# Patient Record
Sex: Female | Born: 1955 | Race: White | Hispanic: No | Marital: Married | Smoking: Former smoker
Health system: Southern US, Community
[De-identification: ages and names within clinical notes are randomized; demographics above are authoritative.]

## PROBLEM LIST (undated history)

## (undated) DIAGNOSIS — I10 Essential (primary) hypertension: Secondary | ICD-10-CM

## (undated) DIAGNOSIS — M1990 Unspecified osteoarthritis, unspecified site: Secondary | ICD-10-CM

## (undated) DIAGNOSIS — C801 Malignant (primary) neoplasm, unspecified: Secondary | ICD-10-CM

## (undated) HISTORY — DX: Essential (primary) hypertension: I10

## (undated) HISTORY — PX: APPENDECTOMY: SHX54

## (undated) HISTORY — DX: Unspecified osteoarthritis, unspecified site: M19.90

---

## 2003-11-27 ENCOUNTER — Ambulatory Visit: Payer: Self-pay | Admitting: Internal Medicine

## 2005-03-08 ENCOUNTER — Ambulatory Visit: Payer: Self-pay | Admitting: Internal Medicine

## 2005-05-04 ENCOUNTER — Ambulatory Visit: Payer: Self-pay | Admitting: Gastroenterology

## 2008-11-14 ENCOUNTER — Ambulatory Visit: Payer: Self-pay | Admitting: Internal Medicine

## 2012-09-25 ENCOUNTER — Emergency Department: Payer: Self-pay | Admitting: Emergency Medicine

## 2012-09-25 LAB — URINALYSIS, COMPLETE
Bacteria: NONE SEEN
Bilirubin,UR: NEGATIVE
Blood: NEGATIVE
Glucose,UR: NEGATIVE mg/dL (ref 0–75)
Ketone: NEGATIVE
Nitrite: NEGATIVE
Ph: 6 (ref 4.5–8.0)
Protein: NEGATIVE
Specific Gravity: 1.008 (ref 1.003–1.030)
WBC UR: NONE SEEN /HPF (ref 0–5)

## 2013-06-27 DIAGNOSIS — R110 Nausea: Secondary | ICD-10-CM | POA: Insufficient documentation

## 2013-06-27 DIAGNOSIS — R112 Nausea with vomiting, unspecified: Secondary | ICD-10-CM | POA: Insufficient documentation

## 2013-07-18 ENCOUNTER — Ambulatory Visit: Payer: Self-pay | Admitting: Nurse Practitioner

## 2013-07-27 ENCOUNTER — Ambulatory Visit: Payer: Self-pay | Admitting: Nurse Practitioner

## 2017-01-14 ENCOUNTER — Other Ambulatory Visit: Payer: Self-pay | Admitting: Nurse Practitioner

## 2017-01-14 DIAGNOSIS — Z1231 Encounter for screening mammogram for malignant neoplasm of breast: Secondary | ICD-10-CM

## 2017-02-18 DIAGNOSIS — R7402 Elevation of levels of lactic acid dehydrogenase (LDH): Secondary | ICD-10-CM | POA: Insufficient documentation

## 2018-08-02 DIAGNOSIS — M5136 Other intervertebral disc degeneration, lumbar region: Secondary | ICD-10-CM | POA: Insufficient documentation

## 2018-08-02 DIAGNOSIS — M51369 Other intervertebral disc degeneration, lumbar region without mention of lumbar back pain or lower extremity pain: Secondary | ICD-10-CM | POA: Insufficient documentation

## 2018-09-07 ENCOUNTER — Other Ambulatory Visit: Payer: Self-pay | Admitting: Nurse Practitioner

## 2018-09-07 DIAGNOSIS — M5010 Cervical disc disorder with radiculopathy, unspecified cervical region: Secondary | ICD-10-CM

## 2018-09-20 ENCOUNTER — Ambulatory Visit: Payer: Self-pay

## 2019-01-05 HISTORY — PX: COLOSTOMY: SHX63

## 2019-05-24 ENCOUNTER — Ambulatory Visit: Payer: Self-pay | Attending: Internal Medicine

## 2019-05-24 DIAGNOSIS — Z23 Encounter for immunization: Secondary | ICD-10-CM

## 2019-05-24 NOTE — Progress Notes (Signed)
   Covid-19 Vaccination Clinic  Name:  Jeanette Allen    MRN: [id number] DOB: 01-03-1956  05/24/2019  Jeanette Allen was observed post Covid-19 immunization for 15 minutes without incident. She was provided with Vaccine Information Sheet and instruction to access the V-Safe system.   Jeanette Allen was instructed to call 911 with any severe reactions post vaccine: Marland Kitchen Difficulty breathing  . Swelling of face and throat  . A fast heartbeat  . A bad rash all over body  . Dizziness and weakness   Immunizations Administered    Name Date Dose VIS Date Route   Moderna COVID-19 Vaccine 05/24/2019  8:03 AM 0.5 mL 12/2018 Intramuscular   Manufacturer: Moderna   Lot: [id number]   [place][id number]

## 2019-06-21 ENCOUNTER — Ambulatory Visit: Payer: Medicaid Other | Attending: Internal Medicine

## 2019-06-21 DIAGNOSIS — Z23 Encounter for immunization: Secondary | ICD-10-CM

## 2019-06-21 NOTE — Progress Notes (Signed)
   Covid-19 Vaccination Clinic  Name:  Jeanette Allen    MRN: [id number] DOB: 1955-08-12  06/21/2019  [name] was observed post Covid-19 immunization for 15 minutes without incident. She was provided with Vaccine Information Sheet and instruction to access the V-Safe system.   [name] was instructed to call 911 with any severe reactions post vaccine: Marland Kitchen Difficulty breathing  . Swelling of face and throat  . A fast heartbeat  . A bad rash all over body  . Dizziness and weakness   Immunizations Administered    Name Date Dose VIS Date Route   Moderna COVID-19 Vaccine 06/21/2019  7:58 AM 0.5 mL 12/2018 Intramuscular   Manufacturer: Moderna   Lot: [id number]   [place]: [id number]

## 2019-08-13 DIAGNOSIS — B1710 Acute hepatitis C without hepatic coma: Secondary | ICD-10-CM | POA: Insufficient documentation

## 2019-08-26 ENCOUNTER — Other Ambulatory Visit: Payer: Self-pay | Admitting: General Surgery

## 2019-08-26 DIAGNOSIS — C569 Malignant neoplasm of unspecified ovary: Secondary | ICD-10-CM

## 2019-08-27 ENCOUNTER — Telehealth: Payer: Self-pay

## 2019-08-27 ENCOUNTER — Ambulatory Visit
Admission: RE | Admit: 2019-08-27 | Discharge: 2019-08-27 | Disposition: A | Discharge status: Home / Self Care | Payer: 59 | Source: Ambulatory Visit | Attending: Nurse Practitioner | Admitting: Nurse Practitioner

## 2019-08-27 ENCOUNTER — Other Ambulatory Visit: Payer: Self-pay

## 2019-08-27 DIAGNOSIS — R971 Elevated cancer antigen 125 [CA 125]: Secondary | ICD-10-CM

## 2019-08-27 DIAGNOSIS — R188 Other ascites: Secondary | ICD-10-CM

## 2019-08-27 LAB — BODY FLUID CELL COUNT WITH DIFFERENTIAL
Eos, Fluid: 0 %
Lymphs, Fluid: 33 %
Monocyte-Macrophage-Serous Fluid: 25 %
Neutrophil Count, Fluid: 42 %
Total Nucleated Cell Count, Fluid: 3035 cu mm

## 2019-08-27 LAB — LACTATE DEHYDROGENASE, PLEURAL OR PERITONEAL FLUID: LD, Fluid: 334 U/L — ABNORMAL HIGH (ref 3–23)

## 2019-08-27 LAB — PROTEIN, PLEURAL OR PERITONEAL FLUID: Total protein, fluid: 5 g/dL

## 2019-08-27 LAB — ALBUMIN, PLEURAL OR PERITONEAL FLUID: Albumin, Fluid: 2.8 g/dL

## 2019-08-27 LAB — GLUCOSE, PLEURAL OR PERITONEAL FLUID: Glucose, Fluid: 98 mg/dL

## 2019-08-27 NOTE — Telephone Encounter (Addendum)
Call placed to Jeanette Allen post paracentesis. 1272ml removed and sent for cytology. She reports her breathing is much improved. Hepatitis C noted. We arranged gyn oncology appointment for 8-25 at 1000.DIrections to the cancer center given. Went over what to expect during the consult. Provided my contact information for future needs/questions.

## 2019-08-27 NOTE — Procedures (Signed)
PROCEDURE SUMMARY:  Successful US guided paracentesis from RLQ.  Yielded 1250 mL of clear amber fluid.  No immediate complications.  Pt tolerated well.   Specimen was sent for labs.  EBL < 8mL  Ascencion Dike PA-C 08/27/2019 3:22 PM

## 2019-08-27 NOTE — Telephone Encounter (Signed)
Received referral from Dr. Bary Castilla for elevated CA 125. CT scan and [place] has been performed. We will obtain this and get it uploaded to our system. Spoke with Dr. Fransisca Connors and Ms. Gettinger this am. She will have a paracentesis for ascites/cytology today. She reports shortness of breath. If paracentesis does not improve breathing we will obtain a chest CT. I will follow up with her later today following paracentesis. Plan for clinic visit 8/25.

## 2019-08-28 ENCOUNTER — Ambulatory Visit
Admission: RE | Admit: 2019-08-28 | Discharge: 2019-08-28 | Disposition: A | Discharge status: Home / Self Care | Payer: Self-pay | Source: Ambulatory Visit | Attending: Nurse Practitioner | Admitting: Nurse Practitioner

## 2019-08-28 ENCOUNTER — Other Ambulatory Visit: Payer: Self-pay

## 2019-08-28 DIAGNOSIS — R971 Elevated cancer antigen 125 [CA 125]: Secondary | ICD-10-CM

## 2019-08-28 DIAGNOSIS — R188 Other ascites: Secondary | ICD-10-CM

## 2019-08-29 ENCOUNTER — Other Ambulatory Visit: Payer: Self-pay

## 2019-08-29 ENCOUNTER — Inpatient Hospital Stay: Payer: 59 | Attending: Obstetrics and Gynecology | Admitting: Obstetrics and Gynecology

## 2019-08-29 ENCOUNTER — Inpatient Hospital Stay: Payer: 59

## 2019-08-29 VITALS — BP 141/89 | HR 104 | Temp 97.8°F | Resp 20 | Wt 160.4 lb

## 2019-08-29 DIAGNOSIS — R188 Other ascites: Secondary | ICD-10-CM | POA: Diagnosis not present

## 2019-08-29 DIAGNOSIS — R971 Elevated cancer antigen 125 [CA 125]: Secondary | ICD-10-CM

## 2019-08-29 DIAGNOSIS — I10 Essential (primary) hypertension: Secondary | ICD-10-CM | POA: Insufficient documentation

## 2019-08-29 DIAGNOSIS — R140 Abdominal distension (gaseous): Secondary | ICD-10-CM | POA: Diagnosis not present

## 2019-08-29 DIAGNOSIS — B1710 Acute hepatitis C without hepatic coma: Secondary | ICD-10-CM

## 2019-08-29 DIAGNOSIS — I2510 Atherosclerotic heart disease of native coronary artery without angina pectoris: Secondary | ICD-10-CM | POA: Insufficient documentation

## 2019-08-29 DIAGNOSIS — B1920 Unspecified viral hepatitis C without hepatic coma: Secondary | ICD-10-CM | POA: Diagnosis not present

## 2019-08-29 DIAGNOSIS — K219 Gastro-esophageal reflux disease without esophagitis: Secondary | ICD-10-CM | POA: Insufficient documentation

## 2019-08-29 DIAGNOSIS — R6881 Early satiety: Secondary | ICD-10-CM | POA: Diagnosis not present

## 2019-08-29 DIAGNOSIS — E785 Hyperlipidemia, unspecified: Secondary | ICD-10-CM | POA: Insufficient documentation

## 2019-08-29 DIAGNOSIS — E559 Vitamin D deficiency, unspecified: Secondary | ICD-10-CM | POA: Insufficient documentation

## 2019-08-29 DIAGNOSIS — F32A Depression, unspecified: Secondary | ICD-10-CM | POA: Insufficient documentation

## 2019-08-29 DIAGNOSIS — E538 Deficiency of other specified B group vitamins: Secondary | ICD-10-CM | POA: Insufficient documentation

## 2019-08-29 DIAGNOSIS — Z720 Tobacco use: Secondary | ICD-10-CM | POA: Insufficient documentation

## 2019-08-29 DIAGNOSIS — R599 Enlarged lymph nodes, unspecified: Secondary | ICD-10-CM

## 2019-08-29 DIAGNOSIS — I349 Nonrheumatic mitral valve disorder, unspecified: Secondary | ICD-10-CM | POA: Insufficient documentation

## 2019-08-29 LAB — PH, BODY FLUID: pH, Body Fluid: 7.4

## 2019-08-29 LAB — APTT: aPTT: 30 seconds (ref 24–36)

## 2019-08-29 LAB — PROTIME-INR
INR: 1 (ref 0.8–1.2)
Prothrombin Time: 12.9 seconds (ref 11.4–15.2)

## 2019-08-29 NOTE — Progress Notes (Signed)
Patient states that she is having really bad burping spells and also when she takes a deep breathe she is having a gurgling feeling on the left side of stomach. Patient also stated that she is getting full fast.

## 2019-08-29 NOTE — Progress Notes (Addendum)
Gynecologic Oncology Consult Visit   Referring Provider: Dr. Bary Castilla  Chief Concern: elevated CA125 and ascites  Subjective:  Jeanette Allen is a 64 y.o. female who is seen in consultation from Dr. Charlynn Grimes and Bary Castilla for elevated CA125 and ascites. She has a medical history significant for Hepatitis C (ectopic pregnancy in 1990 requiring transfusion).   She presented with abdominal pain for 2-3 weeks, daily nausea (episode of emesis of CT contrast). She also has early satiety and abdominal bloating.   .  08/20/2019 US Abdomen  Cirrhotic liver with small amount of perihepatic ascites.     Multiple peripancreatic and perihepatic hypoechoic nodules, the largest of which measures 2.0 cm at the inferior liver edge. Recommend CT for further evaluation.    Please see below for data measurements:    Liver: 15.0 cm     Common hepatic duct: 0.20 cm  Proximal common bile duct: 0.26 cm  Distal common bile duct: 0.42 cm    Gallbladder wall: 0.47 cm   Sonographic Murphy's Sign: negative  Pericholecystic fluid visualized: no    Right kidney: 10.1 cm   Left kidney: 10.9 cm     Aorta: not well visualized  Inferior vena cava: not well visualized    Spleen: 9.6 cm   [facility] RAD    US Abdomen Complete (08/20/2019 7:28 PM EDT)  Narrative Performed At  EXAM: US ABDOMEN COMPLETE  DATE: 08/20/2019 7:28 PM  ACCESSION: [id number]  DICTATED: 08/20/2019 7:30 PM  INTERPRETATION LOCATION: [facility]    CLINICAL INDICATION: 64 years old Female with reported h/o cirrhosis, abd distention     TECHNIQUE: Static and cine images of the complete abdomen were performed.    COMPARISON: None    FINDINGS:     LIVER: The liver is increased echogenicity with nodular contour. No focal hepatic lesions. No intrahepatic biliary ductal dilatation. The common bile duct is normal in caliber. Small amount of perihepatic ascites.    GALLBLADDER: The gallbladder is  partially contracted with thickening of the gallbladder wall. Sonographic Percell Miller sign is negative. No pericholecystic fluid.     PANCREAS: Visualized portion is unremarkable.    SPLEEN: Normal in size and echotexture.    KIDNEYS: Normal in size and echotexture. No solid masses or calculi. No hydronephrosis.    VESSELS: Not well visualized.     OTHER: Multiple peripancreatic and perihepatic hypoechoic nodules. The largest of which measures 2.0 x 1.3 x 1.4 cm at the inferior edge of the liver.        08/22/2019 The patient underwent a contrast-enhanced CT. This described possibility of gas around the gallbladder. Cholecystitis is a consideration. In light of the ultrasound 2 days earlier I think this is unlikely. Extensive abdominal and pelvic ascites. No retroperitoneal masses. No evidence of an acute inflammatory process. Suspected uterine fibroid. No free air. We do not have the radiology report.    08/23/2019 She saw Dr. Bary Castilla for evaluation given gallbladder findings.   Labs: elevated hepatitis C virus antibody. Liver function with elevated transaminases. Normal total bilirubin, albumin and electrolytes. Normal renal function. CBC showed a hemoglobin of 15.6 with an MCV of 95, white blood cell count of 6500 with normal differential. Hemoglobin A1c 5.5.  CA125= 1228; CEA= 2.1; alpha-fetoprotein = 2,2, and CA19-9 <2.    08/27/2019 Paracentesis total of approximately 1250 mL of clear, amber colored fluid was Removed. LDH 334; ANC42; total nucleated cell 3,035; No WBC; No organisms; No growth; Albumin 2.8; total protein 5.0; Glucose 98; pH  pending.  Cytology still in process.   She presents today for evaluation.   COVID vaccination 05/24/2019 and 06/21/2019  Problem List: Patient Active Problem List   Diagnosis Date Noted  . Hepatitis C infection 08/29/2019  . CAD (coronary artery disease) 08/29/2019  . Depression 08/29/2019  . Esophageal reflux 08/29/2019  .  Hyperlipidemia 08/29/2019  . Hypertension 08/29/2019  . Nonrheumatic mitral valve disorder 08/29/2019  . Vitamin B12 deficiency 08/29/2019  . Vitamin D deficiency 08/29/2019  . Tobacco abuse 08/29/2019    Past Medical History: Past Medical History:  Diagnosis Date  . Arthritis   . Hypertension     Past Surgical History: Past Surgical History:  Procedure Laterality Date  . APPENDECTOMY    colonoscopy 2011 Tubal pregnancy 1990 Tubal reversal  Past Gynecologic History:  Menarche: age 7 Last menstrual period: age 38   OB History:  OB History  Gravida Para Term Preterm AB Living  4 3     1     SAB TAB Ectopic Multiple Live Births      1        # Outcome Date GA Lbr Len/2nd Weight Sex Delivery Anes PTL Lv  4 Ectopic           3 Para           2 Para           1 Para             Family History: Family History  Problem Relation Age of Onset  . Diabetes Maternal Grandmother   . Hypertension Maternal Grandmother   . Stroke Maternal Grandmother     Social History: Social History   Socioeconomic History  . Marital status: Married    Spouse name: Not on file  . Number of children: Not on file  . Years of education: Not on file  . Highest education level: Not on file  Occupational History  . Not on file  Tobacco Use  . Smoking status: Current Every Day Smoker  . Smokeless tobacco: Never Used  Vaping Use  . Vaping Use: Never used  Substance and Sexual Activity  . Alcohol use: Not Currently  . Drug use: Not on file  . Sexual activity: Yes  Other Topics Concern  . Not on file  Social History Narrative  . Not on file   Social Determinants of Health   Financial Resource Strain:   . Difficulty of Paying Living Expenses: Not on file  Food Insecurity:   . Worried About Charity fundraiser in the Last Year: Not on file  . Ran Out of Food in the Last Year: Not on file  Transportation Needs:   . Lack of Transportation (Medical): Not on file  . Lack of  Transportation (Non-Medical): Not on file  Physical Activity:   . Days of Exercise per Week: Not on file  . Minutes of Exercise per Session: Not on file  Stress:   . Feeling of Stress : Not on file  Social Connections:   . Frequency of Communication with Friends and Family: Not on file  . Frequency of Social Gatherings with Friends and Family: Not on file  . Attends Religious Services: Not on file  . Active Member of Clubs or Organizations: Not on file  . Attends Archivist Meetings: Not on file  . Marital Status: Not on file  Intimate Partner Violence:   . Fear of Current or Ex-Partner: Not on file  . Emotionally  Abused: Not on file  . Physically Abused: Not on file  . Sexually Abused: Not on file    Allergies: No Known Allergies  Current Medications: Current Outpatient Medications  Medication Sig Dispense Refill  . amoxicillin (AMOXIL) 500 MG tablet Take 500 mg by mouth 2 (two) times daily.    Marland Kitchen dicyclomine (BENTYL) 10 MG/5ML solution Take by mouth 4 (four) times daily -  before meals and at bedtime.    . ergocalciferol (VITAMIN D2) 1.25 MG (50000 UT) capsule Take by mouth.    Marland Kitchen FLUoxetine (PROZAC) 20 MG capsule Take by mouth.    . losartan (COZAAR) 50 MG tablet Take by mouth.    . ondansetron (ZOFRAN-ODT) 4 MG disintegrating tablet Take 4 mg by mouth every 8 (eight) hours as needed for nausea or vomiting.    . Vitamin D, Ergocalciferol, (DRISDOL) 1.25 MG (50000 UNIT) CAPS capsule Take 50,000 Units by mouth once a week.     No current facility-administered medications for this visit.    General: negative for fevers, changes in weight or night sweats Skin: negative for changes in moles or sores or rash Eyes: negative for changes in vision HEENT: negative for change in hearing, tinnitus, voice changes Pulmonary: negative for dyspnea, orthopnea, productive cough, wheezing Cardiac: negative for palpitations, pain Gastrointestinal: negative for constipation,  diarrhea, hematemesis, hematochezia. Positive for nausea Genitourinary/Sexual: negative for dysuria, retention, hematuria, incontinence Ob/Gyn:  negative for abnormal bleeding, or pain Musculoskeletal: Positive for pain in arms and legs Hematology: negative for easy bruising, abnormal bleeding Neurologic/Psych: negative for headaches, seizures, paralysis, weakness, numbness   Objective:  Physical Examination:  BP (!) 141/89   Pulse (!) 104   Temp 97.8 F (36.6 C)   Resp 20   Wt 160 lb 6.4 oz (72.8 kg)   SpO2 100%    ECOG Performance Status: 1 - Symptomatic but completely ambulatory  GENERAL: Patient is a well appearing female in no acute distress HEENT:  PERRL, neck supple with midline trachea.  NODES:  No cervical, supraclavicular, axillary, or inguinal lymphadenopathy palpated.  LUNGS:  Clear to auscultation bilaterally.  No wheezes or rhonchi. HEART:  Regular rate and rhythm. No murmur appreciated. ABDOMEN: distended with obvious ascites; no obvious masses, unable to assess for hepatosplenomegaly. No obvious hernias.  MSK:  No focal spinal tenderness to palpation. Full range of motion bilaterally in the upper extremities. EXTREMITIES:  No peripheral edema.   SKIN:  Clear with no obvious rashes or skin changes.  NEURO:  Nonfocal. Well oriented.  Appropriate affect.  Pelvic: exam chaperoned by nurse;  Vulva: normal appearing vulva with no masses, tenderness or lesions; Vagina: normal vagina; Uterus: not grossly enlarged but limited exam due to ascites. BME: firm plaque like midline mass in the cul-de-sac measuring ~3 cm and firm, no adnexal mass appreciated; Cervix: no lesions, normal appearance; Rectal: not indicated and normal rectal, no masses  Lab Review Labs on site today: As per HPI                Radiologic Imaging: CT images reviewed with the patient. Requested report.     Assessment:  Jeanette Allen is a 64 y.o. female diagnosed with ascites, elevated  CA125, and cul-de-sac mass concerning for malignancy. However she also has a diagnosis of Hepatitis C and ascites may be due to liver disease.   Hepatitis C, elevated LFTs, normal bilirubin and albumin.   Medical co-morbidities complicating care: HTN and prior abdominal surgery and Hepatitis C.  Plan:  Problem List Items Addressed This Visit      Digestive   Hepatitis C infection   Relevant Orders   Ambulatory referral to Gastroenterology   Protime-INR    Other Visit Diagnoses    Elevated CA-125    -  Primary   Relevant Orders   Ambulatory referral to Gastroenterology   Protime-INR      We discussed the differential diagnosis. At this point more information is needed before a management recommendation can be given. We requested CT report. PT/PTT/INR ordered. Cytology pending, discussed with Pathology. Arrange for GI consultation for evaluation of Hepatitis C.   Suggested return to clinic in  1 week. Hopefully we will have more information and develop a management plan. .     All questions were answered to the patient's, her husband's and daughter's satisfaction.  A total of 80 minutes were spent with the patient/family today; >50% was spent in education, counseling and coordination of care for ascites, elevated CA125, and cul-de-sac mass.  Sanna Porcaro Gaetana Michaelis, MD    CC:  Danelle Berry, NP [address] [place],  [place] [address] [phone]  Dr. Job Founds

## 2019-08-30 ENCOUNTER — Telehealth (HOSPITAL_COMMUNITY): Payer: Self-pay | Admitting: Nurse Practitioner

## 2019-08-30 ENCOUNTER — Telehealth: Payer: Self-pay

## 2019-08-30 DIAGNOSIS — C763 Malignant neoplasm of pelvis: Secondary | ICD-10-CM

## 2019-08-30 LAB — BODY FLUID CULTURE
Culture: NO GROWTH
Gram Stain: NONE SEEN

## 2019-08-30 LAB — CYTOLOGY - NON PAP

## 2019-08-30 NOTE — Telephone Encounter (Signed)
Spoke to patient and her husband regarding pathology results which are consistent with high grade serous carcinoma. Will get Chest ct for staging. Not sufficient testing for ancillary testing. Jeanette Allen coordinating biopsy. Will send referral to medical oncology.

## 2019-08-30 NOTE — Telephone Encounter (Signed)
Probable areas of peritoneal and mesenteric adenopathy noted on CT read. Dr. Theora Gianotti would like a biopsy of this area. Order placed and invasive checklist faxed along with copy of CT report.

## 2019-08-31 ENCOUNTER — Telehealth: Payer: Self-pay

## 2019-08-31 ENCOUNTER — Other Ambulatory Visit: Payer: Self-pay | Admitting: Nurse Practitioner

## 2019-08-31 ENCOUNTER — Other Ambulatory Visit (HOSPITAL_COMMUNITY): Payer: Self-pay | Admitting: Nurse Practitioner

## 2019-08-31 DIAGNOSIS — C763 Malignant neoplasm of pelvis: Secondary | ICD-10-CM

## 2019-08-31 NOTE — Telephone Encounter (Signed)
Called and went over all upcoming appointments with Ms. Hulick. She has a CT guided biopsy scheduled, 09/07/19, at 1130 with arrival [facility] the [facility] mall at 1030. Reviewed again the process for biopsy and to bring a driver. Notified that an IR nurse will also call her a day or so before with any additional instructions. Medical Oncology appointment has been arranged with Dr. Rogue Bussing 8/31 at 1400. She is aware this is at the cancer center on the second floor. GI has been scheduled for 09/03/19 at 1345 with Dr. Marius Ditch for her hepatitis C. She continues with nausea without vomiting. She has contacted her PCP office and they gave her permission to increase the frequency of her Zofran. Her shortness of breath has not returned since paracentesis. She does report some shortness of breath during the nausea episodes but says that it subsides as the nausea does. Introduced our symptom management clinic to her. She can be arranged an in-person visit or a virtual visit as needed to assist with her symptoms. All questions answered. Encouraged to call with any further needs/questions.

## 2019-09-03 ENCOUNTER — Ambulatory Visit (INDEPENDENT_AMBULATORY_CARE_PROVIDER_SITE_OTHER): Payer: 59 | Admitting: Gastroenterology

## 2019-09-03 ENCOUNTER — Encounter: Payer: Self-pay | Admitting: Gastroenterology

## 2019-09-03 ENCOUNTER — Other Ambulatory Visit: Payer: Self-pay

## 2019-09-03 VITALS — BP 122/76 | HR 120 | Temp 98.0°F | Wt 163.0 lb

## 2019-09-03 DIAGNOSIS — C786 Secondary malignant neoplasm of retroperitoneum and peritoneum: Secondary | ICD-10-CM | POA: Diagnosis not present

## 2019-09-03 DIAGNOSIS — K7469 Other cirrhosis of liver: Secondary | ICD-10-CM | POA: Diagnosis not present

## 2019-09-03 DIAGNOSIS — C801 Malignant (primary) neoplasm, unspecified: Secondary | ICD-10-CM | POA: Diagnosis not present

## 2019-09-03 DIAGNOSIS — R768 Other specified abnormal immunological findings in serum: Secondary | ICD-10-CM

## 2019-09-03 NOTE — Progress Notes (Signed)
Cephas Darby, MD [address]  [place]  [place], [place] [address]  Main: [phone]  Fax: [phone]    Gastroenterology Consultation  Referring Provider:     Danelle Berry, NP Primary Care Physician:  Danelle Berry, NP Primary Gastroenterologist:  Dr. Cephas Darby Reason for Consultation:     Hepatitis C, cirrhosis        HPI:   Jeanette Allen is a 64 y.o. female referred by Dr. Danelle Berry, NP  for consultation & management of hep C, cirrhosis.  Patient is newly diagnosed with hepatitis C.  She was urgently seen by her PCP in April 2021 secondary to abdominal distention, fatigue.  She underwent ultrasound abdomen which revealed multiple peripancreatic and perihepatic hypoechoic nodules measuring up to 2 cm, ascites and cirrhosis.  Her HCV antibody was positive, HCV RNA quantitative was positive.  She did have mildly elevated AST and ALT.  Normal CBC, TSH, HbA1c. Subsequently, she underwent CT scan which revealed severe ascites and retroperitoneal masses as well as was gas around the gallbladder concerning for possible cholecystitis.  Patient was evaluated by Dr. Bary Castilla on 08/24/2019, checked for CEA, AFP, CA 19-9 which was normal, CA-125 was 1228. She underwent therapeutic paracentesis on 08/27/2019 with fluid analysis which confirmed high-grade serous carcinoma on cytology.  Total protein, albumin levels were normal.  There was no evidence of SBP.  Fluid LDH was elevated  Patient reports mild abdominal discomfort, in lower abdomen, diffuse abdominal discomfort, nausea She denies constipation, rectal bleeding, melena  Patient is referred to GI for management of hep C  She has an appointment with oncologist, Dr. Rogue Bussing tomorrow Patient does not smoke or drink alcohol She reports she received a blood transfusion when she had severe bleeding from ectopic pregnancy in 1990  NSAIDs: None  Antiplts/Anticoagulants/Anti thrombotics: None  GI Procedures:  Reports having had a colonoscopy around age 6, reportedly normal  Past Medical History:  Diagnosis Date  . Arthritis   . Hypertension     Past Surgical History:  Procedure Laterality Date  . APPENDECTOMY      Current Outpatient Medications:  .  dicyclomine (BENTYL) 10 MG/5ML solution, Take by mouth 4 (four) times daily -  before meals and at bedtime., Disp: , Rfl:  .  ergocalciferol (VITAMIN D2) 1.25 MG (50000 UT) capsule, Take by mouth., Disp: , Rfl:  .  FLUoxetine (PROZAC) 20 MG capsule, Take by mouth., Disp: , Rfl:  .  losartan (COZAAR) 50 MG tablet, Take by mouth., Disp: , Rfl:  .  ondansetron (ZOFRAN-ODT) 4 MG disintegrating tablet, Take 4 mg by mouth every 8 (eight) hours as needed for nausea or vomiting., Disp: , Rfl:    Family History  Problem Relation Age of Onset  . Diabetes Maternal Grandmother   . Hypertension Maternal Grandmother   . Stroke Maternal Grandmother      Social History   Tobacco Use  . Smoking status: Current Every Day Smoker  . Smokeless tobacco: Never Used  Vaping Use  . Vaping Use: Never used  Substance Use Topics  . Alcohol use: Not Currently  . Drug use: Not on file    Allergies as of 09/03/2019  . (No Known Allergies)    Review of Systems:    All systems reviewed and negative except where noted in HPI.   Physical Exam:  BP 122/76 (BP Location: Left Arm, Patient Position: Sitting)   Pulse (!) 120   Temp 98 F (36.7  C) (Oral)   Wt 163 lb (73.9 kg)  No LMP recorded.  General:   Alert,  Well-developed, well-nourished, pleasant and cooperative in NAD Head:  Normocephalic and atraumatic. Eyes:  Sclera clear, no icterus.   Conjunctiva pink. Ears:  Normal auditory acuity. Nose:  No deformity, discharge, or lesions. Mouth:  No deformity or lesions,oropharynx pink & moist. Neck:  Supple; no masses or thyromegaly. Lungs:  Respirations even and unlabored.  Clear throughout to auscultation.   No wheezes, crackles, or rhonchi. No acute  distress. Heart:  Regular rate and rhythm; no murmurs, clicks, rubs, or gallops. Abdomen:  Normal bowel sounds. Soft, mild diffuse lower abdominal tenderness, grossly distended, tympanic to percussion, without masses, hepatosplenomegaly or hernias noted.  No guarding or rebound tenderness.   Rectal: Not performed Msk:  Symmetrical without gross deformities. Good, equal movement & strength bilaterally. Pulses:  Normal pulses noted. Extremities:  No clubbing or edema.  No cyanosis. Neurologic:  Alert and oriented x3;  grossly normal neurologically. Skin:  Intact without significant lesions or rashes. No jaundice. Psych:  Alert and cooperative. Normal mood and affect.  Imaging Studies: Reviewed  Assessment and Plan:   Jeanette Allen is a 64 y.o. 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

## 2019-09-04 ENCOUNTER — Inpatient Hospital Stay: Payer: 59

## 2019-09-04 ENCOUNTER — Inpatient Hospital Stay (HOSPITAL_BASED_OUTPATIENT_CLINIC_OR_DEPARTMENT_OTHER): Payer: 59 | Admitting: Internal Medicine

## 2019-09-04 ENCOUNTER — Encounter: Payer: Self-pay | Admitting: Internal Medicine

## 2019-09-04 VITALS — BP 122/71 | HR 87 | Temp 96.9°F | Resp 16 | Ht 64.0 in | Wt 163.0 lb

## 2019-09-04 DIAGNOSIS — C55 Malignant neoplasm of uterus, part unspecified: Secondary | ICD-10-CM | POA: Insufficient documentation

## 2019-09-04 DIAGNOSIS — R188 Other ascites: Secondary | ICD-10-CM | POA: Diagnosis not present

## 2019-09-04 DIAGNOSIS — C579 Malignant neoplasm of female genital organ, unspecified: Secondary | ICD-10-CM | POA: Diagnosis not present

## 2019-09-04 DIAGNOSIS — C801 Malignant (primary) neoplasm, unspecified: Secondary | ICD-10-CM

## 2019-09-04 DIAGNOSIS — Z7189 Other specified counseling: Secondary | ICD-10-CM | POA: Diagnosis not present

## 2019-09-04 MED ORDER — PROCHLORPERAZINE MALEATE 10 MG PO TABS: 10.0000 mg | ORAL_TABLET | Freq: Four times a day (QID) | ORAL | 1 refills | Status: DC | PRN

## 2019-09-04 MED ORDER — ONDANSETRON HCL 8 MG PO TABS: ORAL_TABLET | ORAL | 1 refills | Status: DC

## 2019-09-04 NOTE — Assessment & Plan Note (Addendum)
#  High-grade serous carcinoma of gynecologic origin based on cytology [ovarian/fallopian tube or primary peritoneal; less likely endometrial].  However, no ovarian/endometrial masses noted.  Agree with biopsy of the left abdominal/mesenteric mass- [plan on September 3]-for more tissue/NGS.  Recommend a PET scan for further evaluation as primary is not clear at this time.  Ca1 2 05-1226  #Discussed with the patient and husband-that in general would recommend neoadjuvant chemotherapy x3 cycles-which is typically followed by TAH/BSO.  This is usually followed by 3 more cycles of chemotherapy.  Recommend carbotaxol every 3 weeks.  Will discuss regarding Avastin with gynecology oncology [facility] EGD].  # Discussed the potential side effects including but not limited to-increasing fatigue, nausea vomiting, diarrhea, hair loss, sores in the mouth, increase risk of infection and also neuropathy.   #Cirrhosis/hepatitis-awaiting EGD/endoscopy with Dr. Marius Ditch on 9/04.  Awaiting hepatitis C work-up/genotyping.  Clinically do not suspect decompensated liver disease causing ascites.  #Ascites likely malignant-based on cytology.  Would recommend repeat paracentesis/at the time of biopsy; discussed with Roderic Ovens.  #Genetic testing-we will make a referral to genetics given-high-grade serous of gynecologic origin.   # Thank you Dr.Secord for allowing me to participate in the care of your pleasant patient. Please do not hesitate to contact me with questions or concerns in the interim.  Discussed with Roderic Ovens; Beckey Rutter NP.  # DISPOSITION: # cancel CT chest # PET scan ASAP # genetic referral-  # chemo education- Carbo-taxol next week # Follow up later next week- MD; labs- CBC/CMP/ca-125- carbo-taxol; Dr.B

## 2019-09-04 NOTE — Progress Notes (Signed)
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 [phone] [Dr.Byrnett];   #Hepatitis C/ectopic pregnancy/PRBC transfusion 1995-untreated.[Dr.Vanga]  #    # NGS/MOLECULAR TESTS:    # PALLIATIVE CARE EVALUATION:  # PAIN MANAGEMENT:    DIAGNOSIS:   STAGE:         ;  GOALS:  CURRENT/MOST RECENT THERAPY :     Gynecologic cancer ([place])  09/04/2019 Initial Diagnosis   Gynecologic cancer ([place])   09/04/2019 -  Chemotherapy   The patient had dexamethasone (DECADRON) 4 MG tablet, 8 mg, Oral, Daily, 0 of 1 cycle, Start date: --, End date: -- PALONOSETRON HCL INJECTION 0.25 MG/5ML, 0.25 mg, Intravenous,  Once, 0 of 6 cycles CARBOplatin (PARAPLATIN) in sodium chloride 0.9 % 100 mL chemo infusion, , Intravenous,  Once, 0 of 6 cycles FOSAPREPITANT IV INFUSION 150 MG, 150 mg, Intravenous,  Once, 0 of 6 cycles PACLitaxel (TAXOL) 318 mg in sodium chloride 0.9 % 500 mL chemo infusion (> 80mg /m2), 175 mg/m2, Intravenous,  Once, 0 of 6 cycles  for chemotherapy treatment.       HISTORY OF PRESENTING ILLNESS:  Jeanette Allen 64 y.o.  female with history of hepatitis C [blood transfusion-untreated]-has been referred to [place] for further evaluation for high-grade serous carcinoma.  Patient noted to have loss of appetite abdominal distention; and also ongoing nausea.  She had a CT scan that showed mesenteric masses/lymph nodes-x2-4 to 6 cm in size.  She was referred to Dr. Bary Castilla for further evaluation.  She also had acetic fluid-which was drained; cytology positive for  malignancy.  Patient had a Ca1 2 5 that was elevated at 1228.   Patient complains of ongoing abdominal distention.  Also complains of nausea.  Poor appetite.  No vomiting.   Review of Systems  Constitutional: Positive for malaise/fatigue. Negative for chills, diaphoresis, fever and weight loss.  HENT: Negative for nosebleeds and sore throat.   Eyes: Negative for double vision.  Respiratory: Negative for cough, hemoptysis, sputum production, shortness of breath and wheezing.   Cardiovascular: Negative for chest pain, palpitations, orthopnea and leg swelling.  Gastrointestinal: Positive for abdominal pain and nausea. Negative for blood in stool, constipation, diarrhea, heartburn, melena and vomiting.  Genitourinary: Negative for dysuria, frequency and urgency.  Musculoskeletal: Negative for back pain and joint pain.  Skin: Negative.  Negative for itching and rash.  Neurological: Negative for dizziness, tingling, focal weakness, weakness and headaches.  Endo/Heme/Allergies: Does not bruise/bleed easily.  Psychiatric/Behavioral: Negative for depression. The patient is not nervous/anxious and does not have insomnia.      MEDICAL HISTORY:  Past Medical History:  Diagnosis Date  . Arthritis   . Hypertension     SURGICAL HISTORY: Past Surgical History:  Procedure Laterality Date  . APPENDECTOMY      SOCIAL HISTORY: Social History   Socioeconomic History  . Marital status: Married    Spouse name: Not on file  . Number of children: Not on file  . Years of education: Not on file  . Highest education level: Not on file  Occupational History  . Not on file  Tobacco Use  . Smoking status: Current Every Day Smoker  . Smokeless tobacco: Never Used  Vaping Use  . Vaping Use: Never used  Substance and Sexual Activity  . Alcohol use: Not Currently  . Drug use: Not on file  . Sexual activity: Yes  Other Topics Concern  . Not on file  Social History Narrative   Lives with husband;  near [place]. 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 Difficulty of Paying Living Expenses: Not on file  Food Insecurity:   . Worried About Charity fundraiser in the Last Year: Not on file  . Ran Out of Food in the Last Year: Not on file  Transportation Needs:   . Lack of Transportation (Medical): Not on file  . Lack of Transportation (Non-Medical): Not on file  Physical Activity:   . Days of Exercise per Week: Not on file  . Minutes of Exercise per Session: Not on file  Stress:   . Feeling of Stress : Not on file  Social Connections:   . Frequency of Communication with Friends and Family: Not on file  . Frequency of Social Gatherings with Friends and Family: Not on file  . Attends Religious Services: Not on file  . Active Member of Clubs or Organizations: Not on file  . Attends Archivist Meetings: Not on file  . Marital Status: Not on file  Intimate Partner Violence:   . Fear of Current or Ex-Partner: Not on file  . Emotionally Abused: Not on file  . Physically Abused: Not on file  . Sexually Abused: Not on file    FAMILY HISTORY: Family History  Problem Relation Age of Onset  . Diabetes Maternal Grandmother   . Hypertension Maternal Grandmother   . Stroke Maternal Grandmother     ALLERGIES:  has No Known Allergies.  MEDICATIONS:  Current Outpatient Medications  Medication Sig Dispense Refill  . dicyclomine (BENTYL) 10 MG/5ML solution Take by mouth 4 (four) times daily -  before meals and at bedtime.    . ergocalciferol (VITAMIN D2) 1.25 MG (50000 UT) capsule Take by mouth.    Marland Kitchen FLUoxetine (PROZAC) 20 MG capsule Take by mouth.    . losartan (COZAAR) 50 MG tablet Take by mouth.    . ondansetron (ZOFRAN-ODT) 4 MG disintegrating tablet Take 4 mg by mouth every 8 (eight) hours as needed for nausea or vomiting.    .  Pitavastatin Calcium (LIVALO) 4 MG TABS Take 4 mg by mouth daily.    . ondansetron (ZOFRAN) 8 MG tablet One pill every 8 hours as needed for nausea/vomitting. 60 tablet 1  . prochlorperazine (COMPAZINE) 10 MG tablet Take 1 tablet (10 mg total) by mouth every 6 (six) hours as needed for nausea or vomiting. 40 tablet 1   No current facility-administered medications for this visit.      Marland Kitchen  PHYSICAL EXAMINATION: ECOG PERFORMANCE STATUS: 1 - Symptomatic but completely ambulatory  Vitals:   09/04/19 1338  BP: 122/71  Pulse: 87  Resp: 16  Temp: (!) 96.9 F (36.1 C)  SpO2: 100%   Filed Weights   09/04/19 1338  Weight: 163 lb (73.9 kg)    Physical Exam HENT:     Head: Normocephalic and atraumatic.     Mouth/Throat:     Pharynx: No oropharyngeal exudate.  Eyes:  Pupils: Pupils are equal, round, and reactive to light.  Cardiovascular:     Rate and Rhythm: Normal rate and regular rhythm.  Pulmonary:     Effort: Pulmonary effort is normal. No respiratory distress.     Breath sounds: Normal breath sounds. No wheezing.  Abdominal:     General: Bowel sounds are normal. There is distension.     Palpations: Abdomen is soft. There is no mass.     Tenderness: There is no abdominal tenderness. There is no guarding or rebound.  Musculoskeletal:        General: No tenderness. Normal range of motion.     Cervical back: Normal range of motion and neck supple.  Skin:    General: Skin is warm.  Neurological:     Mental Status: She is alert and oriented to person, place, and time.  Psychiatric:        Mood and Affect: Affect normal.      LABORATORY DATA:  I have reviewed the data as listed No results found for: WBC, HGB, HCT, MCV, PLT No results for input(s): NA, K, CL, CO2, GLUCOSE, BUN, CREATININE, CALCIUM, GFRNONAA, GFRAA, PROT, ALBUMIN, AST, ALT, ALKPHOS, BILITOT, BILIDIR, IBILI in the last 8760 hours.  RADIOGRAPHIC STUDIES: I have personally reviewed the radiological images  as listed and agreed with the findings in the report. US Paracentesis  Result Date: 08/27/2019 INDICATION: Abdominal distention and discomfort. Shortness of breath. Ascites. Request for diagnostic and therapeutic paracentesis. EXAM: ULTRASOUND GUIDED RIGHT LOWER QUADRANT PARACENTESIS MEDICATIONS: 1% plain lidocaine, 5 mL COMPLICATIONS: None immediate. PROCEDURE: Informed written consent was obtained from the patient after a discussion of the risks, benefits and alternatives to treatment. A timeout was performed prior to the initiation of the procedure. Initial ultrasound scanning demonstrates a small to moderate amount of ascites within the right lower abdominal quadrant. The right lower abdomen was prepped and draped in the usual sterile fashion. 1% lidocaine was used for local anesthesia. Following this, a 6 Fr Safe-T-Centesis catheter was introduced. An ultrasound image was saved for documentation purposes. The paracentesis was performed. The catheter was removed and a dressing was applied. The patient tolerated the procedure well without immediate post procedural complication. FINDINGS: A total of approximately 1250 mL of clear, amber colored fluid was removed. Samples were sent to the laboratory as requested by the clinical team. IMPRESSION: Successful ultrasound-guided paracentesis yielding 1250 mL of peritoneal fluid. Read by: Ascencion Dike PA-C Electronically Signed   By: Jerilynn Mages.  Shick M.D.   On: 08/27/2019 15:23    ASSESSMENT & PLAN:   Gynecologic cancer [facility]) #High-grade serous carcinoma of gynecologic origin based on cytology [ovarian/fallopian tube or primary peritoneal; less likely endometrial].  However, no ovarian/endometrial masses noted.  Agree with biopsy of the left abdominal/mesenteric mass- [plan on September 3]-for more tissue/NGS.  Recommend a PET scan for further evaluation as primary is not clear at this time.  Ca1 2 05-1226  #Discussed with the patient and husband-that in general  would recommend neoadjuvant chemotherapy x3 cycles-which is typically followed by TAH/BSO.  This is usually followed by 3 more cycles of chemotherapy.  Recommend carbotaxol every 3 weeks.  Will discuss regarding Avastin with gynecology oncology [facility] EGD].  # Discussed the potential side effects including but not limited to-increasing fatigue, nausea vomiting, diarrhea, hair loss, sores in the mouth, increase risk of infection and also neuropathy.   #Cirrhosis/hepatitis-awaiting EGD/endoscopy with Dr. Marius Ditch on 9/04.  Awaiting hepatitis C work-up/genotyping.  Clinically do not suspect decompensated liver  disease causing ascites.  #Ascites likely malignant-based on cytology.  Would recommend repeat paracentesis/at the time of biopsy; discussed with Roderic Ovens.  #Genetic testing-we will make a referral to genetics given-high-grade serous of gynecologic origin.   # Thank you Dr.Secord for allowing me to participate in the care of your pleasant patient. Please do not hesitate to contact me with questions or concerns in the interim.  Discussed with Roderic Ovens; Beckey Rutter NP.  # DISPOSITION: # cancel CT chest # PET scan ASAP # genetic referral-  # chemo education- Carbo-taxol next week # Follow up later next week- MD; labs- CBC/CMP/ca-125- carbo-taxol; Dr.B  All questions were answered. The patient knows to call the clinic with any problems, questions or concerns.   Cammie Sickle, MD 09/04/2019 4:26 PM

## 2019-09-04 NOTE — Progress Notes (Signed)
START ON PATHWAY REGIMEN - Ovarian     A cycle is every 21 days:     Paclitaxel      Carboplatin   **Always confirm dose/schedule in your pharmacy ordering system**  Patient Characteristics: Preoperative or Nonsurgical Candidate (Clinical Staging), Newly Diagnosed, Neoadjuvant Therapy followed by Surgery BRCA Mutation Status: Awaiting Test Results Therapeutic Status: Preoperative or Nonsurgical Candidate (Clinical Staging) AJCC T Category: cT3c AJCC 8 Stage Grouping: IIIC AJCC N Category: cN1b AJCC M Category: cM0 Therapy Plan: Neoadjuvant Therapy followed by Surgery Intent of Therapy: Curative Intent, Discussed with Patient

## 2019-09-05 ENCOUNTER — Telehealth: Payer: Self-pay | Admitting: Internal Medicine

## 2019-09-05 ENCOUNTER — Telehealth: Payer: Self-pay

## 2019-09-05 ENCOUNTER — Telehealth: Payer: Self-pay | Admitting: *Deleted

## 2019-09-05 DIAGNOSIS — C763 Malignant neoplasm of pelvis: Secondary | ICD-10-CM

## 2019-09-05 DIAGNOSIS — R188 Other ascites: Secondary | ICD-10-CM

## 2019-09-05 NOTE — Telephone Encounter (Signed)
For recurring ascites, attempting to obtain repeat paracentesis on 9/3 whil she is already here for CT guided biopsy. Orders placed and checklist faxed.

## 2019-09-05 NOTE — Telephone Encounter (Signed)
Spoke with daughter, Alleen Borne regarding patient's diagnosis/further diagnostics/treatment plan at length.  Discussed the tentative plan to start carbotaxol chemotherapy.  Will await further work-up, however.   Also discussed with Dr. Tollie Pizza.  We will hold off the port at this time.

## 2019-09-06 ENCOUNTER — Other Ambulatory Visit: Payer: Self-pay | Admitting: Radiology

## 2019-09-06 ENCOUNTER — Encounter: Payer: Self-pay | Admitting: Licensed Clinical Social Worker

## 2019-09-06 ENCOUNTER — Other Ambulatory Visit: Payer: Self-pay

## 2019-09-06 ENCOUNTER — Inpatient Hospital Stay: Payer: Medicare Other | Attending: Internal Medicine | Admitting: Licensed Clinical Social Worker

## 2019-09-06 ENCOUNTER — Inpatient Hospital Stay: Payer: Medicare Other

## 2019-09-06 DIAGNOSIS — B1920 Unspecified viral hepatitis C without hepatic coma: Secondary | ICD-10-CM | POA: Insufficient documentation

## 2019-09-06 DIAGNOSIS — Z79899 Other long term (current) drug therapy: Secondary | ICD-10-CM | POA: Insufficient documentation

## 2019-09-06 DIAGNOSIS — C786 Secondary malignant neoplasm of retroperitoneum and peritoneum: Secondary | ICD-10-CM | POA: Insufficient documentation

## 2019-09-06 DIAGNOSIS — E785 Hyperlipidemia, unspecified: Secondary | ICD-10-CM | POA: Insufficient documentation

## 2019-09-06 DIAGNOSIS — R188 Other ascites: Secondary | ICD-10-CM | POA: Insufficient documentation

## 2019-09-06 DIAGNOSIS — C801 Malignant (primary) neoplasm, unspecified: Secondary | ICD-10-CM | POA: Insufficient documentation

## 2019-09-06 DIAGNOSIS — K219 Gastro-esophageal reflux disease without esophagitis: Secondary | ICD-10-CM | POA: Insufficient documentation

## 2019-09-06 DIAGNOSIS — I10 Essential (primary) hypertension: Secondary | ICD-10-CM | POA: Insufficient documentation

## 2019-09-06 DIAGNOSIS — R971 Elevated cancer antigen 125 [CA 125]: Secondary | ICD-10-CM | POA: Insufficient documentation

## 2019-09-06 DIAGNOSIS — F17210 Nicotine dependence, cigarettes, uncomplicated: Secondary | ICD-10-CM | POA: Insufficient documentation

## 2019-09-06 DIAGNOSIS — Z5111 Encounter for antineoplastic chemotherapy: Secondary | ICD-10-CM | POA: Insufficient documentation

## 2019-09-06 DIAGNOSIS — M1990 Unspecified osteoarthritis, unspecified site: Secondary | ICD-10-CM | POA: Insufficient documentation

## 2019-09-06 DIAGNOSIS — Z1379 Encounter for other screening for genetic and chromosomal anomalies: Secondary | ICD-10-CM

## 2019-09-06 DIAGNOSIS — C579 Malignant neoplasm of female genital organ, unspecified: Secondary | ICD-10-CM

## 2019-09-06 DIAGNOSIS — F329 Major depressive disorder, single episode, unspecified: Secondary | ICD-10-CM | POA: Insufficient documentation

## 2019-09-06 LAB — HCV RNA QUANT RFLX ULTRA OR GENOTYP
HCV Quant Baseline: 496000 IU/mL
HCV log10: 5.695 log10 IU/mL

## 2019-09-06 LAB — HEPATITIS C GENOTYPE

## 2019-09-06 NOTE — Progress Notes (Signed)
REFERRING PROVIDER: Cammie Sickle, MD [place],  [place] [id number]  PRIMARY PROVIDER:  Danelle Berry, NP  PRIMARY REASON FOR VISIT:  1. Gynecologic cancer [facility])      HISTORY OF PRESENT ILLNESS:   Jeanette Allen, a 64 y.o. female, was seen for a [place] cancer genetics consultation at the request of Dr. Rogue Bussing due to a personal history of gynecological cancer.  Jeanette Allen presents to clinic today to discuss the possibility of a hereditary predisposition to cancer, genetic testing, and to further clarify her future cancer risks, as well as potential cancer risks for family members.   In 2021, at the age of 65, Jeanette Allen was diagnosed with high grade serous carcinoma of gynecologic origin. 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 [phone] [Dr.Byrnett];   #Hepatitis C/ectopic pregnancy/PRBC transfusion 1995-untreated.[Dr.Vanga]  #    # NGS/MOLECULAR TESTS:    # PALLIATIVE CARE EVALUATION:  # PAIN MANAGEMENT:    DIAGNOSIS:   STAGE:         ;  GOALS:  CURRENT/MOST RECENT THERAPY :     Gynecologic cancer ([place])  09/04/2019 Initial Diagnosis   Gynecologic cancer ([place])   09/04/2019 -  Chemotherapy   The patient had dexamethasone (DECADRON) 4 MG tablet, 8 mg, Oral, Daily, 0 of 1 cycle, Start date: --, End date: -- PALONOSETRON HCL INJECTION 0.25 MG/5ML, 0.25 mg, Intravenous,  Once, 0 of 6 cycles CARBOplatin (PARAPLATIN) in sodium chloride 0.9 % 100 mL chemo infusion, , Intravenous,  Once, 0 of 6 cycles FOSAPREPITANT IV INFUSION 150 MG, 150 mg, Intravenous,  Once, 0 of 6 cycles PACLitaxel (TAXOL) 318 mg in sodium  chloride 0.9 % 500 mL chemo infusion (> 47m/m2), 175 mg/m2, Intravenous,  Once, 0 of 6 cycles  for chemotherapy treatment.       RISK FACTORS:  Menarche was at age 64  First live birth at age 64  OCP use for approximately 10 years.  Ovaries intact: yes.  Hysterectomy: no.  Menopausal status: postmenopausal.  HRT use: 0 years. Colonoscopy: yes; 10 years ago, 3 or 4 polyps. Mammogram within the last year: no. Number of breast biopsies: 0. Up to date with pelvic exams: yes. Any excessive radiation exposure in the past: no  Past Medical History:  Diagnosis Date  . Arthritis   . Hypertension     Past Surgical History:  Procedure Laterality Date  . APPENDECTOMY      Social History   Socioeconomic History  . Marital status: Married    Spouse name: Not on file  . Number of children: Not on file  . Years of education: Not on file  . Highest education level: Not on file  Occupational History  . Not on file  Tobacco Use  . Smoking status: Current Every Day Smoker  . Smokeless tobacco: Never Used  Vaping Use  . Vaping Use: Never used  Substance and Sexual Activity  . Alcohol use: Not Currently  . Drug use: Not on file  . Sexual activity: Yes  Other Topics Concern  . 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 Difficulty of Paying Living Expenses: Not on file  Food Insecurity:   . Worried About Charity fundraiser in the Last Year: Not on file  . Ran Out of Food in the Last Year: Not on file  Transportation Needs:   . Lack of Transportation (Medical): Not on file  . Lack of Transportation (Non-Medical): Not on file  Physical Activity:   . Days of Exercise per Week: Not on file  . Minutes of Exercise per Session: Not on file  Stress:   . Feeling of  Stress : Not on file  Social Connections:   . Frequency of Communication with Friends and Family: Not on file  . Frequency of Social Gatherings with Friends and Family: Not on file  . Attends Religious Services: Not on file  . Active Member of Clubs or Organizations: Not on file  . Attends Archivist Meetings: Not on file  . Marital Status: Not on file     FAMILY HISTORY:  We obtained a detailed, 4-generation family history.  Significant diagnoses are listed below: Family History  Problem Relation Age of Onset  . Diabetes Maternal Grandmother   . Hypertension Maternal Grandmother   . Stroke Maternal Grandmother    Jeanette Allen has 2 sons, 62 and 5, and a daughter, 52. She has 4 maternal half brothers but is not close with them.  Jeanette Allen mother passed away in her 56s, no cancer history. Patient had 1 maternal aunt and 2 maternal uncles, no cancers. No known cancers in cousins. Maternal grandmother passed at 57, she does not have information about maternal grandfather.   Jeanette Allen does not have information about paternal side of the family.   Jeanette Allen is unaware of previous family history of genetic testing for hereditary cancer risks. Patient's maternal ancestors are of [place] and [place] descent, and paternal ancestors are of unknown descent. There is no reported Ashkenazi Jewish ancestry. There is no known consanguinity.  GENETIC COUNSELING ASSESSMENT: Jeanette Allen is a 64 y.o. female with a personal history of cancer which is somewhat suggestive of a hereditary cancer syndrome and predisposition to cancer. We, therefore, discussed and recommended the following at today's visit.   DISCUSSION: We discussed that about 15-25% of ovarian/fallopian tube cancer is hereditary meaning that it is due to a mutation in a single gene that is passed down from generation to generation in a family. Most cases of hereditary ovarian cancer are associated with BRCA1/BRCA2 genes,  although there are other genes associated with hereditary ovarian cancer as well. We discussed that testing is beneficial for several reasons including knowing if someone is a candidate for certain targeted therapies,  knowing about other cancer risks, identifying potential screening and risk-reduction options that may be appropriate, and to understand if other family members could be at risk for cancer and allow them to undergo genetic testing.   We reviewed the characteristics, features and inheritance patterns of hereditary cancer syndromes. We also discussed genetic testing, including the appropriate family members to test, the process of testing, insurance coverage and turn-around-time for results. We discussed the implications of a negative, positive and/or variant of uncertain significant result. We recommended Ms. Bundren pursue genetic testing for the [facility] MyRisk+MyChoice HRD gene panel.   The [facility] gene panel offered by [employer] includes sequencing and  deletion/duplication testing of the following 35 genes: APC, ATM, AXIN2, BARD1, BMPR1A, BRCA1, BRCA2, BRIP1, CHD1, CDK4, CDKN2A, CHEK2, EPCAM (large rearrangement only), HOXB13, GALNT12, MLH1, MSH2, MSH3, MSH6, MUTYH, NBN, NTHL1, PALB2, PMS2, PTEN, RAD51C, RAD51D, RNF43, RPS20, SMAD4, STK11, and TP53. Sequencing was performed for select regions of POLE and POLD1, and large rearrangement analysis was performed for select regions of GREM1.   Genetic testing through [facility] will test for hereditary mutations that could explain her diagnosis of cancer.  However, homologous recombination testing (HRD) is genetic testing performed on the tumor that can determine genetic changes that could influence her management such as eligibility for targeted therapies.  HRD testing is performed in tandem with genetic testing, and typically at no additional cost.    Based on Ms. Langseth's personal history of cancer, she meets medical criteria for  genetic testing. Despite that she meets criteria, she may still have an out of pocket cost.   PLAN: After considering the risks, benefits, and limitations, Ms. Pickerel provided informed consent to pursue genetic testing. She will have her blood drawn on 9/7 and the blood sample will be sent to [facility] for analysis of the MyRisk+MyChoice panel. Results should be available within approximately 4 weeks' time, at which point they will be disclosed by telephone to Ms. Polzin, as will any additional recommendations warranted by these results. Ms. Gilkerson will receive a summary of her genetic counseling visit and a copy of her results once available. This information will also be available in Epic.   Ms. Curbow questions were answered to her satisfaction today. Our contact information was provided should additional questions or concerns arise. Thank you for the referral and allowing [place] to share in the care of your patient.   Faith Rogue, MS, [facility] Genetic Counselor [email or web address] Phone: [phone]  The patient was seen for a total of 30 minutes in face-to-face genetic counseling.  Her husband Richardson Landry was also present. Drs. Magrinat, Lindi Adie and/or Burr Medico were available for discussion regarding this case.   _______________________________________________________________________ For Office Staff:  Number of people involved in session: 2 Was an Intern/ student involved with case: no

## 2019-09-06 NOTE — Progress Notes (Signed)
Pharmacist Chemotherapy Monitoring - Initial Assessment    Anticipated start date: 09/14/19  Regimen:  . Are orders appropriate based on the patient's diagnosis, regimen, and cycle? Yes . Does the plan date match the patient's scheduled date? Yes . Is the sequencing of drugs appropriate? Yes . Are the premedications appropriate for the patient's regimen? Yes . Prior Authorization for treatment is: Pending o If applicable, is the correct biosimilar selected based on the patient's insurance? not applicable  Organ Function and Labs: Marland Kitchen Are dose adjustments needed based on the patient's renal function, hepatic function, or hematologic function? No . Are appropriate labs ordered prior to the start of patient's treatment? Yes . Other organ system assessment, if indicated: N/A . The following baseline labs, if indicated, have been ordered: N/A  Dose Assessment: . Are the drug doses appropriate? Yes . Are the following correct: o Drug concentrations Yes o IV fluid compatible with drug Yes o Administration routes Yes o Timing of therapy No . If applicable, does the patient have documented access for treatment and/or plans for port-a-cath placement? no . If applicable, have lifetime cumulative doses been properly documented and assessed? yes Lifetime Dose Tracking  No doses have been documented on this patient for the following tracked chemicals: Doxorubicin, Epirubicin, Idarubicin, Daunorubicin, Mitoxantrone, Bleomycin, Oxaliplatin, Carboplatin, Liposomal Doxorubicin  o   Toxicity Monitoring/Prevention: . The patient has the following take home antiemetics prescribed: Ondansetron, Prochlorperazine, Dexamethasone and Lorazepam . The patient has the following take home medications prescribed: N/A . Medication allergies and previous infusion related reactions, if applicable, have been reviewed and addressed. Yes . The patient's current medication list has been assessed for drug-drug interactions  with their chemotherapy regimen. no significant drug-drug interactions were identified on review.  Order Review: . Are the treatment plan orders signed? No . Is the patient scheduled to see a provider prior to their treatment? Yes  I verify that I have reviewed each item in the above checklist and answered each question accordingly.  Jeanette Allen 09/06/2019 11:17 AM

## 2019-09-07 ENCOUNTER — Ambulatory Visit
Admission: RE | Admit: 2019-09-07 | Discharge: 2019-09-07 | Disposition: A | Discharge status: Home / Self Care | Payer: Medicare Other | Source: Ambulatory Visit | Attending: Obstetrics and Gynecology | Admitting: Obstetrics and Gynecology

## 2019-09-07 ENCOUNTER — Ambulatory Visit
Admission: RE | Admit: 2019-09-07 | Discharge: 2019-09-07 | Disposition: A | Discharge status: Home / Self Care | Payer: Medicare Other | Source: Ambulatory Visit | Attending: Internal Medicine | Admitting: Internal Medicine

## 2019-09-07 ENCOUNTER — Other Ambulatory Visit: Payer: Self-pay

## 2019-09-07 ENCOUNTER — Other Ambulatory Visit: Payer: Self-pay | Admitting: Internal Medicine

## 2019-09-07 ENCOUNTER — Telehealth: Payer: Self-pay

## 2019-09-07 ENCOUNTER — Other Ambulatory Visit
Admission: RE | Admit: 2019-09-07 | Discharge: 2019-09-07 | Disposition: A | Discharge status: Home / Self Care | Payer: Medicare Other | Source: Ambulatory Visit | Attending: Gastroenterology | Admitting: Gastroenterology

## 2019-09-07 DIAGNOSIS — Z20822 Contact with and (suspected) exposure to covid-19: Secondary | ICD-10-CM | POA: Diagnosis not present

## 2019-09-07 DIAGNOSIS — Z6827 Body mass index (BMI) 27.0-27.9, adult: Secondary | ICD-10-CM | POA: Diagnosis not present

## 2019-09-07 DIAGNOSIS — I10 Essential (primary) hypertension: Secondary | ICD-10-CM | POA: Diagnosis not present

## 2019-09-07 DIAGNOSIS — C763 Malignant neoplasm of pelvis: Secondary | ICD-10-CM

## 2019-09-07 DIAGNOSIS — R188 Other ascites: Secondary | ICD-10-CM

## 2019-09-07 DIAGNOSIS — R591 Generalized enlarged lymph nodes: Secondary | ICD-10-CM | POA: Diagnosis not present

## 2019-09-07 DIAGNOSIS — R630 Anorexia: Secondary | ICD-10-CM | POA: Diagnosis not present

## 2019-09-07 DIAGNOSIS — B1710 Acute hepatitis C without hepatic coma: Secondary | ICD-10-CM | POA: Insufficient documentation

## 2019-09-07 DIAGNOSIS — R1902 Left upper quadrant abdominal swelling, mass and lump: Secondary | ICD-10-CM | POA: Insufficient documentation

## 2019-09-07 DIAGNOSIS — C786 Secondary malignant neoplasm of retroperitoneum and peritoneum: Secondary | ICD-10-CM | POA: Diagnosis present

## 2019-09-07 DIAGNOSIS — Z79899 Other long term (current) drug therapy: Secondary | ICD-10-CM | POA: Diagnosis not present

## 2019-09-07 DIAGNOSIS — M1990 Unspecified osteoarthritis, unspecified site: Secondary | ICD-10-CM | POA: Diagnosis not present

## 2019-09-07 DIAGNOSIS — F17210 Nicotine dependence, cigarettes, uncomplicated: Secondary | ICD-10-CM | POA: Insufficient documentation

## 2019-09-07 DIAGNOSIS — R971 Elevated cancer antigen 125 [CA 125]: Secondary | ICD-10-CM | POA: Insufficient documentation

## 2019-09-07 DIAGNOSIS — R110 Nausea: Secondary | ICD-10-CM | POA: Insufficient documentation

## 2019-09-07 DIAGNOSIS — Z7901 Long term (current) use of anticoagulants: Secondary | ICD-10-CM | POA: Insufficient documentation

## 2019-09-07 DIAGNOSIS — R599 Enlarged lymph nodes, unspecified: Secondary | ICD-10-CM

## 2019-09-07 LAB — CBC WITH DIFFERENTIAL/PLATELET
Abs Immature Granulocytes: 0.02 10*3/uL (ref 0.00–0.07)
Basophils Absolute: 0 10*3/uL (ref 0.0–0.1)
Basophils Relative: 1 %
Eosinophils Absolute: 0.1 10*3/uL (ref 0.0–0.5)
Eosinophils Relative: 2 %
HCT: 40 % (ref 36.0–46.0)
Hemoglobin: 13.5 g/dL (ref 12.0–15.0)
Immature Granulocytes: 0 %
Lymphocytes Relative: 15 %
Lymphs Abs: 1.3 10*3/uL (ref 0.7–4.0)
MCH: 31.5 pg (ref 26.0–34.0)
MCHC: 33.8 g/dL (ref 30.0–36.0)
MCV: 93.5 fL (ref 80.0–100.0)
Monocytes Absolute: 0.7 10*3/uL (ref 0.1–1.0)
Monocytes Relative: 8 %
Neutro Abs: 6.3 10*3/uL (ref 1.7–7.7)
Neutrophils Relative %: 74 %
Platelets: 346 10*3/uL (ref 150–400)
RBC: 4.28 MIL/uL (ref 3.87–5.11)
RDW: 12.1 % (ref 11.5–15.5)
WBC: 8.4 10*3/uL (ref 4.0–10.5)
nRBC: 0 % (ref 0.0–0.2)

## 2019-09-07 LAB — BASIC METABOLIC PANEL
Anion gap: 14 (ref 5–15)
BUN: 14 mg/dL (ref 8–23)
CO2: 20 mmol/L — ABNORMAL LOW (ref 22–32)
Calcium: 9.2 mg/dL (ref 8.9–10.3)
Chloride: 104 mmol/L (ref 98–111)
Creatinine, Ser: 0.72 mg/dL (ref 0.44–1.00)
GFR calc Af Amer: >60 mL/min (ref >60)
GFR calc non Af Amer: >60 mL/min (ref >60)
Glucose, Bld: 91 mg/dL (ref 70–99)
Potassium: 4 mmol/L (ref 3.5–5.1)
Sodium: 138 mmol/L (ref 135–145)

## 2019-09-07 LAB — SARS CORONAVIRUS 2 (TAT 6-24 HRS): SARS Coronavirus 2: NEGATIVE

## 2019-09-07 MED ORDER — FENTANYL CITRATE (PF) 100 MCG/2ML IJ SOLN
INTRAMUSCULAR | Status: AC
  Filled 2019-09-07: qty 2

## 2019-09-07 MED ORDER — FENTANYL CITRATE (PF) 100 MCG/2ML IJ SOLN
INTRAMUSCULAR | Status: AC | PRN
  Administered 2019-09-07: 50 ug via INTRAVENOUS
  Administered 2019-09-07 (×2): 25 ug via INTRAVENOUS

## 2019-09-07 MED ORDER — SODIUM CHLORIDE 0.9 % IV SOLN
INTRAVENOUS | Status: DC
  Administered 2019-09-07: 1000 mL via INTRAVENOUS

## 2019-09-07 MED ORDER — MIDAZOLAM HCL 2 MG/2ML IJ SOLN
INTRAMUSCULAR | Status: AC
  Filled 2019-09-07: qty 2

## 2019-09-07 MED ORDER — MIDAZOLAM HCL 2 MG/2ML IJ SOLN
INTRAMUSCULAR | Status: AC | PRN
  Administered 2019-09-07 (×2): 1 mg via INTRAVENOUS

## 2019-09-07 NOTE — Telephone Encounter (Signed)
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

## 2019-09-07 NOTE — Procedures (Signed)
Interventional Radiology Procedure Note  Procedure: CT Guided Biopsy of left peritoneal mass  Complications: None  Estimated Blood Loss: < 10 mL  Findings: 18 G core biopsy of left peritoneal mass performed under CT guidance.  Four core samples obtained and sent to Pathology.  Venetia Night. Kathlene Cote, M.D Pager:  [phone]

## 2019-09-07 NOTE — H&P (Signed)
Chief Complaint: Patient was seen in consultation today for abdominal mass  Referring Physician(s): Gillis Ends  Supervising Physician: Aletta Edouard  Patient Status: [facility] - Out-pt  History of Present Illness: Jeanette Allen is a 64 y.o. female with history of arthritis, HTN, hepatitis C from prior blood transfusion- untreated who presented to her PCP with abdominal distention, anorexia, and nausea.  Lab work revealed elevated CA-125.  Patient was then referred for CT imaging which showed ascites with adenopathy adjacent to the upper portion of the stomach beneath the anterior abdominal wall as well as mid left mesenteric adenopathy.  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 IR now consulted for biopsy of abdominal mass.   Patient presents today for paracentesis and abdominal mass biopsy. Limited US Abdomen shows only a small pocket of perihepatic fluid.  Jeanette Allen is otherwise in her usual state of health. She continues to have symptoms of distention and intermittent nausea.  She is agreeable to biopsy today.  She has been NPO.  She does not take blood thinners.   Past Medical History:  Diagnosis Date  . Arthritis   . Hypertension     Past Surgical History:  Procedure Laterality Date  . APPENDECTOMY      Allergies: Patient has no known allergies.  Medications: Prior to Admission medications   Medication Sig Start Date End Date Taking? Authorizing Provider  dicyclomine (BENTYL) 10 MG/5ML solution Take by mouth 4 (four) times daily -  before meals and at bedtime.   Yes [provider]  FLUoxetine (PROZAC) 20 MG capsule Take by mouth. 08/14/19  Yes [provider]  prochlorperazine (COMPAZINE) 10 MG tablet Take 1 tablet (10 mg total) by mouth every 6 (six) hours as needed for nausea or vomiting. 09/04/19  Yes Cammie Sickle, MD  ergocalciferol (VITAMIN D2) 1.25 MG  (50000 UT) capsule Take by mouth. 07/09/19   [provider]  losartan (COZAAR) 50 MG tablet Take by mouth. 05/18/19   [provider]  ondansetron (ZOFRAN) 8 MG tablet One pill every 8 hours as needed for nausea/vomitting. 09/04/19   Cammie Sickle, MD  ondansetron (ZOFRAN-ODT) 4 MG disintegrating tablet Take 4 mg by mouth every 8 (eight) hours as needed for nausea or vomiting.    [provider]  Pitavastatin Calcium (LIVALO) 4 MG TABS Take 4 mg by mouth daily.    [provider]     Family History  Problem Relation Age of Onset  . Diabetes Maternal Grandmother   . Hypertension Maternal Grandmother   . Stroke Maternal Grandmother     Social History   Socioeconomic History  . Marital status: Married    Spouse name: Not on file  . Number of children: Not on file  . Years of education: Not on file  . Highest education level: Not on file  Occupational History  . Not on file  Tobacco Use  . Smoking status: Current Every Day Smoker  . Smokeless tobacco: Never Used  Vaping Use  . Vaping Use: Never used  Substance and Sexual Activity  . Alcohol use: Not Currently  . Drug use: Not on file  . Sexual activity: Yes  Other Topics Concern  . Not on file  Social History Narrative   Lives with husband; near [place]. Worked Network engineer at [employer]; smoke 1/2 ppd/slowed; no alcohol; 3 children [2 boys; one girl- [employer]]   Social Determinants of Health   Financial  Resource Strain:   . Difficulty of Paying Living Expenses: Not on file  Food Insecurity:   . Worried About Charity fundraiser in the Last Year: Not on file  . Ran Out of Food in the Last Year: Not on file  Transportation Needs:   . Lack of Transportation (Medical): Not on file  . Lack of Transportation (Non-Medical): Not on file  Physical Activity:   . Days of Exercise per Week: Not on file  . Minutes of Exercise per Session: Not on file  Stress:   . Feeling of Stress :  Not on file  Social Connections:   . Frequency of Communication with Friends and Family: Not on file  . Frequency of Social Gatherings with Friends and Family: Not on file  . Attends Religious Services: Not on file  . Active Member of Clubs or Organizations: Not on file  . Attends Archivist Meetings: Not on file  . Marital Status: Not on file     Review of Systems: A 12 point ROS discussed and pertinent positives are indicated in the HPI above.  All other systems are negative.  Review of Systems  Constitutional: Negative for fatigue and fever.  Respiratory: Negative for cough and shortness of breath.   Cardiovascular: Negative for chest pain.  Gastrointestinal: Positive for abdominal distention, abdominal pain and nausea.  Genitourinary: Negative for dysuria.  Musculoskeletal: Negative for back pain.  Psychiatric/Behavioral: Negative for behavioral problems and confusion.    Vital Signs: BP (!) 147/74   Pulse 65   Temp 98.5 F (36.9 C) (Oral)   Resp (!) 22   Ht 5\' 4"  (1.626 m)   Wt 163 lb (73.9 kg)   SpO2 97%   BMI 27.98 kg/m   Physical Exam Vitals and nursing note reviewed.  Constitutional:      General: She is not in acute distress.    Appearance: Normal appearance. She is not ill-appearing.  HENT:     Mouth/Throat:     Mouth: Mucous membranes are moist.     Pharynx: Oropharynx is clear.  Cardiovascular:     Rate and Rhythm: Normal rate and regular rhythm.  Pulmonary:     Effort: Pulmonary effort is normal. No respiratory distress.     Breath sounds: Normal breath sounds.  Abdominal:     General: There is distension.     Tenderness: There is no abdominal tenderness. There is no guarding.  Skin:    General: Skin is warm and dry.  Neurological:     General: No focal deficit present.     Mental Status: She is alert and oriented to person, place, and time. Mental status is at baseline.  Psychiatric:        Mood and Affect: Mood normal.         Behavior: Behavior normal.        Thought Content: Thought content normal.        Judgment: Judgment normal.      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 Status post paracentesis on 08/27/2019 with removal 1.3 L of fluid at that time. EXAM: LIMITED ABDOMEN ULTRASOUND FOR ASCITES TECHNIQUE: Limited ultrasound survey for ascites was performed in all four abdominal quadrants. COMPARISON:  08/27/2019 FINDINGS: A large volume of ascites was not present by ultrasound with most  of the fluid adjacent to the liver. Therapeutic paracentesis was deferred today. The patient is scheduled for peritoneal mass biopsy later today. IMPRESSION: Relatively small overall volume of ascites by ultrasound. Therapeutic paracentesis was deferred today. Electronically Signed   By: Aletta Edouard M.D.   On: 09/07/2019 11:12   US Paracentesis  Result Date: 08/27/2019 INDICATION: Abdominal distention and discomfort. Shortness of breath. Ascites. Request for diagnostic and therapeutic paracentesis. EXAM: ULTRASOUND GUIDED RIGHT LOWER QUADRANT PARACENTESIS MEDICATIONS: 1% plain lidocaine, 5 mL COMPLICATIONS: None immediate. PROCEDURE: Informed written consent was obtained from the patient after a discussion of the risks, benefits and alternatives to treatment. A timeout was performed prior to the initiation of the procedure. Initial ultrasound scanning demonstrates a small to moderate amount of ascites within the right lower abdominal quadrant. The right lower abdomen was prepped and draped in the usual sterile fashion. 1% lidocaine was used for local anesthesia. Following this, a 6 Fr Safe-T-Centesis catheter was introduced. An ultrasound image was saved for documentation purposes. The paracentesis was performed. The  catheter was removed and a dressing was applied. The patient tolerated the procedure well without immediate post procedural complication. FINDINGS: A total of approximately 1250 mL of clear, amber colored fluid was removed. Samples were sent to the laboratory as requested by the clinical team. IMPRESSION: Successful ultrasound-guided paracentesis yielding 1250 mL of peritoneal fluid. Read by: Ascencion Dike PA-C Electronically Signed   By: Jerilynn Mages.  Shick M.D.   On: 08/27/2019 15:23    Labs:  CBC: Recent Labs    09/07/19 1044  WBC 8.4  HGB 13.5  HCT 40.0  PLT 346    COAGS: Recent Labs    08/29/19 1140  INR 1.0  APTT 30    BMP: Recent Labs    09/07/19 1044  NA 138  K 4.0  CL 104  CO2 20*  GLUCOSE 91  BUN 14  CALCIUM 9.2  CREATININE 0.72  GFRNONAA >60  GFRAA >60    LIVER FUNCTION TESTS: No results for input(s): BILITOT, AST, ALT, ALKPHOS, PROT, ALBUMIN in the last 8760 hours.  TUMOR MARKERS: No results for input(s): AFPTM, CEA, CA199, CHROMGRNA in the last 8760 hours.  Assessment and Plan: Patient with past medical history of hepatitis C presents with complaint of abdominal distention, concern for gynecological primary cancer with ascitic fluid recently positive for high-grade serous carcinoma.  IR consulted for abdominal mass biopsy at the request of Dr. Rogue Bussing. Case reviewed by Dr. Kathlene Cote who approves patient for procedure.  Patient presents today in their usual state of health.  She has been NPO and is not currently on blood thinners.    Risks and benefits was discussed with the patient and/or patient's family including, but not limited to bleeding, infection, damage to adjacent structures or low yield requiring additional tests.  All of the questions were answered and there is agreement to proceed.  Consent signed and in chart.  Thank you for this interesting consult.  I greatly enjoyed meeting Ival B Pegg and look forward to participating in their  care.  A copy of this report was sent to the requesting provider on this date.  Electronically Signed: Docia Barrier, PA 09/07/2019, 11:46 AM   I spent a total of  40 Minutes   in face to face in clinical consultation, greater than 50% of which was counseling/coordinating care for abdominal mass, high-grade serous carcinoma.

## 2019-09-07 NOTE — Patient Instructions (Signed)
Paclitaxel injection What is this medicine? PACLITAXEL (PAK li TAX el) is a chemotherapy drug. It targets fast dividing cells, like cancer cells, and causes these cells to die. This medicine is used to treat ovarian cancer, breast cancer, lung cancer, Kaposi's sarcoma, and other cancers. This medicine may be used for other purposes; ask your health care provider or pharmacist if you have questions. COMMON BRAND NAME(S): Onxol, Taxol What should I tell my health care provider before I take this medicine? They need to know if you have any of these conditions:  history of irregular heartbeat  liver disease  low blood counts, like low white cell, platelet, or red cell counts  lung or breathing disease, like asthma  tingling of the fingers or toes, or other nerve disorder  an unusual or allergic reaction to paclitaxel, alcohol, polyoxyethylated castor oil, other chemotherapy, other medicines, foods, dyes, or preservatives  pregnant or trying to get pregnant  breast-feeding How should I use this medicine? This drug is given as an infusion into a vein. It is administered in a hospital or clinic by a specially trained health care professional. Talk to your pediatrician regarding the use of this medicine in children. Special care may be needed. Overdosage: If you think you have taken too much of this medicine contact a poison control center or emergency room at once. NOTE: This medicine is only for you. Do not share this medicine with others. What if I miss a dose? It is important not to miss your dose. Call your doctor or health care professional if you are unable to keep an appointment. What may interact with this medicine? Do not take this medicine with any of the following medications:  disulfiram  metronidazole This medicine may also interact with the following medications:  antiviral medicines for hepatitis, HIV or AIDS  certain antibiotics like erythromycin and  clarithromycin  certain medicines for fungal infections like ketoconazole and itraconazole  certain medicines for seizures like carbamazepine, phenobarbital, phenytoin  gemfibrozil  nefazodone  rifampin  St. John's wort This list may not describe all possible interactions. Give your health care provider a list of all the medicines, herbs, non-prescription drugs, or dietary supplements you use. Also tell them if you smoke, drink alcohol, or use illegal drugs. Some items may interact with your medicine. What should I watch for while using this medicine? Your condition will be monitored carefully while you are receiving this medicine. You will need important blood work done while you are taking this medicine. This medicine can cause serious allergic reactions. To reduce your risk you will need to take other medicine(s) before treatment with this medicine. If you experience allergic reactions like skin rash, itching or hives, swelling of the face, lips, or tongue, tell your doctor or health care professional right away. In some cases, you may be given additional medicines to help with side effects. Follow all directions for their use. This drug may make you feel generally unwell. This is not uncommon, as chemotherapy can affect healthy cells as well as cancer cells. Report any side effects. Continue your course of treatment even though you feel ill unless your doctor tells you to stop. Call your doctor or health care professional for advice if you get a fever, chills or sore throat, or other symptoms of a cold or flu. Do not treat yourself. This drug decreases your body's ability to fight infections. Try to avoid being around people who are sick. This medicine may increase your risk to bruise   or bleed. Call your doctor or health care professional if you notice any unusual bleeding. Be careful brushing and flossing your teeth or using a toothpick because you may get an infection or bleed more easily.  If you have any dental work done, tell your dentist you are receiving this medicine. Avoid taking products that contain aspirin, acetaminophen, ibuprofen, naproxen, or ketoprofen unless instructed by your doctor. These medicines may hide a fever. Do not become pregnant while taking this medicine. Women should inform their doctor if they wish to become pregnant or think they might be pregnant. There is a potential for serious side effects to an unborn child. Talk to your health care professional or pharmacist for more information. Do not breast-feed an infant while taking this medicine. Men are advised not to father a child while receiving this medicine. This product may contain alcohol. Ask your pharmacist or healthcare provider if this medicine contains alcohol. Be sure to tell all healthcare providers you are taking this medicine. Certain medicines, like metronidazole and disulfiram, can cause an unpleasant reaction when taken with alcohol. The reaction includes flushing, headache, nausea, vomiting, sweating, and increased thirst. The reaction can last from 30 minutes to several hours. What side effects may I notice from receiving this medicine? Side effects that you should report to your doctor or health care professional as soon as possible:  allergic reactions like skin rash, itching or hives, swelling of the face, lips, or tongue  breathing problems  changes in vision  fast, irregular heartbeat  high or low blood pressure  mouth sores  pain, tingling, numbness in the hands or feet  signs of decreased platelets or bleeding - bruising, pinpoint red spots on the skin, black, tarry stools, blood in the urine  signs of decreased red blood cells - unusually weak or tired, feeling faint or lightheaded, falls  signs of infection - fever or chills, cough, sore throat, pain or difficulty passing urine  signs and symptoms of liver injury like dark yellow or brown urine; general ill feeling or  flu-like symptoms; light-colored stools; loss of appetite; nausea; right upper belly pain; unusually weak or tired; yellowing of the eyes or skin  swelling of the ankles, feet, hands  unusually slow heartbeat Side effects that usually do not require medical attention (report to your doctor or health care professional if they continue or are bothersome):  diarrhea  hair loss  loss of appetite  muscle or joint pain  nausea, vomiting  pain, redness, or irritation at site where injected  tiredness This list may not describe all possible side effects. Call your doctor for medical advice about side effects. You may report side effects to FDA at 1-800-FDA-1088. Where should I keep my medicine? This drug is given in a hospital or clinic and will not be stored at home. NOTE: This sheet is a summary. It may not cover all possible information. If you have questions about this medicine, talk to your doctor, pharmacist, or health care provider.  2020 Elsevier/Gold Standard (2016-08-24 13:14:55) Carboplatin injection What is this medicine? CARBOPLATIN (KAR boe pla tin) is a chemotherapy drug. It targets fast dividing cells, like cancer cells, and causes these cells to die. This medicine is used to treat ovarian cancer and many other cancers. This medicine may be used for other purposes; ask your health care provider or pharmacist if you have questions. COMMON BRAND NAME(S): Paraplatin What should I tell my health care provider before I take this medicine? They need to   know if you have any of these conditions:  blood disorders  hearing problems  kidney disease  recent or ongoing radiation therapy  an unusual or allergic reaction to carboplatin, cisplatin, other chemotherapy, other medicines, foods, dyes, or preservatives  pregnant or trying to get pregnant  breast-feeding How should I use this medicine? This drug is usually given as an infusion into a vein. It is administered in a  hospital or clinic by a specially trained health care professional. Talk to your pediatrician regarding the use of this medicine in children. Special care may be needed. Overdosage: If you think you have taken too much of this medicine contact a poison control center or emergency room at once. NOTE: This medicine is only for you. Do not share this medicine with others. What if I miss a dose? It is important not to miss a dose. Call your doctor or health care professional if you are unable to keep an appointment. What may interact with this medicine?  medicines for seizures  medicines to increase blood counts like filgrastim, pegfilgrastim, sargramostim  some antibiotics like amikacin, gentamicin, neomycin, streptomycin, tobramycin  vaccines Talk to your doctor or health care professional before taking any of these medicines:  acetaminophen  aspirin  ibuprofen  ketoprofen  naproxen This list may not describe all possible interactions. Give your health care provider a list of all the medicines, herbs, non-prescription drugs, or dietary supplements you use. Also tell them if you smoke, drink alcohol, or use illegal drugs. Some items may interact with your medicine. What should I watch for while using this medicine? Your condition will be monitored carefully while you are receiving this medicine. You will need important blood work done while you are taking this medicine. This drug may make you feel generally unwell. This is not uncommon, as chemotherapy can affect healthy cells as well as cancer cells. Report any side effects. Continue your course of treatment even though you feel ill unless your doctor tells you to stop. In some cases, you may be given additional medicines to help with side effects. Follow all directions for their use. Call your doctor or health care professional for advice if you get a fever, chills or sore throat, or other symptoms of a cold or flu. Do not treat  yourself. This drug decreases your body's ability to fight infections. Try to avoid being around people who are sick. This medicine may increase your risk to bruise or bleed. Call your doctor or health care professional if you notice any unusual bleeding. Be careful brushing and flossing your teeth or using a toothpick because you may get an infection or bleed more easily. If you have any dental work done, tell your dentist you are receiving this medicine. Avoid taking products that contain aspirin, acetaminophen, ibuprofen, naproxen, or ketoprofen unless instructed by your doctor. These medicines may hide a fever. Do not become pregnant while taking this medicine. Women should inform their doctor if they wish to become pregnant or think they might be pregnant. There is a potential for serious side effects to an unborn child. Talk to your health care professional or pharmacist for more information. Do not breast-feed an infant while taking this medicine. What side effects may I notice from receiving this medicine? Side effects that you should report to your doctor or health care professional as soon as possible:  allergic reactions like skin rash, itching or hives, swelling of the face, lips, or tongue  signs of infection - fever or   chills, cough, sore throat, pain or difficulty passing urine  signs of decreased platelets or bleeding - bruising, pinpoint red spots on the skin, black, tarry stools, nosebleeds  signs of decreased red blood cells - unusually weak or tired, fainting spells, lightheadedness  breathing problems  changes in hearing  changes in vision  chest pain  high blood pressure  low blood counts - This drug may decrease the number of white blood cells, red blood cells and platelets. You may be at increased risk for infections and bleeding.  nausea and vomiting  pain, swelling, redness or irritation at the injection site  pain, tingling, numbness in the hands or  feet  problems with balance, talking, walking  trouble passing urine or change in the amount of urine Side effects that usually do not require medical attention (report to your doctor or health care professional if they continue or are bothersome):  hair loss  loss of appetite  metallic taste in the mouth or changes in taste This list may not describe all possible side effects. Call your doctor for medical advice about side effects. You may report side effects to FDA at 1-800-FDA-1088. Where should I keep my medicine? This drug is given in a hospital or clinic and will not be stored at home. NOTE: This sheet is a summary. It may not cover all possible information. If you have questions about this medicine, talk to your doctor, pharmacist, or health care provider.  2020 Elsevier/Gold Standard (2007-03-28 14:38:05)  

## 2019-09-07 NOTE — Progress Notes (Signed)
Patient assessed in [place] for paracentesis prior to CT biopsy today.  Limited US Abdomen shows only small amount of reaccumulated ascites around the liver, not amenable to drainage today.   Brynda Greathouse, MS RD PA-C 9:45 AM

## 2019-09-07 NOTE — Progress Notes (Signed)
Patient clinically stable post Peritoneal biopsy per Dr Kathlene Cote, tolerated well. Awake/alert and oriented post procedure. Denies complaints at this time.received Versed 2mg  along with Fentanyl 100 mcg IV for procedure. Report given to Genelle Bal RN post procedure for recovery.

## 2019-09-11 ENCOUNTER — Other Ambulatory Visit: Payer: Self-pay

## 2019-09-11 ENCOUNTER — Inpatient Hospital Stay (HOSPITAL_BASED_OUTPATIENT_CLINIC_OR_DEPARTMENT_OTHER): Payer: Medicare Other | Admitting: Oncology

## 2019-09-11 ENCOUNTER — Inpatient Hospital Stay: Payer: Medicare Other

## 2019-09-11 ENCOUNTER — Telehealth: Payer: Self-pay

## 2019-09-11 ENCOUNTER — Other Ambulatory Visit: Payer: Self-pay | Admitting: Internal Medicine

## 2019-09-11 DIAGNOSIS — C763 Malignant neoplasm of pelvis: Secondary | ICD-10-CM | POA: Diagnosis not present

## 2019-09-11 DIAGNOSIS — C579 Malignant neoplasm of female genital organ, unspecified: Secondary | ICD-10-CM

## 2019-09-11 DIAGNOSIS — R971 Elevated cancer antigen 125 [CA 125]: Secondary | ICD-10-CM | POA: Diagnosis not present

## 2019-09-11 DIAGNOSIS — F329 Major depressive disorder, single episode, unspecified: Secondary | ICD-10-CM | POA: Diagnosis not present

## 2019-09-11 DIAGNOSIS — C801 Malignant (primary) neoplasm, unspecified: Secondary | ICD-10-CM | POA: Diagnosis present

## 2019-09-11 DIAGNOSIS — M1990 Unspecified osteoarthritis, unspecified site: Secondary | ICD-10-CM | POA: Diagnosis not present

## 2019-09-11 DIAGNOSIS — Z5111 Encounter for antineoplastic chemotherapy: Secondary | ICD-10-CM | POA: Diagnosis present

## 2019-09-11 DIAGNOSIS — I10 Essential (primary) hypertension: Secondary | ICD-10-CM | POA: Diagnosis not present

## 2019-09-11 DIAGNOSIS — B1920 Unspecified viral hepatitis C without hepatic coma: Secondary | ICD-10-CM | POA: Diagnosis not present

## 2019-09-11 DIAGNOSIS — E785 Hyperlipidemia, unspecified: Secondary | ICD-10-CM | POA: Diagnosis not present

## 2019-09-11 DIAGNOSIS — C786 Secondary malignant neoplasm of retroperitoneum and peritoneum: Secondary | ICD-10-CM | POA: Diagnosis present

## 2019-09-11 DIAGNOSIS — R188 Other ascites: Secondary | ICD-10-CM | POA: Diagnosis not present

## 2019-09-11 DIAGNOSIS — Z79899 Other long term (current) drug therapy: Secondary | ICD-10-CM | POA: Diagnosis not present

## 2019-09-11 DIAGNOSIS — F17210 Nicotine dependence, cigarettes, uncomplicated: Secondary | ICD-10-CM | POA: Diagnosis not present

## 2019-09-11 DIAGNOSIS — K219 Gastro-esophageal reflux disease without esophagitis: Secondary | ICD-10-CM | POA: Diagnosis not present

## 2019-09-11 LAB — COMPREHENSIVE METABOLIC PANEL
ALT: 21 U/L (ref 0–44)
AST: 27 U/L (ref 15–41)
Albumin: 3.4 g/dL — ABNORMAL LOW (ref 3.5–5.0)
Alkaline Phosphatase: 74 U/L (ref 38–126)
Anion gap: 8 (ref 5–15)
BUN: 18 mg/dL (ref 8–23)
CO2: 23 mmol/L (ref 22–32)
Calcium: 8.9 mg/dL (ref 8.9–10.3)
Chloride: 105 mmol/L (ref 98–111)
Creatinine, Ser: 0.85 mg/dL (ref 0.44–1.00)
GFR calc Af Amer: >60 mL/min (ref >60)
GFR calc non Af Amer: >60 mL/min (ref >60)
Glucose, Bld: 117 mg/dL — ABNORMAL HIGH (ref 70–99)
Potassium: 3.9 mmol/L (ref 3.5–5.1)
Sodium: 136 mmol/L (ref 135–145)
Total Bilirubin: 0.6 mg/dL (ref 0.3–1.2)
Total Protein: 7.1 g/dL (ref 6.5–8.1)

## 2019-09-11 LAB — CBC WITH DIFFERENTIAL/PLATELET
Abs Immature Granulocytes: 0.03 10*3/uL (ref 0.00–0.07)
Basophils Absolute: 0 10*3/uL (ref 0.0–0.1)
Basophils Relative: 1 %
Eosinophils Absolute: 0.1 10*3/uL (ref 0.0–0.5)
Eosinophils Relative: 1 %
HCT: 40.6 % (ref 36.0–46.0)
Hemoglobin: 14.1 g/dL (ref 12.0–15.0)
Immature Granulocytes: 0 %
Lymphocytes Relative: 14 %
Lymphs Abs: 1.1 10*3/uL (ref 0.7–4.0)
MCH: 32.1 pg (ref 26.0–34.0)
MCHC: 34.7 g/dL (ref 30.0–36.0)
MCV: 92.5 fL (ref 80.0–100.0)
Monocytes Absolute: 0.7 10*3/uL (ref 0.1–1.0)
Monocytes Relative: 8 %
Neutro Abs: 6.3 10*3/uL (ref 1.7–7.7)
Neutrophils Relative %: 76 %
Platelets: 333 10*3/uL (ref 150–400)
RBC: 4.39 MIL/uL (ref 3.87–5.11)
RDW: 11.9 % (ref 11.5–15.5)
WBC: 8.2 10*3/uL (ref 4.0–10.5)
nRBC: 0 % (ref 0.0–0.2)

## 2019-09-11 NOTE — Progress Notes (Signed)
[place]  [phone] [phone]  Patient Care Team: Danelle Berry, NP as PCP - General (Nurse Practitioner) Clent Jacks, RN as Oncology Nurse Navigator   Name of the patient: Jeanette Allen  [id number]  [birth year]   Date of visit: 09/11/19  Diagnosis-ovarian cancer  Chief complaint/Reason for visit- Initial Meeting for [facility], preparing for starting chemotherapy  Heme/Onc history:  Oncology History Overview Note  #August 2021-ascitic fluid cytology-high-grade serous carcinoma of gynecologic origin; CT scan-behind the abdominal wall 4.6 x 2.1 mass ; within the mesentery 6.7 x 4.5 cm left of mid abdomen . [phone] [Dr.Byrnett];   #Hepatitis C/ectopic pregnancy/PRBC transfusion 1995-untreated.[Dr.Vanga]  #    # NGS/MOLECULAR TESTS:    # PALLIATIVE CARE EVALUATION:  # PAIN MANAGEMENT:    DIAGNOSIS:   STAGE:         ;  GOALS:  CURRENT/MOST RECENT THERAPY :     Gynecologic cancer ([place])  09/04/2019 Initial Diagnosis   Gynecologic cancer ([place])   09/14/2019 -  Chemotherapy   The patient had dexamethasone (DECADRON) 4 MG tablet, 8 mg, Oral, Daily, 0 of 1 cycle, Start date: --, End date: -- palonosetron (ALOXI) injection 0.25 mg, 0.25 mg, Intravenous,  Once, 0 of 6 cycles CARBOplatin (PARAPLATIN) 620 mg in sodium chloride 0.9 % 250 mL chemo infusion, 620 mg (original dose ), Intravenous,  Once, 0 of 6 cycles Dose modification:   (Cycle 1) fosaprepitant (EMEND) 150 mg in sodium chloride 0.9 % 145 mL IVPB, 150 mg, Intravenous,  Once, 0 of 6 cycles PACLitaxel (TAXOL) 318 mg in sodium chloride 0.9 % 500 mL chemo infusion (> 80mg /m2), 175 mg/m2, Intravenous,  Once, 0 of 6 cycles bevacizumab-awwb (MVASI) 1,100 mg in sodium chloride 0.9 % 100 mL chemo infusion, 15 mg/kg = 1,100 mg (original dose ), Intravenous, Every 21 days, 0 of 6 cycles Dose modification: 15 mg/kg (Cycle 1)  for  chemotherapy treatment.      Interval history-  Jeanette Allen is a 64 yo female who presents to chemo care clinic today for initial meeting in preparation for starting chemotherapy. I introduced the chemo care clinic and we discussed that the role of the clinic is to assist those who are at an increased risk of emergency room visits and/or complications during the course of chemotherapy treatment. We discussed that the increased risk takes into account factors such as age, performance status, and co-morbidities. We also discussed that for some, this might include barriers to care such as not having a primary care provider, lack of insurance/transportation, or not being able to afford medications. We discussed that the goal of the program is to help prevent unplanned ER visits and help reduce complications during chemotherapy. We do this by discussing specific risk factors to each individual and identifying ways that we can help improve these risk factors and reduce barriers to care.   ECOG FS:1 - Symptomatic but completely ambulatory  Review of systems- Review of Systems  Constitutional: Negative.  Negative for chills, fever, malaise/fatigue and weight loss.  HENT: Negative for congestion, ear pain and tinnitus.   Eyes: Negative.  Negative for blurred vision and double vision.  Respiratory: Negative.  Negative for cough, sputum production and shortness of breath.   Cardiovascular: Negative.  Negative for chest pain, palpitations and leg swelling.  Gastrointestinal: Positive for abdominal pain. Negative for constipation, diarrhea, nausea and vomiting.  Genitourinary: Negative for dysuria, frequency and urgency.  Musculoskeletal:  Negative for back pain and falls.  Skin: Negative.  Negative for rash.  Neurological: Negative.  Negative for weakness and headaches.  Endo/Heme/Allergies: Negative.  Does not bruise/bleed easily.  Psychiatric/Behavioral: Negative.  Negative for depression. The patient is  not nervous/anxious and does not have insomnia.      Current treatment-carbotaxol every 3 weeks  No Known Allergies  Past Medical History:  Diagnosis Date  . Arthritis   . Hypertension     Past Surgical History:  Procedure Laterality Date  . APPENDECTOMY      Social History   Socioeconomic History  . Marital status: Married    Spouse name: Not on file  . Number of children: Not on file  . Years of education: Not on file  . Highest education level: Not on file  Occupational History  . Not on file  Tobacco Use  . Smoking status: Current Every Day Smoker  . Smokeless tobacco: Never Used  Vaping Use  . Vaping Use: Never used  Substance and Sexual Activity  . Alcohol use: Not Currently  . Drug use: Not on file  . Sexual activity: Yes  Other Topics Concern  . Not on file  Social History Narrative   Lives with husband; near [place]. 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 Difficulty of Paying Living Expenses: Not on file  Food Insecurity:   . Worried About Charity fundraiser in the Last Year: Not on file  . Ran Out of Food in the Last Year: Not on file  Transportation Needs:   . Lack of Transportation (Medical): Not on file  . Lack of Transportation (Non-Medical): Not on file  Physical Activity:   . Days of Exercise per Week: Not on file  . Minutes of Exercise per Session: Not on file  Stress:   . Feeling of Stress : Not on file  Social Connections:   . Frequency of Communication with Friends and Family: Not on file  . Frequency of Social Gatherings with Friends and Family: Not on file  . Attends Religious Services: Not on file  . Active Member of Clubs or Organizations: Not on file  . Attends Archivist Meetings: Not on file  . Marital Status: Not on file  Intimate Partner Violence:   . Fear of Current or  Ex-Partner: Not on file  . Emotionally Abused: Not on file  . Physically Abused: Not on file  . Sexually Abused: Not on file    Family History  Problem Relation Age of Onset  . Diabetes Maternal Grandmother   . Hypertension Maternal Grandmother   . Stroke Maternal Grandmother      Current Outpatient Medications:  .  dicyclomine (BENTYL) 10 MG/5ML solution, Take by mouth 4 (four) times daily -  before meals and at bedtime., Disp: , Rfl:  .  ergocalciferol (VITAMIN D2) 1.25 MG (50000 UT) capsule, Take by mouth., Disp: , Rfl:  .  FLUoxetine (PROZAC) 20 MG capsule, Take by mouth., Disp: , Rfl:  .  losartan (COZAAR) 50 MG tablet, Take by mouth., Disp: , Rfl:  .  ondansetron (ZOFRAN) 8 MG tablet, One pill every 8 hours as needed for nausea/vomitting., Disp: 60 tablet, Rfl: 1 .  ondansetron (ZOFRAN-ODT) 4 MG disintegrating tablet, Take 4 mg by mouth every 8 (eight) hours as needed for nausea or vomiting., Disp: ,  Rfl:  .  Pitavastatin Calcium (LIVALO) 4 MG TABS, Take 4 mg by mouth daily., Disp: , Rfl:  .  prochlorperazine (COMPAZINE) 10 MG tablet, Take 1 tablet (10 mg total) by mouth every 6 (six) hours as needed for nausea or vomiting., Disp: 40 tablet, Rfl: 1  Physical exam: There were no vitals filed for this visit. Physical Exam Constitutional:      Appearance: Normal appearance.  HENT:     Head: Normocephalic and atraumatic.  Eyes:     Pupils: Pupils are equal, round, and reactive to light.  Cardiovascular:     Rate and Rhythm: Normal rate and regular rhythm.     Heart sounds: Normal heart sounds. No murmur heard.   Pulmonary:     Effort: Pulmonary effort is normal.     Breath sounds: Normal breath sounds. No wheezing.  Abdominal:     General: Bowel sounds are normal. There is no distension.     Palpations: Abdomen is soft.     Tenderness: There is no abdominal tenderness.  Musculoskeletal:        General: Normal range of motion.     Cervical back: Normal range of motion.   Skin:    General: Skin is warm and dry.     Findings: No rash.  Neurological:     Mental Status: She is alert and oriented to person, place, and time.  Psychiatric:        Judgment: Judgment normal.      CMP Latest Ref Rng & Units 09/11/2019  Glucose 70 - 99 mg/dL 117(H)  BUN 8 - 23 mg/dL 18  Creatinine 0.44 - 1.00 mg/dL 0.85  Sodium 135 - 145 mmol/L 136  Potassium 3.5 - 5.1 mmol/L 3.9  Chloride 98 - 111 mmol/L 105  CO2 22 - 32 mmol/L 23  Calcium 8.9 - 10.3 mg/dL 8.9  Total Protein 6.5 - 8.1 g/dL 7.1  Total Bilirubin 0.3 - 1.2 mg/dL 0.6  Alkaline Phos 38 - 126 U/L 74  AST 15 - 41 U/L 27  ALT 0 - 44 U/L 21   CBC Latest Ref Rng & Units 09/11/2019  WBC 4.0 - 10.5 K/uL 8.2  Hemoglobin 12.0 - 15.0 g/dL 14.1  Hematocrit 36 - 46 % 40.6  Platelets 150 - 400 K/uL 333    No images are attached to the encounter.  US Abdomen Limited  Result Date: 09/07/2019 CLINICAL DATA:  Series carcinoma of gynecologic origin with ascites. Status post paracentesis on 08/27/2019 with removal 1.3 L of fluid at that time. EXAM: LIMITED ABDOMEN ULTRASOUND FOR ASCITES TECHNIQUE: Limited ultrasound survey for ascites was performed in all four abdominal quadrants. COMPARISON:  08/27/2019 FINDINGS: A large volume of ascites was not present by ultrasound with most of the fluid adjacent to the liver. Therapeutic paracentesis was deferred today. The patient is scheduled for peritoneal mass biopsy later today. IMPRESSION: Relatively small overall volume of ascites by ultrasound. Therapeutic paracentesis was deferred today. Electronically Signed   By: Aletta Edouard M.D.   On: 09/07/2019 11:12   US Paracentesis  Result Date: 08/27/2019 INDICATION: Abdominal distention and discomfort. Shortness of breath. Ascites. Request for diagnostic and therapeutic paracentesis. EXAM: ULTRASOUND GUIDED RIGHT LOWER QUADRANT PARACENTESIS MEDICATIONS: 1% plain lidocaine, 5 mL COMPLICATIONS: None immediate. PROCEDURE: Informed written  consent was obtained from the patient after a discussion of the risks, benefits and alternatives to treatment. A timeout was performed prior to the initiation of the procedure. Initial ultrasound scanning demonstrates a small to  moderate amount of ascites within the right lower abdominal quadrant. The right lower abdomen was prepped and draped in the usual sterile fashion. 1% lidocaine was used for local anesthesia. Following this, a 6 Fr Safe-T-Centesis catheter was introduced. An ultrasound image was saved for documentation purposes. The paracentesis was performed. The catheter was removed and a dressing was applied. The patient tolerated the procedure well without immediate post procedural complication. FINDINGS: A total of approximately 1250 mL of clear, amber colored fluid was removed. Samples were sent to the laboratory as requested by the clinical team. IMPRESSION: Successful ultrasound-guided paracentesis yielding 1250 mL of peritoneal fluid. Read by: Ascencion Dike PA-C Electronically Signed   By: Jerilynn Mages.  Shick M.D.   On: 08/27/2019 15:23   CT Biopsy  Result Date: 09/07/2019 CLINICAL DATA:  Pelvic mass, ascites and peritoneal masses. Cytologic analysis of ascites previously demonstrated malignant cells consistent with serous carcinoma. Additional soft tissue biopsy of a peritoneal mass has been requested for further molecular studies to characterize the carcinoma. EXAM: CT GUIDED CORE BIOPSY OF PERITONEAL MASS ANESTHESIA/SEDATION: 2.0 mg IV Versed; 100 mcg IV Fentanyl Total Moderate Sedation Time:  15 minutes. The patient's level of consciousness and physiologic status were continuously monitored during the procedure by Radiology nursing. PROCEDURE: The procedure risks, benefits, and alternatives were explained to the patient. Questions regarding the procedure were encouraged and answered. The patient understands and consents to the procedure. A time-out was performed prior to initiating the procedure. CT was  performed through the abdomen. The left upper lateral abdominal wall was prepped with chlorhexidine in a sterile fashion, and a sterile drape was applied covering the operative field. A sterile gown and sterile gloves were used for the procedure. Local anesthesia was provided with 1% Lidocaine. Under CT guidance, a 17 gauge trocar needle was advanced to the level of a left upper abdominal peritoneal mass. After confirming needle tip position, 4 separate coaxial 18 gauge core biopsy samples were obtained. Core biopsy samples were submitted in formalin. Gel-Foam pledgets were advanced through the trocar needle before removal. Additional CT was performed. COMPLICATIONS: None FINDINGS: Peritoneal mass located in the lateral aspect of the left upper quadrant lateral to the splenic flexure was localized. This mass measures roughly 2.9 x 5.5 cm in transverse dimensions. Biopsy yielded solid tissue. IMPRESSION: CT-guided core biopsy performed of peritoneal mass in the left upper quadrant. Electronically Signed   By: Aletta Edouard M.D.   On: 09/07/2019 14:43     Assessment and plan- Patient is a 64 y.o. female who presents to [facility] for initial meeting in preparation for starting chemotherapy for the treatment of ovarian cancer.   1. HPI: Mrs. Neale Burly is a 64 year old female with past medical history significant for CAD, hypertension, hepatitis C secondary to blood transfusion in the 90s, GERD, hyperlipidemia, depression who was recently diagnosed with ovarian cancer.  She initially presented with loss of appetite and abdominal distention along with nausea.  Had imaging that showed a mesenteric mass with lymph node involvement.  She is referred to Dr. Tollie Pizza for further evaluation.  Found to have ascites which tested positive for malignancy.  Her CA-125 was elevated.  She has been evaluated by gynecology oncology who agrees with neoadjuvant chemotherapy with carbo Taxol x3 cycles followed by surgery.   Patient has follow-up with Dr. Marius Ditch and GI for history of cirrhosis/hepatitis.  Unlikely ascites secondary to cirrhosis.  Final work-up is a PET scan and a genetic referral.  PET scan will take place  this week.  2. Chemo Care Clinic/High Risk for ER/Hospitalization during chemotherapy- We discussed the role of the chemo care clinic and identified patient specific risk factors. I discussed that patient was identified as high risk primarily based on: Stage of disease  Patient has past medical history positive for: Past Medical History:  Diagnosis Date  . Arthritis   . Hypertension     Patient has past surgical history positive for: Past Surgical History:  Procedure Laterality Date  . APPENDECTOMY      Based on our high risk symptom management report; this patient has a high risk of ED utilization.  The percentage below indicates how "at risk "  this patient based on the factors in this table within one year.   General Risk Score: 2  Values used to calculate this score:   Points  Metrics      0        Age: 65      0        Hospital Admissions: 0      0        ED Visits: 0      0        Has Chronic Obstructive Pulmonary Disease: No      0        Has Diabetes: No      0        Has Congestive Heart Failure: No      1        Has liver disease: Yes      1        Has Depression: Yes      0        Current PCP: Danelle Berry, NP      0        Has Medicaid: No  3. We discussed that social determinants of health may have significant impacts on health and outcomes for cancer patients.  Today we discussed specific social determinants of performance status, alcohol use, depression, financial needs, food insecurity, housing, interpersonal violence, social connections, stress, tobacco use, and transportation.    After lengthy discussion the following were identified as areas of need: None at this time  Outpatient services: We discussed options including home based and outpatient services, DME  and care program. We discusssed that patients who participate in regular physical activity report fewer negative impacts of cancer and treatments and report less fatigue.   Financial Concerns: We discussed that living with cancer can create tremendous financial burden.  We discussed options for assistance. I asked that if assistance is needed in affording medications or paying bills to please let us know so that we can provide assistance. We discussed options for food including social services, [employer] ($50 every 2 weeks) and onsite food pantry.  We will also notify [facility] to see if cancer center can provide additional support.  Referral to Social work: Introduced Education officer, museum Elease Etienne and the services he can provide such as support with [employer], cell phone and gas vouchers.   Support groups: We discussed options for support groups at the cancer center. If interested, please notify nurse navigator to enroll. We discussed options for managing stress including healthy eating, exercise as well as participating in no charge counseling services at the cancer center and support groups.  If these are of interest, patient can notify either myself or primary nursing team.We discussed options for management including medications and referral to quit Smart program  Transportation: We discussed options for transportation including acta, paratransit, bus routes, link transit, taxi/uber/lyft, and cancer center [place].  I have notified primary oncology team who will help assist with arranging Lucianne Lei transportation for appointments when/if needed. We also discussed options for transportation on short notice/acute visits.  Palliative care services: We have palliative care services available in the cancer center to discuss goals of care and advanced care planning.  Please let us know if you have any questions or would like to speak to our palliative nurse practitioner.  Symptom Management Clinic: We  discussed our symptom management clinic which is available for acute concerns while receiving treatment such as nausea, vomiting or diarrhea.  We can be reached via telephone at [phone] or through my chart.  We are available for virtual or in person visits on the same day from 830 to 4 PM Monday through Friday. She denies needing specific assistance at this time and She will be followed by Dr. Rogue Bussing clinical team.  Plan: Discussed symptom management clinic. Discussed palliative care services. Discussed resources that are available here at the cancer center. Discussed medications and new prescriptions to begin treatment such as anti-nausea or steroids.   Disposition: RTC on 09/12/2019 for EGD with Dr. Marius Ditch. RTC on 09/13/2019 for PET scan. RTC on 09/14/2019 for labs, MD assessment and cycle 1 of carbotaxol.  Visit Diagnosis 1. 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 Gynecologic cancer [facility])     Patient expressed understanding and was in agreement with this plan. She also understands that She can call clinic at any time with any questions, concerns, or complaints.   Greater than 50% was spent in counseling and coordination of care with this patient including but not limited to discussion of the relevant topics above (See A&P) including, but not limited to diagnosis and management of acute and chronic medical conditions.   [facility] at [place]  CC: Dr. Rogue Bussing

## 2019-09-11 NOTE — Telephone Encounter (Signed)
Patient called back around 1:15PM and left vm. Pt was called back and informed that our office was working on getting medication for Hep C and would call her back when further information had developed. FYI

## 2019-09-11 NOTE — Telephone Encounter (Signed)
Called and left a message for call back to inform patient that we would be working on getting her Hepatitis C medication for chronic Hepatitis C

## 2019-09-11 NOTE — Telephone Encounter (Signed)
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

## 2019-09-12 ENCOUNTER — Other Ambulatory Visit: Payer: Self-pay

## 2019-09-12 ENCOUNTER — Other Ambulatory Visit: Payer: Self-pay | Admitting: Internal Medicine

## 2019-09-12 ENCOUNTER — Ambulatory Visit: Payer: Medicare Other | Admitting: Certified Registered Nurse Anesthetist

## 2019-09-12 ENCOUNTER — Other Ambulatory Visit: Payer: Self-pay | Admitting: *Deleted

## 2019-09-12 ENCOUNTER — Encounter
Admission: RE | Disposition: A | Discharge status: Home / Self Care | Payer: Self-pay | Source: Home / Self Care | Attending: Gastroenterology

## 2019-09-12 ENCOUNTER — Ambulatory Visit
Admission: RE | Admit: 2019-09-12 | Discharge: 2019-09-12 | Disposition: A | Discharge status: Home / Self Care | Payer: Medicare Other | Attending: Gastroenterology | Admitting: Gastroenterology

## 2019-09-12 DIAGNOSIS — Z13810 Encounter for screening for upper gastrointestinal disorder: Secondary | ICD-10-CM | POA: Diagnosis not present

## 2019-09-12 DIAGNOSIS — K7460 Unspecified cirrhosis of liver: Secondary | ICD-10-CM | POA: Diagnosis not present

## 2019-09-12 DIAGNOSIS — K319 Disease of stomach and duodenum, unspecified: Secondary | ICD-10-CM | POA: Diagnosis not present

## 2019-09-12 DIAGNOSIS — K7469 Other cirrhosis of liver: Secondary | ICD-10-CM | POA: Diagnosis not present

## 2019-09-12 DIAGNOSIS — Z79899 Other long term (current) drug therapy: Secondary | ICD-10-CM | POA: Diagnosis not present

## 2019-09-12 DIAGNOSIS — C763 Malignant neoplasm of pelvis: Secondary | ICD-10-CM

## 2019-09-12 DIAGNOSIS — B182 Chronic viral hepatitis C: Secondary | ICD-10-CM | POA: Insufficient documentation

## 2019-09-12 DIAGNOSIS — F17210 Nicotine dependence, cigarettes, uncomplicated: Secondary | ICD-10-CM | POA: Insufficient documentation

## 2019-09-12 DIAGNOSIS — K3189 Other diseases of stomach and duodenum: Secondary | ICD-10-CM | POA: Diagnosis not present

## 2019-09-12 DIAGNOSIS — K759 Inflammatory liver disease, unspecified: Secondary | ICD-10-CM

## 2019-09-12 DIAGNOSIS — I10 Essential (primary) hypertension: Secondary | ICD-10-CM | POA: Insufficient documentation

## 2019-09-12 DIAGNOSIS — K2100 Gastro-esophageal reflux disease with esophagitis, without bleeding: Secondary | ICD-10-CM | POA: Insufficient documentation

## 2019-09-12 HISTORY — PX: ESOPHAGOGASTRODUODENOSCOPY (EGD) WITH PROPOFOL: SHX5813

## 2019-09-12 LAB — CA 125: Cancer Antigen (CA) 125: 1350 U/mL — ABNORMAL HIGH (ref 0.0–38.1)

## 2019-09-12 SURGERY — ESOPHAGOGASTRODUODENOSCOPY (EGD) WITH PROPOFOL
Anesthesia: General

## 2019-09-12 MED ORDER — LIDOCAINE HCL (CARDIAC) PF 100 MG/5ML IV SOSY
PREFILLED_SYRINGE | INTRAVENOUS | Status: DC | PRN
  Administered 2019-09-12: 50 mg via INTRAVENOUS

## 2019-09-12 MED ORDER — PROPOFOL 10 MG/ML IV BOLUS
INTRAVENOUS | Status: DC | PRN
  Administered 2019-09-12: 70 mg via INTRAVENOUS

## 2019-09-12 MED ORDER — PROPOFOL 500 MG/50ML IV EMUL
INTRAVENOUS | Status: DC | PRN
  Administered 2019-09-12: 150 ug/kg/min via INTRAVENOUS

## 2019-09-12 MED ORDER — LIDOCAINE HCL (PF) 2 % IJ SOLN
INTRAMUSCULAR | Status: AC
  Filled 2019-09-12: qty 5

## 2019-09-12 MED ORDER — PROPOFOL 500 MG/50ML IV EMUL
INTRAVENOUS | Status: AC
  Filled 2019-09-12: qty 50

## 2019-09-12 MED ORDER — OMEPRAZOLE 40 MG PO CPDR
40.0000 mg | DELAYED_RELEASE_CAPSULE | Freq: Two times a day (BID) | ORAL | 3 refills | Status: DC
Start: 2019-09-12 — End: 2020-04-16

## 2019-09-12 MED ORDER — SODIUM CHLORIDE 0.9 % IV SOLN
INTRAVENOUS | Status: DC
  Administered 2019-09-12: 20 mL/h via INTRAVENOUS

## 2019-09-12 NOTE — Op Note (Signed)
[facility] Gastroenterology Patient Name: Jeanette Allen Procedure Date: 09/12/2019 9:54 AM MRN: [id number] Account #: [id number] Date of Birth: 08-07-1955 Admit Type: Outpatient Age: 63 Room: [facility] ENDO ROOM 4 Gender: Female Note Status: Finalized Procedure:             Upper GI endoscopy Indications:           Cirrhosis rule out esophageal varices, Hepatitis rule                         out esophageal varices Providers:             Lin Landsman MD, MD Medicines:             Monitored Anesthesia Care Complications:         No immediate complications. Estimated blood loss: None. Procedure:             Pre-Anesthesia Assessment:                        - Prior to the procedure, a History and Physical was                         performed, and patient medications and allergies were                         reviewed. The patient is competent. The risks and                         benefits of the procedure and the sedation options and                         risks were discussed with the patient. All questions                         were answered and informed consent was obtained.                         Patient identification and proposed procedure were                         verified by the physician, the nurse, the                         anesthesiologist, the anesthetist and the technician                         in the pre-procedure area in the procedure room in the                         endoscopy suite. Mental Status Examination: alert and                         oriented. Airway Examination: normal oropharyngeal                         airway and neck mobility. Respiratory Examination:                         clear to auscultation. CV Examination:  normal.                         Prophylactic Antibiotics: The patient does not require                         prophylactic antibiotics. Prior Anticoagulants: The                         patient has taken no  previous anticoagulant or                         antiplatelet agents. ASA Grade Assessment: III - A                         patient with severe systemic disease. After reviewing                         the risks and benefits, the patient was deemed in                         satisfactory condition to undergo the procedure. The                         anesthesia plan was to use monitored anesthesia care                         (MAC). Immediately prior to administration of                         medications, the patient was re-assessed for adequacy                         to receive sedatives. The heart rate, respiratory                         rate, oxygen saturations, blood pressure, adequacy of                         pulmonary ventilation, and response to care were                         monitored throughout the procedure. The physical                         status of the patient was re-assessed after the                         procedure.                        After obtaining informed consent, the endoscope was                         passed under direct vision. Throughout the procedure,                         the patient's blood pressure, pulse, and oxygen  saturations were monitored continuously. The Endoscope                         was introduced through the mouth, and advanced to the                         second part of duodenum. The upper GI endoscopy was                         accomplished without difficulty. The patient tolerated                         the procedure well. Findings:      A medium-sized submucosal mass with no bleeding was found in the       duodenal bulb. Biopsies were taken with a cold forceps for histology.      The second portion of the duodenum was normal.      Multiple dispersed small erosions with no bleeding and no stigmata of       recent bleeding were found in the gastric antrum.      The cardia and gastric fundus were  normal on retroflexion.      LA Grade C (one or more mucosal breaks continuous between tops of 2 or       more mucosal folds, less than 75% circumference) esophagitis with no       bleeding was found at the gastroesophageal junction.      Esophagogastric landmarks were identified: the gastroesophageal junction       was found at 35 cm from the incisors.      The examined esophagus was normal. Impression:            - Rule out malignancy, duodenal mass. Biopsied.                        - Normal second portion of the duodenum.                        - Erosive gastropathy with no bleeding and no stigmata                         of recent bleeding.                        - LA Grade C reflux esophagitis with no bleeding.                        - Esophagogastric landmarks identified.                        - Normal esophagus. Recommendation:        - Await pathology results.                        - Discharge patient to home (with escort).                        - Resume previous diet today.                        - Continue present medications.                        -  Follow an antireflux regimen.                        - Use a proton pump inhibitor PO BID for 3 months. Procedure Code(s):     --- Professional ---                        [phone], Esophagogastroduodenoscopy, flexible,                         transoral; with biopsy, single or multiple Diagnosis Code(s):     --- Professional ---                        K31.89, Other diseases of stomach and duodenum                        K21.00, Gastro-esophageal reflux disease with                         esophagitis, without bleeding                        K74.60, Unspecified cirrhosis of liver                        K75.9, Inflammatory liver disease, unspecified CPT copyright 2019 American Medical Association. All rights reserved. The codes documented in this report are preliminary and upon coder review may  be revised to meet current  compliance requirements. Dr. Ulyess Mort Lin Landsman MD, MD 09/12/2019 10:10:28 AM This report has been signed electronically. Number of Addenda: 0 Note Initiated On: 09/12/2019 9:54 AM Estimated Blood Loss:  Estimated blood loss: none.      [facility]

## 2019-09-12 NOTE — Anesthesia Preprocedure Evaluation (Addendum)
Anesthesia Evaluation  Patient identified by MRN, date of birth, ID band Patient awake    Reviewed: Allergy & Precautions, NPO status , Patient's Chart, lab work & pertinent test results  History of Anesthesia Complications Negative for: history of anesthetic complications  Airway Mallampati: II       Dental   Pulmonary neg COPD, Current Smoker and Patient abstained from smoking.,           Cardiovascular hypertension, Pt. on medications (-) Past MI and (-) CHF (-) dysrhythmias (-) Valvular Problems/Murmurs     Neuro/Psych neg Seizures Depression    GI/Hepatic GERD  Medicated and Controlled,(+) Hepatitis -, C  Endo/Other  neg diabetes  Renal/GU negative Renal ROS     Musculoskeletal   Abdominal   Peds  Hematology   Anesthesia Other Findings   Reproductive/Obstetrics                            Anesthesia Physical Anesthesia Plan  ASA: III  Anesthesia Plan: General   Post-op Pain Management:    Induction: Intravenous  PONV Risk Score and Plan: 2 and Propofol infusion and TIVA  Airway Management Planned: Nasal Cannula  Additional Equipment:   Intra-op Plan:   Post-operative Plan:   Informed Consent: I have reviewed the patients History and Physical, chart, labs and discussed the procedure including the risks, benefits and alternatives for the proposed anesthesia with the patient or authorized representative who has indicated his/her understanding and acceptance.       Plan Discussed with:   Anesthesia Plan Comments:         Anesthesia Quick Evaluation

## 2019-09-12 NOTE — Anesthesia Procedure Notes (Signed)
Date/Time: 09/12/2019 9:57 AM Performed by: Johnna Acosta, CRNA Pre-anesthesia Checklist: Patient identified, Emergency Drugs available, Suction available, Patient being monitored and Timeout performed Patient Re-evaluated:Patient Re-evaluated prior to induction Oxygen Delivery Method: Nasal cannula Preoxygenation: Pre-oxygenation with 100% oxygen Induction Type: IV induction

## 2019-09-12 NOTE — Telephone Encounter (Signed)
Let us hold off on hep C treatment for now until okayed by her oncologist. She is starting her chemo on Friday this week   Thanks  RV

## 2019-09-12 NOTE — Anesthesia Postprocedure Evaluation (Signed)
Anesthesia Post Note  Patient: [name]  Procedure(s) Performed: ESOPHAGOGASTRODUODENOSCOPY (EGD) WITH PROPOFOL (N/A )  Patient location during evaluation: Endoscopy Anesthesia Type: General Level of consciousness: awake and alert Pain management: pain level controlled Vital Signs Assessment: post-procedure vital signs reviewed and stable Respiratory status: spontaneous breathing and respiratory function stable Cardiovascular status: stable Anesthetic complications: no   No complications documented.   Last Vitals:  Vitals:   09/12/19 1032 09/12/19 1042  BP: 138/79 (!) 168/61  Pulse: 78 73  Resp: 17 18  Temp:    SpO2: 98% 100%    Last Pain:  Vitals:   09/12/19 1042  TempSrc:   PainSc: 0-No pain                 Elijio Staples K

## 2019-09-12 NOTE — Transfer of Care (Signed)
Immediate Anesthesia Transfer of Care Note  Patient: Jeanette Allen  Procedure(s) Performed: ESOPHAGOGASTRODUODENOSCOPY (EGD) WITH PROPOFOL (N/A )  Patient Location: PACU  Anesthesia Type:General  Level of Consciousness: sedated  Airway & Oxygen Therapy: Patient Spontanous Breathing and Patient connected to nasal cannula oxygen  Post-op Assessment: Report given to RN and Post -op Vital signs reviewed and stable  Post vital signs: Reviewed and stable  Last Vitals:  Vitals Value Taken Time  BP 113/65 09/12/19 1012  Temp 35.9 C 09/12/19 1012  Pulse 79 09/12/19 1014  Resp 29 09/12/19 1014  SpO2 99 % 09/12/19 1014    Last Pain:  Vitals:   09/12/19 1012  TempSrc: Tympanic  PainSc: Asleep         Complications: No complications documented.

## 2019-09-12 NOTE — H&P (Signed)
Cephas Darby, MD [address]  [place]  [place], [place] [address]  Main: [phone]  Fax: [phone] Pager: [phone]  Primary Care Physician:  Danelle Berry, NP Primary Gastroenterologist:  Dr. Cephas Darby  Pre-Procedure History & Physical: HPI:  Jeanette Allen is a 64 y.o. female is here for an endoscopy.   Past Medical History:  Diagnosis Date   Arthritis    Hypertension     Past Surgical History:  Procedure Laterality Date   APPENDECTOMY      Prior to Admission medications   Medication Sig Start Date End Date Taking? Authorizing Provider  dicyclomine (BENTYL) 10 MG/5ML solution Take by mouth 4 (four) times daily -  before meals and at bedtime.   Yes [provider]  ergocalciferol (VITAMIN D2) 1.25 MG (50000 UT) capsule Take by mouth. 07/09/19  Yes [provider]  FLUoxetine (PROZAC) 20 MG capsule Take by mouth. 08/14/19  Yes [provider]  losartan (COZAAR) 50 MG tablet Take by mouth. 05/18/19  Yes [provider]  ondansetron (ZOFRAN) 8 MG tablet One pill every 8 hours as needed for nausea/vomitting. 09/04/19  Yes Cammie Sickle, MD  ondansetron (ZOFRAN-ODT) 4 MG disintegrating tablet Take 4 mg by mouth every 8 (eight) hours as needed for nausea or vomiting.   Yes [provider]  Pitavastatin Calcium (LIVALO) 4 MG TABS Take 4 mg by mouth daily.   Yes [provider]  prochlorperazine (COMPAZINE) 10 MG tablet Take 1 tablet (10 mg total) by mouth every 6 (six) hours as needed for nausea or vomiting. 09/04/19  Yes Cammie Sickle, MD    Allergies as of 09/03/2019   (No Known Allergies)    Family History  Problem Relation Age of Onset   Diabetes Maternal Grandmother    Hypertension Maternal Grandmother    Stroke Maternal Grandmother     Social History   Socioeconomic History   Marital status: Married    Spouse name: Not on file   Number of children: Not on file    Years of education: Not on file   Highest education level: Not on file  Occupational History   Not on file  Tobacco Use   Smoking status: Current Every Day Smoker   Smokeless tobacco: Never Used  Vaping Use   Vaping Use: Never used  Substance and Sexual Activity   Alcohol use: Not Currently   Drug use: Not on file   Sexual activity: Yes  Other Topics Concern   Not on file  Social History Narrative   Lives with husband; near [place]. Worked Network engineer at [employer]; smoke 1/2 ppd/slowed; no alcohol; 3 children [2 boys; one girl- [employer]]   Social Determinants of Health   Financial Resource Strain:    Difficulty of Paying Living Expenses: Not on file  Food Insecurity:    Worried About Charity fundraiser in the Last Year: Not on file   [employer] of Food in the Last Year: Not on file  Transportation Needs:    Lack of Transportation (Medical): Not on file   Lack of Transportation (Non-Medical): Not on file  Physical Activity:    Days of Exercise per Week: Not on file   Minutes of Exercise per Session: Not on file  Stress:    Feeling of Stress : Not on file  Social Connections:    Frequency of Communication with Friends and Family: Not on file   Frequency of Social Gatherings with  Friends and Family: Not on file   Attends Religious Services: Not on file   Active Member of Clubs or Organizations: Not on file   Attends Archivist Meetings: Not on file   Marital Status: Not on file  Intimate Partner Violence:    Fear of Current or Ex-Partner: Not on file   Emotionally Abused: Not on file   Physically Abused: Not on file   Sexually Abused: Not on file    Review of Systems: See HPI, otherwise negative ROS  Physical Exam: BP (!) 143/81    Pulse 83    Temp (!) 96.8 F (36 C) (Temporal)    Resp 20    Ht 5\' 4"  (1.626 m)    Wt 73.9 kg    SpO2 99%    BMI 27.98 kg/m  General:   Alert,  pleasant and cooperative in NAD Head:   Normocephalic and atraumatic. Neck:  Supple; no masses or thyromegaly. Lungs:  Clear throughout to auscultation.    Heart:  Regular rate and rhythm. Abdomen:  Soft, nontender and nondistended. Normal bowel sounds, without guarding, and without rebound.   Neurologic:  Alert and  oriented x4;  grossly normal neurologically.  Impression/Plan: [name] is here for an endoscopy to be performed for variceal screening, chronic hep C cirrhosis  Risks, benefits, limitations, and alternatives regarding  endoscopy have been reviewed with the patient.  Questions have been answered.  All parties agreeable.   Sherri Sear, MD  09/12/2019, 9:46 AM

## 2019-09-12 NOTE — Progress Notes (Signed)
M-vasi approved by insurance. Discussed with Dr.Secord. reviewed the EGD- no varices.   C- Also add MVASI to carbo-taxol on 9/10.    GB

## 2019-09-13 ENCOUNTER — Encounter: Payer: Self-pay | Admitting: Gastroenterology

## 2019-09-13 ENCOUNTER — Other Ambulatory Visit: Payer: Self-pay

## 2019-09-13 ENCOUNTER — Ambulatory Visit
Admission: RE | Admit: 2019-09-13 | Discharge: 2019-09-13 | Disposition: A | Discharge status: Home / Self Care | Payer: Medicare Other | Source: Ambulatory Visit | Attending: Internal Medicine | Admitting: Internal Medicine

## 2019-09-13 ENCOUNTER — Telehealth: Payer: Self-pay | Admitting: *Deleted

## 2019-09-13 DIAGNOSIS — C786 Secondary malignant neoplasm of retroperitoneum and peritoneum: Secondary | ICD-10-CM | POA: Insufficient documentation

## 2019-09-13 DIAGNOSIS — C801 Malignant (primary) neoplasm, unspecified: Secondary | ICD-10-CM | POA: Diagnosis not present

## 2019-09-13 DIAGNOSIS — R911 Solitary pulmonary nodule: Secondary | ICD-10-CM | POA: Diagnosis not present

## 2019-09-13 LAB — SURGICAL PATHOLOGY

## 2019-09-13 LAB — GLUCOSE, CAPILLARY: Glucose-Capillary: 102 mg/dL — ABNORMAL HIGH (ref 70–99)

## 2019-09-13 MED ORDER — FLUDEOXYGLUCOSE F - 18 (FDG) INJECTION
8.4000 | Freq: Once | INTRAVENOUS | Status: AC | PRN
  Administered 2019-09-13: 9.06 via INTRAVENOUS

## 2019-09-13 NOTE — Telephone Encounter (Signed)
Patient was prescribed a proton inhibitor by Dr. Marius Ditch. She wants to know if medication is approved by Dr. Roylene Reason.

## 2019-09-13 NOTE — Telephone Encounter (Signed)
Dr. B - please advise. 

## 2019-09-14 ENCOUNTER — Inpatient Hospital Stay: Payer: Medicare Other

## 2019-09-14 ENCOUNTER — Inpatient Hospital Stay (HOSPITAL_BASED_OUTPATIENT_CLINIC_OR_DEPARTMENT_OTHER): Payer: Medicare Other | Admitting: Internal Medicine

## 2019-09-14 ENCOUNTER — Encounter: Payer: Self-pay | Admitting: Internal Medicine

## 2019-09-14 ENCOUNTER — Telehealth: Payer: Self-pay

## 2019-09-14 VITALS — BP 165/89 | HR 89 | Resp 20

## 2019-09-14 DIAGNOSIS — C579 Malignant neoplasm of female genital organ, unspecified: Secondary | ICD-10-CM

## 2019-09-14 DIAGNOSIS — C763 Malignant neoplasm of pelvis: Secondary | ICD-10-CM

## 2019-09-14 DIAGNOSIS — Z5111 Encounter for antineoplastic chemotherapy: Secondary | ICD-10-CM | POA: Diagnosis not present

## 2019-09-14 DIAGNOSIS — Z7189 Other specified counseling: Secondary | ICD-10-CM

## 2019-09-14 LAB — COMPREHENSIVE METABOLIC PANEL
ALT: 21 U/L (ref 0–44)
AST: 30 U/L (ref 15–41)
Albumin: 3.5 g/dL (ref 3.5–5.0)
Alkaline Phosphatase: 71 U/L (ref 38–126)
Anion gap: 11 (ref 5–15)
BUN: 12 mg/dL (ref 8–23)
CO2: 24 mmol/L (ref 22–32)
Calcium: 9.2 mg/dL (ref 8.9–10.3)
Chloride: 104 mmol/L (ref 98–111)
Creatinine, Ser: 0.93 mg/dL (ref 0.44–1.00)
GFR calc Af Amer: >60 mL/min (ref >60)
GFR calc non Af Amer: >60 mL/min (ref >60)
Glucose, Bld: 104 mg/dL — ABNORMAL HIGH (ref 70–99)
Potassium: 4 mmol/L (ref 3.5–5.1)
Sodium: 139 mmol/L (ref 135–145)
Total Bilirubin: 0.5 mg/dL (ref 0.3–1.2)
Total Protein: 7.5 g/dL (ref 6.5–8.1)

## 2019-09-14 LAB — CBC WITH DIFFERENTIAL/PLATELET
Abs Immature Granulocytes: 0.01 10*3/uL (ref 0.00–0.07)
Basophils Absolute: 0 10*3/uL (ref 0.0–0.1)
Basophils Relative: 1 %
Eosinophils Absolute: 0.1 10*3/uL (ref 0.0–0.5)
Eosinophils Relative: 1 %
HCT: 41.3 % (ref 36.0–46.0)
Hemoglobin: 13.8 g/dL (ref 12.0–15.0)
Immature Granulocytes: 0 %
Lymphocytes Relative: 15 %
Lymphs Abs: 1 10*3/uL (ref 0.7–4.0)
MCH: 31.4 pg (ref 26.0–34.0)
MCHC: 33.4 g/dL (ref 30.0–36.0)
MCV: 94.1 fL (ref 80.0–100.0)
Monocytes Absolute: 0.6 10*3/uL (ref 0.1–1.0)
Monocytes Relative: 9 %
Neutro Abs: 5.3 10*3/uL (ref 1.7–7.7)
Neutrophils Relative %: 74 %
Platelets: 340 10*3/uL (ref 150–400)
RBC: 4.39 MIL/uL (ref 3.87–5.11)
RDW: 12.1 % (ref 11.5–15.5)
WBC: 7 10*3/uL (ref 4.0–10.5)
nRBC: 0 % (ref 0.0–0.2)

## 2019-09-14 MED ORDER — SODIUM CHLORIDE 0.9 % IV SOLN
10.0000 mg | Freq: Once | INTRAVENOUS | Status: AC
  Administered 2019-09-14: 10 mg via INTRAVENOUS
  Filled 2019-09-14: qty 10

## 2019-09-14 MED ORDER — SODIUM CHLORIDE 0.9 % IV SOLN
577.8000 mg | Freq: Once | INTRAVENOUS | Status: AC
  Administered 2019-09-14: 580 mg via INTRAVENOUS
  Filled 2019-09-14: qty 58

## 2019-09-14 MED ORDER — DIPHENHYDRAMINE HCL 50 MG/ML IJ SOLN
50.0000 mg | Freq: Once | INTRAMUSCULAR | Status: AC
  Administered 2019-09-14: 50 mg via INTRAVENOUS
  Filled 2019-09-14: qty 1

## 2019-09-14 MED ORDER — FAMOTIDINE IN NACL 20-0.9 MG/50ML-% IV SOLN
20.0000 mg | Freq: Once | INTRAVENOUS | Status: AC
  Administered 2019-09-14: 20 mg via INTRAVENOUS
  Filled 2019-09-14: qty 50

## 2019-09-14 MED ORDER — SODIUM CHLORIDE 0.9 % IV SOLN
Freq: Once | INTRAVENOUS | Status: AC
  Filled 2019-09-14: qty 250

## 2019-09-14 MED ORDER — PALONOSETRON HCL INJECTION 0.25 MG/5ML
0.2500 mg | Freq: Once | INTRAVENOUS | Status: AC
  Administered 2019-09-14: 0.25 mg via INTRAVENOUS
  Filled 2019-09-14: qty 5

## 2019-09-14 MED ORDER — SODIUM CHLORIDE 0.9 % IV SOLN
150.0000 mg | Freq: Once | INTRAVENOUS | Status: AC
  Administered 2019-09-14: 150 mg via INTRAVENOUS
  Filled 2019-09-14: qty 150

## 2019-09-14 MED ORDER — SODIUM CHLORIDE 0.9 % IV SOLN
175.0000 mg/m2 | Freq: Once | INTRAVENOUS | Status: AC
  Administered 2019-09-14: 318 mg via INTRAVENOUS
  Filled 2019-09-14: qty 53

## 2019-09-14 NOTE — Progress Notes (Signed)
[facility] CONSULT NOTE  Patient Care Team: Danelle Berry, NP as PCP - General (Nurse Practitioner) Clent Jacks, RN as Oncology Nurse Navigator  CHIEF COMPLAINTS/PURPOSE OF CONSULTATION: high grade serous cancer   #  Oncology History Overview Note  #August 2021-ascitic fluid cytology-high-grade serous carcinoma of gynecologic origin; CT scan-behind the abdominal wall 4.6 x 2.1 mass ; within the mesentery 6.7 x 4.5 cm left of mid abdomen . BIOPSY of the peritoneal mass; high grade carcinoma [phone] [Dr.Byrnett]; SEP 9th 2021- PET scan-shows peritoneal carcinomatosis; uterine uptake; sigmoid colonic uptake concerning for malignancy.  No ovarian uptake.  CEA 125 +1300; CEA-2.    #Hepatitis C/ectopic pregnancy/PRBC transfusion 1995-untreated.[Dr.Vanga]  #    # NGS/MOLECULAR TESTS: myRisk HRD; foundation one testing.    # PALLIATIVE CARE EVALUATION:  # PAIN MANAGEMENT:    DIAGNOSIS: Uterine cancer  STAGE:   IV      ;  GOALS: control  CURRENT/MOST RECENT THERAPY : Carbotaxol    Gynecologic cancer ([place])  09/04/2019 Initial Diagnosis   Gynecologic cancer ([place])   09/14/2019 -  Chemotherapy   The patient had dexamethasone (DECADRON) 4 MG tablet, 8 mg, Oral, Daily, 1 of 1 cycle, Start date: --, End date: -- palonosetron (ALOXI) injection 0.25 mg, 0.25 mg, Intravenous,  Once, 1 of 6 cycles Administration: 0.25 mg (09/14/2019) CARBOplatin (PARAPLATIN) 580 mg in sodium chloride 0.9 % 250 mL chemo infusion, 580 mg (100 % of original dose 577.8 mg), Intravenous,  Once, 1 of 6 cycles Dose modification:   (original dose 577.8 mg, Cycle 1) Administration: 580 mg (09/14/2019) fosaprepitant (EMEND) 150 mg in sodium chloride 0.9 % 145 mL IVPB, 150 mg, Intravenous,  Once, 1 of 6 cycles Administration: 150 mg (09/14/2019) PACLitaxel (TAXOL) 318 mg in sodium chloride 0.9 % 500 mL chemo infusion (> 72m/m2), 175 mg/m2 = 318 mg, Intravenous,  Once, 1 of 6  cycles Administration: 318 mg (09/14/2019)  for chemotherapy treatment.       HISTORY OF PRESENTING ILLNESS:  Jeanette [id number].  female with recently diagnosed high-grade serous carcinoma-gynecologic origin is here to review the results of the PET scan/proceed with chemotherapy.  Patient need to monitor underwent EGD given history of hep C.  Unremarkable.  Continues to complain of abdominal discomfort or distention.  Complains of abdominal bloating complains of reflux-like symptoms.  Patient recommended PPI by GI not started yet.   Review of Systems  Constitutional: Positive for malaise/fatigue. Negative for chills, diaphoresis, fever and weight loss.  HENT: Negative for nosebleeds and sore throat.   Eyes: Negative for double vision.  Respiratory: Negative for cough, hemoptysis, sputum production, shortness of breath and wheezing.   Cardiovascular: Negative for chest pain, palpitations, orthopnea and leg swelling.  Gastrointestinal: Positive for abdominal pain and nausea. Negative for blood in stool, constipation, diarrhea, heartburn, melena and vomiting.  Genitourinary: Negative for dysuria, frequency and urgency.  Musculoskeletal: Negative for back pain and joint pain.  Skin: Negative.  Negative for itching and rash.  Neurological: Negative for dizziness, tingling, focal weakness, weakness and headaches.  Endo/Heme/Allergies: Does not bruise/bleed easily.  Psychiatric/Behavioral: Negative for depression. The patient is not nervous/anxious and does not have insomnia.      MEDICAL HISTORY:  Past Medical History:  Diagnosis Date  . Arthritis   . Hypertension     SURGICAL HISTORY: Past Surgical History:  Procedure Laterality Date  . APPENDECTOMY    . ESOPHAGOGASTRODUODENOSCOPY (EGD) WITH PROPOFOL N/A 09/12/2019   Procedure: ESOPHAGOGASTRODUODENOSCOPY (  EGD) WITH PROPOFOL;  Surgeon: Lin Landsman, MD;  Location: [facility] ENDOSCOPY;  Service: Gastroenterology;  Laterality:  N/A;    SOCIAL HISTORY: Social History   Socioeconomic History  . Marital status: Married    Spouse name: Not on file  . Number of children: Not on file  . Years of education: Not on file  . Highest education level: Not on file  Occupational History  . Not on file  Tobacco Use  . Smoking status: Current Every Day Smoker  . Smokeless tobacco: Never Used  Vaping Use  . Vaping Use: Never used  Substance and Sexual Activity  . Alcohol use: Not Currently  . Drug use: Not on file  . Sexual activity: Yes  Other Topics Concern  . Not on file  Social History Narrative   Lives with husband; near [place]. 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 Difficulty of Paying Living Expenses: Not on file  Food Insecurity:   . Worried About Charity fundraiser in the Last Year: Not on file  . Ran Out of Food in the Last Year: Not on file  Transportation Needs:   . Lack of Transportation (Medical): Not on file  . Lack of Transportation (Non-Medical): Not on file  Physical Activity:   . Days of Exercise per Week: Not on file  . Minutes of Exercise per Session: Not on file  Stress:   . Feeling of Stress : Not on file  Social Connections:   . Frequency of Communication with Friends and Family: Not on file  . Frequency of Social Gatherings with Friends and Family: Not on file  . Attends Religious Services: Not on file  . Active Member of Clubs or Organizations: Not on file  . Attends Archivist Meetings: Not on file  . Marital Status: Not on file  Intimate Partner Violence:   . Fear of Current or Ex-Partner: Not on file  . Emotionally Abused: Not on file  . Physically Abused: Not on file  . Sexually Abused: Not on file    FAMILY HISTORY: Family History  Problem Relation Age of Onset  . Diabetes Maternal Grandmother   . Hypertension  Maternal Grandmother   . Stroke Maternal Grandmother     ALLERGIES:  has No Known Allergies.  MEDICATIONS:  Current Outpatient Medications  Medication Sig Dispense Refill  . dicyclomine (BENTYL) 10 MG/5ML solution Take by mouth 4 (four) times daily -  before meals and at bedtime.    . ergocalciferol (VITAMIN D2) 1.25 MG (50000 UT) capsule Take by mouth.    Marland Kitchen FLUoxetine (PROZAC) 20 MG capsule Take by mouth.    . losartan (COZAAR) 50 MG tablet Take by mouth.    . ondansetron (ZOFRAN) 8 MG tablet One pill every 8 hours as needed for nausea/vomitting. 60 tablet 1  . ondansetron (ZOFRAN-ODT) 4 MG disintegrating tablet Take 4 mg by mouth every 8 (eight) hours as needed for nausea or vomiting.    . Pitavastatin Calcium (LIVALO) 4 MG TABS Take 4 mg by mouth daily.    . prochlorperazine (COMPAZINE) 10 MG tablet Take 1 tablet (10 mg total) by mouth every 6 (six) hours as needed for nausea or vomiting. 40 tablet 1  . omeprazole (PRILOSEC) 40 MG capsule Take 1 capsule (40 mg total) by mouth 2 (two) times daily before a meal. (  Patient not taking: Reported on 09/14/2019) 30 capsule 3   No current facility-administered medications for this visit.      Marland Kitchen  PHYSICAL EXAMINATION: ECOG PERFORMANCE STATUS: 1 - Symptomatic but completely ambulatory  Vitals:   09/14/19 0824  BP: (!) 161/91  Pulse: 89  Resp: 16  Temp: 97.6 F (36.4 C)  SpO2: 98%   Filed Weights   09/14/19 0824  Weight: 164 lb (74.4 kg)    Physical Exam Constitutional:      Comments: Ambulating independently.  Accompanied by husband.  HENT:     Head: Normocephalic and atraumatic.     Mouth/Throat:     Pharynx: No oropharyngeal exudate.  Eyes:     Pupils: Pupils are equal, round, and reactive to light.  Cardiovascular:     Rate and Rhythm: Normal rate and regular rhythm.  Pulmonary:     Effort: Pulmonary effort is normal. No respiratory distress.     Breath sounds: Normal breath sounds. No wheezing.  Abdominal:      General: Bowel sounds are normal. There is distension.     Palpations: Abdomen is soft. There is no mass.     Tenderness: There is no abdominal tenderness. There is no guarding or rebound.  Musculoskeletal:        General: No tenderness. Normal range of motion.     Cervical back: Normal range of motion and neck supple.  Skin:    General: Skin is warm.  Neurological:     Mental Status: She is alert and oriented to person, place, and time.  Psychiatric:        Mood and Affect: Affect normal.      LABORATORY DATA:  I have reviewed the data as listed Lab Results  Component Value Date   WBC 7.0 09/14/2019   HGB 13.8 09/14/2019   HCT 41.3 09/14/2019   MCV 94.1 09/14/2019   PLT 340 09/14/2019   Recent Labs    09/07/19 1044 09/11/19 0841 09/14/19 0813  NA 138 136 139  K 4.0 3.9 4.0  CL 104 105 104  CO2 20* 23 24  GLUCOSE 91 117* 104*  BUN '14 18 12  ' CREATININE 0.72 0.85 0.93  CALCIUM 9.2 8.9 9.2  GFRNONAA >60 >60 >60  GFRAA >60 >60 >60  PROT  --  7.1 7.5  ALBUMIN  --  3.4* 3.5  AST  --  27 30  ALT  --  21 21  ALKPHOS  --  74 71  BILITOT  --  0.6 0.5    RADIOGRAPHIC STUDIES: I have personally reviewed the radiological images as listed and agreed with the findings in the report. NM PET Image Initial (PI) Skull Base To Thigh  Result Date: 09/13/2019 CLINICAL DATA:  Initial treatment strategy for peritoneal carcinomatosis of unknown primary. EXAM: NUCLEAR MEDICINE PET SKULL BASE TO THIGH TECHNIQUE: 9.06 mCi F-18 FDG was injected intravenously. Full-ring PET imaging was performed from the skull base to thigh after the radiotracer. CT data was obtained and used for attenuation correction and anatomic localization. Fasting blood glucose: 102 mg/dl COMPARISON:  Outside CT scan from [facility]. This is dated 08/22/2019 FINDINGS: Mediastinal blood pool activity: SUV max 1.52 Liver activity: SUV max NA NECK: No hypermetabolic lymph nodes in the neck. Incidental CT findings: none  CHEST: No breast masses, supraclavicular or axillary adenopathy. The thyroid gland is unremarkable. No enlarged or hypermetabolic mediastinal or hilar lymph nodes. Small bilateral pleural effusions are noted, left greater than right. There is  a 5 mm pulmonary nodule in the left upper lobe on image 78/3 which shows mild hypermetabolism with SUV max of 2.53. This could be a metastatic focus. No other definite pulmonary lesions. Incidental CT findings: Aortic and coronary artery calcifications are noted ABDOMEN/PELVIS: As demonstrated on the outside CT scan there is diffuse peritoneal carcinomatosis with extensive peritoneal implants, omental caking and peritoneal surface disease. Large area of omental caking in the left upper quadrant adjacent to the transverse colon has an SUV max of 9.6. 2.1 cm peritoneal implant just inferior to the antral region of the stomach has an SUV max of 12.57. Periportal adenopathy has an SUV max of 9.32. I do not see any intrahepatic metastatic disease. Peritoneal surface disease is noted. No pancreatic mass is identified. The uterus is markedly abnormal with findings suspicious for endometrial cancer. Diffuse irregular hypermetabolism noted with SUV max of 10.64. No ovarian lesions are identified. Area of moderate wall thickening involving the sigmoid colon demonstrates marked hypermetabolism with SUV max of 13.58. This could reflect the patient's primary tumor. Incidental CT findings: Moderate volume abdominal/pelvic ascites. SKELETON: No bone lesions are identified. Incidental CT findings: none IMPRESSION: 1. Diffuse and extensive peritoneal carcinomatosis as detailed above. The uterus and the sigmoid colon are abnormal and the primary neoplasm could be the sigmoid colon or possibly endometrial carcinoma. Given their close proximity it is also possible that this is sigmoid colon cancer invading the uterus or less likely vice versa. Recommend omental biopsy or sigmoidoscopy. 2. Single  left upper lobe pulmonary nodule is mildly hypermetabolic and could reflect metastatic disease. Electronically Signed   By: Marijo Sanes M.D.   On: 09/13/2019 12:16   US Abdomen Limited  Result Date: 09/07/2019 CLINICAL DATA:  Series carcinoma of gynecologic origin with ascites. Status post paracentesis on 08/27/2019 with removal 1.3 L of fluid at that time. EXAM: LIMITED ABDOMEN ULTRASOUND FOR ASCITES TECHNIQUE: Limited ultrasound survey for ascites was performed in all four abdominal quadrants. COMPARISON:  08/27/2019 FINDINGS: A large volume of ascites was not present by ultrasound with most of the fluid adjacent to the liver. Therapeutic paracentesis was deferred today. The patient is scheduled for peritoneal mass biopsy later today. IMPRESSION: Relatively small overall volume of ascites by ultrasound. Therapeutic paracentesis was deferred today. Electronically Signed   By: Aletta Edouard M.D.   On: 09/07/2019 11:12   US Paracentesis  Result Date: 08/27/2019 INDICATION: Abdominal distention and discomfort. Shortness of breath. Ascites. Request for diagnostic and therapeutic paracentesis. EXAM: ULTRASOUND GUIDED RIGHT LOWER QUADRANT PARACENTESIS MEDICATIONS: 1% plain lidocaine, 5 mL COMPLICATIONS: None immediate. PROCEDURE: Informed written consent was obtained from the patient after a discussion of the risks, benefits and alternatives to treatment. A timeout was performed prior to the initiation of the procedure. Initial ultrasound scanning demonstrates a small to moderate amount of ascites within the right lower abdominal quadrant. The right lower abdomen was prepped and draped in the usual sterile fashion. 1% lidocaine was used for local anesthesia. Following this, a 6 Fr Safe-T-Centesis catheter was introduced. An ultrasound image was saved for documentation purposes. The paracentesis was performed. The catheter was removed and a dressing was applied. The patient tolerated the procedure well without  immediate post procedural complication. FINDINGS: A total of approximately 1250 mL of clear, amber colored fluid was removed. Samples were sent to the laboratory as requested by the clinical team. IMPRESSION: Successful ultrasound-guided paracentesis yielding 1250 mL of peritoneal fluid. Read by: Ascencion Dike PA-C Electronically Signed   By: Jerilynn Mages.  Shick M.D.   On: 08/27/2019 15:23   CT Biopsy  Result Date: 09/07/2019 CLINICAL DATA:  Pelvic mass, ascites and peritoneal masses. Cytologic analysis of ascites previously demonstrated malignant cells consistent with serous carcinoma. Additional soft tissue biopsy of a peritoneal mass has been requested for further molecular studies to characterize the carcinoma. EXAM: CT GUIDED CORE BIOPSY OF PERITONEAL MASS ANESTHESIA/SEDATION: 2.0 mg IV Versed; 100 mcg IV Fentanyl Total Moderate Sedation Time:  15 minutes. The patient's level of consciousness and physiologic status were continuously monitored during the procedure by Radiology nursing. PROCEDURE: The procedure risks, benefits, and alternatives were explained to the patient. Questions regarding the procedure were encouraged and answered. The patient understands and consents to the procedure. A time-out was performed prior to initiating the procedure. CT was performed through the abdomen. The left upper lateral abdominal wall was prepped with chlorhexidine in a sterile fashion, and a sterile drape was applied covering the operative field. A sterile gown and sterile gloves were used for the procedure. Local anesthesia was provided with 1% Lidocaine. Under CT guidance, a 17 gauge trocar needle was advanced to the level of a left upper abdominal peritoneal mass. After confirming needle tip position, 4 separate coaxial 18 gauge core biopsy samples were obtained. Core biopsy samples were submitted in formalin. Gel-Foam pledgets were advanced through the trocar needle before removal. Additional CT was performed. COMPLICATIONS:  None FINDINGS: Peritoneal mass located in the lateral aspect of the left upper quadrant lateral to the splenic flexure was localized. This mass measures roughly 2.9 x 5.5 cm in transverse dimensions. Biopsy yielded solid tissue. IMPRESSION: CT-guided core biopsy performed of peritoneal mass in the left upper quadrant. Electronically Signed   By: Aletta Edouard M.D.   On: 09/07/2019 14:43    ASSESSMENT & PLAN:   Gynecologic cancer ([place]) #STAGE IV-High-grade serous carcinoma of gynecologic origin based on cytology/biopsy of the peritoneal mass. SEP 9th 2021- PET scan-shows peritoneal carcinomatosis; uterine uptake; sigmoid colonic uptake concerning for malignancy.  No ovarian uptake.  CEA 125 +1300; CEA-2.  Awaiting myRisk HRD; also would recommend adding foundation one testing.  #Recommend proceeding with carbotaxol chemotherapy-given the likely hood of endometrial primary based upon imaging.  Discussed that would recommend approximately 3 cycles of chemo; assess response before offering surgical options.  We will again discuss with GYN oncology.  #Sigmoid mass/uptake noted on PET scan-discussed with Dr.Vanga-plan for colonoscopy in the week of 24th [expect to have count recovery.].  Also discussed with Dr. Dicie Beam regarding the above pathology-again suspicious of GYN origin; however will add IHC stains to rule out lower GI primary.   #Again discussed the potential side effects including but not limited to-increasing fatigue, nausea vomiting, diarrhea, hair loss, sores in the mouth, increase risk of infection and also neuropathy.   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  EGD reviewed.  #Ascites-malignant/peritoneal carcinomatosis-discussed that would take about 2 or 3 cycles to improve symptomatically.  For now monitor closely.  #Abdominal discomfort/reflux-agree with PPI.  # DISPOSITION: # carbo-taxol today; NO MVASI # 10 days- labs-cbc/bmp # Follow up in 3 week- MD; labs-  CBC/CMP/ca-125- carbo-taxol; Dr.B  I had a long discussion with the patient's daughter Sonia-regarding the patient's diagnosis/treatment plan at length.  In agreement with the plan.  All questions were answered. The patient knows to call the clinic with any problems, questions or concerns.   Cammie Sickle, MD 09/17/2019 8:12 AM

## 2019-09-14 NOTE — Telephone Encounter (Signed)
-----   Message from Lin Landsman, MD sent at 09/13/2019  6:14 PM EDT ----- Regarding: Re: colonoscopy AshleyShe has a sigmoid mass on the PET scan.  She will be undergoing chemo from tomorrow.  Please schedule colonoscopy for the week of September 27 per Dr. Rogue Bussing  Thank youRohini

## 2019-09-14 NOTE — Assessment & Plan Note (Addendum)
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 SEP 9th 2021- PET scan-shows peritoneal carcinomatosis; uterine uptake; sigmoid colonic uptake concerning for malignancy.  No ovarian uptake.  CEA 125 +1300; CEA-2.  Awaiting myRisk HRD; also would recommend adding foundation one testing.  #Recommend proceeding with carbotaxol chemotherapy-given the likely hood of endometrial primary based upon imaging.  Discussed that would recommend approximately 3 cycles of chemo; assess response before offering surgical options.  We will again discuss with GYN oncology.  #Sigmoid mass/uptake noted on PET scan-discussed with Dr.Vanga-plan for colonoscopy in the week of 24th [expect to have count recovery.].  Also discussed with Dr. Dicie Beam regarding the above pathology-again suspicious of GYN origin; however will add IHC stains to rule out lower GI primary.   #Again discussed the potential side effects including but not limited to-increasing fatigue, nausea vomiting, diarrhea, hair loss, sores in the mouth, increase risk of infection and also neuropathy.   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  EGD reviewed.  #Ascites-malignant/peritoneal carcinomatosis-discussed that would take about 2 or 3 cycles to improve symptomatically.  For now monitor closely.  #Abdominal discomfort/reflux-agree with PPI.  # DISPOSITION: # carbo-taxol today; NO MVASI # 10 days- labs-cbc/bmp # Follow up in 3 week- MD; labs- CBC/CMP/ca-125- carbo-taxol; Dr.B  I had a long discussion with the patient's daughter Sonia-regarding the patient's diagnosis/treatment plan at length.  In agreement with the plan.

## 2019-09-14 NOTE — Telephone Encounter (Signed)
Patient verbalized understanding and states she is at the cancer center right now getting chemo. She states to call her back on Monday to scheduled procedure.

## 2019-09-15 LAB — CA 125: Cancer Antigen (CA) 125: 1501 U/mL — ABNORMAL HIGH (ref 0.0–38.1)

## 2019-09-17 ENCOUNTER — Telehealth: Payer: Self-pay

## 2019-09-17 LAB — SURGICAL PATHOLOGY

## 2019-09-17 NOTE — Telephone Encounter (Signed)
Telephone call to patient for follow up after receiving first infusion.   Patient states infusion went great.  States eating good and drinking plenty of fluids.   Denies any nausea or vomiting.  Encouraged patient to call for any concerns or questions. 

## 2019-09-17 NOTE — Telephone Encounter (Signed)
Received call from [name]. She feels like she is not sleeping very well and is asking if she can take advil pm. Referred to Beckey Rutter NP and she said that would be fine. I will also reach out to [facility] to get colonoscopy scheduled for the week on 9/27. When they called she was in infusion getting chemo.

## 2019-09-18 ENCOUNTER — Other Ambulatory Visit: Payer: Self-pay

## 2019-09-18 DIAGNOSIS — R948 Abnormal results of function studies of other organs and systems: Secondary | ICD-10-CM

## 2019-09-18 MED ORDER — NA SULFATE-K SULFATE-MG SULF 17.5-3.13-1.6 GM/177ML PO SOLN: 354.0000 mL | Freq: Once | ORAL | 0 refills | Status: AC

## 2019-09-18 NOTE — Telephone Encounter (Signed)
Called patient back and scheduled for 10/01/2019 went over instructions with patient and mailed them and sent to [facility]

## 2019-09-20 ENCOUNTER — Telehealth: Payer: Self-pay

## 2019-09-20 NOTE — Telephone Encounter (Signed)
Called and left voicemail with [name] to have [name] call me regarding plan of care. She is currently resting.

## 2019-09-20 NOTE — Telephone Encounter (Signed)
Ms. Eroh returned call. Updated on plan of care as discussed by Dr.'s Brahmanday/Berchuck. In regards to the PET activity within the uterus they would like to obtain an endometrial biopsy. This can be obtained in gyn oncology clinic. Educated further on endometrial biopsy. Scheduled for 9/22 at 1530.

## 2019-09-24 ENCOUNTER — Telehealth: Payer: Self-pay | Admitting: Internal Medicine

## 2019-09-24 NOTE — Telephone Encounter (Signed)
On 9/16-spoke to patient regarding my discussion with Dr. Brien Few on imaging most suggestive of endometrial primary.  Need for endometrial biopsy.  Recommend GYN oncology repeat evaluation.  For now proceed with carboplatin-Taxol every 3 weeks x 6.   Drue Dun- please order FOUNDATION ONE on the peritoneal Nodule Biopsy.   Thanks, GB

## 2019-09-25 NOTE — Telephone Encounter (Signed)
Per secure chat with pathology and Dr. Rogue Bussing, awaiting endometrial biopsy for potentially more tissue and to determine primary site (uterine vs. ovarian). Peritoneal biopsy is insufficient for [facility] and likely Foundation One.

## 2019-09-26 ENCOUNTER — Encounter: Payer: Self-pay | Admitting: Obstetrics and Gynecology

## 2019-09-26 ENCOUNTER — Inpatient Hospital Stay (HOSPITAL_BASED_OUTPATIENT_CLINIC_OR_DEPARTMENT_OTHER): Payer: Medicare Other | Admitting: Obstetrics and Gynecology

## 2019-09-26 ENCOUNTER — Other Ambulatory Visit: Payer: Self-pay

## 2019-09-26 ENCOUNTER — Telehealth: Payer: Self-pay | Admitting: *Deleted

## 2019-09-26 VITALS — BP 160/83 | HR 94 | Temp 98.0°F | Resp 20 | Wt 158.0 lb

## 2019-09-26 DIAGNOSIS — C763 Malignant neoplasm of pelvis: Secondary | ICD-10-CM

## 2019-09-26 DIAGNOSIS — C786 Secondary malignant neoplasm of retroperitoneum and peritoneum: Secondary | ICD-10-CM | POA: Diagnosis not present

## 2019-09-26 DIAGNOSIS — Z5111 Encounter for antineoplastic chemotherapy: Secondary | ICD-10-CM | POA: Diagnosis not present

## 2019-09-26 DIAGNOSIS — C801 Malignant (primary) neoplasm, unspecified: Secondary | ICD-10-CM | POA: Diagnosis not present

## 2019-09-26 LAB — CBC WITH DIFFERENTIAL/PLATELET
Abs Immature Granulocytes: 0.01 10*3/uL (ref 0.00–0.07)
Basophils Absolute: 0 10*3/uL (ref 0.0–0.1)
Basophils Relative: 1 %
Eosinophils Absolute: 0.1 10*3/uL (ref 0.0–0.5)
Eosinophils Relative: 2 %
HCT: 36.1 % (ref 36.0–46.0)
Hemoglobin: 12.2 g/dL (ref 12.0–15.0)
Immature Granulocytes: 0 %
Lymphocytes Relative: 31 %
Lymphs Abs: 1.1 10*3/uL (ref 0.7–4.0)
MCH: 31.1 pg (ref 26.0–34.0)
MCHC: 33.8 g/dL (ref 30.0–36.0)
MCV: 92.1 fL (ref 80.0–100.0)
Monocytes Absolute: 0.5 10*3/uL (ref 0.1–1.0)
Monocytes Relative: 14 %
Neutro Abs: 1.8 10*3/uL (ref 1.7–7.7)
Neutrophils Relative %: 52 %
Platelets: 202 10*3/uL (ref 150–400)
RBC: 3.92 MIL/uL (ref 3.87–5.11)
RDW: 13.2 % (ref 11.5–15.5)
WBC: 3.5 10*3/uL — ABNORMAL LOW (ref 4.0–10.5)
nRBC: 0 % (ref 0.0–0.2)

## 2019-09-26 NOTE — Telephone Encounter (Signed)
Faxed orders for omniseq to [facility] and also copy of orders to [facility]. 1636 on 09/26/2019. Fax conf. Received.

## 2019-09-26 NOTE — Progress Notes (Signed)
Gynecologic Oncology Interval Visit   Referring Provider: Dr. 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 female who is seen in consultation from Dr. Charlynn Grimes and Bary Castilla for elevated CA125 and ascites. She has a medical history significant for Hepatitis C (ectopic pregnancy in 1990 requiring transfusion).   She presents today for EMBx given abnormal PET findings suspicious for uterine primary.   Since her last visit she has had several procedures and tests.   09/07/2019 biopsy of peritoneal mass DIAGNOSIS:  A. PERITONEAL MASS, LEFT LATERAL; CT-GUIDED CORE BIOPSY:  - METASTATIC HIGH-GRADE SEROUS CARCINOMA.   09/12/2019 DUODENAL BULB; COLD BIOPSY DIAGNOSIS:  A. DUODENAL BULB; COLD BIOPSY:  - DUODENAL MUCOSA DISPLAYING NORMAL VILLOUS ARCHITECTURE AND MILD  BRUNNER GLAND HYPERPLASIA.  - NEGATIVE FOR FEATURES OF CELIAC, DYSPLASIA, AND MALIGNANCY.   PET 09/13/2019 IMPRESSION: 1. Diffuse and extensive peritoneal carcinomatosis as detailed above. The uterus and the sigmoid colon are abnormal and the primary neoplasm could be the sigmoid colon or possibly endometrial carcinoma. Given their close proximity it is also possible that this is sigmoid colon cancer invading the uterus or less likely vice versa. Recommend omental biopsy or sigmoidoscopy. 2. Single left upper lobe pulmonary nodule is mildly hypermetabolic and could reflect metastatic disease.  She started chemotherapy with cycle #1 9/10/201 paclitaxel/carboplatin  Gynecologic Oncology History Jeanette Allen is a pleasant female who is seen in consultation from Dr. Charlynn Grimes and Bary Castilla for elevated CA125 and ascites. She has a medical history significant for Hepatitis C (ectopic pregnancy in 1990 requiring transfusion).  Her history is as follows:  She presented with abdominal pain for 2-3 weeks, daily nausea (episode of emesis of CT  contrast), early satiety and abdominal bloating.   .  08/20/2019 US Abdomen  Cirrhotic liver with small amount of perihepatic ascites.   Multiple peripancreatic and perihepatic hypoechoic nodules, the largest of which measures 2.0 cm at the inferior liver edge. Recommend CT for further evaluation.  08/22/2019 The patient underwent a contrast-enhanced CT. This described possibility of gas around the gallbladder. Cholecystitis is a consideration.  Extensive abdominal and pelvic ascites. No retroperitoneal masses. No evidence of an acute inflammatory process. Suspected uterine fibroid. No free air.   08/23/2019 She saw Dr. Bary Castilla for evaluation given gallbladder findings.   Labs: elevated hepatitis C virus antibody. Liver function with elevated transaminases. Normal total bilirubin, albumin and electrolytes. Normal renal function. CBC showed a hemoglobin of 15.6 with an MCV of 95, white blood cell count of 6500 with normal differential. Hemoglobin A1c 5.5.  CA125= 1228; CEA= 2.1; alpha-fetoprotein = 2,2, and CA19-9 <2.    08/27/2019 Paracentesis total of approximately 1250 mL of clear, amber colored fluid was Removed. LDH 334; ANC42; total nucleated cell 3,035; No WBC; No organisms; No growth; Albumin 2.8; total protein 5.0; Glucose 98. Cytology- POSITIVE FOR MALIGNANCY. - COMPATIBLE WITH HIGH-GRADE SEROUS CARCINOMA.   Genetic testing has already been ordered by Dr. Rogue Bussing. She saw genetic counselor on 09/06/2019.  Tumor testing: plan to order OmniSeq per Dr. Rogue Bussing.    COVID vaccination 05/24/2019 and 06/21/2019  Problem List: Patient Active Problem List   Diagnosis Date Noted  . 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 Gynecologic cancer ([place]) 09/04/2019  . Goals of care, counseling/discussion 09/04/2019  . Hepatitis C infection 08/29/2019  . CAD (coronary artery disease) 08/29/2019  . Depression 08/29/2019  . Esophageal reflux 08/29/2019  . Hyperlipidemia  08/29/2019  . Hypertension 08/29/2019  . Nonrheumatic mitral valve disorder 08/29/2019  . Vitamin B12 deficiency 08/29/2019  . Vitamin D deficiency 08/29/2019  . Tobacco abuse 08/29/2019    Past Medical History: Past Medical History:  Diagnosis Date  . Arthritis   . Hypertension     Past Surgical History: Past Surgical History:  Procedure Laterality Date  . APPENDECTOMY    . ESOPHAGOGASTRODUODENOSCOPY (EGD) WITH PROPOFOL N/A 09/12/2019   Procedure: ESOPHAGOGASTRODUODENOSCOPY (EGD) WITH PROPOFOL;  Surgeon: Lin Landsman, MD;  Location: [facility];  Service: Gastroenterology;  Laterality: N/A;  colonoscopy 2011 Tubal pregnancy 1990 Tubal reversal  Past Gynecologic History:  Menarche: age 30 Last menstrual period: age 83   OB History:  OB History  Gravida Para Term Preterm AB Living  _0 SAB TAB Ectopic Multiple Live Births      1        # Outcome Date GA Lbr Len/2nd Weight Sex Delivery Anes PTL Lv  4 Ectopic           3 Para           2 Para           1 Para             Family History: Family History  Problem Relation Age of Onset  . Diabetes Maternal Grandmother   . Hypertension Maternal Grandmother   . Stroke Maternal Grandmother     Social History: Social History   Socioeconomic History  . Marital status: Married    Spouse name: Not on file  . Number of children: Not on file  . Years of education: Not on file  . Highest education level: Not on file  Occupational History  . Not on file  Tobacco Use  . Smoking status: Current Every Day Smoker  . Smokeless tobacco: Never Used  Vaping Use  . Vaping Use: Never used  Substance and Sexual Activity  . Alcohol use: Not Currently  . Drug use: Not on file  . Sexual activity: Yes  Other Topics Concern  . Not on file  Social History Narrative   Lives with husband; near [place]. Worked Network engineer at [employer]; smoke 1/2 ppd/slowed; no alcohol; 3 children [2 boys; one girl- [employer]]   Social Determinants of Health   Financial Resource Strain:   . Difficulty of Paying Living Expenses: Not on file  Food Insecurity:   . Worried About Charity fundraiser in the Last Year: Not on file  . Ran Out of Food in the Last Year: Not on file  Transportation Needs:   . Lack of Transportation (Medical): Not on file  . Lack of Transportation (Non-Medical): Not on file  Physical Activity:   . Days of Exercise per Week: Not on file  . Minutes of Exercise per Session: Not on file  Stress:   . Feeling of Stress : Not on file  Social Connections:   . Frequency of Communication with Friends and Family: Not on file  . Frequency of Social Gatherings with Friends and Family: Not on file  . Attends Religious Services: Not on file  . Active Member of Clubs or Organizations: Not on file  . Attends Archivist Meetings: Not on file  . Marital Status: Not on file  Intimate Partner Violence:   . Fear of Current or Ex-Partner: Not on file  . Emotionally Abused: Not on file  . Physically  Abused: Not on file  . Sexually Abused: Not on file    Allergies: No Known Allergies  Current Medications: Current Outpatient Medications  Medication Sig Dispense Refill  . ergocalciferol (VITAMIN D2) 1.25 MG (50000 UT) capsule Take by mouth.    Marland Kitchen FLUoxetine (PROZAC) 20 MG capsule Take by mouth.    . losartan (COZAAR) 50 MG tablet Take by mouth.    Marland Kitchen omeprazole (PRILOSEC) 40 MG capsule Take 1 capsule (40 mg total) by mouth 2 (two) times daily before a meal. 30 capsule 3  . ondansetron (ZOFRAN) 8 MG tablet One pill every 8 hours as needed for nausea/vomitting. 60 tablet 1  . ondansetron (ZOFRAN-ODT) 4 MG disintegrating tablet Take 4 mg by mouth every 8 (eight) hours as needed for nausea or vomiting.    . Pitavastatin Calcium (LIVALO) 4 MG TABS Take 4 mg by mouth daily.    . prochlorperazine (COMPAZINE) 10 MG tablet Take 1 tablet (10 mg total) by mouth every 6 (six) hours as needed for  nausea or vomiting. 40 tablet 1  . dicyclomine (BENTYL) 10 MG/5ML solution Take by mouth 4 (four) times daily -  before meals and at bedtime. (Patient not taking: Reported on 09/25/2019)     No current facility-administered medications for this visit.      Objective:  Physical Examination:  BP (!) 160/83   Pulse 94   Temp 98 F (36.7 C)   Resp 20   Wt 158 lb (71.7 kg)   SpO2 100%   BMI 27.12 kg/m    ECOG Performance Status: 1 - Symptomatic but completely ambulatory  GENERAL: Patient is a well appearing female in no acute distress ABDOMEN: distended with obvious ascites; no obvious masses, unable to assess for hepatosplenomegaly. No obvious hernias.  NEURO:  Nonfocal. Well oriented.  Appropriate affect.  Pelvic: exam chaperoned by RN and CMA.   Consented for [facility]  EMBx performed.   The risks and benefits of the procedure were reviewed and informed consent obtained. Time out was performed. The patient received pre-procedure teaching and expressed understanding. The post-procedure instructions were reviewed with the patient and she expressed understanding. The patient does not have any barriers to learning.  BME: Uterus: not grossly enlarged but limited exam due to ascites. BME: firm plaque like midline mass in the cul-de-sac measuring ~3 cm and firm. The cervix is displaced anteriorly.   Speculum placed in the vaginal vault and cervix identified. Cervix cleansed with Betadine The internal os was stenotic and would not allow the pipelle to advance. The os could not be dilated with the os finder. The procedure was aborted.  Post-procedure evaluation the patient was stable without complaints.   Lab Review  Lab Results  Component Value Date   WBC 3.5 (L) 09/26/2019   HGB 12.2 09/26/2019   HCT 36.1 09/26/2019   MCV 92.1 09/26/2019   PLT 202 09/26/2019     Radiologic Imaging: PET images reviewed with Dr. Rogue Bussing    Assessment:  Jeanette Allen is a 64 y.o. 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 PET imaging concerning for endometrial primary. EMBx could not be performed due to cervical stenosis. S/p one cycle paclitaxel/carboplatin.   Hepatitis C, elevated LFTs, normal bilirubin and albumin.   Medical co-morbidities complicating care: HTN and prior abdominal surgery and Hepatitis C.  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 Recommend continued chemotherapy with Dr. Rogue Bussing. Follow up tumor and genetic testing. If tumor testing demonstrates ERBB2/ERBB3 amplification could consider adding trastuzumab.   Reassess after cycle 3 with imaging and exam. Even if she has primary endometrial cancer if she is having an excellent response to chemotherapy we can still consider interval debulking surgery.   Hepatitis C. If surgery is an option we will coordinate care with GI.   Suggested return to clinic in ~  8-9 weeks.   A total of 20 minutes were spent with the patient/family today; >50% was spent in education, counseling and coordination of care for cancer.   Doyl Bitting Gaetana Michaelis, MD

## 2019-09-27 ENCOUNTER — Other Ambulatory Visit
Admission: RE | Admit: 2019-09-27 | Discharge: 2019-09-27 | Disposition: A | Discharge status: Home / Self Care | Payer: Medicare Other | Source: Ambulatory Visit | Attending: Vascular Surgery | Admitting: Vascular Surgery

## 2019-09-27 ENCOUNTER — Telehealth: Payer: Self-pay | Admitting: *Deleted

## 2019-09-27 DIAGNOSIS — Z01812 Encounter for preprocedural laboratory examination: Secondary | ICD-10-CM | POA: Diagnosis present

## 2019-09-27 DIAGNOSIS — Z20822 Contact with and (suspected) exposure to covid-19: Secondary | ICD-10-CM | POA: Diagnosis not present

## 2019-09-27 LAB — SARS CORONAVIRUS 2 (TAT 6-24 HRS): SARS Coronavirus 2: NEGATIVE

## 2019-09-27 NOTE — Telephone Encounter (Signed)
Spoke with Dr. Rogue Bussing- v/o to order omniseq insight. I spoke with the integrated oncology. Orders will process.

## 2019-09-27 NOTE — Telephone Encounter (Signed)
rcvd incoming call from [employer]. they no longer offer the omniseq adv. you need to order the omniseq insight. is this ok? i can call them back. Dr. Jacinto Reap please advise

## 2019-09-28 ENCOUNTER — Other Ambulatory Visit: Payer: Self-pay

## 2019-09-28 ENCOUNTER — Inpatient Hospital Stay: Payer: Medicare Other

## 2019-09-28 DIAGNOSIS — Z5111 Encounter for antineoplastic chemotherapy: Secondary | ICD-10-CM | POA: Diagnosis not present

## 2019-09-28 DIAGNOSIS — C579 Malignant neoplasm of female genital organ, unspecified: Secondary | ICD-10-CM

## 2019-09-28 LAB — BASIC METABOLIC PANEL
Anion gap: 8 (ref 5–15)
BUN: 14 mg/dL (ref 8–23)
CO2: 26 mmol/L (ref 22–32)
Calcium: 9 mg/dL (ref 8.9–10.3)
Chloride: 104 mmol/L (ref 98–111)
Creatinine, Ser: 0.73 mg/dL (ref 0.44–1.00)
GFR calc Af Amer: >60 mL/min (ref >60)
GFR calc non Af Amer: >60 mL/min (ref >60)
Glucose, Bld: 106 mg/dL — ABNORMAL HIGH (ref 70–99)
Potassium: 4 mmol/L (ref 3.5–5.1)
Sodium: 138 mmol/L (ref 135–145)

## 2019-09-28 LAB — CBC WITH DIFFERENTIAL/PLATELET
Abs Immature Granulocytes: 0.01 10*3/uL (ref 0.00–0.07)
Basophils Absolute: 0 10*3/uL (ref 0.0–0.1)
Basophils Relative: 1 %
Eosinophils Absolute: 0.1 10*3/uL (ref 0.0–0.5)
Eosinophils Relative: 2 %
HCT: 37.1 % (ref 36.0–46.0)
Hemoglobin: 12.7 g/dL (ref 12.0–15.0)
Immature Granulocytes: 0 %
Lymphocytes Relative: 36 %
Lymphs Abs: 1.1 10*3/uL (ref 0.7–4.0)
MCH: 31.7 pg (ref 26.0–34.0)
MCHC: 34.2 g/dL (ref 30.0–36.0)
MCV: 92.5 fL (ref 80.0–100.0)
Monocytes Absolute: 0.4 10*3/uL (ref 0.1–1.0)
Monocytes Relative: 13 %
Neutro Abs: 1.5 10*3/uL — ABNORMAL LOW (ref 1.7–7.7)
Neutrophils Relative %: 48 %
Platelets: 189 10*3/uL (ref 150–400)
RBC: 4.01 MIL/uL (ref 3.87–5.11)
RDW: 13.2 % (ref 11.5–15.5)
WBC: 3.1 10*3/uL — ABNORMAL LOW (ref 4.0–10.5)
nRBC: 0 % (ref 0.0–0.2)

## 2019-10-01 ENCOUNTER — Encounter
Admission: RE | Disposition: A | Discharge status: Home / Self Care | Payer: Self-pay | Source: Home / Self Care | Attending: Gastroenterology

## 2019-10-01 ENCOUNTER — Ambulatory Visit
Admission: RE | Admit: 2019-10-01 | Discharge: 2019-10-01 | Disposition: A | Discharge status: Home / Self Care | Payer: Medicare Other | Attending: Gastroenterology | Admitting: Gastroenterology

## 2019-10-01 ENCOUNTER — Ambulatory Visit: Payer: Medicare Other | Admitting: Certified Registered"

## 2019-10-01 ENCOUNTER — Other Ambulatory Visit: Payer: Self-pay

## 2019-10-01 ENCOUNTER — Encounter: Payer: Self-pay | Admitting: Gastroenterology

## 2019-10-01 DIAGNOSIS — I10 Essential (primary) hypertension: Secondary | ICD-10-CM | POA: Insufficient documentation

## 2019-10-01 DIAGNOSIS — K644 Residual hemorrhoidal skin tags: Secondary | ICD-10-CM | POA: Insufficient documentation

## 2019-10-01 DIAGNOSIS — K5730 Diverticulosis of large intestine without perforation or abscess without bleeding: Secondary | ICD-10-CM | POA: Diagnosis not present

## 2019-10-01 DIAGNOSIS — K635 Polyp of colon: Secondary | ICD-10-CM

## 2019-10-01 DIAGNOSIS — D123 Benign neoplasm of transverse colon: Secondary | ICD-10-CM | POA: Insufficient documentation

## 2019-10-01 DIAGNOSIS — F17200 Nicotine dependence, unspecified, uncomplicated: Secondary | ICD-10-CM | POA: Insufficient documentation

## 2019-10-01 DIAGNOSIS — R933 Abnormal findings on diagnostic imaging of other parts of digestive tract: Secondary | ICD-10-CM | POA: Diagnosis present

## 2019-10-01 DIAGNOSIS — Z79899 Other long term (current) drug therapy: Secondary | ICD-10-CM | POA: Insufficient documentation

## 2019-10-01 DIAGNOSIS — R948 Abnormal results of function studies of other organs and systems: Secondary | ICD-10-CM

## 2019-10-01 HISTORY — PX: COLONOSCOPY WITH PROPOFOL: SHX5780

## 2019-10-01 SURGERY — COLONOSCOPY WITH PROPOFOL
Anesthesia: General

## 2019-10-01 MED ORDER — GLYCOPYRROLATE 0.2 MG/ML IJ SOLN
INTRAMUSCULAR | Status: DC | PRN
  Administered 2019-10-01 (×2): .1 mg via INTRAVENOUS

## 2019-10-01 MED ORDER — PROPOFOL 10 MG/ML IV BOLUS
INTRAVENOUS | Status: DC | PRN
  Administered 2019-10-01: 50 mg via INTRAVENOUS

## 2019-10-01 MED ORDER — SODIUM CHLORIDE 0.9 % IV SOLN: INTRAVENOUS | Status: DC

## 2019-10-01 MED ORDER — PROPOFOL 10 MG/ML IV BOLUS
INTRAVENOUS | Status: AC
  Filled 2019-10-01: qty 20

## 2019-10-01 MED ORDER — ONDANSETRON HCL 4 MG/2ML IJ SOLN
INTRAMUSCULAR | Status: DC | PRN
  Administered 2019-10-01: 4 mg via INTRAVENOUS

## 2019-10-01 MED ORDER — ONDANSETRON HCL 4 MG/2ML IJ SOLN
INTRAMUSCULAR | Status: AC
  Filled 2019-10-01: qty 2

## 2019-10-01 MED ORDER — PROPOFOL 500 MG/50ML IV EMUL
INTRAVENOUS | Status: DC | PRN
  Administered 2019-10-01: 120 ug/kg/min via INTRAVENOUS

## 2019-10-01 NOTE — Anesthesia Preprocedure Evaluation (Signed)
Anesthesia Evaluation  Patient identified by MRN, date of birth, ID band Patient awake    Reviewed: Allergy & Precautions, NPO status , Patient's Chart, lab work & pertinent test results  History of Anesthesia Complications Negative for: history of anesthetic complications  Airway Mallampati: III       Dental  (+) Partial Upper   Pulmonary neg sleep apnea, neg COPD, Current Smoker and Patient abstained from smoking.,           Cardiovascular hypertension, Pt. on medications (-) Past MI and (-) CHF (-) dysrhythmias      Neuro/Psych neg Seizures Depression    GI/Hepatic GERD  Medicated and Controlled,(+) Hepatitis -, C  Endo/Other  neg diabetes  Renal/GU negative Renal ROS     Musculoskeletal   Abdominal   Peds  Hematology   Anesthesia Other Findings   Reproductive/Obstetrics                             Anesthesia Physical Anesthesia Plan  ASA: III  Anesthesia Plan:    Post-op Pain Management:    Induction: Intravenous  PONV Risk Score and Plan: 1 and Ondansetron  Airway Management Planned: Nasal Cannula  Additional Equipment:   Intra-op Plan:   Post-operative Plan:   Informed Consent: I have reviewed the patients History and Physical, chart, labs and discussed the procedure including the risks, benefits and alternatives for the proposed anesthesia with the patient or authorized representative who has indicated his/her understanding and acceptance.       Plan Discussed with:   Anesthesia Plan Comments:         Anesthesia Quick Evaluation

## 2019-10-01 NOTE — Op Note (Addendum)
[facility] Gastroenterology Patient Name: Jeanette Allen Procedure Date: 10/01/2019 9:35 AM MRN: [id number] Account #: [id number] Date of Birth: 1955-10-15 Admit Type: Outpatient Age: 64 Room: [facility] ENDO ROOM 1 Gender: Female Note Status: Finalized Procedure:             Colonoscopy Indications:           Abnormal CT of the GI tract Providers:             Lin Landsman MD, MD Medicines:             Monitored Anesthesia Care Complications:         No immediate complications. Estimated blood loss: None. Procedure:             Pre-Anesthesia Assessment:                        - Prior to the procedure, a History and Physical was                         performed, and patient medications and allergies were                         reviewed. The patient is competent. The risks and                         benefits of the procedure and the sedation options and                         risks were discussed with the patient. All questions                         were answered and informed consent was obtained.                         Patient identification and proposed procedure were                         verified by the physician, the nurse, the                         anesthesiologist, the anesthetist and the technician                         in the pre-procedure area in the procedure room in the                         endoscopy suite. Mental Status Examination: alert and                         oriented. Airway Examination: normal oropharyngeal                         airway and neck mobility. Respiratory Examination:                         clear to auscultation. CV Examination: normal.                         Prophylactic Antibiotics: The patient does  not require                         prophylactic antibiotics. Prior Anticoagulants: The                         patient has taken no previous anticoagulant or                         antiplatelet agents. ASA Grade  Assessment: III - A                         patient with severe systemic disease. After reviewing                         the risks and benefits, the patient was deemed in                         satisfactory condition to undergo the procedure. The                         anesthesia plan was to use monitored anesthesia care                         (MAC). Immediately prior to administration of                         medications, the patient was re-assessed for adequacy                         to receive sedatives. The heart rate, respiratory                         rate, oxygen saturations, blood pressure, adequacy of                         pulmonary ventilation, and response to care were                         monitored throughout the procedure. The physical                         status of the patient was re-assessed after the                         procedure.                        After obtaining informed consent, the colonoscope was                         passed under direct vision. Throughout the procedure,                         the patient's blood pressure, pulse, and oxygen                         saturations were monitored continuously. The  Colonoscope was introduced through the anus and                         advanced to the the cecum, identified by appendiceal                         orifice and ileocecal valve. The colonoscopy was                         extremely difficult due to multiple diverticula in the                         colon, inadequate bowel prep and restricted mobility                         of the colon. Successful completion of the procedure                         was aided by using manual pressure, withdrawing the                         scope and replacing with the adult endoscope and                         withdrawing the scope and replacing with the                         enteroscope. The patient tolerated the procedure  well.                         The quality of the bowel preparation was poor. Findings:      Enteroscope was used      The perianal and digital rectal examinations were normal. Pertinent       negatives include normal sphincter tone and no palpable rectal lesions.      Multiple diverticula were found in the entire colon. There was narrowing       of the sigmoid colon in association with the diverticular opening.       Peri-diverticular erythema was seen. No evidence of sigmoid mass or       tumor in rest of the colon      Two sessile polyps were found in the transverse colon. The polyps were 6       to 10 mm in size. These polyps were removed with a cold snare. Resection       and retrieval were complete. To prevent bleeding after the polypectomy,       two hemostatic clips were successfully placed (MR conditional). There       was no bleeding during, or at the end, of the procedure.      Non-bleeding external hemorrhoids were found during retroflexion. The       hemorrhoids were large. Impression:            - Preparation of the colon was poor.                        - Severe diverticulosis in the entire examined colon.  There was narrowing of the colon in association with                         the diverticular opening. Peri-diverticular erythema                         was seen.                        - Two 6 to 10 mm polyps in the transverse colon,                         removed with a cold snare. Resected and retrieved.                         Clips (MR conditional) were placed.                        - Non-bleeding external hemorrhoids. Recommendation:        - Discharge patient to home (with escort).                        - Resume previous diet today.                        - Continue present medications.                        - Await pathology results. Procedure Code(s):     --- Professional ---                        [phone], Colonoscopy, flexible; with  removal of                         tumor(s), polyp(s), or other lesion(s) by snare                         technique Diagnosis Code(s):     --- Professional ---                        K64.4, Residual hemorrhoidal skin tags                        K63.5, Polyp of colon                        K57.30, Diverticulosis of large intestine without                         perforation or abscess without bleeding                        R93.3, Abnormal findings on diagnostic imaging of                         other parts of digestive tract CPT copyright 2019 American [employer] Association. All rights reserved. The codes documented in this report are preliminary and upon coder review may  be revised to meet current compliance requirements. Dr. Ulyess Mort Lin Landsman MD, MD 10/01/2019 10:58:56 AM  This report has been signed electronically. Number of Addenda: 0 Note Initiated On: 10/01/2019 9:35 AM Scope Withdrawal Time: 0 hours 30 minutes 53 seconds  Total Procedure Duration: 1 hour 12 minutes 43 seconds  Estimated Blood Loss:  Estimated blood loss: none.      [facility]

## 2019-10-01 NOTE — H&P (Signed)
Cephas Darby, MD [address]  [place], [place] [address]  Main: [phone]  Fax: [phone] Pager: [phone]  Primary Care Physician:  Danelle Berry, NP Primary Gastroenterologist:  Dr. Cephas Darby  Pre-Procedure History & Physical: HPI:  Jeanette Allen is a 64 y.o. female is here for an colonoscopy.   Past Medical History:  Diagnosis Date  . Arthritis   . Hypertension     Past Surgical History:  Procedure Laterality Date  . APPENDECTOMY    . ESOPHAGOGASTRODUODENOSCOPY (EGD) WITH PROPOFOL N/A 09/12/2019   Procedure: ESOPHAGOGASTRODUODENOSCOPY (EGD) WITH PROPOFOL;  Surgeon: Lin Landsman, MD;  Location: [facility];  Service: Gastroenterology;  Laterality: N/A;    Prior to Admission medications   Medication Sig Start Date End Date Taking? Authorizing Provider  ergocalciferol (VITAMIN D2) 1.25 MG (50000 UT) capsule Take by mouth. 07/09/19  Yes [provider]  FLUoxetine (PROZAC) 20 MG capsule Take by mouth. 08/14/19  Yes [provider]  losartan (COZAAR) 50 MG tablet Take by mouth. 05/18/19  Yes [provider]  omeprazole (PRILOSEC) 40 MG capsule Take 1 capsule (40 mg total) by mouth 2 (two) times daily before a meal. 09/12/19  Yes Vora Clover, Tally Due, MD  ondansetron (ZOFRAN) 8 MG tablet One pill every 8 hours as needed for nausea/vomitting. 09/04/19  Yes Cammie Sickle, MD  ondansetron (ZOFRAN-ODT) 4 MG disintegrating tablet Take 4 mg by mouth every 8 (eight) hours as needed for nausea or vomiting.   Yes [provider]  Pitavastatin Calcium (LIVALO) 4 MG TABS Take 4 mg by mouth daily.   Yes [provider]  prochlorperazine (COMPAZINE) 10 MG tablet Take 1 tablet (10 mg total) by mouth every 6 (six) hours as needed for nausea or vomiting. 09/04/19  Yes Cammie Sickle, MD  dicyclomine (BENTYL) 10 MG/5ML solution Take by mouth 4 (four) times daily -  before meals and at bedtime. Patient  not taking: Reported on 09/25/2019    [provider]    Allergies as of 09/18/2019  . (No Known Allergies)    Family History  Problem Relation Age of Onset  . Diabetes Maternal Grandmother   . Hypertension Maternal Grandmother   . Stroke Maternal Grandmother     Social History   Socioeconomic History  . Marital status: Married    Spouse name: Not on file  . Number of children: Not on file  . Years of education: Not on file  . Highest education level: Not on file  Occupational History  . Not on file  Tobacco Use  . Smoking status: Current Every Day Smoker  . Smokeless tobacco: Never Used  Vaping Use  . Vaping Use: Never used  Substance and Sexual Activity  . Alcohol use: Not Currently  . Drug use: Never  . Sexual activity: Yes  Other Topics Concern  . Not on file  Social History Narrative   Lives with husband; near [place]. 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 Difficulty of Paying Living Expenses: Not on file  Food Insecurity:   . Worried About Charity fundraiser in the Last Year: Not on file  . Ran Out of Food in the Last Year: Not on file  Transportation Needs:   . Lack of Transportation (Medical): Not on file  . Lack of Transportation (  Non-Medical): Not on file  Physical Activity:   . Days of Exercise per Week: Not on file  . Minutes of Exercise per Session: Not on file  Stress:   . Feeling of Stress : Not on file  Social Connections:   . Frequency of Communication with Friends and Family: Not on file  . Frequency of Social Gatherings with Friends and Family: Not on file  . Attends Religious Services: Not on file  . Active Member of Clubs or Organizations: Not on file  . Attends Archivist Meetings: Not on file  . Marital Status: Not on file  Intimate Partner Violence:   . Fear of Current or  Ex-Partner: Not on file  . Emotionally Abused: Not on file  . Physically Abused: Not on file  . Sexually Abused: Not on file    Review of Systems: See HPI, otherwise negative ROS  Physical Exam: BP (!) 161/78   Pulse 81   Temp (!) 97.1 F (36.2 C) (Temporal)   Resp 17   Ht 5\' 4"  (1.626 m)   Wt 69.9 kg   SpO2 99%   BMI 26.43 kg/m  General:   Alert,  pleasant and cooperative in NAD Head:  Normocephalic and atraumatic. Neck:  Supple; no masses or thyromegaly. Lungs:  Clear throughout to auscultation.    Heart:  Regular rate and rhythm. Abdomen:  Soft, nontender and nondistended. Normal bowel sounds, without guarding, and without rebound.   Neurologic:  Alert and  oriented x4;  grossly normal neurologically.  Impression/Plan: Jeanette Allen is here for an colonoscopy to be performed for sigmoid mass on PET CT  Risks, benefits, limitations, and alternatives regarding  colonoscopy have been reviewed with the patient.  Questions have been answered.  All parties agreeable.   Sherri Sear, MD  10/01/2019, 9:26 AM

## 2019-10-01 NOTE — Transfer of Care (Signed)
Immediate Anesthesia Transfer of Care Note  Patient: Jeanette Allen  Procedure(s) Performed: COLONOSCOPY WITH PROPOFOL (N/A )  Patient Location: PACU and Endoscopy Unit  Anesthesia Type:General  Level of Consciousness: drowsy  Airway & Oxygen Therapy: Patient Spontanous Breathing  Post-op Assessment: Report given to RN  Post vital signs: stable  Last Vitals:  Vitals Value Taken Time  BP    Temp    Pulse    Resp    SpO2      Last Pain:  Vitals:   10/01/19 0912  TempSrc: Temporal  PainSc: 0-No pain         Complications: No complications documented.

## 2019-10-01 NOTE — Anesthesia Postprocedure Evaluation (Signed)
Anesthesia Post Note  Patient: Jeanette Allen  Procedure(s) Performed: COLONOSCOPY WITH PROPOFOL (N/A )  Patient location during evaluation: Endoscopy Anesthesia Type: General Level of consciousness: awake and alert Pain management: pain level controlled Vital Signs Assessment: post-procedure vital signs reviewed and stable Respiratory status: spontaneous breathing and respiratory function stable Cardiovascular status: stable Anesthetic complications: no   No complications documented.   Last Vitals:  Vitals:   10/01/19 0912 10/01/19 1059  BP: (!) 161/78 136/82  Pulse: 81   Resp: 17   Temp: (!) 36.2 C (!) 36.4 C  SpO2: 99%     Last Pain:  Vitals:   10/01/19 1059  TempSrc: Temporal  PainSc:                  [name] K

## 2019-10-02 ENCOUNTER — Encounter: Payer: Self-pay | Admitting: Gastroenterology

## 2019-10-02 LAB — SURGICAL PATHOLOGY

## 2019-10-03 ENCOUNTER — Encounter: Payer: Self-pay | Admitting: Gastroenterology

## 2019-10-05 ENCOUNTER — Other Ambulatory Visit: Payer: Self-pay

## 2019-10-05 ENCOUNTER — Inpatient Hospital Stay (HOSPITAL_BASED_OUTPATIENT_CLINIC_OR_DEPARTMENT_OTHER): Payer: Medicare Other | Admitting: Internal Medicine

## 2019-10-05 ENCOUNTER — Inpatient Hospital Stay: Payer: Medicare Other | Attending: Internal Medicine

## 2019-10-05 ENCOUNTER — Inpatient Hospital Stay: Payer: Medicare Other

## 2019-10-05 VITALS — BP 174/94 | HR 97 | Resp 16

## 2019-10-05 DIAGNOSIS — Z23 Encounter for immunization: Secondary | ICD-10-CM | POA: Insufficient documentation

## 2019-10-05 DIAGNOSIS — C541 Malignant neoplasm of endometrium: Secondary | ICD-10-CM

## 2019-10-05 DIAGNOSIS — R180 Malignant ascites: Secondary | ICD-10-CM | POA: Diagnosis not present

## 2019-10-05 DIAGNOSIS — Z7189 Other specified counseling: Secondary | ICD-10-CM

## 2019-10-05 DIAGNOSIS — M25561 Pain in right knee: Secondary | ICD-10-CM | POA: Insufficient documentation

## 2019-10-05 DIAGNOSIS — C579 Malignant neoplasm of female genital organ, unspecified: Secondary | ICD-10-CM

## 2019-10-05 DIAGNOSIS — Z5111 Encounter for antineoplastic chemotherapy: Secondary | ICD-10-CM | POA: Insufficient documentation

## 2019-10-05 DIAGNOSIS — R5381 Other malaise: Secondary | ICD-10-CM | POA: Diagnosis not present

## 2019-10-05 DIAGNOSIS — C786 Secondary malignant neoplasm of retroperitoneum and peritoneum: Secondary | ICD-10-CM | POA: Diagnosis not present

## 2019-10-05 DIAGNOSIS — R5383 Other fatigue: Secondary | ICD-10-CM | POA: Insufficient documentation

## 2019-10-05 DIAGNOSIS — M1990 Unspecified osteoarthritis, unspecified site: Secondary | ICD-10-CM | POA: Insufficient documentation

## 2019-10-05 DIAGNOSIS — R112 Nausea with vomiting, unspecified: Secondary | ICD-10-CM | POA: Insufficient documentation

## 2019-10-05 DIAGNOSIS — Z79899 Other long term (current) drug therapy: Secondary | ICD-10-CM | POA: Diagnosis not present

## 2019-10-05 DIAGNOSIS — R110 Nausea: Secondary | ICD-10-CM | POA: Insufficient documentation

## 2019-10-05 DIAGNOSIS — F17210 Nicotine dependence, cigarettes, uncomplicated: Secondary | ICD-10-CM | POA: Insufficient documentation

## 2019-10-05 DIAGNOSIS — I10 Essential (primary) hypertension: Secondary | ICD-10-CM | POA: Insufficient documentation

## 2019-10-05 DIAGNOSIS — F419 Anxiety disorder, unspecified: Secondary | ICD-10-CM | POA: Diagnosis not present

## 2019-10-05 LAB — CBC WITH DIFFERENTIAL/PLATELET
Abs Immature Granulocytes: 0.02 10*3/uL (ref 0.00–0.07)
Basophils Absolute: 0 10*3/uL (ref 0.0–0.1)
Basophils Relative: 1 %
Eosinophils Absolute: 0.1 10*3/uL (ref 0.0–0.5)
Eosinophils Relative: 2 %
HCT: 39 % (ref 36.0–46.0)
Hemoglobin: 13.2 g/dL (ref 12.0–15.0)
Immature Granulocytes: 0 %
Lymphocytes Relative: 22 %
Lymphs Abs: 1.1 10*3/uL (ref 0.7–4.0)
MCH: 31.4 pg (ref 26.0–34.0)
MCHC: 33.8 g/dL (ref 30.0–36.0)
MCV: 92.9 fL (ref 80.0–100.0)
Monocytes Absolute: 0.5 10*3/uL (ref 0.1–1.0)
Monocytes Relative: 10 %
Neutro Abs: 3.1 10*3/uL (ref 1.7–7.7)
Neutrophils Relative %: 65 %
Platelets: 190 10*3/uL (ref 150–400)
RBC: 4.2 MIL/uL (ref 3.87–5.11)
RDW: 13.6 % (ref 11.5–15.5)
WBC: 4.8 10*3/uL (ref 4.0–10.5)
nRBC: 0 % (ref 0.0–0.2)

## 2019-10-05 LAB — COMPREHENSIVE METABOLIC PANEL
ALT: 30 U/L (ref 0–44)
AST: 37 U/L (ref 15–41)
Albumin: 4 g/dL (ref 3.5–5.0)
Alkaline Phosphatase: 72 U/L (ref 38–126)
Anion gap: 9 (ref 5–15)
BUN: 13 mg/dL (ref 8–23)
CO2: 27 mmol/L (ref 22–32)
Calcium: 9.9 mg/dL (ref 8.9–10.3)
Chloride: 104 mmol/L (ref 98–111)
Creatinine, Ser: 0.86 mg/dL (ref 0.44–1.00)
GFR calc Af Amer: >60 mL/min (ref >60)
GFR calc non Af Amer: >60 mL/min (ref >60)
Glucose, Bld: 108 mg/dL — ABNORMAL HIGH (ref 70–99)
Potassium: 4.3 mmol/L (ref 3.5–5.1)
Sodium: 140 mmol/L (ref 135–145)
Total Bilirubin: 0.5 mg/dL (ref 0.3–1.2)
Total Protein: 7.6 g/dL (ref 6.5–8.1)

## 2019-10-05 MED ORDER — SODIUM CHLORIDE 0.9 % IV SOLN
175.0000 mg/m2 | Freq: Once | INTRAVENOUS | Status: AC
  Administered 2019-10-05: 318 mg via INTRAVENOUS
  Filled 2019-10-05: qty 53

## 2019-10-05 MED ORDER — LORAZEPAM 0.5 MG PO TABS: ORAL_TABLET | ORAL | 0 refills | Status: DC

## 2019-10-05 MED ORDER — SODIUM CHLORIDE 0.9 % IV SOLN
540.0000 mg | Freq: Once | INTRAVENOUS | Status: AC
  Administered 2019-10-05: 540 mg via INTRAVENOUS
  Filled 2019-10-05: qty 54

## 2019-10-05 MED ORDER — SODIUM CHLORIDE 0.9 % IV SOLN
Freq: Once | INTRAVENOUS | Status: AC
  Filled 2019-10-05: qty 250

## 2019-10-05 MED ORDER — FAMOTIDINE IN NACL 20-0.9 MG/50ML-% IV SOLN
20.0000 mg | Freq: Once | INTRAVENOUS | Status: AC
  Administered 2019-10-05: 20 mg via INTRAVENOUS
  Filled 2019-10-05: qty 50

## 2019-10-05 MED ORDER — SODIUM CHLORIDE 0.9 % IV SOLN
10.0000 mg | Freq: Once | INTRAVENOUS | Status: AC
  Administered 2019-10-05: 10 mg via INTRAVENOUS
  Filled 2019-10-05: qty 10

## 2019-10-05 MED ORDER — ONDANSETRON HCL 8 MG PO TABS: ORAL_TABLET | ORAL | 1 refills | Status: DC

## 2019-10-05 MED ORDER — PALONOSETRON HCL INJECTION 0.25 MG/5ML
0.2500 mg | Freq: Once | INTRAVENOUS | Status: AC
  Administered 2019-10-05: 0.25 mg via INTRAVENOUS
  Filled 2019-10-05: qty 5

## 2019-10-05 MED ORDER — SODIUM CHLORIDE 0.9 % IV SOLN
150.0000 mg | Freq: Once | INTRAVENOUS | Status: AC
  Administered 2019-10-05: 150 mg via INTRAVENOUS
  Filled 2019-10-05: qty 150

## 2019-10-05 MED ORDER — LORAZEPAM 0.5 MG PO TABS
0.5000 mg | ORAL_TABLET | Freq: Once | ORAL | Status: AC
  Administered 2019-10-05: 0.5 mg via ORAL
  Filled 2019-10-05: qty 1

## 2019-10-05 MED ORDER — DIPHENHYDRAMINE HCL 50 MG/ML IJ SOLN
50.0000 mg | Freq: Once | INTRAMUSCULAR | Status: AC
  Administered 2019-10-05: 50 mg via INTRAVENOUS
  Filled 2019-10-05: qty 1

## 2019-10-05 MED ORDER — PROCHLORPERAZINE MALEATE 10 MG PO TABS: 10.0000 mg | ORAL_TABLET | Freq: Four times a day (QID) | ORAL | 1 refills | Status: DC | PRN

## 2019-10-05 NOTE — Assessment & Plan Note (Deleted)
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 SEP 9th 2021- PET scan-shows peritoneal carcinomatosis; uterine uptake; sigmoid colonic uptake concerning for malignancy.  No ovarian uptake.  CEA 125 +1300; CEA-2.  Awaiting myRisk HRD; also would recommend adding foundation one testing.  #Recommend proceeding with carbotaxol chemotherapy-given the likely hood of endometrial primary based upon imaging.  Discussed that would recommend approximately 3 cycles of chemo; assess response before offering surgical options.  We will again discuss with GYN oncology.  #Sigmoid mass/uptake noted on PET scan-discussed with Dr.Vanga-plan for colonoscopy in the week of 24th [expect to have count recovery.].  Also discussed with Dr. Olney regarding the above pathology-again suspicious of GYN origin; however will add IHC stains to rule out lower GI primary.   #Again discussed the potential side effects including but not limited to-increasing fatigue, nausea vomiting, diarrhea, hair loss, sores in the mouth, increase risk of infection and also neuropathy.   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  EGD reviewed.  #Ascites-malignant/peritoneal carcinomatosis-discussed that would take about 2 or 3 cycles to improve symptomatically.  For now monitor closely.  #Abdominal discomfort/reflux-agree with PPI.  # DISPOSITION: # carbo-taxol today; NO MVASI # 10 days- labs-cbc/bmp # Follow up in 3 week- MD; labs- CBC/CMP/ca-125- carbo-taxol; Dr.B  I had a long discussion with the patient's daughter Sonia-regarding the patient's diagnosis/treatment plan at length.  In agreement with the plan. 

## 2019-10-05 NOTE — Progress Notes (Signed)
[place] CONSULT NOTE  Patient Care Team: Danelle Berry, NP as PCP - General (Nurse Practitioner) Clent Jacks, RN as Oncology Nurse Navigator  CHIEF COMPLAINTS/PURPOSE OF CONSULTATION: high grade serous cancer   #  Oncology History Overview Note  #August 2021-ascitic fluid cytology-high-grade serous carcinoma of gynecologic origin; CT scan-behind the abdominal wall 4.6 x 2.1 mass ; within the mesentery 6.7 x 4.5 cm left of mid abdomen . BIOPSY of the peritoneal mass; high grade carcinoma [phone] [Dr.Byrnett]; SEP 9th 2021- PET scan-shows peritoneal carcinomatosis; uterine uptake [Dr.Secord; unable to biopsy cervical stenosis] sigmoid colonic uptake concerning for malignancy.  No ovarian uptake.  CEA 125 +1300; CEA-2.    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     # PALLIATIVE CARE EVALUATION:  # PAIN MANAGEMENT:    DIAGNOSIS: Uterine cancer  STAGE:   IV      ;  GOALS: control  CURRENT/MOST RECENT THERAPY : Carbotaxol    Uterine cancer ([place]) (Resolved)  09/04/2019 Initial Diagnosis   Gynecologic cancer [facility])   Endometrial cancer ([place])  09/14/2019 -  Chemotherapy   The patient had dexamethasone (DECADRON) 4 MG tablet, 8 mg, Oral, Daily, 1 of 1 cycle, Start date: --, End date: -- palonosetron (ALOXI) injection 0.25 mg, 0.25 mg, Intravenous,  Once, 2 of 6 cycles Administration: 0.25 mg (09/14/2019) CARBOplatin (PARAPLATIN) 580 mg in sodium chloride 0.9 % 250 mL chemo infusion, 580 mg (100 % of original dose 577.8 mg), Intravenous,  Once, 2 of 6 cycles Dose modification:   (original dose 577.8 mg, Cycle 1) Administration: 580 mg (09/14/2019) fosaprepitant (EMEND) 150 mg in sodium chloride 0.9 % 145 mL IVPB, 150 mg, Intravenous,  Once, 2 of 6  cycles Administration: 150 mg (09/14/2019) PACLitaxel (TAXOL) 318 mg in sodium chloride 0.9 % 500 mL chemo infusion (> 80mg /m2), 175 mg/m2 = 318 mg, Intravenous,  Once, 2 of 6 cycles Administration: 318 mg (09/14/2019)  for chemotherapy treatment.    10/05/2019 Initial Diagnosis   Endometrial cancer ([facility])      HISTORY OF PRESENTING ILLNESS:  Jeanette Allen 64 y.o.  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  In interim patient underwent colonoscopy given sigmoid uptake on PET scan.  No malignancy noted.  On colonoscopy  Patient was also evaluated by gynecology oncology-unable to biopsy the endometrium lesion noted on PET scan-given cervical stenosis.  Patient status post cycle #1 of chemotherapy-feels improved in terms of abdominal distention and abdominal pain.  However continues to have intermittent pain.   Patient noted to have nausea intermittent vomiting.  Especially morning.  She feels nauseous this morning.  Denies any tingling or numbness.   Review of Systems  Constitutional: Positive for malaise/fatigue. Negative for chills, diaphoresis, fever and weight loss.  HENT: Negative for nosebleeds and sore throat.   Eyes: Negative for double vision.  Respiratory: Negative for cough, hemoptysis, sputum production, shortness of breath and wheezing.   Cardiovascular: Negative for chest pain, palpitations, orthopnea and leg swelling.  Gastrointestinal: Positive for abdominal pain and nausea. Negative for blood in stool, constipation, diarrhea, heartburn, melena and vomiting.  Genitourinary: Negative for dysuria, frequency and urgency.  Musculoskeletal: Negative for back pain and joint pain.  Skin: Negative.  Negative for itching and rash.  Neurological: Negative for dizziness, tingling, focal weakness, weakness and headaches.  Endo/Heme/Allergies: Does not bruise/bleed easily.   Psychiatric/Behavioral: Negative for depression. The patient is not nervous/anxious and does not have insomnia.      MEDICAL HISTORY:  Past Medical History:  Diagnosis Date  . Arthritis   . Hypertension     SURGICAL HISTORY: Past Surgical History:  Procedure Laterality Date  . APPENDECTOMY    . COLONOSCOPY WITH PROPOFOL N/A 10/01/2019   Procedure: COLONOSCOPY WITH PROPOFOL;  Surgeon: Lin Landsman, MD;  Location: [facility] ENDOSCOPY;  Service: Gastroenterology;  Laterality: N/A;  . ESOPHAGOGASTRODUODENOSCOPY (EGD) WITH PROPOFOL N/A 09/12/2019   Procedure: ESOPHAGOGASTRODUODENOSCOPY (EGD) WITH PROPOFOL;  Surgeon: Lin Landsman, MD;  Location: [facility] ENDOSCOPY;  Service: Gastroenterology;  Laterality: N/A;    SOCIAL HISTORY: Social History   Socioeconomic History  . Marital status: Married    Spouse name: Not on file  . Number of children: Not on file  . Years of education: Not on file  . Highest education level: Not on file  Occupational History  . Not on file  Tobacco Use  . Smoking status: Current Every Day Smoker  . Smokeless tobacco: Never Used  Vaping Use  . Vaping Use: Never used  Substance and Sexual Activity  . Alcohol use: Not Currently  . Drug use: Never  . Sexual activity: Yes  Other Topics Concern  . Not on file  Social History Narrative   Lives with husband; near [place]. 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 Difficulty of Paying Living Expenses: Not on file  Food Insecurity:   . Worried About Charity fundraiser in the Last Year: Not on file  . Ran Out of Food in the Last Year: Not on file  Transportation Needs:   . Lack of Transportation (Medical): Not on file  . Lack of Transportation (Non-Medical): Not on file  Physical Activity:   . Days of Exercise per Week: Not on file  . Minutes of Exercise per  Session: Not on file  Stress:   . Feeling of Stress : Not on file  Social Connections:   . Frequency of Communication with Friends and Family: Not on file  . Frequency of Social Gatherings with Friends and Family: Not on file  . Attends Religious Services: Not on file  . Active Member of Clubs or Organizations: Not on file  . Attends Archivist Meetings: Not on file  . Marital Status: Not on file  Intimate Partner Violence:   . Fear of Current or Ex-Partner: Not on file  . Emotionally Abused: Not on file  . Physically Abused: Not on file  . Sexually Abused: Not on file    FAMILY HISTORY: Family History  Problem Relation Age of Onset  . Diabetes Maternal Grandmother   . Hypertension Maternal Grandmother   . Stroke Maternal Grandmother     ALLERGIES:  has No Known Allergies.  MEDICATIONS:  Current Outpatient Medications  Medication Sig Dispense Refill  . ergocalciferol (VITAMIN D2) 1.25 MG (50000 UT) capsule Take by mouth.    Marland Kitchen FLUoxetine (PROZAC) 20 MG capsule Take by mouth.    . losartan (COZAAR) 50 MG tablet Take by mouth.    Marland Kitchen omeprazole (PRILOSEC) 40 MG capsule Take 1 capsule (40 mg total) by mouth 2 (two) times daily before a meal. 30 capsule 3  . ondansetron (ZOFRAN) 8 MG tablet One pill every  8 hours as needed for nausea/vomitting. 60 tablet 1  . ondansetron (ZOFRAN-ODT) 4 MG disintegrating tablet Take 4 mg by mouth every 8 (eight) hours as needed for nausea or vomiting.    . Pitavastatin Calcium (LIVALO) 4 MG TABS Take 4 mg by mouth daily.    . prochlorperazine (COMPAZINE) 10 MG tablet Take 1 tablet (10 mg total) by mouth every 6 (six) hours as needed for nausea or vomiting. 40 tablet 1  . LORazepam (ATIVAN) 0.5 MG tablet Take 1-2 pill at night for anxiety/nausea. 60 tablet 0   No current facility-administered medications for this visit.   Facility-Administered Medications Ordered in Other Visits  Medication Dose Route Frequency Provider Last Rate Last  Admin  . 0.9 %  sodium chloride infusion   Intravenous Once Charlaine Dalton R, MD      . CARBOplatin (PARAPLATIN) 540 mg in sodium chloride 0.9 % 250 mL chemo infusion  540 mg Intravenous Once Charlaine Dalton R, MD      . dexamethasone (DECADRON) 10 mg in sodium chloride 0.9 % 50 mL IVPB  10 mg Intravenous Once Charlaine Dalton R, MD      . diphenhydrAMINE (BENADRYL) injection 50 mg  50 mg Intravenous Once Charlaine Dalton R, MD      . famotidine (PEPCID) IVPB 20 mg premix  20 mg Intravenous Once Charlaine Dalton R, MD      . fosaprepitant (EMEND) 150 mg in sodium chloride 0.9 % 145 mL IVPB  150 mg Intravenous Once Charlaine Dalton R, MD      . LORazepam (ATIVAN) tablet 0.5 mg  0.5 mg Oral Once Charlaine Dalton R, MD      . PACLitaxel (TAXOL) 318 mg in sodium chloride 0.9 % 500 mL chemo infusion (> 80mg /m2)  175 mg/m2 (Treatment Plan Recorded) Intravenous Once Charlaine Dalton R, MD      . palonosetron (ALOXI) injection 0.25 mg  0.25 mg Intravenous Once Cammie Sickle, MD          .  PHYSICAL EXAMINATION: ECOG PERFORMANCE STATUS: 1 - Symptomatic but completely ambulatory  Vitals:   10/05/19 0833  BP: (!) 166/86  Pulse: 82  Resp: 16  Temp: (!) 96.4 F (35.8 C)  SpO2: 100%   Filed Weights   10/05/19 0833  Weight: 157 lb (71.2 kg)    Physical Exam Constitutional:      Comments: Ambulating independently.  Accompanied by husband.  HENT:     Head: Normocephalic and atraumatic.     Mouth/Throat:     Pharynx: No oropharyngeal exudate.  Eyes:     Pupils: Pupils are equal, round, and reactive to light.  Cardiovascular:     Rate and Rhythm: Normal rate and regular rhythm.  Pulmonary:     Effort: Pulmonary effort is normal. No respiratory distress.     Breath sounds: Normal breath sounds. No wheezing.  Abdominal:     General: Bowel sounds are normal. There is distension.     Palpations: Abdomen is soft. There is no mass.     Tenderness: There is  no abdominal tenderness. There is no guarding or rebound.  Musculoskeletal:        General: No tenderness. Normal range of motion.     Cervical back: Normal range of motion and neck supple.  Skin:    General: Skin is warm.  Neurological:     Mental Status: She is alert and oriented to person, place, and time.  Psychiatric:  Mood and Affect: Affect normal.      LABORATORY DATA:  I have reviewed the data as listed Lab Results  Component Value Date   WBC 4.8 10/05/2019   HGB 13.2 10/05/2019   HCT 39.0 10/05/2019   MCV 92.9 10/05/2019   PLT 190 10/05/2019   Recent Labs    09/11/19 0841 09/11/19 0841 09/14/19 0813 09/28/19 1006 10/05/19 0807  NA 136   < > 139 138 140  K 3.9   < > 4.0 4.0 4.3  CL 105   < > 104 104 104  CO2 23   < > 24 26 27   GLUCOSE 117*   < > 104* 106* 108*  BUN 18   < > 12 14 13   CREATININE 0.85   < > 0.93 0.73 0.86  CALCIUM 8.9   < > 9.2 9.0 9.9  GFRNONAA >60   < > >60 >60 >60  GFRAA >60   < > >60 >60 >60  PROT 7.1  --  7.5  --  7.6  ALBUMIN 3.4*  --  3.5  --  4.0  AST 27  --  30  --  37  ALT 21  --  21  --  30  ALKPHOS 74  --  71  --  72  BILITOT 0.6  --  0.5  --  0.5   < > = values in this interval not displayed.    RADIOGRAPHIC STUDIES: I have personally reviewed the radiological images as listed and agreed with the findings in the report. NM PET Image Initial (PI) Skull Base To Thigh  Result Date: 09/13/2019 CLINICAL DATA:  Initial treatment strategy for peritoneal carcinomatosis of unknown primary. EXAM: NUCLEAR MEDICINE PET SKULL BASE TO THIGH TECHNIQUE: 9.06 mCi F-18 FDG was injected intravenously. Full-ring PET imaging was performed from the skull base to thigh after the radiotracer. CT data was obtained and used for attenuation correction and anatomic localization. Fasting blood glucose: 102 mg/dl COMPARISON:  Outside [facility]. This is dated 08/22/2019 FINDINGS: Mediastinal blood pool activity: SUV max 1.52 Liver  activity: SUV max NA NECK: No hypermetabolic lymph nodes in the neck. Incidental CT findings: none CHEST: No breast masses, supraclavicular or axillary adenopathy. The thyroid gland is unremarkable. No enlarged or hypermetabolic mediastinal or hilar lymph nodes. Small bilateral pleural effusions are noted, left greater than right. There is a 5 mm pulmonary nodule in the left upper lobe on image 78/3 which shows mild hypermetabolism with SUV max of 2.53. This could be a metastatic focus. No other definite pulmonary lesions. Incidental CT findings: Aortic and coronary artery calcifications are noted ABDOMEN/PELVIS: As demonstrated on the outside CT scan there is diffuse peritoneal carcinomatosis with extensive peritoneal implants, omental caking and peritoneal surface disease. Large area of omental caking in the left upper quadrant adjacent to the transverse colon has an SUV max of 9.6. 2.1 cm peritoneal implant just inferior to the antral region of the stomach has an SUV max of 12.57. Periportal adenopathy has an SUV max of 9.32. I do not see any intrahepatic metastatic disease. Peritoneal surface disease is noted. No pancreatic mass is identified. The uterus is markedly abnormal with findings suspicious for endometrial cancer. Diffuse irregular hypermetabolism noted with SUV max of 10.64. No ovarian lesions are identified. Area of moderate wall thickening involving the sigmoid colon demonstrates marked hypermetabolism with SUV max of 13.58. This could reflect the patient's primary tumor. Incidental CT findings: Moderate volume abdominal/pelvic ascites. SKELETON:  No bone lesions are identified. Incidental CT findings: none IMPRESSION: 1. Diffuse and extensive peritoneal carcinomatosis as detailed above. The uterus and the sigmoid colon are abnormal and the primary neoplasm could be the sigmoid colon or possibly endometrial carcinoma. Given their close proximity it is also possible that this is sigmoid colon cancer  invading the uterus or less likely vice versa. Recommend omental biopsy or sigmoidoscopy. 2. Single left upper lobe pulmonary nodule is mildly hypermetabolic and could reflect metastatic disease. Electronically Signed   By: Marijo Sanes M.D.   On: 09/13/2019 12:16   US Abdomen Limited  Result Date: 09/07/2019 CLINICAL DATA:  Series carcinoma of gynecologic origin with ascites. Status post paracentesis on 08/27/2019 with removal 1.3 L of fluid at that time. EXAM: LIMITED ABDOMEN ULTRASOUND FOR ASCITES TECHNIQUE: Limited ultrasound survey for ascites was performed in all four abdominal quadrants. COMPARISON:  08/27/2019 FINDINGS: A large volume of ascites was not present by ultrasound with most of the fluid adjacent to the liver. Therapeutic paracentesis was deferred today. The patient is scheduled for peritoneal mass biopsy later today. IMPRESSION: Relatively small overall volume of ascites by ultrasound. Therapeutic paracentesis was deferred today. Electronically Signed   By: Aletta Edouard M.D.   On: 09/07/2019 11:12   CT Biopsy  Result Date: 09/07/2019 CLINICAL DATA:  Pelvic mass, ascites and peritoneal masses. Cytologic analysis of ascites previously demonstrated malignant cells consistent with serous carcinoma. Additional soft tissue biopsy of a peritoneal mass has been requested for further molecular studies to characterize the carcinoma. EXAM: CT GUIDED CORE BIOPSY OF PERITONEAL MASS ANESTHESIA/SEDATION: 2.0 mg IV Versed; 100 mcg IV Fentanyl Total Moderate Sedation Time:  15 minutes. The patient's level of consciousness and physiologic status were continuously monitored during the procedure by Radiology nursing. PROCEDURE: The procedure risks, benefits, and alternatives were explained to the patient. Questions regarding the procedure were encouraged and answered. The patient understands and consents to the procedure. A time-out was performed prior to initiating the procedure. CT was performed through  the abdomen. The left upper lateral abdominal wall was prepped with chlorhexidine in a sterile fashion, and a sterile drape was applied covering the operative field. A sterile gown and sterile gloves were used for the procedure. Local anesthesia was provided with 1% Lidocaine. Under CT guidance, a 17 gauge trocar needle was advanced to the level of a left upper abdominal peritoneal mass. After confirming needle tip position, 4 separate coaxial 18 gauge core biopsy samples were obtained. Core biopsy samples were submitted in formalin. Gel-Foam pledgets were advanced through the trocar needle before removal. Additional CT was performed. COMPLICATIONS: None FINDINGS: Peritoneal mass located in the lateral aspect of the left upper quadrant lateral to the splenic flexure was localized. This mass measures roughly 2.9 x 5.5 cm in transverse dimensions. Biopsy yielded solid tissue. IMPRESSION: CT-guided core biopsy performed of peritoneal mass in the left upper quadrant. Electronically Signed   By: Aletta Edouard M.D.   On: 09/07/2019 14:43    ASSESSMENT & PLAN:   Endometrial cancer ([place]) # STAGE IV-High-grade serous carcinoma of gynecologic origin based on cytology/biopsy of the peritoneal mass; GI immunohistochemistry negative. SEP 9th 2021- PET scan-shows peritoneal carcinomatosis; uterine uptake [unable to biopsy because of cervical stenosis]; sigmoid colonic [see below] uptake concerning for malignancy. CEA 125 +1300; CEA-2.  Currently on carbotaxol chemotherapy.  NGS pending-tolerating chemotherapy fairly well except for mild to moderate difficulties-nausea vomiting [see below]  #Proceed with cycle #2 of carbotaxol. Labs today reviewed;  acceptable for  treatment today.  We will plan CT scan after 3 cycles [Nov 2nd] reevaluate with gynecology oncology for potential resection.  #Sigmoid mass/uptake noted on PET scan-9/27-reviewed colonoscopy negative for any obvious malignancy in the sigmoid colon.    #Ascites-malignant/peritoneal carcinomatosis-clinically symptoms improved however exam no significant change noted.  Overall stable we will continue monitor closely.  # Nausea/Vomitting-? Anxiety related- recommend ativan qhs; new script.  Added Ativan premedication.  # DISPOSITION: # carbo-taxol today # 10 days- labs-cbc/bmp # Follow up in 3 week- MD; labs- CBC/CMP/ca-125- carbo-taxol; Dr.B    All questions were answered. The patient knows to call the clinic with any problems, questions or concerns.   Cammie Sickle, MD 10/05/2019 9:17 AM

## 2019-10-05 NOTE — Progress Notes (Signed)
Final BP 174/94. Patient denies any s/s at this time. Dr. Rogue Bussing and team made aware. Educated patient on s/s as to when to seek emergency care and to monitor BP at home. Patient verbalizes understanding and denies any further questions or concerns.

## 2019-10-05 NOTE — Assessment & Plan Note (Addendum)
#   STAGE IV-High-grade serous carcinoma of gynecologic origin based on cytology/biopsy of the peritoneal mass; GI immunohistochemistry negative. SEP 9th 2021- PET scan-shows peritoneal carcinomatosis; uterine uptake [unable to biopsy because of cervical stenosis]; sigmoid colonic [see below] uptake concerning for malignancy. CEA 125 +1300; CEA-2.  Currently on carbotaxol chemotherapy.  NGS pending-tolerating chemotherapy fairly well except for mild to moderate difficulties-nausea vomiting [see below]  #Proceed with cycle #2 of carbotaxol. Labs today reviewed;  acceptable for treatment today.  We will plan CT scan after 3 cycles [Nov 2nd] reevaluate with gynecology oncology for potential resection.  #Sigmoid mass/uptake noted on PET scan-9/27-reviewed colonoscopy negative for any obvious malignancy in the sigmoid colon.   #Ascites-malignant/peritoneal carcinomatosis-clinically symptoms improved however exam no significant change noted.  Overall stable we will continue monitor closely.  # Nausea/Vomitting-? Anxiety related- recommend ativan qhs; new script.  Added Ativan premedication.  # DISPOSITION: # carbo-taxol today # 10 days- labs-cbc/bmp # Follow up in 3 week- MD; labs- CBC/CMP/ca-125- carbo-taxol; Dr.B

## 2019-10-05 NOTE — Progress Notes (Signed)
Pt states head is sensitive when rubbing it a certain way. Wants to know if that is normal. Having nausea every morning.

## 2019-10-05 NOTE — Addendum Note (Signed)
Addended by: Delice Bison E on: 10/05/2019 01:50 PM   Modules accepted: Orders

## 2019-10-06 LAB — CA 125: Cancer Antigen (CA) 125: 1196 U/mL — ABNORMAL HIGH (ref 0.0–38.1)

## 2019-10-08 ENCOUNTER — Telehealth: Payer: Self-pay | Admitting: Internal Medicine

## 2019-10-08 NOTE — Telephone Encounter (Signed)
On 10/04-spoke with patient regarding the results of the CA- 125; improving.  Plan is to repeat a CT scan after 3 cycles/prior to visit with GYN onc. GB.

## 2019-10-09 ENCOUNTER — Encounter: Payer: Self-pay | Admitting: Internal Medicine

## 2019-10-10 ENCOUNTER — Telehealth: Payer: Self-pay | Admitting: *Deleted

## 2019-10-10 ENCOUNTER — Ambulatory Visit: Payer: Medicare Other

## 2019-10-10 DIAGNOSIS — M79605 Pain in left leg: Secondary | ICD-10-CM

## 2019-10-10 NOTE — Telephone Encounter (Signed)
Apts made. - Jeanette Allen, patient will be coming at 2:30 pm tom for doppler results.

## 2019-10-10 NOTE — Telephone Encounter (Signed)
Patient reports bilateral leg/knee pain. Patient describes as a 'cramping, dull ache and worse at bedtime." she denies any redness or swelling to the right or left leg. Patient gets better with Tylenol. No history of arthritis in the leg/knee.

## 2019-10-10 NOTE — Telephone Encounter (Signed)
Dr. Rogue Bussing also spoke with patient. MD would like to have a stat bil. Lower extremity doppler. Patient needs to be evaluated in [facility] tomorrow for results

## 2019-10-11 ENCOUNTER — Other Ambulatory Visit: Payer: Self-pay

## 2019-10-11 ENCOUNTER — Ambulatory Visit
Admission: RE | Admit: 2019-10-11 | Discharge: 2019-10-11 | Disposition: A | Discharge status: Home / Self Care | Payer: Medicare Other | Source: Ambulatory Visit | Attending: Internal Medicine | Admitting: Internal Medicine

## 2019-10-11 ENCOUNTER — Telehealth: Payer: Self-pay | Admitting: Licensed Clinical Social Worker

## 2019-10-11 ENCOUNTER — Encounter: Payer: Self-pay | Admitting: Licensed Clinical Social Worker

## 2019-10-11 ENCOUNTER — Inpatient Hospital Stay (HOSPITAL_BASED_OUTPATIENT_CLINIC_OR_DEPARTMENT_OTHER): Payer: Medicare Other | Admitting: Nurse Practitioner

## 2019-10-11 ENCOUNTER — Ambulatory Visit: Payer: Self-pay | Admitting: Licensed Clinical Social Worker

## 2019-10-11 VITALS — BP 141/76 | HR 87 | Temp 97.0°F | Resp 18 | Wt 153.0 lb

## 2019-10-11 DIAGNOSIS — Z1379 Encounter for other screening for genetic and chromosomal anomalies: Secondary | ICD-10-CM

## 2019-10-11 DIAGNOSIS — M79604 Pain in right leg: Secondary | ICD-10-CM | POA: Diagnosis not present

## 2019-10-11 DIAGNOSIS — M79605 Pain in left leg: Secondary | ICD-10-CM | POA: Diagnosis present

## 2019-10-11 DIAGNOSIS — Z5111 Encounter for antineoplastic chemotherapy: Secondary | ICD-10-CM | POA: Diagnosis not present

## 2019-10-11 DIAGNOSIS — C541 Malignant neoplasm of endometrium: Secondary | ICD-10-CM

## 2019-10-11 DIAGNOSIS — M25561 Pain in right knee: Secondary | ICD-10-CM

## 2019-10-11 MED ORDER — TRAMADOL HCL 50 MG PO TABS: 50.0000 mg | ORAL_TABLET | Freq: Four times a day (QID) | ORAL | 0 refills | Status: DC | PRN

## 2019-10-11 MED ORDER — PREDNISONE 5 MG PO TABS: 5.0000 mg | ORAL_TABLET | Freq: Every day | ORAL | 0 refills | Status: AC

## 2019-10-11 MED ORDER — TRAMADOL HCL 50 MG PO TABS
50.0000 mg | ORAL_TABLET | Freq: Four times a day (QID) | ORAL | 0 refills | Status: DC | PRN
Start: 2019-10-11 — End: 2020-01-29

## 2019-10-11 NOTE — Telephone Encounter (Signed)
Revealed negative germline genetic testing. HRD testing still pending. This normal result is reassuring and indicates that it is unlikely Jeanette Allen's cancer is due to a hereditary cause.  It is unlikely that there is an increased risk of another cancer due to a mutation in one of these genes.  However, genetic testing is not perfect, and cannot definitively rule out a hereditary cause.  It will be important for her to keep in contact with genetics to learn if any additional testing may be needed in the future.

## 2019-10-11 NOTE — Progress Notes (Addendum)
HPI:  Ms. Lookabaugh was previously seen in the [place] clinic due to a personal history of cancer and concerns regarding a hereditary predisposition to cancer. Please refer to our prior cancer genetics clinic note for more information regarding our discussion, assessment and recommendations, at the time. Ms. Battaglia recent genetic test results were disclosed to her, as were recommendations warranted by these results. These results and recommendations are discussed in more detail below.  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 BIOPSY of the peritoneal mass; high grade carcinoma [phone] [Dr.Byrnett]; SEP 9th 2021- PET scan-shows peritoneal carcinomatosis; uterine uptake [Dr.Secord; unable to biopsy cervical stenosis] sigmoid colonic uptake concerning for malignancy.  No ovarian uptake.  CEA 125 +1300; CEA-2.    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     # PALLIATIVE CARE EVALUATION:  # PAIN MANAGEMENT:    DIAGNOSIS: Uterine cancer  STAGE:   IV      ;  GOALS: control  CURRENT/MOST RECENT THERAPY : Carbotaxol    Uterine cancer ([place]) (Resolved)  09/04/2019 Initial Diagnosis   Gynecologic cancer [facility])   Endometrial cancer ([place])  09/14/2019 -  Chemotherapy   The patient had dexamethasone (DECADRON) 4 MG tablet, 8 mg, Oral, Daily, 1 of 1 cycle, Start date: --, End date: -- palonosetron (ALOXI) injection 0.25 mg, 0.25 mg, Intravenous,  Once, 2 of 6 cycles Administration: 0.25 mg (09/14/2019), 0.25 mg (10/05/2019) CARBOplatin (PARAPLATIN) 580 mg in sodium chloride 0.9 % 250 mL chemo  infusion, 580 mg (100 % of original dose 577.8 mg), Intravenous,  Once, 2 of 6 cycles Dose modification:   (original dose 577.8 mg, Cycle 1) Administration: 580 mg (09/14/2019), 540 mg (10/05/2019) fosaprepitant (EMEND) 150 mg in sodium chloride 0.9 % 145 mL IVPB, 150 mg, Intravenous,  Once, 2 of 6 cycles Administration: 150 mg (09/14/2019), 150 mg (10/05/2019) PACLitaxel (TAXOL) 318 mg in sodium chloride 0.9 % 500 mL chemo infusion (> 15m/m2), 175 mg/m2 = 318 mg, Intravenous,  Once, 2 of 6 cycles Administration: 318 mg (09/14/2019), 318 mg (10/05/2019)  for chemotherapy treatment.    10/05/2019 Initial Diagnosis   Endometrial cancer (HCC)    Genetic Testing   Negative germline genetic testing. No pathogenic variants identified on the [facility]. The report date is 10/01/2019. HRD testing ([employer]) still pending.   The [facility] panel offered by [employer] sequencing and deletion/duplication testing of the following 35 genes: APC, ATM, AXIN2, BARD1, BMPR1A, BRCA1, BRCA2, BRIP1, CHD1, CDK4, CDKN2A, CHEK2, EPCAM (large rearrangement only), HOXB13, GALNT12, MLH1, MSH2, MSH3, MSH6, MUTYH, NBN, NTHL1, PALB2, PMS2, PTEN, RAD51C, RAD51D, RNF43, RPS20, SMAD4, STK11, and TP53. Sequencing was performed for select regions of POLE and POLD1, and large rearrangement analysis was performed for select regions of GREM1.      FAMILY HISTORY:  We obtained a detailed, 4-generation family history.  Significant diagnoses are listed below: Family History  Problem Relation Age of Onset  . Diabetes Maternal Grandmother   . Hypertension Maternal Grandmother   . Stroke Maternal Grandmother     Ms. Jobe has 2 sons, 474and 214 and a daughter, 44 She has 4 maternal half brothers but is not close with them.  Ms. [place] mother passed away in her 77s, no cancer history. Patient had 1 maternal aunt and 2 maternal uncles, no cancers. No known cancers in cousins. Maternal grandmother  passed at 23, she does not have information about maternal grandfather.   Ms. Lansdale does not have information about paternal side of the family.   Ms. Nawabi is unaware of previous family history of genetic testing for hereditary cancer risks. Patient's maternal ancestors are of [place] and [place] descent, and paternal ancestors are of unknown descent. There is no reported Ashkenazi Jewish ancestry. There is no known consanguinity.  GENETIC TEST RESULTS: Genetic testing reported out on 10/01/2019 through the [facility] cancer panel found no pathogenic mutations.   The [facility] gene panel offered by [employer] includes sequencing and deletion/duplication testing of the following 35 genes: APC, ATM, AXIN2, BARD1, BMPR1A, BRCA1, BRCA2, BRIP1, CHD1, CDK4, CDKN2A, CHEK2, EPCAM (large rearrangement only), HOXB13, GALNT12, MLH1, MSH2, MSH3, MSH6, MUTYH, NBN, NTHL1, PALB2, PMS2, PTEN, RAD51C, RAD51D, RNF43, RPS20, SMAD4, STK11, and TP53. Sequencing was performed for select regions of POLE and POLD1, and large rearrangement analysis was performed for select regions of GREM1.   HRD testing ([facility]) was ordered but cancelled due to not enough sample.  The test report has been scanned into EPIC and is located under the Molecular Pathology section of the Results Review tab.  A portion of the result report is included below for reference.       We discussed with Ms. Gettinger that because current genetic testing is not perfect, it is possible there may be a gene mutation in one of these genes that current testing cannot detect, but that chance is small.  We also discussed, that there could be another gene that has not yet been discovered, or that we have not yet tested, that is responsible for the cancer diagnoses in the family. It is also possible there is a hereditary cause for the cancer in the family that Ms. Randleman did not inherit and therefore was not identified in her testing.   Therefore, it is important to remain in touch with cancer genetics in the future so that we can continue to offer Ms. Czajkowski the most up to date genetic testing.    ADDITIONAL GENETIC TESTING: We discussed with Ms. Konen that her genetic testing was fairly extensive.  If there are genes identified to increase cancer risk that can be analyzed in the future, we would be happy to discuss and coordinate this testing at that time.    CANCER SCREENING RECOMMENDATIONS: Ms. Nouri test result is considered negative (normal).  This means that we have not identified a hereditary cause for her  personal history of cancer at this time. Most cancers happen by chance and this negative test suggests that her cancer may fall into this category.    While reassuring, this does not definitively rule out a hereditary predisposition to cancer. It is still possible that there could be genetic mutations that are undetectable by current technology. There could be genetic mutations in genes that have not been tested or identified to increase cancer risk.  Therefore, it is recommended she continue to follow the cancer management and screening guidelines provided by her oncology and primary healthcare provider.   An individual's cancer risk and medical management are not determined by genetic test results alone. Overall cancer risk assessment incorporates additional factors, including personal medical history, family history, and any available genetic information that may result in a personalized plan  for cancer prevention and surveillance.  RECOMMENDATIONS FOR FAMILY MEMBERS:  Relatives in this family might be at some increased risk of developing cancer, over the general population risk, simply due to the family history of cancer.  We recommended female relatives in this family have a yearly mammogram beginning at age 63, or 7 years younger than the earliest onset of cancer, an annual clinical breast exam, and perform monthly  breast self-exams. Female relatives in this family should also have a gynecological exam as recommended by their primary provider.  All family members should be referred for colonoscopy starting at age 30.   FOLLOW-UP: Lastly, we discussed with Ms. Malmberg that cancer genetics is a rapidly advancing field and it is possible that new genetic tests will be appropriate for her and/or her family members in the future. We encouraged her to remain in contact with cancer genetics on an annual basis so we can update her personal and family histories and let her know of advances in cancer genetics that may benefit this family.   Our contact number was provided. Ms. Vieyra questions were answered to her satisfaction, and she knows she is welcome to call us at anytime with additional questions or concerns.   Faith Rogue, MS, [facility] Genetic Counselor [email or web address] Phone: [phone]

## 2019-10-11 NOTE — Progress Notes (Signed)
Pt in for test results.

## 2019-10-11 NOTE — Progress Notes (Signed)
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 BIOPSY of the peritoneal mass; high grade carcinoma [phone] [Dr.Byrnett]; SEP 9th 2021- PET scan-shows peritoneal carcinomatosis; uterine uptake [Dr.Secord; unable to biopsy cervical stenosis] sigmoid colonic uptake concerning for malignancy.  No ovarian uptake.  CEA 125 +1300; CEA-2.    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     # PALLIATIVE CARE EVALUATION:  # PAIN MANAGEMENT:    DIAGNOSIS: Uterine cancer  STAGE:   IV      ;  GOALS: control  CURRENT/MOST RECENT THERAPY : Carbotaxol    Uterine cancer ([place]) (Resolved)  09/04/2019 Initial Diagnosis   Gynecologic cancer [facility])   Endometrial cancer ([place])  09/14/2019 -  Chemotherapy   The patient had dexamethasone (DECADRON) 4 MG tablet, 8 mg, Oral, Daily, 1 of 1 cycle, Start date: --, End date: -- palonosetron (ALOXI) injection 0.25 mg, 0.25 mg, Intravenous,  Once, 2 of 6 cycles Administration: 0.25 mg (09/14/2019), 0.25 mg (10/05/2019) CARBOplatin (PARAPLATIN) 580 mg in sodium chloride 0.9 % 250 mL chemo infusion, 580 mg (100 % of original dose 577.8 mg), Intravenous,  Once, 2 of 6  cycles Dose modification:   (original dose 577.8 mg, Cycle 1) Administration: 580 mg (09/14/2019), 540 mg (10/05/2019) fosaprepitant (EMEND) 150 mg in sodium chloride 0.9 % 145 mL IVPB, 150 mg, Intravenous,  Once, 2 of 6 cycles Administration: 150 mg (09/14/2019), 150 mg (10/05/2019) PACLitaxel (TAXOL) 318 mg in sodium chloride 0.9 % 500 mL chemo infusion (> 36m/m2), 175 mg/m2 = 318 mg, Intravenous,  Once, 2 of 6 cycles Administration: 318 mg (09/14/2019), 318 mg (10/05/2019)  for chemotherapy treatment.    10/05/2019 Initial Diagnosis   Endometrial cancer (HCC)    Genetic Testing   Negative germline genetic testing. [facility] pathogenic [facility] The report date is 10/01/2019. HRD testing ([facility]) still pending.   The [facility] panel offered by [employer] sequencing and deletion/duplication testing of the following 35 genes: APC, ATM, AXIN2, BARD1, BMPR1A, BRCA1, BRCA2, BRIP1, CHD1, CDK4, CDKN2A, CHEK2, EPCAM (large rearrangement only), HOXB13, GALNT12, MLH1, MSH2, MSH3, MSH6, MUTYH, NBN, NTHL1, PALB2, PMS2, PTEN, RAD51C, RAD51D, RNF43, RPS20, SMAD4, STK11, and TP53. Sequencing was performed for select regions of POLE and POLD1, and large rearrangement analysis was performed for select regions of GREM1.      Interval history-patient presents to symptom management clinic for complaints of bilateral knee pain which started a couple of days ago.  Right started before left, right is worse than left.  Says pain radiates from right knee up and down leg, rates his 65 of 10.  Interferes with sleep last night.  Described as cramping, dull ache.  Denies injury redness swelling.  Has been taking Tylenol with some improvement.  She had an ultrasound today and presents for results.  ECOG FS:1 - Symptomatic but completely ambulatory  Review of systems- Review of Systems  Constitutional: Negative for chills, fever, malaise/fatigue and weight loss.   HENT: Negative for hearing loss, nosebleeds, sore throat and tinnitus.   Eyes: Negative for blurred vision and double vision.  Respiratory: Negative for cough, hemoptysis, shortness of breath and wheezing.   Cardiovascular: Negative for chest pain, palpitations and leg swelling.  Gastrointestinal: Negative for abdominal pain, blood in stool, constipation, diarrhea, melena, nausea and vomiting.  Genitourinary: Negative for dysuria and urgency.  Musculoskeletal: Positive for joint pain. Negative for back pain, falls and myalgias.  Skin: Positive for itching (Head). Negative for rash.  Neurological: Negative for dizziness, tingling, sensory change, loss of consciousness, weakness and headaches.  Endo/Heme/Allergies: Negative for environmental allergies. Does not bruise/bleed easily.  Psychiatric/Behavioral: Negative for depression (Some depressed mood after last treatment). The patient is not nervous/anxious and does not have insomnia.      Current treatment-carbo-taxol chemotherapy; cycle 2 on 10/05/2019  No Known Allergies  Past Medical History:  Diagnosis Date  . Arthritis   . Hypertension     Past Surgical History:  Procedure Laterality Date  . APPENDECTOMY    . COLONOSCOPY WITH PROPOFOL N/A 10/01/2019   Procedure: COLONOSCOPY WITH PROPOFOL;  Surgeon: Lin Landsman, MD;  Location[facility] ENDOSCOPY;  Service: Gastroenterology;  Laterality: N/A;  . ESOPHAGOGASTRODUODENOSCOPY (EGD) WITH PROPOFOL N/A 09/12/2019   Procedure: ESOPHAGOGASTRODUODENOSCOPY (EGD) WITH PROPOFOL;  Surgeon: Lin Landsman, MD;  Location: [facility] ENDOSCOPY;  Service: Gastroenterology;  Laterality: N/A;    Social History   Socioeconomic History  . Marital status: Married    Spouse name: Not on file  . Number of children: Not on file  . Years of education: Not on file  . Highest education level: Not on file  Occupational History  . Not on file  Tobacco Use  . Smoking status: Current Every Day Smoker   . Smokeless tobacco: Never Used  Vaping Use  . Vaping Use: Never used  Substance and Sexual Activity  . Alcohol use: Not Currently  . Drug use: Never  . Sexual activity: Yes  Other Topics Concern  . Not on file  Social History Narrative   Lives with husband; near [place]. 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 Difficulty of Paying Living Expenses: Not on file  Food Insecurity:   . Worried About Charity fundraiser in the Last Year: Not on file  . Ran Out of Food in the Last Year: Not on file  Transportation Needs:   . Lack of Transportation (Medical): Not on file  . Lack of Transportation (Non-Medical): Not on file  Physical Activity:   . Days of Exercise per Week: Not on file  . Minutes of Exercise per Session: Not on file  Stress:   . Feeling of Stress : Not on file  Social Connections:   . Frequency of Communication with Friends and Family: Not on file  . Frequency of Social Gatherings with Friends and Family: Not on file  . Attends Religious Services: Not on file  . Active Member of Clubs or Organizations: Not on file  . Attends Archivist Meetings: Not on file  . Marital Status: Not on file  Intimate Partner Violence:   . Fear of Current or Ex-Partner: Not on file  . Emotionally Abused: Not  on file  . Physically Abused: Not on file  . Sexually Abused: Not on file    Family History  Problem Relation Age of Onset  . Diabetes Maternal Grandmother   . Hypertension Maternal Grandmother   . Stroke Maternal Grandmother      Current Outpatient Medications:  .  ergocalciferol (VITAMIN D2) 1.25 MG (50000 UT) capsule, Take by mouth., Disp: , Rfl:  .  FLUoxetine (PROZAC) 20 MG capsule, Take by mouth., Disp: , Rfl:  .  LORazepam (ATIVAN) 0.5 MG tablet, Take 1-2 pill at night for anxiety/nausea., Disp: 60 tablet, Rfl: 0 .   losartan (COZAAR) 50 MG tablet, Take by mouth., Disp: , Rfl:  .  omeprazole (PRILOSEC) 40 MG capsule, Take 1 capsule (40 mg total) by mouth 2 (two) times daily before a meal., Disp: 30 capsule, Rfl: 3 .  ondansetron (ZOFRAN) 8 MG tablet, One pill every 8 hours as needed for nausea/vomitting., Disp: 60 tablet, Rfl: 1 .  prochlorperazine (COMPAZINE) 10 MG tablet, Take 1 tablet (10 mg total) by mouth every 6 (six) hours as needed for nausea or vomiting., Disp: 40 tablet, Rfl: 1 .  ondansetron (ZOFRAN-ODT) 4 MG disintegrating tablet, Take 4 mg by mouth every 8 (eight) hours as needed for nausea or vomiting. (Patient not taking: Reported on 10/11/2019), Disp: , Rfl:  .  Pitavastatin Calcium (LIVALO) 4 MG TABS, Take 4 mg by mouth daily. (Patient not taking: Reported on 10/11/2019), Disp: , Rfl:  .  predniSONE (DELTASONE) 5 MG tablet, Take 1 tablet (5 mg total) by mouth daily with breakfast for 5 days., Disp: 5 tablet, Rfl: 0 .  traMADol (ULTRAM) 50 MG tablet, Take 1 tablet (50 mg total) by mouth every 6 (six) hours as needed for severe pain., Disp: 15 tablet, Rfl: 0  Physical exam:  Vitals:   10/11/19 1436  BP: (!) 141/76  Pulse: 87  Resp: 18  Temp: (!) 97 F (36.1 C)  TempSrc: Tympanic  Weight: 153 lb (69.4 kg)   Physical Exam Constitutional:      General: She is not in acute distress. Musculoskeletal:        General: No swelling, tenderness, deformity or signs of injury.     Right knee: Normal. No swelling, deformity, effusion, erythema, bony tenderness or crepitus. No tenderness.     Instability Tests: Anterior drawer test negative. Posterior drawer test negative.     Left knee: Normal. No swelling, deformity, effusion, erythema, bony tenderness or crepitus. No tenderness.     Right lower leg: Normal. No tenderness. No edema.     Left lower leg: Normal. No tenderness. No edema.     Right ankle: Normal.     Left ankle: Normal.     Right foot: Normal capillary refill. Normal pulse.  Skin:     General: Skin is warm and dry.     Findings: No rash.  Neurological:     Mental Status: She is alert and oriented to person, place, and time.  Psychiatric:        Mood and Affect: Mood normal.        Behavior: Behavior normal.      CMP Latest Ref Rng & Units 10/05/2019  Glucose 70 - 99 mg/dL 108(H)  BUN 8 - 23 mg/dL 13  Creatinine 0.44 - 1.00 mg/dL 0.86  Sodium 135 - 145 mmol/L 140  Potassium 3.5 - 5.1 mmol/L 4.3  Chloride 98 - 111 mmol/L 104  CO2 22 - 32 mmol/L 27  Calcium  8.9 - 10.3 mg/dL 9.9  Total Protein 6.5 - 8.1 g/dL 7.6  Total Bilirubin 0.3 - 1.2 mg/dL 0.5  Alkaline Phos 38 - 126 U/L 72  AST 15 - 41 U/L 37  ALT 0 - 44 U/L 30   CBC Latest Ref Rng & Units 10/05/2019  WBC 4.0 - 10.5 K/uL 4.8  Hemoglobin 12.0 - 15.0 g/dL 13.2  Hematocrit 36 - 46 % 39.0  Platelets 150 - 400 K/uL 190    No images are attached to the encounter.  NM PET Image Initial (PI) Skull Base To Thigh  Result Date: 09/13/2019 CLINICAL DATA:  Initial treatment strategy for peritoneal carcinomatosis of unknown primary. EXAM: NUCLEAR MEDICINE PET SKULL BASE TO THIGH TECHNIQUE: 9.06 mCi F-18 FDG was injected intravenously. Full-ring PET imaging was performed from the skull base to thigh after the radiotracer. CT data was obtained and used for attenuation correction and anatomic localization. Fasting blood glucose: 102 mg/dl COMPARISON:  Outside CT scan from [facility]. This is dated 08/22/2019 FINDINGS: Mediastinal blood pool activity: SUV max 1.52 Liver activity: SUV max NA NECK: No hypermetabolic lymph nodes in the neck. Incidental CT findings: none CHEST: No breast masses, supraclavicular or axillary adenopathy. The thyroid gland is unremarkable. No enlarged or hypermetabolic mediastinal or hilar lymph nodes. Small bilateral pleural effusions are noted, left greater than right. There is a 5 mm pulmonary nodule in the left upper lobe on image 78/3 which shows mild hypermetabolism with SUV max of 2.53. This  could be a metastatic focus. No other definite pulmonary lesions. Incidental CT findings: Aortic and coronary artery calcifications are noted ABDOMEN/PELVIS: As demonstrated on the outside CT scan there is diffuse peritoneal carcinomatosis with extensive peritoneal implants, omental caking and peritoneal surface disease. Large area of omental caking in the left upper quadrant adjacent to the transverse colon has an SUV max of 9.6. 2.1 cm peritoneal implant just inferior to the antral region of the stomach has an SUV max of 12.57. Periportal adenopathy has an SUV max of 9.32. I do not see any intrahepatic metastatic disease. Peritoneal surface disease is noted. No pancreatic mass is identified. The uterus is markedly abnormal with findings suspicious for endometrial cancer. Diffuse irregular hypermetabolism noted with SUV max of 10.64. No ovarian lesions are identified. Area of moderate wall thickening involving the sigmoid colon demonstrates marked hypermetabolism with SUV max of 13.58. This could reflect the patient's primary tumor. Incidental CT findings: Moderate volume abdominal/pelvic ascites. SKELETON: No bone lesions are identified. Incidental CT findings: none IMPRESSION: 1. Diffuse and extensive peritoneal carcinomatosis as detailed above. The uterus and the sigmoid colon are abnormal and the primary neoplasm could be the sigmoid colon or possibly endometrial carcinoma. Given their close proximity it is also possible that this is sigmoid colon cancer invading the uterus or less likely vice versa. Recommend omental biopsy or sigmoidoscopy. 2. Single left upper lobe pulmonary nodule is mildly hypermetabolic and could reflect metastatic disease. Electronically Signed   By: Marijo Sanes M.D.   On: 09/13/2019 12:16   US Venous Img Lower Bilateral  Result Date: 10/11/2019 CLINICAL DATA:  Bilateral leg pain for 4 days. EXAM: BILATERAL LOWER EXTREMITY VENOUS DOPPLER ULTRASOUND TECHNIQUE: Gray-scale sonography  with compression, as well as color and duplex ultrasound, were performed to evaluate the deep venous system(s) from the level of the common femoral vein through the popliteal and proximal calf veins. COMPARISON:  None. FINDINGS: BILATERAL VENOUS Normal compressibility of the common femoral, superficial femoral, and popliteal veins,  as well as the visualized calf veins. Visualized portions of profunda femoral vein and great saphenous vein unremarkable. No filling defects to suggest DVT on grayscale or color Doppler imaging. Doppler waveforms show normal direction of venous flow, normal respiratory plasticity and response to augmentation. OTHER None. Limitations: none IMPRESSION: Negative for DVT in the bilateral lower extremities. Electronically Signed   By: Audie Pinto M.D.   On: 10/11/2019 13:54    Assessment and plan- Patient is a 64 y.o. female diagnosed with high-grade serous carcinoma currently receiving carbotaxol chemotherapy who presents to symptom management clinic for complaints of knee pain r > l. Etiology unclear. Ultrasound negative for dvt. Exam reassuring today. Suspect msk etiology vs arthralgias secondary to chemotherapy. No gcsf use. Continue tylenol for moderate pain limiting to 3000 mg in 24 hour period. Start tramadol 50 mg q6h prn for pain unrelieved by tylenol. Will do short course of low dose prednisone 27m daily x 5 days. Conservative measures reviewed.   Follow up with medical oncology as planned. Patient advised to notify the clinic if there is no improvement in symptoms or if symptoms worsen in next 3-4 days.     Visit Diagnosis 1. Acute pain of right knee     Patient expressed understanding and was in agreement with this plan. She also understands that She can call clinic at any time with any questions, concerns, or complaints.   Thank you for allowing me to participate in the care of this very pleasant patient.   LBeckey Rutter DNP, AGNP-[facility] CC: Dr. BRogue Bussing

## 2019-10-15 ENCOUNTER — Inpatient Hospital Stay: Payer: Medicare Other

## 2019-10-15 ENCOUNTER — Other Ambulatory Visit: Payer: Self-pay

## 2019-10-15 DIAGNOSIS — C541 Malignant neoplasm of endometrium: Secondary | ICD-10-CM

## 2019-10-15 DIAGNOSIS — Z5111 Encounter for antineoplastic chemotherapy: Secondary | ICD-10-CM | POA: Diagnosis not present

## 2019-10-15 LAB — BASIC METABOLIC PANEL
Anion gap: 8 (ref 5–15)
BUN: 14 mg/dL (ref 8–23)
CO2: 25 mmol/L (ref 22–32)
Calcium: 9.5 mg/dL (ref 8.9–10.3)
Chloride: 104 mmol/L (ref 98–111)
Creatinine, Ser: 0.82 mg/dL (ref 0.44–1.00)
GFR, Estimated: >60 mL/min (ref >60)
Glucose, Bld: 106 mg/dL — ABNORMAL HIGH (ref 70–99)
Potassium: 4.4 mmol/L (ref 3.5–5.1)
Sodium: 137 mmol/L (ref 135–145)

## 2019-10-15 LAB — CBC WITH DIFFERENTIAL/PLATELET
Abs Immature Granulocytes: 0.01 10*3/uL (ref 0.00–0.07)
Basophils Absolute: 0 10*3/uL (ref 0.0–0.1)
Basophils Relative: 0 %
Eosinophils Absolute: 0 10*3/uL (ref 0.0–0.5)
Eosinophils Relative: 1 %
HCT: 36.1 % (ref 36.0–46.0)
Hemoglobin: 12.5 g/dL (ref 12.0–15.0)
Immature Granulocytes: 0 %
Lymphocytes Relative: 32 %
Lymphs Abs: 0.7 10*3/uL (ref 0.7–4.0)
MCH: 31.8 pg (ref 26.0–34.0)
MCHC: 34.6 g/dL (ref 30.0–36.0)
MCV: 91.9 fL (ref 80.0–100.0)
Monocytes Absolute: 0.2 10*3/uL (ref 0.1–1.0)
Monocytes Relative: 10 %
Neutro Abs: 1.3 10*3/uL — ABNORMAL LOW (ref 1.7–7.7)
Neutrophils Relative %: 57 %
Platelets: 190 10*3/uL (ref 150–400)
RBC: 3.93 MIL/uL (ref 3.87–5.11)
RDW: 13.6 % (ref 11.5–15.5)
WBC: 2.3 10*3/uL — ABNORMAL LOW (ref 4.0–10.5)
nRBC: 0 % (ref 0.0–0.2)

## 2019-10-17 ENCOUNTER — Other Ambulatory Visit: Payer: Medicare Other

## 2019-10-17 NOTE — Progress Notes (Signed)
Tumor Board Documentation  Jeanette Allen was presented by Mariea Clonts, RN at our Tumor Board on 10/17/2019, which included representatives from medical oncology, radiation oncology, surgical oncology, pathology, genetics.  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 Genetic testing negative.).  Additionally, we reviewed previous medical and familial history, history of present illness, and recent lab results along with all available histopathologic and imaging studies. The tumor board considered available treatment options and made the following recommendations: Surgery surgery vs additional chemotherapy; send [facility] after surgery for Jeanette Allen  The following procedures/referrals were also placed: No orders of the defined types were placed in this encounter.   Clinical Trial Status:     Staging used:    National site-specific guidelines   were discussed with respect to the case.  Tumor board is a meeting of clinicians from various specialty areas who evaluate and discuss patients for whom a multidisciplinary approach is being considered. Final determinations in the plan of care are those of the provider(s). The responsibility for follow up of recommendations given during tumor board is that of the provider.   Today's extended care, comprehensive team conference, Jeanette Allen was not present for the discussion and was not examined.

## 2019-10-26 ENCOUNTER — Inpatient Hospital Stay: Payer: Medicare Other

## 2019-10-26 ENCOUNTER — Other Ambulatory Visit: Payer: Self-pay

## 2019-10-26 ENCOUNTER — Encounter: Payer: Self-pay | Admitting: Internal Medicine

## 2019-10-26 ENCOUNTER — Inpatient Hospital Stay (HOSPITAL_BASED_OUTPATIENT_CLINIC_OR_DEPARTMENT_OTHER): Payer: Medicare Other | Admitting: Internal Medicine

## 2019-10-26 DIAGNOSIS — C541 Malignant neoplasm of endometrium: Secondary | ICD-10-CM

## 2019-10-26 DIAGNOSIS — Z5111 Encounter for antineoplastic chemotherapy: Secondary | ICD-10-CM | POA: Diagnosis not present

## 2019-10-26 DIAGNOSIS — Z23 Encounter for immunization: Secondary | ICD-10-CM

## 2019-10-26 DIAGNOSIS — Z7189 Other specified counseling: Secondary | ICD-10-CM

## 2019-10-26 LAB — CBC WITH DIFFERENTIAL/PLATELET
Abs Immature Granulocytes: 0.01 10*3/uL (ref 0.00–0.07)
Basophils Absolute: 0 10*3/uL (ref 0.0–0.1)
Basophils Relative: 1 %
Eosinophils Absolute: 0.1 10*3/uL (ref 0.0–0.5)
Eosinophils Relative: 1 %
HCT: 40.4 % (ref 36.0–46.0)
Hemoglobin: 13.5 g/dL (ref 12.0–15.0)
Immature Granulocytes: 0 %
Lymphocytes Relative: 30 %
Lymphs Abs: 1.1 10*3/uL (ref 0.7–4.0)
MCH: 31.6 pg (ref 26.0–34.0)
MCHC: 33.4 g/dL (ref 30.0–36.0)
MCV: 94.6 fL (ref 80.0–100.0)
Monocytes Absolute: 0.4 10*3/uL (ref 0.1–1.0)
Monocytes Relative: 11 %
Neutro Abs: 2.2 10*3/uL (ref 1.7–7.7)
Neutrophils Relative %: 57 %
Platelets: 159 10*3/uL (ref 150–400)
RBC: 4.27 MIL/uL (ref 3.87–5.11)
RDW: 15.2 % (ref 11.5–15.5)
WBC: 3.8 10*3/uL — ABNORMAL LOW (ref 4.0–10.5)
nRBC: 0 % (ref 0.0–0.2)

## 2019-10-26 LAB — COMPREHENSIVE METABOLIC PANEL
ALT: 42 U/L (ref 0–44)
AST: 44 U/L — ABNORMAL HIGH (ref 15–41)
Albumin: 4.2 g/dL (ref 3.5–5.0)
Alkaline Phosphatase: 88 U/L (ref 38–126)
Anion gap: 7 (ref 5–15)
BUN: 15 mg/dL (ref 8–23)
CO2: 26 mmol/L (ref 22–32)
Calcium: 9.6 mg/dL (ref 8.9–10.3)
Chloride: 105 mmol/L (ref 98–111)
Creatinine, Ser: 0.82 mg/dL (ref 0.44–1.00)
GFR, Estimated: >60 mL/min (ref >60)
Glucose, Bld: 125 mg/dL — ABNORMAL HIGH (ref 70–99)
Potassium: 4.2 mmol/L (ref 3.5–5.1)
Sodium: 138 mmol/L (ref 135–145)
Total Bilirubin: 0.6 mg/dL (ref 0.3–1.2)
Total Protein: 8 g/dL (ref 6.5–8.1)

## 2019-10-26 MED ORDER — SODIUM CHLORIDE 0.9 % IV SOLN
542.4000 mg | Freq: Once | INTRAVENOUS | Status: AC
  Administered 2019-10-26: 540 mg via INTRAVENOUS
  Filled 2019-10-26: qty 54

## 2019-10-26 MED ORDER — SODIUM CHLORIDE 0.9 % IV SOLN
175.0000 mg/m2 | Freq: Once | INTRAVENOUS | Status: AC
  Administered 2019-10-26: 318 mg via INTRAVENOUS
  Filled 2019-10-26: qty 53

## 2019-10-26 MED ORDER — PALONOSETRON HCL INJECTION 0.25 MG/5ML
0.2500 mg | Freq: Once | INTRAVENOUS | Status: AC
  Administered 2019-10-26: 0.25 mg via INTRAVENOUS
  Filled 2019-10-26: qty 5

## 2019-10-26 MED ORDER — INFLUENZA VAC A&B SA ADJ QUAD 0.5 ML IM PRSY
0.5000 mL | PREFILLED_SYRINGE | Freq: Once | INTRAMUSCULAR | Status: AC
  Administered 2019-10-26: 0.5 mL via INTRAMUSCULAR
  Filled 2019-10-26: qty 0.5

## 2019-10-26 MED ORDER — SODIUM CHLORIDE 0.9 % IV SOLN
Freq: Once | INTRAVENOUS | Status: AC
  Filled 2019-10-26: qty 250

## 2019-10-26 MED ORDER — DIPHENHYDRAMINE HCL 50 MG/ML IJ SOLN
50.0000 mg | Freq: Once | INTRAMUSCULAR | Status: AC
  Administered 2019-10-26: 50 mg via INTRAVENOUS
  Filled 2019-10-26: qty 1

## 2019-10-26 MED ORDER — LORAZEPAM 0.5 MG PO TABS
0.5000 mg | ORAL_TABLET | Freq: Once | ORAL | Status: AC
  Administered 2019-10-26: 0.5 mg via ORAL
  Filled 2019-10-26: qty 1

## 2019-10-26 MED ORDER — FAMOTIDINE IN NACL 20-0.9 MG/50ML-% IV SOLN
20.0000 mg | Freq: Once | INTRAVENOUS | Status: AC
  Administered 2019-10-26: 20 mg via INTRAVENOUS
  Filled 2019-10-26: qty 50

## 2019-10-26 MED ORDER — SODIUM CHLORIDE 0.9 % IV SOLN
150.0000 mg | Freq: Once | INTRAVENOUS | Status: AC
  Administered 2019-10-26: 150 mg via INTRAVENOUS
  Filled 2019-10-26: qty 5

## 2019-10-26 MED ORDER — SODIUM CHLORIDE 0.9 % IV SOLN
10.0000 mg | Freq: Once | INTRAVENOUS | Status: AC
  Administered 2019-10-26: 10 mg via INTRAVENOUS
  Filled 2019-10-26: qty 10

## 2019-10-26 MED ORDER — HEPARIN SOD (PORK) LOCK FLUSH 100 UNIT/ML IV SOLN
500.0000 [IU] | Freq: Once | INTRAVENOUS | Status: DC | PRN
  Filled 2019-10-26: qty 5

## 2019-10-26 NOTE — Progress Notes (Signed)
[facility] CONSULT NOTE  Patient Care Team: Danelle Berry, NP as PCP - General (Nurse Practitioner) Clent Jacks, RN as Oncology Nurse Navigator  CHIEF COMPLAINTS/PURPOSE OF CONSULTATION: high grade serous cancer   #  Oncology History Overview Note  #August 2021-ascitic fluid cytology-high-grade serous carcinoma of gynecologic origin; CT scan-behind the abdominal wall 4.6 x 2.1 mass ; within the mesentery 6.7 x 4.5 cm left of mid abdomen . BIOPSY of the peritoneal mass; high grade carcinoma ca-[phone] [Dr.Byrnett]; SEP 9th 2021- PET scan-shows peritoneal carcinomatosis; uterine uptake [Dr.Secord; unable to biopsy cervical stenosis] sigmoid colonic uptake concerning for malignancy.  No ovarian uptake.  CEA 125 +1300; CEA-2.    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     # PALLIATIVE CARE EVALUATION:  # PAIN MANAGEMENT:    DIAGNOSIS: Uterine cancer  STAGE:   IV      ;  GOALS: control  CURRENT/MOST RECENT THERAPY : Carbotaxol    Uterine cancer ([place]) (Resolved)  09/04/2019 Initial Diagnosis   Gynecologic cancer [facility])   Endometrial cancer ([place])  09/14/2019 -  Chemotherapy   The patient had dexamethasone (DECADRON) 4 MG tablet, 8 mg, Oral, Daily, 1 of 1 cycle, Start date: --, End date: -- palonosetron (ALOXI) injection 0.25 mg, 0.25 mg, Intravenous,  Once, 3 of 6 cycles Administration: 0.25 mg (09/14/2019), 0.25 mg (10/05/2019) CARBOplatin (PARAPLATIN) 580 mg in sodium chloride 0.9 % 250 mL chemo infusion, 580 mg (100 % of original dose 577.8 mg), Intravenous,  Once, 3 of 6 cycles Dose modification:   (original dose 577.8 mg, Cycle 1) Administration: 580 mg (09/14/2019), 540 mg (10/05/2019) fosaprepitant (EMEND) 150 mg in sodium chloride 0.9 % 145 mL IVPB, 150 mg,  Intravenous,  Once, 3 of 6 cycles Administration: 150 mg (09/14/2019), 150 mg (10/05/2019) PACLitaxel (TAXOL) 318 mg in sodium chloride 0.9 % 500 mL chemo infusion (> 98m/m2), 175 mg/m2 = 318 mg, Intravenous,  Once, 3 of 6 cycles Administration: 318 mg (09/14/2019), 318 mg (10/05/2019)  for chemotherapy treatment.    10/05/2019 Initial Diagnosis   Endometrial cancer (H[facility])    Genetic Testing   Negative germline genetic testing. No pathogenic variants identified on the [facility]. The report date is 10/01/2019. HRD testing ([employer]) still pending.   The [facility] panel offered by [employer] sequencing and deletion/duplication testing of the following 35 genes: APC, ATM, AXIN2, BARD1, BMPR1A, BRCA1, BRCA2, BRIP1, CHD1, CDK4, CDKN2A, CHEK2, EPCAM (large rearrangement only), HOXB13, GALNT12, MLH1, MSH2, MSH3, MSH6, MUTYH, NBN, NTHL1, PALB2, PMS2, PTEN, RAD51C, RAD51D, RNF43, RPS20, SMAD4, STK11, and TP53. Sequencing was performed for select regions of POLE and POLD1, and large rearrangement analysis was performed for select regions of GREM1.       HISTORY OF PRESENTING ILLNESS:  Jeanette Allen 64y.o.  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  Patient status post cycle #2 of chemotherapy. She  feels improved in terms of abdominal distention and abdominal pain.  No diarrhea no constipation.  No fevers or chills.  Patient noted to have nausea intermittent..  Especially morning.  No vomiting.  Denies any tingling or numbness.  Review of Systems  Constitutional: Positive for malaise/fatigue. Negative for chills, diaphoresis, fever and weight loss.  HENT: Negative for nosebleeds and sore throat.   Eyes: Negative for double  vision.  Respiratory: Negative for cough, hemoptysis, sputum production, shortness of breath and wheezing.   Cardiovascular: Negative for chest pain,  palpitations, orthopnea and leg swelling.  Gastrointestinal: Positive for abdominal pain and nausea. Negative for blood in stool, constipation, diarrhea, heartburn, melena and vomiting.  Genitourinary: Negative for dysuria, frequency and urgency.  Musculoskeletal: Negative for back pain and joint pain.  Skin: Negative.  Negative for itching and rash.  Neurological: Negative for dizziness, tingling, focal weakness, weakness and headaches.  Endo/Heme/Allergies: Does not bruise/bleed easily.  Psychiatric/Behavioral: Negative for depression. The patient is not nervous/anxious and does not have insomnia.      MEDICAL HISTORY:  Past Medical History:  Diagnosis Date  . Arthritis   . Hypertension     SURGICAL HISTORY: Past Surgical History:  Procedure Laterality Date  . APPENDECTOMY    . COLONOSCOPY WITH PROPOFOL N/A 10/01/2019   Procedure: COLONOSCOPY WITH PROPOFOL;  Surgeon: Lin Landsman, MD;  Location[facility] ENDOSCOPY;  Service: Gastroenterology;  Laterality: N/A;  . ESOPHAGOGASTRODUODENOSCOPY (EGD) WITH PROPOFOL N/A 09/12/2019   Procedure: ESOPHAGOGASTRODUODENOSCOPY (EGD) WITH PROPOFOL;  Surgeon: Lin Landsman, MD;  Location: [facility] ENDOSCOPY;  Service: Gastroenterology;  Laterality: N/A;    SOCIAL HISTORY: Social History   Socioeconomic History  . Marital status: Married    Spouse name: Not on file  . Number of children: Not on file  . Years of education: Not on file  . Highest education level: Not on file  Occupational History  . Not on file  Tobacco Use  . Smoking status: Current Every Day Smoker  . Smokeless tobacco: Never Used  Vaping Use  . Vaping Use: Never used  Substance and Sexual Activity  . Alcohol use: Not Currently  . Drug use: Never  . Sexual activity: Yes  Other Topics Concern  . Not on file  Social History Narrative   Lives with husband; near [place]. Worked Network engineer at [employer]; smoke 1/2 ppd/slowed; no alcohol; 3 children [2 boys; one girl-  [employer]]   Social Determinants of Health   Financial Resource Strain:   . Difficulty of Paying Living Expenses: Not on file  Food Insecurity:   . Worried About Charity fundraiser in the Last Year: Not on file  . Ran Out of Food in the Last Year: Not on file  Transportation Needs:   . Lack of Transportation (Medical): Not on file  . Lack of Transportation (Non-Medical): Not on file  Physical Activity:   . Days of Exercise per Week: Not on file  . Minutes of Exercise per Session: Not on file  Stress:   . Feeling of Stress : Not on file  Social Connections:   . Frequency of Communication with Friends and Family: Not on file  . Frequency of Social Gatherings with Friends and Family: Not on file  . Attends Religious Services: Not on file  . Active Member of Clubs or Organizations: Not on file  . Attends Archivist Meetings: Not on file  . Marital Status: Not on file  Intimate Partner Violence:   . Fear of Current or Ex-Partner: Not on file  . Emotionally Abused: Not on file  . Physically Abused: Not on file  . Sexually Abused: Not on file    FAMILY HISTORY: Family History  Problem Relation Age of Onset  . Diabetes Maternal Grandmother   . Hypertension Maternal Grandmother   . Stroke Maternal Grandmother     ALLERGIES:  has No Known Allergies.  MEDICATIONS:  Current Outpatient Medications  Medication Sig Dispense Refill  . ergocalciferol (VITAMIN D2) 1.25 MG (50000 UT) capsule Take by mouth.    Marland Kitchen FLUoxetine (PROZAC) 20 MG capsule Take by mouth.    Marland Kitchen LORazepam (ATIVAN) 0.5 MG tablet Take 1-2 pill at night for anxiety/nausea. 60 tablet 0  . losartan (COZAAR) 50 MG tablet Take by mouth.    Marland Kitchen omeprazole (PRILOSEC) 40 MG capsule Take 1 capsule (40 mg total) by mouth 2 (two) times daily before a meal. 30 capsule 3  . ondansetron (ZOFRAN) 8 MG tablet One pill every 8 hours as needed for nausea/vomitting. 60 tablet 1  . ondansetron (ZOFRAN-ODT) 4 MG  disintegrating tablet Take 4 mg by mouth every 8 (eight) hours as needed for nausea or vomiting.     . prochlorperazine (COMPAZINE) 10 MG tablet Take 1 tablet (10 mg total) by mouth every 6 (six) hours as needed for nausea or vomiting. 40 tablet 1  . traMADol (ULTRAM) 50 MG tablet Take 1 tablet (50 mg total) by mouth every 6 (six) hours as needed for severe pain. 15 tablet 0  . Pitavastatin Calcium (LIVALO) 4 MG TABS Take 4 mg by mouth daily. (Patient not taking: Reported on 10/11/2019)     No current facility-administered medications for this visit.   Facility-Administered Medications Ordered in Other Visits  Medication Dose Route Frequency Provider Last Rate Last Admin  . CARBOplatin (PARAPLATIN) 540 mg in sodium chloride 0.9 % 250 mL chemo infusion  540 mg Intravenous Once Charlaine Dalton R, MD      . heparin lock flush 100 unit/mL  500 Units Intracatheter Once PRN Cammie Sickle, MD      . PACLitaxel (TAXOL) 318 mg in sodium chloride 0.9 % 500 mL chemo infusion (> 86m/m2)  175 mg/m2 (Treatment Plan Recorded) Intravenous Once BCammie Sickle MD 184 mL/hr at 10/26/19 1103 318 mg at 10/26/19 1103      .  PHYSICAL EXAMINATION: ECOG PERFORMANCE STATUS: 1 - Symptomatic but completely ambulatory  Vitals:   10/26/19 0834  BP: (!) 153/89  Pulse: 96  Resp: 16  Temp: (!) 97.2 F (36.2 C)  SpO2: 100%   Filed Weights   10/26/19 0834  Weight: 153 lb (69.4 kg)    Physical Exam Constitutional:      Comments: Ambulating independently.  Accompanied by husband.  HENT:     Head: Normocephalic and atraumatic.     Mouth/Throat:     Pharynx: No oropharyngeal exudate.  Eyes:     Pupils: Pupils are equal, round, and reactive to light.  Cardiovascular:     Rate and Rhythm: Normal rate and regular rhythm.  Pulmonary:     Effort: Pulmonary effort is normal. No respiratory distress.     Breath sounds: Normal breath sounds. No wheezing.  Abdominal:     General: Bowel sounds  are normal. There is distension.     Palpations: Abdomen is soft. There is no mass.     Tenderness: There is no abdominal tenderness. There is no guarding or rebound.  Musculoskeletal:        General: No tenderness. Normal range of motion.     Cervical back: Normal range of motion and neck supple.  Skin:    General: Skin is warm.  Neurological:     Mental Status: She is alert and oriented to person, place, and time.  Psychiatric:        Mood and Affect: Affect normal.      LABORATORY DATA:  I have reviewed the data as listed Lab Results  Component Value Date   WBC 3.8 (L) 10/26/2019   HGB 13.5 10/26/2019   HCT 40.4 10/26/2019   MCV 94.6 10/26/2019   PLT 159 10/26/2019   Recent Labs    09/14/19 0813 09/14/19 0813 09/28/19 1006 09/28/19 1006 10/05/19 0807 10/15/19 1107 10/26/19 0814  NA 139   < > 138   < > 140 137 138  K 4.0   < > 4.0   < > 4.3 4.4 4.2  CL 104   < > 104   < > 104 104 105  CO2 24   < > 26   < > '27 25 26  ' GLUCOSE 104*   < > 106*   < > 108* 106* 125*  BUN 12   < > 14   < > '13 14 15  ' CREATININE 0.93   < > 0.73   < > 0.86 0.82 0.82  CALCIUM 9.2   < > 9.0   < > 9.9 9.5 9.6  GFRNONAA >60   < > >60   < > >60 >60 >60  GFRAA >60  --  >60  --  >60  --   --   PROT 7.5  --   --   --  7.6  --  8.0  ALBUMIN 3.5  --   --   --  4.0  --  4.2  AST 30  --   --   --  37  --  44*  ALT 21  --   --   --  30  --  42  ALKPHOS 71  --   --   --  72  --  88  BILITOT 0.5  --   --   --  0.5  --  0.6   < > = values in this interval not displayed.    RADIOGRAPHIC STUDIES: I have personally reviewed the radiological images as listed and agreed with the findings in the report. US Venous Img Lower Bilateral  Result Date: 10/11/2019 CLINICAL DATA:  Bilateral leg pain for 4 days. EXAM: BILATERAL LOWER EXTREMITY VENOUS DOPPLER ULTRASOUND TECHNIQUE: Gray-scale sonography with compression, as well as color and duplex ultrasound, were performed to evaluate the deep venous system(s)  from the level of the common femoral vein through the popliteal and proximal calf veins. COMPARISON:  None. FINDINGS: BILATERAL VENOUS Normal compressibility of the common femoral, superficial femoral, and popliteal veins, as well as the visualized calf veins. Visualized portions of profunda femoral vein and great saphenous vein unremarkable. No filling defects to suggest DVT on grayscale or color Doppler imaging. Doppler waveforms show normal direction of venous flow, normal respiratory plasticity and response to augmentation. OTHER None. Limitations: none IMPRESSION: Negative for DVT in the bilateral lower extremities. Electronically Signed   By: Audie Pinto M.D.   On: 10/11/2019 13:54    ASSESSMENT & PLAN:   Endometrial cancer ([place]) # STAGE IV-High-grade serous carcinoma of gynecologic origin based on cytology/biopsy of the peritoneal mass; GI immunohistochemistry negative. SEP 9th 2021- PET scan-shows peritoneal carcinomatosis; uterine uptake [unable to biopsy because of cervical stenosis]; sigmoid colonic [see below] uptake concerning for malignancy. CEA 125 +1500; CEA- improving.  Currently on carbotaxol chemotherapy.   #Proceed with cycle #3 of carbotaxol. Labs today reviewed;  acceptable for treatment today. Will order CT C/A/P prior to gyn-onc visit. Will make tentative appt in 3 weeks; for cycle #4. However will decide on the #4  chemotherapy based on gyn-onc evaluation.   #Ascites-malignant/peritoneal carcinomatosis-clinically symptoms improved however exam no significant change noted.  Overall stable we will continue monitor closely.  # Nausea- Anxiety related/slightly improved not resolved; continue ativan prn.  # DISPOSITION: # Flu shot today # carbo-taxol today # CT C/A/P on Nov 1st or 2nd #  labs-cbc/bmp [with gyn appt on NOv 3rd] # Follow up in 3 week- MD; labs- CBC/CMP/ca-125- carbo-taxol; Dr.B    All questions were answered. The patient knows to call the clinic with any  problems, questions or concerns.   Cammie Sickle, MD 10/26/2019 1:12 PM

## 2019-10-26 NOTE — Assessment & Plan Note (Addendum)
#   STAGE IV-High-grade serous carcinoma of gynecologic origin based on cytology/biopsy of the peritoneal mass; GI immunohistochemistry negative. SEP 9th 2021- PET scan-shows peritoneal carcinomatosis; uterine uptake [unable to biopsy because of cervical stenosis]; sigmoid colonic [see below] uptake concerning for malignancy. CEA 125 +1500; CEA- improving.  Currently on carbotaxol chemotherapy.   #Proceed with cycle #3 of carbotaxol. Labs today reviewed;  acceptable for treatment today. Will order CT C/A/P prior to gyn-onc visit. Will make tentative appt in 3 weeks; for cycle #4. However will decide on the #4 chemotherapy based on gyn-onc evaluation.   #Ascites-malignant/peritoneal carcinomatosis-clinically symptoms improved however exam no significant change noted.  Overall stable we will continue monitor closely.  # Nausea- Anxiety related/slightly improved not resolved; continue ativan prn.  # DISPOSITION: # Flu shot today # carbo-taxol today # CT C/A/P on Nov 1st or 2nd #  labs-cbc/bmp [with gyn appt on NOv 3rd] # Follow up in 3 week- MD; labs- CBC/CMP/ca-125- carbo-taxol; Dr.B

## 2019-10-26 NOTE — Addendum Note (Signed)
Addended by: Delice Bison E on: 10/26/2019 04:05 PM   Modules accepted: Orders

## 2019-10-27 LAB — CA 125: Cancer Antigen (CA) 125: 614 U/mL — ABNORMAL HIGH (ref 0.0–38.1)

## 2019-10-30 ENCOUNTER — Telehealth: Payer: Self-pay | Admitting: Internal Medicine

## 2019-10-30 NOTE — Telephone Encounter (Signed)
On 10/25-spoke to patient regarding results of her CA-125; improving.  Await CT scan as planned follow-up with GYN ONC.   FYI

## 2019-11-06 ENCOUNTER — Ambulatory Visit
Admission: RE | Admit: 2019-11-06 | Discharge: 2019-11-06 | Disposition: A | Discharge status: Home / Self Care | Payer: Medicare Other | Source: Ambulatory Visit | Attending: Internal Medicine | Admitting: Internal Medicine

## 2019-11-06 ENCOUNTER — Other Ambulatory Visit: Payer: Self-pay

## 2019-11-06 DIAGNOSIS — C541 Malignant neoplasm of endometrium: Secondary | ICD-10-CM | POA: Insufficient documentation

## 2019-11-06 HISTORY — DX: Malignant (primary) neoplasm, unspecified: C80.1

## 2019-11-06 MED ORDER — IOHEXOL 300 MG/ML  SOLN
100.0000 mL | Freq: Once | INTRAMUSCULAR | Status: AC | PRN
  Administered 2019-11-06: 100 mL via INTRAVENOUS

## 2019-11-07 ENCOUNTER — Inpatient Hospital Stay: Payer: Medicare Other

## 2019-11-07 ENCOUNTER — Telehealth: Payer: Self-pay | Admitting: Internal Medicine

## 2019-11-07 ENCOUNTER — Telehealth: Payer: Self-pay | Admitting: *Deleted

## 2019-11-07 ENCOUNTER — Inpatient Hospital Stay: Payer: Medicare Other | Attending: Obstetrics and Gynecology | Admitting: Obstetrics and Gynecology

## 2019-11-07 VITALS — BP 133/83 | HR 92 | Temp 98.0°F | Resp 20 | Wt 154.6 lb

## 2019-11-07 DIAGNOSIS — I10 Essential (primary) hypertension: Secondary | ICD-10-CM | POA: Insufficient documentation

## 2019-11-07 DIAGNOSIS — B1920 Unspecified viral hepatitis C without hepatic coma: Secondary | ICD-10-CM | POA: Diagnosis not present

## 2019-11-07 DIAGNOSIS — C801 Malignant (primary) neoplasm, unspecified: Secondary | ICD-10-CM | POA: Diagnosis not present

## 2019-11-07 DIAGNOSIS — R911 Solitary pulmonary nodule: Secondary | ICD-10-CM | POA: Diagnosis not present

## 2019-11-07 DIAGNOSIS — C763 Malignant neoplasm of pelvis: Secondary | ICD-10-CM

## 2019-11-07 DIAGNOSIS — R971 Elevated cancer antigen 125 [CA 125]: Secondary | ICD-10-CM | POA: Insufficient documentation

## 2019-11-07 DIAGNOSIS — R180 Malignant ascites: Secondary | ICD-10-CM | POA: Insufficient documentation

## 2019-11-07 DIAGNOSIS — Z9221 Personal history of antineoplastic chemotherapy: Secondary | ICD-10-CM | POA: Insufficient documentation

## 2019-11-07 DIAGNOSIS — C541 Malignant neoplasm of endometrium: Secondary | ICD-10-CM

## 2019-11-07 DIAGNOSIS — C786 Secondary malignant neoplasm of retroperitoneum and peritoneum: Secondary | ICD-10-CM | POA: Diagnosis not present

## 2019-11-07 LAB — CBC WITH DIFFERENTIAL/PLATELET
Abs Immature Granulocytes: 0.01 10*3/uL (ref 0.00–0.07)
Basophils Absolute: 0 10*3/uL (ref 0.0–0.1)
Basophils Relative: 1 %
Eosinophils Absolute: 0.1 10*3/uL (ref 0.0–0.5)
Eosinophils Relative: 3 %
HCT: 35.6 % — ABNORMAL LOW (ref 36.0–46.0)
Hemoglobin: 12.1 g/dL (ref 12.0–15.0)
Immature Granulocytes: 1 %
Lymphocytes Relative: 40 %
Lymphs Abs: 0.8 10*3/uL (ref 0.7–4.0)
MCH: 32.2 pg (ref 26.0–34.0)
MCHC: 34 g/dL (ref 30.0–36.0)
MCV: 94.7 fL (ref 80.0–100.0)
Monocytes Absolute: 0.3 10*3/uL (ref 0.1–1.0)
Monocytes Relative: 16 %
Neutro Abs: 0.8 10*3/uL — ABNORMAL LOW (ref 1.7–7.7)
Neutrophils Relative %: 39 %
Platelets: 153 10*3/uL (ref 150–400)
RBC: 3.76 MIL/uL — ABNORMAL LOW (ref 3.87–5.11)
RDW: 15.9 % — ABNORMAL HIGH (ref 11.5–15.5)
WBC: 1.9 10*3/uL — ABNORMAL LOW (ref 4.0–10.5)
nRBC: 0 % (ref 0.0–0.2)

## 2019-11-07 LAB — BASIC METABOLIC PANEL
Anion gap: 6 (ref 5–15)
BUN: 14 mg/dL (ref 8–23)
CO2: 26 mmol/L (ref 22–32)
Calcium: 9.5 mg/dL (ref 8.9–10.3)
Chloride: 108 mmol/L (ref 98–111)
Creatinine, Ser: 0.97 mg/dL (ref 0.44–1.00)
GFR, Estimated: >60 mL/min (ref >60)
Glucose, Bld: 84 mg/dL (ref 70–99)
Potassium: 4.3 mmol/L (ref 3.5–5.1)
Sodium: 140 mmol/L (ref 135–145)

## 2019-11-07 NOTE — Telephone Encounter (Signed)
On 11/02- LVM for the patient informing of improved disease on the CT scan post chemotherapy.  Await evaluation with gynecology oncology regarding plan for surgery.

## 2019-11-07 NOTE — Patient Instructions (Signed)
You will see Dr Theora Gianotti at [facility] on 11/27/19 at 1:00pm. They will email you the address and directions. If you have any questions you can call [phone], option 1. Surgery is being planned 12/13/19

## 2019-11-07 NOTE — Telephone Encounter (Signed)
See report

## 2019-11-07 NOTE — Progress Notes (Signed)
Gynecologic Oncology Interval Visit   Referring Provider: Dr. 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 G4P3 female (h/o ruptured ectopic s/p XL) who is seen in consultation from Dr. Rogue Bussing and Bary Castilla for elevated high grade serous cancer s/p 3 cycles of carboplatin-Taxol chemotherapy who returns to clinic for re-evaluation. She has a medical history significant for Hepatitis C (ectopic pregnancy in 1990 requiring transfusion).   Treatment Summary:  09/14/19- Cycle 1  CA125 1,501 10/05/19- Cycle 2  CA125 1,196 10/26/19- Cycle 3 CA125 614  11/06/2019- CT for restaging- Decreased ascites and decreased size of bulky omental disease and areas of peritoneal disease. Omental disease remains bulky as described. Mild to moderate improvement in upper abdominal adenopathy. Particularly with respect to gastrohepatic implants or lymph nodes that were seen previously. Resolution of LEFT- sided pleural fluid. Subjective decrease in mesenteric thickening. New signs of disease though smaller areas would be difficult to detect given the extensive nature of preexistent disease. No evidence of metastatic disease to the chest. Aortic atherosclerosis. Mild distension of distal small bowel in the setting of peritoneal disease with mild angulation of some small bowel loops.   Today she says that for 1-2 days following chemotherapy she is tired, has mild constipation relieved by otcs but symptoms improve between treatments.   Gynecologic Oncology History Jeanette Allen is a pleasant female who is seen in consultation from Dr. Charlynn Grimes and Bary Castilla for elevated CA125 and ascites. She has a medical history significant for Hepatitis C (ectopic pregnancy in 1990 requiring transfusion).  Her history is as follows:  She presented with abdominal pain for 2-3 weeks, daily nausea (episode of emesis of CT  contrast), early satiety and abdominal bloating.    08/20/2019 US Abdomen  Cirrhotic liver with small amount of perihepatic ascites. Multiple peripancreatic and perihepatic hypoechoic nodules, the largest of which measures 2.0 cm at the inferior liver edge. Recommend CT for further evaluation.  08/22/2019 The patient underwent a contrast-enhanced CT. This described possibility of gas around the gallbladder. Cholecystitis is a consideration.  Extensive abdominal and pelvic ascites. No retroperitoneal masses. No evidence of an acute inflammatory process. Suspected uterine fibroid. No free air.   08/23/2019 She saw Dr. Bary Castilla for evaluation given gallbladder findings.   Labs: elevated hepatitis C virus antibody. Liver function with elevated transaminases. Normal total bilirubin, albumin and electrolytes. Normal renal function. CBC showed a hemoglobin of 15.6 with an MCV of 95, white blood cell count of 6500 with normal differential. Hemoglobin A1c 5.5.  CA125= 1228; CEA= 2.1; alpha-fetoprotein = 2,2, and CA19-9 <2.  08/27/2019 Paracentesis total of approximately 1250 mL of clear, amber colored fluid was Removed. LDH 334; ANC42; total nucleated cell 3,035; No WBC; No organisms; No growth; Albumin 2.8; total protein 5.0; Glucose 98. Cytology- POSITIVE FOR MALIGNANCY. - COMPATIBLE WITH HIGH-GRADE SEROUS CARCINOMA.   Attempted endometrial biopsy but procedure aborted due to cervical stenosis.   09/07/2019 biopsy of peritoneal mass DIAGNOSIS:  A. PERITONEAL MASS, LEFT LATERAL; CT-GUIDED CORE BIOPSY:  - METASTATIC HIGH-GRADE SEROUS CARCINOMA.   09/12/2019 DUODENAL BULB; COLD BIOPSY DIAGNOSIS:  A. DUODENAL BULB; COLD BIOPSY:  - DUODENAL MUCOSA DISPLAYING NORMAL VILLOUS ARCHITECTURE AND MILD  BRUNNER GLAND HYPERPLASIA.  - NEGATIVE FOR FEATURES OF CELIAC, DYSPLASIA, AND MALIGNANCY.   PET 09/13/2019 IMPRESSION: 1. Diffuse and extensive peritoneal carcinomatosis as detailed above. The uterus and the  sigmoid colon are abnormal and the primary  neoplasm could be the sigmoid colon or possibly endometrial carcinoma. Given their close proximity it is also possible that this is sigmoid colon cancer invading the uterus or less likely vice versa. Recommend omental biopsy or sigmoidoscopy. 2. Single left upper lobe pulmonary nodule is mildly hypermetabolic and could reflect metastatic disease.  Given extensive nature of disease recommendation was for NACT.   10/11/19- US Venous Bilateral for leg pain post chemotherapy was negative for DVT  Genetic testing - Myriad MyRisk was negative.  Tumor testing - Omniseq    COVID vaccination 05/24/2019 and 06/21/2019  Problem List: Patient Active Problem List   Diagnosis Date Noted  . Genetic testing 10/11/2019  . Endometrial cancer ([name]) 10/05/2019  . Abnormal PET scan of colon   . 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 Goals of care, counseling/discussion 09/04/2019  . Hepatitis C infection 08/29/2019  . CAD (coronary artery disease) 08/29/2019  . Depression 08/29/2019  . Esophageal reflux 08/29/2019  . Hyperlipidemia 08/29/2019  . Hypertension 08/29/2019  . Nonrheumatic mitral valve disorder 08/29/2019  . Vitamin B12 deficiency 08/29/2019  . Vitamin D deficiency 08/29/2019  . Tobacco abuse 08/29/2019    Past Medical History: Past Medical History:  Diagnosis Date  . Arthritis   . Cancer ([place])   . Hypertension     Past Surgical History: Past Surgical History:  Procedure Laterality Date  . APPENDECTOMY    . COLONOSCOPY WITH PROPOFOL N/A 10/01/2019   Procedure: COLONOSCOPY WITH PROPOFOL;  Surgeon: Lin Landsman, MD;  Location: [facility] ENDOSCOPY;  Service: Gastroenterology;  Laterality: N/A;  . ESOPHAGOGASTRODUODENOSCOPY (EGD) WITH PROPOFOL N/A 09/12/2019   Procedure: ESOPHAGOGASTRODUODENOSCOPY (EGD) WITH PROPOFOL;  Surgeon: Lin Landsman, MD;  Location: [facility] ENDOSCOPY;  Service: Gastroenterology;  Laterality: N/A;   colonoscopy 2011 Tubal pregnancy 1990 Tubal reversal  Past Gynecologic History:  Menarche: age 43 Last menstrual period: age 86   OB History:  OB History  Gravida Para Term Preterm AB Living  '4 3     1    ' SAB TAB Ectopic Multiple Live Births      1        # Outcome Date GA Lbr Len/2nd Weight Sex Delivery Anes PTL Lv  4 Ectopic           3 Para           2 Para           1 Para             Family History: Family History  Problem Relation Age of Onset  . Diabetes Maternal Grandmother   . Hypertension Maternal Grandmother   . Stroke Maternal Grandmother    Immunization History  Administered Date(s) Administered  . Fluad Quad(high Dose 65+) 10/26/2019  . Moderna SARS-COVID-2 Vaccination 05/24/2019, 06/21/2019   Social History: Social History   Socioeconomic History  . Marital status: Married    Spouse name: Not on file  . Number of children: Not on file  . Years of education: Not on file  . Highest education level: Not on file  Occupational History  . Not on file  Tobacco Use  . Smoking status: Current Every Day Smoker  . Smokeless tobacco: Never Used  Vaping Use  . Vaping Use: Never used  Substance and Sexual Activity  . Alcohol use: Not Currently  . Drug use: Never  . Sexual activity: Yes  Other Topics Concern  . Not on file  Social History Narrative   Lives with  husband; near [place]. 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 Difficulty of Paying Living Expenses: Not on file  Food Insecurity:   . Worried About Charity fundraiser in the Last Year: Not on file  . Ran Out of Food in the Last Year: Not on file  Transportation Needs:   . Lack of Transportation (Medical): Not on file  . Lack of Transportation (Non-Medical): Not on file  Physical Activity:   . Days of Exercise per Week: Not on file  . Minutes of  Exercise per Session: Not on file  Stress:   . Feeling of Stress : Not on file  Social Connections:   . Frequency of Communication with Friends and Family: Not on file  . Frequency of Social Gatherings with Friends and Family: Not on file  . Attends Religious Services: Not on file  . Active Member of Clubs or Organizations: Not on file  . Attends Archivist Meetings: Not on file  . Marital Status: Not on file  Intimate Partner Violence:   . Fear of Current or Ex-Partner: Not on file  . Emotionally Abused: Not on file  . Physically Abused: Not on file  . Sexually Abused: Not on file   Allergies: No Known Allergies  Current Medications: Current Outpatient Medications  Medication Sig Dispense Refill  . ergocalciferol (VITAMIN D2) 1.25 MG (50000 UT) capsule Take by mouth.    Marland Kitchen FLUoxetine (PROZAC) 20 MG capsule Take by mouth.    Marland Kitchen LORazepam (ATIVAN) 0.5 MG tablet Take 1-2 pill at night for anxiety/nausea. 60 tablet 0  . losartan (COZAAR) 50 MG tablet Take by mouth.    Marland Kitchen omeprazole (PRILOSEC) 40 MG capsule Take 1 capsule (40 mg total) by mouth 2 (two) times daily before a meal. 30 capsule 3  . ondansetron (ZOFRAN) 8 MG tablet One pill every 8 hours as needed for nausea/vomitting. 60 tablet 1  . ondansetron (ZOFRAN-ODT) 4 MG disintegrating tablet Take 4 mg by mouth every 8 (eight) hours as needed for nausea or vomiting.     . Pitavastatin Calcium (LIVALO) 4 MG TABS Take 4 mg by mouth daily. (Patient not taking: Reported on 10/11/2019)    . prochlorperazine (COMPAZINE) 10 MG tablet Take 1 tablet (10 mg total) by mouth every 6 (six) hours as needed for nausea or vomiting. 40 tablet 1  . traMADol (ULTRAM) 50 MG tablet Take 1 tablet (50 mg total) by mouth every 6 (six) hours as needed for severe pain. 15 tablet 0   No current facility-administered medications for this visit.   Review of Systems General:  Fatigue, weakness Skin: no complaints Eyes: no complaints HEENT: no  complaints Breasts: no complaints Pulmonary: shortness of breath Cardiac: no complaints Gastrointestinal: abdominal pain, bloating, decreased appetite, nausea, constipation (1-2 days after chemo then resolves) Genitourinary/Sexual: no complaints Ob/Gyn: no complaints Musculoskeletal: no complaints Hematology: no complaints Neurologic/Psych: depression   Objective:  Physical Examination:  BP 133/83   Pulse 92   Temp 98 F (36.7 C)   Resp 20   Wt 154 lb 9.6 oz (70.1 kg)   SpO2 100%   BMI 26.54 kg/m    ECOG Performance Status: 1 - Symptomatic but completely ambulatory  GENERAL: Patient is a well appearing female in no acute distress HEENT:  PERRL, neck supple  NODES:  No cervical, supraclavicular, axillary, or  inguinal lymphadenopathy palpated.  LUNGS:  Clear to auscultation bilaterally.  No wheezes or rhonchi. HEART:  Regular rate and rhythm. No murmur appreciated. ABDOMEN:  Soft, nontender.  Rounded abdomen. No obvious mass. No hepatosplenomegaly. Midline surgical scar well healed.  MSK:  No focal spinal tenderness to palpation. Ambulatory.  EXTREMITIES:  No peripheral edema.   SKIN:  Clear with no obvious rashes or skin changes.  NEURO:  Nonfocal. Well oriented.  Appropriate affect.  Pelvic: exam chaperoned by RN and CMA.  BME: Uterus: not grossly enlarged but limited exam due to ascites. BME: firm plaque like midline mass in the cul-de-sac measuring ~3 cm and firm. Positive 1 cm right parametrial nodule; left parametrium negative. Cervix- anterior cervical hood noted, no gross lesions seen.  Rectovaginal exam: confirmatory. Doesn't seem that mass is extending into rectal wall.     Lab Review CBC Latest Ref Rng & Units 11/07/2019 10/26/2019 10/15/2019  WBC 4.0 - 10.5 K/uL 1.9(L) 3.8(L) 2.3(L)  Hemoglobin 12.0 - 15.0 g/dL 12.1 13.5 12.5  Hematocrit 36 - 46 % 35.6(L) 40.4 36.1  Platelets 150 - 400 K/uL 153 159 190  ANC 800 (11/07/19)    Chemistry      Component Value  Date/Time   NA 140 11/07/2019 1329   K 4.3 11/07/2019 1329   CL 108 11/07/2019 1329   CO2 26 11/07/2019 1329   BUN 14 11/07/2019 1329   CREATININE 0.97 11/07/2019 1329      Component Value Date/Time   CALCIUM 9.5 11/07/2019 1329   ALKPHOS 88 10/26/2019 0814   AST 44 (H) 10/26/2019 0814   ALT 42 10/26/2019 0814   BILITOT 0.6 10/26/2019 0814       Radiologic Imaging: CT imaging from 11/06/19 was independently reviewed by Dr. Theora Gianotti who agrees with findings as detailed below.         Assessment:  Jeanette Allen is a 64 y.o. 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 PET imaging concerning for endometrial primary. EMBx could not be performed due to cervical stenosis. S/p three cycles paclitaxel/carboplatin with evidence of response based in CA125 and symptoms.   Hepatitis C, elevated AST mild, normal bilirubin and albumin.   Medical co-morbidities complicating care: HTN and prior abdominal surgery and Hepatitis C.  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 Rogue Bussing for cycle #4. Plan for surgery at [facility] 12/13/2019. Based on exam and imaging will probably need rectosigmoid resection and diaphragmatic stripping. Will provide preoperative counseling at her [place] visit. Send tumor for HRD testing as this was not assessed using the OmniSeq assay.   Hepatitis C. Given surgery is an option we will coordinate care with GI. Mariea Clonts will contact the GI team for perioperative recommendations. Continue to follow LFTs.   Beckey Rutter, DNP, AGNP-C [place] at [facility] [phone] (clinic)  I personally had a face to face interaction and evaluated the patient jointly with the NP, Ms. Beckey Rutter.  I have reviewed her history and available records and have performed the key portions of the  physical exam including lymph node survey, abdominal exam, pelvic exam with my findings confirming those documented above by the APP.  I have discussed the case with the APP and the patient.  I agree with the above documentation, assessment and plan which was fully formulated by  me.  Counseling was completed by me.   I personally saw the patient and performed a substantive portion of this encounter in conjunction with the listed APP as documented above.  Mischell Branford Gaetana Michaelis, MD

## 2019-11-07 NOTE — Telephone Encounter (Signed)
Called edited report  ADDENDUM REPORT: 11/06/2019 16:53  ADDENDUM: Mild distension of distal small bowel in the setting of peritoneal disease with mild angulation of some small bowel loops. This constellation of findings could be seen in early malignant adhesions, attention on follow-up and correlate with any symptoms of intermittent abdominal pain.  These results will be called to the ordering clinician or representative by the Radiologist Assistant, and communication documented in the PACS or [employer].   Electronically Signed   By: Zetta Bills M.D.   On: 11/06/2019 16:53

## 2019-11-08 ENCOUNTER — Ambulatory Visit (INDEPENDENT_AMBULATORY_CARE_PROVIDER_SITE_OTHER): Payer: Medicare Other | Admitting: Gastroenterology

## 2019-11-08 ENCOUNTER — Encounter: Payer: Self-pay | Admitting: Gastroenterology

## 2019-11-08 VITALS — BP 146/71 | HR 94 | Temp 99.0°F | Ht 64.0 in | Wt 154.1 lb

## 2019-11-08 DIAGNOSIS — B182 Chronic viral hepatitis C: Secondary | ICD-10-CM | POA: Diagnosis not present

## 2019-11-08 DIAGNOSIS — K2210 Ulcer of esophagus without bleeding: Secondary | ICD-10-CM | POA: Diagnosis not present

## 2019-11-08 NOTE — Progress Notes (Signed)
Jeanette Darby, MD [address]  [place], [place] [address]  Main: [phone]  Fax: [phone]    Gastroenterology Consultation  Referring Provider:     Danelle Berry, NP Primary Care Physician:  Jeanette Berry, NP Primary Gastroenterologist:  Dr. Cephas Allen Reason for Consultation:   Chronic Hepatitis C        HPI:   Jeanette Allen is a 64 y.o. female referred by Dr. Danelle Berry, NP  for consultation & management of hep C, cirrhosis.  Patient is newly diagnosed with hepatitis C.  She was urgently seen by her PCP in April 2021 secondary to abdominal distention, fatigue.  She underwent ultrasound abdomen which revealed multiple peripancreatic and perihepatic hypoechoic nodules measuring up to 2 cm, ascites and cirrhosis.  Her HCV antibody was positive, HCV RNA quantitative was positive.  She did have mildly elevated AST and ALT.  Normal CBC, TSH, HbA1c. Subsequently, she underwent CT scan which revealed severe ascites and retroperitoneal masses as well as was gas around the gallbladder concerning for possible cholecystitis.  Patient was evaluated by Dr. Bary Allen on 08/24/2019, checked for CEA, AFP, CA 19-9 which was normal, CA-125 was 1228. She underwent therapeutic paracentesis on 08/27/2019 with fluid analysis which confirmed high-grade serous carcinoma on cytology.  Total protein, albumin levels were normal.  There was no evidence of SBP.  Fluid LDH was elevated  Patient reports mild abdominal discomfort, in lower abdomen, diffuse abdominal discomfort, nausea She denies constipation, rectal bleeding, melena  Patient is referred to GI for management of hep C  She has an appointment with oncologist, Dr. Rogue Allen tomorrow Patient does not smoke or drink alcohol She reports she received a blood transfusion when she had severe bleeding from ectopic pregnancy in 1990  Follow-up visit 11/08/2019 Patient is diagnosed with primary gynecologic malignancy,  undergoing neoadjuvant chemo, currently planned to undergo surgery in December.  Patient has history of chronic hepatitis C, genotype 1b treatment nave.  With no evidence of esophageal varices or cirrhosis.  Patient has been doing very well with chemo, her tumor burden has decreased, ascites has also improved.  Patient reports feeling nauseous in the morning which is likely secondary to chemo.  Her appetite is intact, she denies any abdominal pain, rectal bleeding.  She does not have any other GI symptoms today.  NSAIDs: None  Antiplts/Anticoagulants/Anti thrombotics: None  GI Procedures: Reports having had a colonoscopy around age 82, reportedly normal EGD and colonoscopy 09/2019  - Rule out malignancy, duodenal mass. Biopsied. - Normal second portion of the duodenum. - Erosive gastropathy with no bleeding and no stigmata of recent bleeding. - LA Grade C reflux esophagitis with no bleeding. - Esophagogastric landmarks identified. - Normal esophagus.  - Preparation of the colon was poor. - Severe diverticulosis in the entire examined colon. There was narrowing of the colon in association with the diverticular opening. Peri-diverticular erythema was seen. - Two 6 to 10 mm polyps in the transverse colon, removed with a cold snare. Resected and retrieved. Clips (MR conditional) were placed. - Non-bleeding external hemorrhoids. DIAGNOSIS:  A. DUODENAL BULB; COLD BIOPSY:  - DUODENAL MUCOSA DISPLAYING NORMAL VILLOUS ARCHITECTURE AND MILD  BRUNNER GLAND HYPERPLASIA.  - NEGATIVE FOR FEATURES OF CELIAC, DYSPLASIA, AND MALIGNANCY.   DIAGNOSIS:  A. COLON POLYP, TRANSVERSE; COLD SNARE:  - SESSILE SERRATED POLYP, MULTIPLE FRAGMENTS.  - NEGATIVE FOR DYSPLASIA AND MALIGNANCY.   Past Medical History:  Diagnosis Date   Arthritis  Cancer [facility])    Hypertension     Past Surgical History:  Procedure Laterality Date   APPENDECTOMY     COLONOSCOPY WITH PROPOFOL N/A 10/01/2019   Procedure:  COLONOSCOPY WITH PROPOFOL;  Surgeon: Jeanette Landsman, MD;  Location: [facility] ENDOSCOPY;  Service: Gastroenterology;  Laterality: N/A;   ESOPHAGOGASTRODUODENOSCOPY (EGD) WITH PROPOFOL N/A 09/12/2019   Procedure: ESOPHAGOGASTRODUODENOSCOPY (EGD) WITH PROPOFOL;  Surgeon: Jeanette Landsman, MD;  Location: [facility] ENDOSCOPY;  Service: Gastroenterology;  Laterality: N/A;    Current Outpatient Medications:    ergocalciferol (VITAMIN D2) 1.25 MG (50000 UT) capsule, Take by mouth., Disp: , Rfl:    FLUoxetine (PROZAC) 20 MG capsule, Take by mouth., Disp: , Rfl:    LORazepam (ATIVAN) 0.5 MG tablet, Take 1-2 pill at night for anxiety/nausea., Disp: 60 tablet, Rfl: 0   losartan (COZAAR) 50 MG tablet, Take by mouth., Disp: , Rfl:    omeprazole (PRILOSEC) 40 MG capsule, Take 1 capsule (40 mg total) by mouth 2 (two) times daily before a meal., Disp: 30 capsule, Rfl: 3   ondansetron (ZOFRAN) 8 MG tablet, One pill every 8 hours as needed for nausea/vomitting., Disp: 60 tablet, Rfl: 1   ondansetron (ZOFRAN-ODT) 4 MG disintegrating tablet, Take 4 mg by mouth every 8 (eight) hours as needed for nausea or vomiting. , Disp: , Rfl:    prochlorperazine (COMPAZINE) 10 MG tablet, Take 1 tablet (10 mg total) by mouth every 6 (six) hours as needed for nausea or vomiting., Disp: 40 tablet, Rfl: 1   traMADol (ULTRAM) 50 MG tablet, Take 1 tablet (50 mg total) by mouth every 6 (six) hours as needed for severe pain., Disp: 15 tablet, Rfl: 0   Pitavastatin Calcium (LIVALO) 4 MG TABS, Take 4 mg by mouth daily. (Patient not taking: Reported on 10/11/2019), Disp: , Rfl:    Family History  Problem Relation Age of Onset   Diabetes Maternal Grandmother    Hypertension Maternal Grandmother    Stroke Maternal Grandmother      Social History   Tobacco Use   Smoking status: Current Every Day Smoker   Smokeless tobacco: Never Used  Vaping Use   Vaping Use: Never used  Substance Use Topics   Alcohol use: Not  Currently   Drug use: Never    Allergies as of 11/08/2019   (No Known Allergies)    Review of Systems:    All systems reviewed and negative except where noted in HPI.   Physical Exam:  BP (!) 146/71 (BP Location: Left Arm, Patient Position: Sitting, Cuff Size: Normal)    Pulse 94    Temp 99 F (37.2 C) (Oral)    Ht 5\' 4"  (1.626 m)    Wt 154 lb 2 oz (69.9 kg)    BMI 26.46 kg/m  No LMP recorded. Patient is postmenopausal.  General:   Alert,  Well-developed, well-nourished, pleasant and cooperative in NAD Head:  Normocephalic and atraumatic. Eyes:  Sclera clear, no icterus.   Conjunctiva pink. Ears:  Normal auditory acuity. Nose:  No deformity, discharge, or lesions. Mouth:  No deformity or lesions,oropharynx pink & moist. Neck:  Supple; no masses or thyromegaly. Lungs:  Respirations even and unlabored.  Clear throughout to auscultation.   No wheezes, crackles, or rhonchi. No acute distress. Heart:  Regular rate and rhythm; no murmurs, clicks, rubs, or gallops. Abdomen:  Normal bowel sounds. Soft, nontender, grossly distended, tympanic to percussion, without masses, hepatosplenomegaly or hernias noted.  No guarding or rebound tenderness.   Rectal:  Not performed Msk:  Symmetrical without gross deformities. Good, equal movement & strength bilaterally. Pulses:  Normal pulses noted. Extremities:  No clubbing or edema.  No cyanosis. Neurologic:  Alert and oriented x3;  grossly normal neurologically. Skin:  Intact without significant lesions or rashes. No jaundice. Psych:  Alert and cooperative. Normal mood and affect.  Imaging Studies: Reviewed  Assessment and Plan:   GLENDENE WYER is a 64 y.o. pleasant Caucasian female with chronic hepatitis C, genotype 1b, treatment nave hep C, no evidence of cirrhosis, high-grade metastatic serous carcinoma of gynecologic origin currently on chemotherapy  Chronic hepatitis C, genotype 1b, treatment nave Patient does not have biochemical,  endoscopic or imaging evidence of cirrhosis Treatment for chronic hep C involves 8 weeks therapy of oral medication which should be uninterrupted in order to prevent resistance Therefore, recommend treatment after patient undergoes surgery.  There is no contraindication to surgery from GI standpoint other than the surgical staff to be extra careful during surgery to avoid occupational exposure to hepatitis C  Erosive esophagitis Continue omeprazole 40 mg, can be decreased to once a day Advised to increase to 2 times a day during perioperative period   Follow up in 3 months   Jeanette Darby, MD

## 2019-11-16 ENCOUNTER — Inpatient Hospital Stay: Payer: Medicare Other

## 2019-11-16 ENCOUNTER — Other Ambulatory Visit: Payer: Self-pay | Admitting: Nurse Practitioner

## 2019-11-16 ENCOUNTER — Encounter: Payer: Self-pay | Admitting: Internal Medicine

## 2019-11-16 ENCOUNTER — Telehealth: Payer: Self-pay | Admitting: Nurse Practitioner

## 2019-11-16 ENCOUNTER — Inpatient Hospital Stay (HOSPITAL_BASED_OUTPATIENT_CLINIC_OR_DEPARTMENT_OTHER): Payer: Medicare Other | Admitting: Internal Medicine

## 2019-11-16 ENCOUNTER — Other Ambulatory Visit: Payer: Self-pay

## 2019-11-16 VITALS — BP 150/87 | HR 91 | Resp 20

## 2019-11-16 DIAGNOSIS — C541 Malignant neoplasm of endometrium: Secondary | ICD-10-CM

## 2019-11-16 DIAGNOSIS — C786 Secondary malignant neoplasm of retroperitoneum and peritoneum: Secondary | ICD-10-CM | POA: Diagnosis not present

## 2019-11-16 DIAGNOSIS — Z7189 Other specified counseling: Secondary | ICD-10-CM

## 2019-11-16 DIAGNOSIS — M25561 Pain in right knee: Secondary | ICD-10-CM

## 2019-11-16 LAB — CBC WITH DIFFERENTIAL/PLATELET
Abs Immature Granulocytes: 0.01 K/uL (ref 0.00–0.07)
Basophils Absolute: 0 K/uL (ref 0.0–0.1)
Basophils Relative: 1 %
Eosinophils Absolute: 0 K/uL (ref 0.0–0.5)
Eosinophils Relative: 1 %
HCT: 37.1 % (ref 36.0–46.0)
Hemoglobin: 12.6 g/dL (ref 12.0–15.0)
Immature Granulocytes: 0 %
Lymphocytes Relative: 30 %
Lymphs Abs: 1 K/uL (ref 0.7–4.0)
MCH: 32.4 pg (ref 26.0–34.0)
MCHC: 34 g/dL (ref 30.0–36.0)
MCV: 95.4 fL (ref 80.0–100.0)
Monocytes Absolute: 0.4 K/uL (ref 0.1–1.0)
Monocytes Relative: 12 %
Neutro Abs: 2 K/uL (ref 1.7–7.7)
Neutrophils Relative %: 56 %
Platelets: 131 K/uL — ABNORMAL LOW (ref 150–400)
RBC: 3.89 MIL/uL (ref 3.87–5.11)
RDW: 17 % — ABNORMAL HIGH (ref 11.5–15.5)
WBC: 3.5 K/uL — ABNORMAL LOW (ref 4.0–10.5)
nRBC: 0 % (ref 0.0–0.2)

## 2019-11-16 LAB — COMPREHENSIVE METABOLIC PANEL WITH GFR
ALT: 47 U/L — ABNORMAL HIGH (ref 0–44)
AST: 48 U/L — ABNORMAL HIGH (ref 15–41)
Albumin: 3.9 g/dL (ref 3.5–5.0)
Alkaline Phosphatase: 93 U/L (ref 38–126)
Anion gap: 7 (ref 5–15)
BUN: 12 mg/dL (ref 8–23)
CO2: 25 mmol/L (ref 22–32)
Calcium: 9.3 mg/dL (ref 8.9–10.3)
Chloride: 106 mmol/L (ref 98–111)
Creatinine, Ser: 0.8 mg/dL (ref 0.44–1.00)
GFR, Estimated: >60 mL/min
Glucose, Bld: 121 mg/dL — ABNORMAL HIGH (ref 70–99)
Potassium: 4.2 mmol/L (ref 3.5–5.1)
Sodium: 138 mmol/L (ref 135–145)
Total Bilirubin: 0.5 mg/dL (ref 0.3–1.2)
Total Protein: 7.9 g/dL (ref 6.5–8.1)

## 2019-11-16 MED ORDER — DIPHENHYDRAMINE HCL 50 MG/ML IJ SOLN
50.0000 mg | Freq: Once | INTRAMUSCULAR | Status: AC
  Administered 2019-11-16: 50 mg via INTRAVENOUS
  Filled 2019-11-16: qty 1

## 2019-11-16 MED ORDER — SODIUM CHLORIDE 0.9 % IV SOLN
Freq: Once | INTRAVENOUS | Status: AC
  Filled 2019-11-16: qty 250

## 2019-11-16 MED ORDER — PREDNISONE 5 MG PO TABS: 5.0000 mg | ORAL_TABLET | Freq: Every day | ORAL | 0 refills | Status: DC

## 2019-11-16 MED ORDER — PALONOSETRON HCL INJECTION 0.25 MG/5ML
0.2500 mg | Freq: Once | INTRAVENOUS | Status: AC
  Administered 2019-11-16: 0.25 mg via INTRAVENOUS
  Filled 2019-11-16: qty 5

## 2019-11-16 MED ORDER — SODIUM CHLORIDE 0.9 % IV SOLN
542.4000 mg | Freq: Once | INTRAVENOUS | Status: AC
  Administered 2019-11-16: 540 mg via INTRAVENOUS
  Filled 2019-11-16: qty 54

## 2019-11-16 MED ORDER — SODIUM CHLORIDE 0.9 % IV SOLN
150.0000 mg | Freq: Once | INTRAVENOUS | Status: AC
  Administered 2019-11-16: 150 mg via INTRAVENOUS
  Filled 2019-11-16: qty 5

## 2019-11-16 MED ORDER — FAMOTIDINE IN NACL 20-0.9 MG/50ML-% IV SOLN
20.0000 mg | Freq: Once | INTRAVENOUS | Status: AC
  Administered 2019-11-16: 20 mg via INTRAVENOUS
  Filled 2019-11-16: qty 50

## 2019-11-16 MED ORDER — SODIUM CHLORIDE 0.9 % IV SOLN
10.0000 mg | Freq: Once | INTRAVENOUS | Status: AC
  Administered 2019-11-16: 10 mg via INTRAVENOUS
  Filled 2019-11-16: qty 10

## 2019-11-16 MED ORDER — LORAZEPAM 0.5 MG PO TABS
0.5000 mg | ORAL_TABLET | Freq: Once | ORAL | Status: AC
  Administered 2019-11-16: 0.5 mg via ORAL
  Filled 2019-11-16: qty 1

## 2019-11-16 MED ORDER — SODIUM CHLORIDE 0.9 % IV SOLN
175.0000 mg/m2 | Freq: Once | INTRAVENOUS | Status: AC
  Administered 2019-11-16: 318 mg via INTRAVENOUS
  Filled 2019-11-16: qty 53

## 2019-11-16 NOTE — Progress Notes (Signed)
1506- Patient tolerated treatment well. Patient discharged to home at this time.

## 2019-11-16 NOTE — Telephone Encounter (Signed)
Calling patient to follow up on medication request for leg and knee pain. Unclear if tramadol, tylenol, or steroids which I had previously prescribed for her. No answer. Left VM.

## 2019-11-16 NOTE — Progress Notes (Signed)
Pt expressed extreme fatigue this morning. States she has been feeling weakness in her legs and arm. Also has a bump that she would like evaluated on the left side of her neck.

## 2019-11-16 NOTE — Assessment & Plan Note (Signed)
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  CEA- improving.  Currently on carbotaxol chemotherapy.   #Proceed with cycle #4 of carbotaxol. Labs today reviewed;  acceptable for treatment today.  S/p evaluation with Dr. Theora Gianotti awaiting surgery on December 9 at [facility].  Discussed with gyn-Onc..Nov 23rd- pre-op [facility].   # Myalgias-likely secondary to Taxol.  Previously improved with ?  Steroids vs tramadol.  Will check with nurse practitioner  # Nausea- Anxiety- STABLE. continue ativan prn.  # DISPOSITION: # carbo-taxol today # Follow up on dec 2nd or 3rd- MD; labs- CBC/CMP/ca-125; NO chemo- Dr.B

## 2019-11-16 NOTE — Progress Notes (Signed)
[place] CONSULT NOTE  Patient Care Team: Danelle Berry, NP as PCP - General (Nurse Practitioner) Clent Jacks, RN as Oncology Nurse Navigator  CHIEF COMPLAINTS/PURPOSE OF CONSULTATION: high grade serous cancer   #  Oncology History Overview Note  #August 2021-ascitic fluid cytology-high-grade serous carcinoma of gynecologic origin; CT scan-behind the abdominal wall 4.6 x 2.1 mass ; within the mesentery 6.7 x 4.5 cm left of mid abdomen . BIOPSY of the peritoneal mass; high grade carcinoma [phone] [Dr.Byrnett]; SEP 9th 2021- PET scan-shows peritoneal carcinomatosis; uterine uptake [Dr.Secord; unable to biopsy cervical stenosis] sigmoid colonic uptake concerning for malignancy.  No ovarian uptake.  CEA 125 +1300; CEA-2.    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     # PALLIATIVE CARE EVALUATION:  # PAIN MANAGEMENT:    DIAGNOSIS: Uterine cancer  STAGE:   IV      ;  GOALS: control  CURRENT/MOST RECENT THERAPY : Carbotaxol    Uterine cancer ([place]) (Resolved)  09/04/2019 Initial Diagnosis   Gynecologic cancer [facility])   Endometrial cancer ([place])  09/14/2019 -  Chemotherapy   The patient had dexamethasone (DECADRON) 4 MG tablet, 8 mg, Oral, Daily, 1 of 1 cycle, Start date: --, End date: -- palonosetron (ALOXI) injection 0.25 mg, 0.25 mg, Intravenous,  Once, 3 of 6 cycles Administration: 0.25 mg (09/14/2019), 0.25 mg (10/05/2019), 0.25 mg (10/26/2019) CARBOplatin (PARAPLATIN) 580 mg in sodium chloride 0.9 % 250 mL chemo infusion, 580 mg (100 % of original dose 577.8 mg), Intravenous,  Once, 3 of 6 cycles Dose modification:   (original dose 577.8 mg, Cycle 1) Administration: 580 mg (09/14/2019), 540 mg (10/05/2019), 540 mg (10/26/2019) fosaprepitant (EMEND) 150 mg in  sodium chloride 0.9 % 145 mL IVPB, 150 mg, Intravenous,  Once, 3 of 6 cycles Administration: 150 mg (09/14/2019), 150 mg (10/05/2019), 150 mg (10/26/2019) PACLitaxel (TAXOL) 318 mg in sodium chloride 0.9 % 500 mL chemo infusion (> 34m/m2), 175 mg/m2 = 318 mg, Intravenous,  Once, 3 of 6 cycles Administration: 318 mg (09/14/2019), 318 mg (10/05/2019), 318 mg (10/26/2019)  for chemotherapy treatment.    10/05/2019 Initial Diagnosis   Endometrial cancer (HCC)    Genetic Testing   Negative germline genetic testing. No pathogenic variants identified on the [facility]. The report date is 10/01/2019. HRD testing ([employer]) still pending.   The [facility] panel offered by [employer] sequencing and deletion/duplication testing of the following 35 genes: APC, ATM, AXIN2, BARD1, BMPR1A, BRCA1, BRCA2, BRIP1, CHD1, CDK4, CDKN2A, CHEK2, EPCAM (large rearrangement only), HOXB13, GALNT12, MLH1, MSH2, MSH3, MSH6, MUTYH, NBN, NTHL1, PALB2, PMS2, PTEN, RAD51C, RAD51D, RNF43, RPS20, SMAD4, STK11, and TP53. Sequencing was performed for select regions of POLE and POLD1, and large rearrangement analysis was performed for select regions of GREM1.       HISTORY OF PRESENTING ILLNESS:  Jeanette Allen 64y.o.  female with recently diagnosed high-grade serous carcinoma likely uterine currently on carbotaxol chemotherapy is here for follow-up/review results of the CT scan.  Patient status post cycle #3 of chemotherapy.  In the interim patient has been evaluated by gynecology oncology for surgery.  Complains of fatigue.  Intermittent nausea but no vomiting.  No chest pain or shortness of breath or cough.  Complains of muscle pain in the legs.  No swelling.   Review of Systems  Constitutional: Positive for malaise/fatigue. Negative  for chills, diaphoresis, fever and weight loss.  HENT: Negative for nosebleeds and sore throat.   Eyes: Negative for double vision.  Respiratory: Negative  for cough, hemoptysis, sputum production, shortness of breath and wheezing.   Cardiovascular: Negative for chest pain, palpitations, orthopnea and leg swelling.  Gastrointestinal: Positive for abdominal pain and nausea. Negative for blood in stool, constipation, diarrhea, heartburn, melena and vomiting.  Genitourinary: Negative for dysuria, frequency and urgency.  Musculoskeletal: Positive for myalgias. Negative for back pain and joint pain.  Skin: Negative.  Negative for itching and rash.  Neurological: Negative for dizziness, tingling, focal weakness, weakness and headaches.  Endo/Heme/Allergies: Does not bruise/bleed easily.  Psychiatric/Behavioral: Negative for depression. The patient is not nervous/anxious and does not have insomnia.      MEDICAL HISTORY:  Past Medical History:  Diagnosis Date  . Arthritis   . Cancer ([place])   . Hypertension     SURGICAL HISTORY: Past Surgical History:  Procedure Laterality Date  . APPENDECTOMY    . COLONOSCOPY WITH PROPOFOL N/A 10/01/2019   Procedure: COLONOSCOPY WITH PROPOFOL;  Surgeon: Lin Landsman, MD;  Location: [facility] ENDOSCOPY;  Service: Gastroenterology;  Laterality: N/A;  . ESOPHAGOGASTRODUODENOSCOPY (EGD) WITH PROPOFOL N/A 09/12/2019   Procedure: ESOPHAGOGASTRODUODENOSCOPY (EGD) WITH PROPOFOL;  Surgeon: Lin Landsman, MD;  Location: [facility] ENDOSCOPY;  Service: Gastroenterology;  Laterality: N/A;    SOCIAL HISTORY: Social History   Socioeconomic History  . Marital status: Married    Spouse name: Not on file  . Number of children: Not on file  . Years of education: Not on file  . Highest education level: Not on file  Occupational History  . Not on file  Tobacco Use  . Smoking status: Current Every Day Smoker  . Smokeless tobacco: Never Used  Vaping Use  . Vaping Use: Never used  Substance and Sexual Activity  . Alcohol use: Not Currently  . Drug use: Never  . Sexual activity: Yes  Other Topics Concern  . Not on file   Social History Narrative   Lives with husband; near [place]. 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 Difficulty of Paying Living Expenses: Not on file  Food Insecurity:   . Worried About Charity fundraiser in the Last Year: Not on file  . Ran Out of Food in the Last Year: Not on file  Transportation Needs:   . Lack of Transportation (Medical): Not on file  . Lack of Transportation (Non-Medical): Not on file  Physical Activity:   . Days of Exercise per Week: Not on file  . Minutes of Exercise per Session: Not on file  Stress:   . Feeling of Stress : Not on file  Social Connections:   . Frequency of Communication with Friends and Family: Not on file  . Frequency of Social Gatherings with Friends and Family: Not on file  . Attends Religious Services: Not on file  . Active Member of Clubs or Organizations: Not on file  . Attends Archivist Meetings: Not on file  . Marital Status: Not on file  Intimate Partner Violence:   . Fear of Current or Ex-Partner: Not on file  . Emotionally Abused: Not on file  . Physically Abused: Not on file  . Sexually Abused: Not on file    FAMILY HISTORY: Family History  Problem Relation Age of Onset  .  Diabetes Maternal Grandmother   . Hypertension Maternal Grandmother   . Stroke Maternal Grandmother     ALLERGIES:  has No Known Allergies.  MEDICATIONS:  Current Outpatient Medications  Medication Sig Dispense Refill  . ergocalciferol (VITAMIN D2) 1.25 MG (50000 UT) capsule Take by mouth.    Marland Kitchen FLUoxetine (PROZAC) 20 MG capsule Take by mouth.    Marland Kitchen LORazepam (ATIVAN) 0.5 MG tablet Take 1-2 pill at night for anxiety/nausea. 60 tablet 0  . losartan (COZAAR) 50 MG tablet Take by mouth.    Marland Kitchen omeprazole (PRILOSEC) 40 MG capsule Take 1 capsule (40 mg total) by mouth 2 (two) times daily before a meal. 30  capsule 3  . ondansetron (ZOFRAN) 8 MG tablet One pill every 8 hours as needed for nausea/vomitting. 60 tablet 1  . ondansetron (ZOFRAN-ODT) 4 MG disintegrating tablet Take 4 mg by mouth every 8 (eight) hours as needed for nausea or vomiting.     . prochlorperazine (COMPAZINE) 10 MG tablet Take 1 tablet (10 mg total) by mouth every 6 (six) hours as needed for nausea or vomiting. 40 tablet 1  . traMADol (ULTRAM) 50 MG tablet Take 1 tablet (50 mg total) by mouth every 6 (six) hours as needed for severe pain. 15 tablet 0  . Pitavastatin Calcium (LIVALO) 4 MG TABS Take 4 mg by mouth daily. (Patient not taking: Reported on 10/11/2019)     No current facility-administered medications for this visit.      Marland Kitchen  PHYSICAL EXAMINATION: ECOG PERFORMANCE STATUS: 1 - Symptomatic but completely ambulatory  Vitals:   11/16/19 0832  BP: (!) 147/83  Pulse: 80  Resp: 16  Temp: 97.8 F (36.6 C)  SpO2: 100%   Filed Weights   11/16/19 0832  Weight: 152 lb 12.8 oz (69.3 kg)    Physical Exam Constitutional:      Comments: Ambulating independently.  Accompanied by husband.  HENT:     Head: Normocephalic and atraumatic.     Mouth/Throat:     Pharynx: No oropharyngeal exudate.  Eyes:     Pupils: Pupils are equal, round, and reactive to light.  Cardiovascular:     Rate and Rhythm: Normal rate and regular rhythm.  Pulmonary:     Effort: Pulmonary effort is normal. No respiratory distress.     Breath sounds: Normal breath sounds. No wheezing.  Abdominal:     General: Bowel sounds are normal. There is distension.     Palpations: Abdomen is soft. There is no mass.     Tenderness: There is no abdominal tenderness. There is no guarding or rebound.  Musculoskeletal:        General: No tenderness. Normal range of motion.     Cervical back: Normal range of motion and neck supple.  Skin:    General: Skin is warm.  Neurological:     Mental Status: She is alert and oriented to person, place, and time.   Psychiatric:        Mood and Affect: Affect normal.      LABORATORY DATA:  I have reviewed the data as listed Lab Results  Component Value Date   WBC 3.5 (L) 11/16/2019   HGB 12.6 11/16/2019   HCT 37.1 11/16/2019   MCV 95.4 11/16/2019   PLT 131 (L) 11/16/2019   Recent Labs    09/14/19 0813 09/14/19 0813 09/28/19 1006 10/05/19 0807 10/15/19 1107 10/26/19 0814 11/07/19 1329 11/16/19 0803  NA 139   < > 138 140   < >  138 140 138  K 4.0   < > 4.0 4.3   < > 4.2 4.3 4.2  CL 104   < > 104 104   < > 105 108 106  CO2 24   < > 26 27   < > _0 GLUCOSE 104*   < > 106* 108*   < > 125* 84 121*  BUN 12   < > 14 13   < > _1 CREATININE 0.93   < > 0.73 0.86   < > 0.82 0.97 0.80  CALCIUM 9.2   < > 9.0 9.9   < > 9.6 9.5 9.3  GFRNONAA >60   < > >60 >60   < > >60 >60 >60  GFRAA >60  --  >60 >60  --   --   --   --   PROT 7.5   < >  --  7.6  --  8.0  --  7.9  ALBUMIN 3.5   < >  --  4.0  --  4.2  --  3.9  AST 30   < >  --  37  --  44*  --  48*  ALT 21   < >  --  30  --  42  --  47*  ALKPHOS 71   < >  --  72  --  88  --  93  BILITOT 0.5   < >  --  0.5  --  0.6  --  0.5   < > = values in this interval not displayed.    RADIOGRAPHIC STUDIES: I have personally reviewed the radiological images as listed and agreed with the findings in the report. CT CHEST ABDOMEN PELVIS W CONTRAST  Addendum Date: 11/06/2019   ADDENDUM REPORT: 11/06/2019 16:53 ADDENDUM: Mild distension of distal small bowel in the setting of peritoneal disease with mild angulation of some small bowel loops. This constellation of findings could be seen in early malignant adhesions, attention on follow-up and correlate with any symptoms of intermittent abdominal pain. These results will be called to the ordering clinician or representative by the Radiologist Assistant, and communication documented in the PACS or [employer]. Electronically Signed   By: Zetta Bills M.D.   On: 11/06/2019 16:53   Result Date:  11/06/2019 CLINICAL DATA:  Uterine/cervical cancer, assess treatment response in this 64 year old female. EXAM: CT CHEST, ABDOMEN, AND PELVIS WITH CONTRAST TECHNIQUE: Multidetector CT imaging of the chest, abdomen and pelvis was performed following the standard protocol during bolus administration of intravenous contrast. CONTRAST:  164m OMNIPAQUE IOHEXOL 300 MG/ML  SOLN COMPARISON:  09/13/2019 FINDINGS: CT CHEST FINDINGS Cardiovascular: Calcified atheromatous plaque noncalcified plaque in the thoracic aorta. Normal appearance of central pulmonary vasculature venous phase. Normal heart size. Negative for pericardial effusion. Mediastinum/Nodes: Thoracic inlet structures are normal. No axillary lymphadenopathy. No mediastinal adenopathy. No hilar adenopathy. Esophagus grossly normal. Lungs/Pleura: Airways are patent. Lungs are clear with resolution of LEFT-sided pleural effusion since the previous exam. Musculoskeletal: See below for full musculoskeletal detail. CT ABDOMEN PELVIS FINDINGS Hepatobiliary: Lobular hepatic contours. No focal intrahepatic lesion. Ascites about the liver is diminished since the previous study. Disease along the under surface of the RIGHT hemidiaphragm just above the LEFT hemi liver measures approximately 3.9 x 1.2 cm, difficult to measure given plain of scan and appearance on previous noncontrast PET where it measured approximately 5.4 x 2.0 cm. Portal vein is patent. No pericholecystic stranding. No biliary  duct dilation. Pancreas: Normal Spleen: Small amount of perisplenic ascites diminished from the previous exam. No mass in the spleen. Adrenals/Urinary Tract: Adrenal glands are normal. Symmetric renal enhancement. No hydronephrosis. Urinary bladder is normal. Stomach/Bowel: Signs of serosal and mesenteric disease, see below. No overt bowel obstruction. Mild distension of ileal loops with mild angulation distal ileal loops seen on image 98 of series 2. Stool like material in the  terminal ileum. Colonic thickening in the setting of diverticular disease and presumed serosal disease at involving the sigmoid colon with similar appearance compared to the prior PET exam thickening may be slightly improved subjectively since the previous study. Also compared with study of August of 2021. Vascular/Lymphatic: Calcified and noncalcified atheromatous plaque in the abdominal aorta. No aneurysmal dilation. Periportal lymph nodes with some enlargement, largest 2.2 cm, previously 2.4 cm in August 21. Hepatogastric lymph nodes have nearly completely resolved measuring less than a cm in. The lymph nodes are implants in this location on the study of August of 2020 measured up to 13 mm. Soft tissue and or lymph node adjacent to the duodenum (image 73, series 2) 1.4 cm previously 2.0 cm short axis. Scattered smaller lymph nodes are similar. No new adenopathy Reproductive: Ascites about reproductive structures. No discrete pelvic mass lesion. Heterogeneity of the uterus. Diminished ascites also in the pelvis when compared to the prior exam. No pelvic lymphadenopathy Other: Diminished ascites. Signs of omental caking and mesenteric disease. Thickness of omental disease anterior to the transverse colon near the splenic flexure still quite bulky despite decreased ascites measuring approximately 2.8 cm greatest thickness in August and 2.4 cm on today's study. This measured approximately 2.6-2.7 cm on the prior PET scan. In the LEFT upper quadrant on image 59 of series 2 the cephalad extent this disease measures 2.5 x 3.5 cm as compared to 5 point by 2.8 cm. RIGHT lower quadrant implant (image 83, series 2) 11 mm previously 16 mm. Central mesenteric implant (image 87, series 2) 2.3 x 2.0 cm, previously 2.9 x 2.6 cm. This is as compared to the August study rather than in September given that is better visualized on dedicated CT. Mesenteric thickening subjectively slightly improved. Musculoskeletal: No acute bone  finding. No destructive bone process. Spinal degenerative changes. IMPRESSION: 1. Decreased ascites and decreased size of bulky omental disease and areas of peritoneal disease. Omental disease remains bulky as described. 2. Mild to moderate improvement in upper abdominal adenopathy. Particularly with respect to gastrohepatic implants or lymph nodes that were seen previously. 3. Resolution of LEFT-sided pleural fluid. 4. Subjective decrease in mesenteric thickening. 5. new signs of disease though smaller areas would be difficult to detect given the extensive nature of preexistent disease. 6. No evidence of metastatic disease to the chest. 7. Aortic atherosclerosis. Aortic Atherosclerosis (ICD10-I70.0). Electronically Signed: By: Zetta Bills M.D. 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  CEA- improving.  Currently on carbotaxol chemotherapy.   #Proceed with cycle #4 of carbotaxol. Labs today reviewed;  acceptable for treatment today.  S/p evaluation with Dr. Theora Gianotti awaiting surgery on December 9 at [facility].  Discussed with gyn-Onc..Nov 23rd- pre-op [facility].   # Myalgias-likely secondary to Taxol.  Previously improved with ?  Steroids vs tramadol.  Will check with nurse practitioner  # Nausea- Anxiety- STABLE. continue ativan prn.  # DISPOSITION: # carbo-taxol today #  Follow up on dec 2nd or 3rd- MD; labs- CBC/CMP/ca-125; NO chemo- Dr.B    All questions were answered. The patient knows to call the clinic with any problems, questions or concerns.   Cammie Sickle, MD 11/16/2019 9:00 AM

## 2019-11-17 LAB — CA 125: Cancer Antigen (CA) 125: 260 U/mL — ABNORMAL HIGH (ref 0.0–38.1)

## 2019-12-05 ENCOUNTER — Other Ambulatory Visit: Payer: Self-pay | Admitting: *Deleted

## 2019-12-05 DIAGNOSIS — C541 Malignant neoplasm of endometrium: Secondary | ICD-10-CM

## 2019-12-07 ENCOUNTER — Inpatient Hospital Stay: Payer: Medicare Other | Attending: Internal Medicine

## 2019-12-07 ENCOUNTER — Encounter: Payer: Self-pay | Admitting: Internal Medicine

## 2019-12-07 ENCOUNTER — Inpatient Hospital Stay (HOSPITAL_BASED_OUTPATIENT_CLINIC_OR_DEPARTMENT_OTHER): Payer: Medicare Other | Admitting: Internal Medicine

## 2019-12-07 DIAGNOSIS — Z9221 Personal history of antineoplastic chemotherapy: Secondary | ICD-10-CM | POA: Diagnosis not present

## 2019-12-07 DIAGNOSIS — F17210 Nicotine dependence, cigarettes, uncomplicated: Secondary | ICD-10-CM | POA: Diagnosis not present

## 2019-12-07 DIAGNOSIS — C541 Malignant neoplasm of endometrium: Secondary | ICD-10-CM | POA: Insufficient documentation

## 2019-12-07 DIAGNOSIS — R5381 Other malaise: Secondary | ICD-10-CM | POA: Diagnosis not present

## 2019-12-07 DIAGNOSIS — C786 Secondary malignant neoplasm of retroperitoneum and peritoneum: Secondary | ICD-10-CM | POA: Insufficient documentation

## 2019-12-07 DIAGNOSIS — Z79899 Other long term (current) drug therapy: Secondary | ICD-10-CM | POA: Insufficient documentation

## 2019-12-07 DIAGNOSIS — Z7952 Long term (current) use of systemic steroids: Secondary | ICD-10-CM | POA: Diagnosis not present

## 2019-12-07 DIAGNOSIS — R110 Nausea: Secondary | ICD-10-CM | POA: Diagnosis not present

## 2019-12-07 DIAGNOSIS — M791 Myalgia, unspecified site: Secondary | ICD-10-CM | POA: Diagnosis not present

## 2019-12-07 DIAGNOSIS — I10 Essential (primary) hypertension: Secondary | ICD-10-CM | POA: Diagnosis not present

## 2019-12-07 DIAGNOSIS — Z5111 Encounter for antineoplastic chemotherapy: Secondary | ICD-10-CM | POA: Insufficient documentation

## 2019-12-07 DIAGNOSIS — F419 Anxiety disorder, unspecified: Secondary | ICD-10-CM | POA: Insufficient documentation

## 2019-12-07 DIAGNOSIS — K7460 Unspecified cirrhosis of liver: Secondary | ICD-10-CM | POA: Diagnosis not present

## 2019-12-07 DIAGNOSIS — Z9049 Acquired absence of other specified parts of digestive tract: Secondary | ICD-10-CM | POA: Diagnosis not present

## 2019-12-07 DIAGNOSIS — M1990 Unspecified osteoarthritis, unspecified site: Secondary | ICD-10-CM | POA: Diagnosis not present

## 2019-12-07 DIAGNOSIS — R5383 Other fatigue: Secondary | ICD-10-CM | POA: Insufficient documentation

## 2019-12-07 LAB — COMPREHENSIVE METABOLIC PANEL
ALT: 54 U/L — ABNORMAL HIGH (ref 0–44)
AST: 58 U/L — ABNORMAL HIGH (ref 15–41)
Albumin: 3.9 g/dL (ref 3.5–5.0)
Alkaline Phosphatase: 75 U/L (ref 38–126)
Anion gap: 8 (ref 5–15)
BUN: 14 mg/dL (ref 8–23)
CO2: 26 mmol/L (ref 22–32)
Calcium: 9.3 mg/dL (ref 8.9–10.3)
Chloride: 104 mmol/L (ref 98–111)
Creatinine, Ser: 0.81 mg/dL (ref 0.44–1.00)
GFR, Estimated: >60 mL/min (ref >60)
Glucose, Bld: 123 mg/dL — ABNORMAL HIGH (ref 70–99)
Potassium: 3.9 mmol/L (ref 3.5–5.1)
Sodium: 138 mmol/L (ref 135–145)
Total Bilirubin: 0.6 mg/dL (ref 0.3–1.2)
Total Protein: 7.7 g/dL (ref 6.5–8.1)

## 2019-12-07 LAB — CBC WITH DIFFERENTIAL/PLATELET
Abs Immature Granulocytes: 0.01 10*3/uL (ref 0.00–0.07)
Basophils Absolute: 0 10*3/uL (ref 0.0–0.1)
Basophils Relative: 1 %
Eosinophils Absolute: 0 10*3/uL (ref 0.0–0.5)
Eosinophils Relative: 1 %
HCT: 33.8 % — ABNORMAL LOW (ref 36.0–46.0)
Hemoglobin: 11.6 g/dL — ABNORMAL LOW (ref 12.0–15.0)
Immature Granulocytes: 0 %
Lymphocytes Relative: 35 %
Lymphs Abs: 1.2 10*3/uL (ref 0.7–4.0)
MCH: 33.2 pg (ref 26.0–34.0)
MCHC: 34.3 g/dL (ref 30.0–36.0)
MCV: 96.8 fL (ref 80.0–100.0)
Monocytes Absolute: 0.4 10*3/uL (ref 0.1–1.0)
Monocytes Relative: 11 %
Neutro Abs: 1.8 10*3/uL (ref 1.7–7.7)
Neutrophils Relative %: 52 %
Platelets: 92 10*3/uL — ABNORMAL LOW (ref 150–400)
RBC: 3.49 MIL/uL — ABNORMAL LOW (ref 3.87–5.11)
RDW: 17.4 % — ABNORMAL HIGH (ref 11.5–15.5)
WBC: 3.5 10*3/uL — ABNORMAL LOW (ref 4.0–10.5)
nRBC: 0 % (ref 0.0–0.2)

## 2019-12-07 NOTE — Assessment & Plan Note (Addendum)
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  CEA- improving.  Currently on carbotaxol chemotherapy.   S/p  #4 of carbotaxol appx 3 weeks ago; Labs today reviewed;  acceptable for treatment today.  S/p evaluation with Dr. Theora Gianotti awaiting surgery on December 9 at [facility]. CEA from today pending.   # Myalgias-likely secondary to Taxol.  Previously improved with ?  Steroids vs tramadol.  STABLE.  # Nausea- Anxiety- STABLE;  continue ativan prn.  # DISPOSITION: # Follow up on dec 30th X-MD; labs- CBC/CMP/ca-125;cabo-taxol [q 3w]- Dr.B

## 2019-12-07 NOTE — Progress Notes (Signed)
Pt in for follow up, denies any concerns today. 

## 2019-12-08 LAB — CA 125: Cancer Antigen (CA) 125: 115 U/mL — ABNORMAL HIGH (ref 0.0–38.1)

## 2019-12-08 NOTE — Progress Notes (Signed)
[place] CONSULT NOTE  Patient Care Team: Danelle Berry, NP as PCP - General (Nurse Practitioner) Clent Jacks, RN as Oncology Nurse Navigator  CHIEF COMPLAINTS/PURPOSE OF CONSULTATION: high grade serous cancer   #  Oncology History Overview Note  #August 2021-ascitic fluid cytology-high-grade serous carcinoma of gynecologic origin; CT scan-behind the abdominal wall 4.6 x 2.1 mass ; within the mesentery 6.7 x 4.5 cm left of mid abdomen . BIOPSY of the peritoneal mass; high grade carcinoma [phone] [Dr.Byrnett]; SEP 9th 2021- PET scan-shows peritoneal carcinomatosis; uterine uptake [Dr.Secord; unable to biopsy cervical stenosis] sigmoid colonic uptake concerning for malignancy.  No ovarian uptake.  CEA 125 +1300; CEA-2.    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 GENETICS- NEG; HRD-    # PALLIATIVE CARE EVALUATION:  # PAIN MANAGEMENT:    DIAGNOSIS: Uterine cancer  STAGE:   IV      ;  GOALS: control  CURRENT/MOST RECENT THERAPY : Carbotaxol    Uterine cancer ([place]) (Resolved)  09/04/2019 Initial Diagnosis   Gynecologic cancer [facility])   Endometrial cancer ([place])  09/14/2019 -  Chemotherapy   The patient had dexamethasone (DECADRON) 4 MG tablet, 8 mg, Oral, Daily, 1 of 1 cycle, Start date: --, End date: -- palonosetron (ALOXI) injection 0.25 mg, 0.25 mg, Intravenous,  Once, 4 of 6 cycles Administration: 0.25 mg (09/14/2019), 0.25 mg (10/05/2019), 0.25 mg (10/26/2019), 0.25 mg (11/16/2019) CARBOplatin (PARAPLATIN) 580 mg in sodium chloride 0.9 % 250 mL chemo infusion, 580 mg (100 % of original dose 577.8 mg), Intravenous,  Once, 4 of 6 cycles Dose modification:   (original dose 577.8 mg, Cycle 1) Administration: 580 mg (09/14/2019), 540 mg  (10/05/2019), 540 mg (10/26/2019), 540 mg (11/16/2019) fosaprepitant (EMEND) 150 mg in sodium chloride 0.9 % 145 mL IVPB, 150 mg, Intravenous,  Once, 4 of 6 cycles Administration: 150 mg (09/14/2019), 150 mg (10/05/2019), 150 mg (10/26/2019), 150 mg (11/16/2019) PACLitaxel (TAXOL) 318 mg in sodium chloride 0.9 % 500 mL chemo infusion (> $RemoveBef'80mg'rTcHdtKnro$ /m2), 175 mg/m2 = 318 mg, Intravenous,  Once, 4 of 6 cycles Administration: 318 mg (09/14/2019), 318 mg (10/05/2019), 318 mg (10/26/2019), 318 mg (11/16/2019)  for chemotherapy treatment.    10/05/2019 Initial Diagnosis   Endometrial cancer (HCC)    Genetic Testing   Negative germline genetic testing. No pathogenic variants identified on the [facility] panel. The report date is 10/01/2019. HRD testing ([employer]) still pending.   The [facility] gene panel offered by [employer] includes sequencing and deletion/duplication testing of the following 35 genes: APC, ATM, AXIN2, BARD1, BMPR1A, BRCA1, BRCA2, BRIP1, CHD1, CDK4, CDKN2A, CHEK2, EPCAM (large rearrangement only), HOXB13, GALNT12, MLH1, MSH2, MSH3, MSH6, MUTYH, NBN, NTHL1, PALB2, PMS2, PTEN, RAD51C, RAD51D, RNF43, RPS20, SMAD4, STK11, and TP53. Sequencing was performed for select regions of POLE and POLD1, and large rearrangement analysis was performed for select regions of GREM1.       HISTORY OF PRESENTING ILLNESS:  Jeanette Allen 64 y.o.  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  Patient status post cycle #4 of chemotherapy.  In the interim patient has been evaluated by gynecology oncology for surgery.  Patient complains of mild fatigue.  Otherwise denies any significant nausea or vomiting.  No chest pain or shortness of breath or cough.  Complains of mild cramps in  the legs.  No swelling.  No significant tingling or numbness.  Review of Systems  Constitutional: Positive for malaise/fatigue.  Negative for chills, diaphoresis, fever and weight loss.  HENT: Negative for nosebleeds and sore throat.   Eyes: Negative for double vision.  Respiratory: Negative for cough, hemoptysis, sputum production, shortness of breath and wheezing.   Cardiovascular: Negative for chest pain, palpitations, orthopnea and leg swelling.  Gastrointestinal: Positive for abdominal pain and nausea. Negative for blood in stool, constipation, diarrhea, heartburn, melena and vomiting.  Genitourinary: Negative for dysuria, frequency and urgency.  Musculoskeletal: Positive for myalgias. Negative for back pain and joint pain.  Skin: Negative.  Negative for itching and rash.  Neurological: Negative for dizziness, tingling, focal weakness, weakness and headaches.  Endo/Heme/Allergies: Does not bruise/bleed easily.  Psychiatric/Behavioral: Negative for depression. The patient is not nervous/anxious and does not have insomnia.      MEDICAL HISTORY:  Past Medical History:  Diagnosis Date  . Arthritis   . Cancer (HCC)   . Hypertension     SURGICAL HISTORY: Past Surgical History:  Procedure Laterality Date  . APPENDECTOMY    . COLONOSCOPY WITH PROPOFOL N/A 10/01/2019   Procedure: COLONOSCOPY WITH PROPOFOL;  Surgeon: Toney Reil, MD;  Location: [facility] ENDOSCOPY;  Service: Gastroenterology;  Laterality: N/A;  . ESOPHAGOGASTRODUODENOSCOPY (EGD) WITH PROPOFOL N/A 09/12/2019   Procedure: ESOPHAGOGASTRODUODENOSCOPY (EGD) WITH PROPOFOL;  Surgeon: Toney Reil, MD;  Location: [facility] ENDOSCOPY;  Service: Gastroenterology;  Laterality: N/A;    SOCIAL HISTORY: Social History   Socioeconomic History  . Marital status: Married    Spouse name: Not on file  . Number of children: Not on file  . Years of education: Not on file  . Highest education level: Not on file  Occupational History  . Not on file  Tobacco Use  . Smoking status: Current Every Day Smoker  . Smokeless tobacco: Never Used  Vaping Use  .  Vaping Use: Never used  Substance and Sexual Activity  . Alcohol use: Not Currently  . Drug use: Never  . Sexual activity: Yes  Other Topics Concern  . Not on file  Social History Narrative   Lives with husband; near [place]. Worked Diplomatic Services operational officer at [employer]; smoke 1/2 ppd/slowed; no alcohol; 3 children [2 boys; one girl- [employer]]   Social Determinants of Health   Financial Resource Strain:   . Difficulty of Paying Living Expenses: Not on file  Food Insecurity:   . Worried About Programme researcher, broadcasting/film/video in the Last Year: Not on file  . Ran Out of Food in the Last Year: Not on file  Transportation Needs:   . Lack of Transportation (Medical): Not on file  . Lack of Transportation (Non-Medical): Not on file  Physical Activity:   . Days of Exercise per Week: Not on file  . Minutes of Exercise per Session: Not on file  Stress:   . Feeling of Stress : Not on file  Social Connections:   . Frequency of Communication with Friends and Family: Not on file  . Frequency of Social Gatherings with Friends and Family: Not on file  . Attends Religious Services: Not on file  . Active Member of Clubs or Organizations: Not on file  . Attends Banker Meetings: Not on file  . Marital Status: Not on file  Intimate Partner Violence:   . Fear of Current or Ex-Partner: Not on file  . Emotionally Abused: Not on file  . Physically Abused: Not on  file  . Sexually Abused: Not on file    FAMILY HISTORY: Family History  Problem Relation Age of Onset  . Diabetes Maternal Grandmother   . Hypertension Maternal Grandmother   . Stroke Maternal Grandmother     ALLERGIES:  has No Known Allergies.  MEDICATIONS:  Current Outpatient Medications  Medication Sig Dispense Refill  . acetaminophen (TYLENOL) 500 MG tablet Take by mouth.    . ergocalciferol (VITAMIN D2) 1.25 MG (50000 UT) capsule Take by mouth.    Marland Kitchen FLUoxetine (PROZAC) 20 MG capsule Take by mouth.    . losartan (COZAAR) 50 MG  tablet Take by mouth.    Marland Kitchen omeprazole (PRILOSEC) 40 MG capsule Take 1 capsule (40 mg total) by mouth 2 (two) times daily before a meal. (Patient taking differently: Take 40 mg by mouth daily. ) 30 capsule 3  . ondansetron (ZOFRAN) 8 MG tablet One pill every 8 hours as needed for nausea/vomitting. 60 tablet 1  . ondansetron (ZOFRAN-ODT) 4 MG disintegrating tablet Take 4 mg by mouth every 8 (eight) hours as needed for nausea or vomiting.     . polyethylene glycol powder (GLYCOLAX/MIRALAX) 17 GM/SCOOP powder Take by mouth.    . prochlorperazine (COMPAZINE) 10 MG tablet Take 1 tablet (10 mg total) by mouth every 6 (six) hours as needed for nausea or vomiting. 40 tablet 1  . LORazepam (ATIVAN) 0.5 MG tablet Take 1-2 pill at night for anxiety/nausea. (Patient not taking: Reported on 12/07/2019) 60 tablet 0  . Pitavastatin Calcium (LIVALO) 4 MG TABS Take 4 mg by mouth daily. (Patient not taking: Reported on 10/11/2019)    . traMADol (ULTRAM) 50 MG tablet Take 1 tablet (50 mg total) by mouth every 6 (six) hours as needed for severe pain. (Patient not taking: Reported on 12/07/2019) 15 tablet 0   No current facility-administered medications for this visit.      Marland Kitchen  PHYSICAL EXAMINATION: ECOG PERFORMANCE STATUS: 1 - Symptomatic but completely ambulatory  Vitals:   12/07/19 1339  BP: 125/71  Pulse: 92  Resp: 18  Temp: 98.5 F (36.9 C)  SpO2: 100%   Filed Weights   12/07/19 1339  Weight: 154 lb (69.9 kg)    Physical Exam Constitutional:      Comments: Ambulating independently.  Accompanied by husband.  HENT:     Head: Normocephalic and atraumatic.     Mouth/Throat:     Pharynx: No oropharyngeal exudate.  Eyes:     Pupils: Pupils are equal, round, and reactive to light.  Cardiovascular:     Rate and Rhythm: Normal rate and regular rhythm.  Pulmonary:     Effort: Pulmonary effort is normal. No respiratory distress.     Breath sounds: Normal breath sounds. No wheezing.  Abdominal:      General: Bowel sounds are normal. There is distension.     Palpations: Abdomen is soft. There is no mass.     Tenderness: There is no abdominal tenderness. There is no guarding or rebound.  Musculoskeletal:        General: No tenderness. Normal range of motion.     Cervical back: Normal range of motion and neck supple.  Skin:    General: Skin is warm.  Neurological:     Mental Status: She is alert and oriented to person, place, and time.  Psychiatric:        Mood and Affect: Affect normal.      LABORATORY DATA:  I have reviewed the data as listed Lab  Results  Component Value Date   WBC 3.5 (L) 12/07/2019   HGB 11.6 (L) 12/07/2019   HCT 33.8 (L) 12/07/2019   MCV 96.8 12/07/2019   PLT 92 (L) 12/07/2019   Recent Labs    09/14/19 0813 09/14/19 0813 09/28/19 1006 10/05/19 0807 10/15/19 1107 10/26/19 0814 10/26/19 0814 11/07/19 1329 11/16/19 0803 12/07/19 1318  NA 139   < > 138 140   < > 138   < > 140 138 138  K 4.0   < > 4.0 4.3   < > 4.2   < > 4.3 4.2 3.9  CL 104   < > 104 104   < > 105   < > 108 106 104  CO2 24   < > 26 27   < > 26   < > _0 GLUCOSE 104*   < > 106* 108*   < > 125*   < > 84 121* 123*  BUN 12   < > 14 13   < > 15   < > _1 CREATININE 0.93   < > 0.73 0.86   < > 0.82   < > 0.97 0.80 0.81  CALCIUM 9.2   < > 9.0 9.9   < > 9.6   < > 9.5 9.3 9.3  GFRNONAA >60   < > >60 >60   < > >60   < > >60 >60 >60  GFRAA >60  --  >60 >60  --   --   --   --   --   --   PROT 7.5   < >  --  7.6  --  8.0  --   --  7.9 7.7  ALBUMIN 3.5   < >  --  4.0  --  4.2  --   --  3.9 3.9  AST 30   < >  --  37  --  44*  --   --  48* 58*  ALT 21   < >  --  30  --  42  --   --  47* 54*  ALKPHOS 71   < >  --  72  --  88  --   --  93 75  BILITOT 0.5   < >  --  0.5  --  0.6  --   --  0.5 0.6   < > = values in this interval not displayed.    RADIOGRAPHIC STUDIES: I have personally reviewed the radiological images as listed and agreed with the findings in the report. No results  found.  ASSESSMENT & PLAN:   Endometrial cancer ([place]) # STAGE IV-High-grade serous carcinoma of gynecologic origin [likley endometrial] pre-treatment-CEA 125 +1500;CT Nov 9th- Partial reponse noted on CT scan.  CEA- improving.  Currently on carbotaxol chemotherapy.   S/p  #4 of carbotaxol appx 3 weeks ago; Labs today reviewed;  acceptable for treatment today.  S/p evaluation with Dr. Theora Gianotti awaiting surgery on December 9 at [facility]. CEA from today pending.   # Myalgias-likely secondary to Taxol.  Previously improved with ?  Steroids vs tramadol.  STABLE.  # Nausea- Anxiety- STABLE;  continue ativan prn.  # DISPOSITION: # Follow up on dec 30th X-MD; labs- CBC/CMP/ca-125;cabo-taxol [q 3w]- Dr.B    All questions were answered. The patient knows to call the clinic with any problems, questions or concerns.   Cammie Sickle, MD 12/08/2019 7:35 AM

## 2019-12-14 ENCOUNTER — Telehealth: Payer: Self-pay | Admitting: Internal Medicine

## 2019-12-14 DIAGNOSIS — C541 Malignant neoplasm of endometrium: Secondary | ICD-10-CM

## 2019-12-14 NOTE — Telephone Encounter (Signed)
On 12/09- spoke to Dr.Secord re: significant bulky disease still noted on laparoscopy.  Hence surgery not done.  Recommend 3 more cycles of chemotherapy.   C-ON 9/22- schedule MD; CBC CMP Ca1 2 5; carbotaxol chemotherapy.

## 2019-12-17 ENCOUNTER — Encounter: Payer: Self-pay | Admitting: *Deleted

## 2019-12-17 ENCOUNTER — Other Ambulatory Visit: Payer: Self-pay | Admitting: Internal Medicine

## 2019-12-17 NOTE — Addendum Note (Signed)
Addended by: Gloris Ham on: 12/17/2019 12:32 PM   Modules accepted: Orders

## 2019-12-26 ENCOUNTER — Ambulatory Visit: Payer: Medicare Other

## 2019-12-26 ENCOUNTER — Inpatient Hospital Stay (HOSPITAL_BASED_OUTPATIENT_CLINIC_OR_DEPARTMENT_OTHER): Payer: Medicare Other | Admitting: Internal Medicine

## 2019-12-26 ENCOUNTER — Inpatient Hospital Stay: Payer: Medicare Other

## 2019-12-26 ENCOUNTER — Encounter: Payer: Self-pay | Admitting: Internal Medicine

## 2019-12-26 DIAGNOSIS — C541 Malignant neoplasm of endometrium: Secondary | ICD-10-CM

## 2019-12-26 DIAGNOSIS — Z5111 Encounter for antineoplastic chemotherapy: Secondary | ICD-10-CM | POA: Diagnosis not present

## 2019-12-26 DIAGNOSIS — Z7189 Other specified counseling: Secondary | ICD-10-CM

## 2019-12-26 LAB — CBC WITH DIFFERENTIAL/PLATELET
Abs Immature Granulocytes: 0.02 10*3/uL (ref 0.00–0.07)
Basophils Absolute: 0 10*3/uL (ref 0.0–0.1)
Basophils Relative: 0 %
Eosinophils Absolute: 0.1 10*3/uL (ref 0.0–0.5)
Eosinophils Relative: 3 %
HCT: 35.7 % — ABNORMAL LOW (ref 36.0–46.0)
Hemoglobin: 12.5 g/dL (ref 12.0–15.0)
Immature Granulocytes: 0 %
Lymphocytes Relative: 24 %
Lymphs Abs: 1.1 10*3/uL (ref 0.7–4.0)
MCH: 35.1 pg — ABNORMAL HIGH (ref 26.0–34.0)
MCHC: 35 g/dL (ref 30.0–36.0)
MCV: 100.3 fL — ABNORMAL HIGH (ref 80.0–100.0)
Monocytes Absolute: 0.5 10*3/uL (ref 0.1–1.0)
Monocytes Relative: 10 %
Neutro Abs: 3 10*3/uL (ref 1.7–7.7)
Neutrophils Relative %: 63 %
Platelets: 180 10*3/uL (ref 150–400)
RBC: 3.56 MIL/uL — ABNORMAL LOW (ref 3.87–5.11)
RDW: 16.6 % — ABNORMAL HIGH (ref 11.5–15.5)
WBC: 4.8 10*3/uL (ref 4.0–10.5)
nRBC: 0 % (ref 0.0–0.2)

## 2019-12-26 LAB — COMPREHENSIVE METABOLIC PANEL
ALT: 28 U/L (ref 0–44)
AST: 28 U/L (ref 15–41)
Albumin: 4.1 g/dL (ref 3.5–5.0)
Alkaline Phosphatase: 75 U/L (ref 38–126)
Anion gap: 7 (ref 5–15)
BUN: 19 mg/dL (ref 8–23)
CO2: 26 mmol/L (ref 22–32)
Calcium: 9.4 mg/dL (ref 8.9–10.3)
Chloride: 104 mmol/L (ref 98–111)
Creatinine, Ser: 0.86 mg/dL (ref 0.44–1.00)
GFR, Estimated: >60 mL/min (ref >60)
Glucose, Bld: 94 mg/dL (ref 70–99)
Potassium: 4 mmol/L (ref 3.5–5.1)
Sodium: 137 mmol/L (ref 135–145)
Total Bilirubin: 0.5 mg/dL (ref 0.3–1.2)
Total Protein: 7.9 g/dL (ref 6.5–8.1)

## 2019-12-26 MED ORDER — PACLITAXEL CHEMO INJECTION 300 MG/50ML
175.0000 mg/m2 | Freq: Once | INTRAVENOUS | Status: AC
  Administered 2019-12-26: 11:00:00 318 mg via INTRAVENOUS
  Filled 2019-12-26: qty 53

## 2019-12-26 MED ORDER — DIPHENHYDRAMINE HCL 50 MG/ML IJ SOLN
50.0000 mg | Freq: Once | INTRAMUSCULAR | Status: AC
  Administered 2019-12-26: 10:00:00 50 mg via INTRAVENOUS
  Filled 2019-12-26: qty 1

## 2019-12-26 MED ORDER — SODIUM CHLORIDE 0.9 % IV SOLN
10.0000 mg | Freq: Once | INTRAVENOUS | Status: AC
  Administered 2019-12-26: 10:00:00 10 mg via INTRAVENOUS
  Filled 2019-12-26: qty 10

## 2019-12-26 MED ORDER — LORAZEPAM 0.5 MG PO TABS
0.5000 mg | ORAL_TABLET | Freq: Once | ORAL | Status: AC
  Administered 2019-12-26: 09:00:00 0.5 mg via ORAL
  Filled 2019-12-26: qty 1

## 2019-12-26 MED ORDER — SODIUM CHLORIDE 0.9 % IV SOLN
542.4000 mg | Freq: Once | INTRAVENOUS | Status: AC
  Administered 2019-12-26: 14:00:00 540 mg via INTRAVENOUS
  Filled 2019-12-26: qty 54

## 2019-12-26 MED ORDER — PREDNISONE 10 MG PO TABS: ORAL_TABLET | ORAL | 0 refills | Status: DC

## 2019-12-26 MED ORDER — SODIUM CHLORIDE 0.9 % IV SOLN
Freq: Once | INTRAVENOUS | Status: AC
  Filled 2019-12-26: qty 250

## 2019-12-26 MED ORDER — PALONOSETRON HCL INJECTION 0.25 MG/5ML
0.2500 mg | Freq: Once | INTRAVENOUS | Status: AC
  Administered 2019-12-26: 09:00:00 0.25 mg via INTRAVENOUS
  Filled 2019-12-26: qty 5

## 2019-12-26 MED ORDER — FAMOTIDINE IN NACL 20-0.9 MG/50ML-% IV SOLN
20.0000 mg | Freq: Once | INTRAVENOUS | Status: AC
  Administered 2019-12-26: 10:00:00 20 mg via INTRAVENOUS
  Filled 2019-12-26: qty 50

## 2019-12-26 MED ORDER — SODIUM CHLORIDE 0.9 % IV SOLN
150.0000 mg | Freq: Once | INTRAVENOUS | Status: AC
  Administered 2019-12-26: 10:00:00 150 mg via INTRAVENOUS
  Filled 2019-12-26: qty 150

## 2019-12-26 NOTE — Progress Notes (Signed)
States that the the last couple times she has had tx she had bilateral leg pain 2 days after. States she was given a steroid both times that helped. Wants to make sure she has steroids given the christmas weekend.   Wants to know if she should be taking the livalo

## 2019-12-26 NOTE — Progress Notes (Signed)
[place] CONSULT NOTE  Patient Care Team: Danelle Berry, NP as PCP - General (Nurse Practitioner) Clent Jacks, RN as Oncology Nurse Navigator  CHIEF COMPLAINTS/PURPOSE OF CONSULTATION: high grade serous cancer   #  Oncology History Overview Note  #August 2021-ascitic fluid cytology-high-grade serous carcinoma of gynecologic origin; CT scan-behind the abdominal wall 4.6 x 2.1 mass ; within the mesentery 6.7 x 4.5 cm left of mid abdomen . BIOPSY of the peritoneal mass; high grade carcinoma [phone] [Dr.Byrnett]; SEP 9th 2021- PET scan-shows peritoneal carcinomatosis; uterine uptake [Dr.Secord; unable to biopsy cervical stenosis] sigmoid colonic uptake concerning for malignancy.  No ovarian uptake.  CEA 125 +1300; CEA-2.    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 GENETICS- NEG; HRD-    # PALLIATIVE CARE EVALUATION:  # PAIN MANAGEMENT:    DIAGNOSIS: Uterine cancer  STAGE:   IV      ;  GOALS: control  CURRENT/MOST RECENT THERAPY : Carbotaxol    Uterine cancer ([place]) (Resolved)  09/04/2019 Initial Diagnosis   Gynecologic cancer [facility]   Endometrial cancer ([place])  09/14/2019 -  Chemotherapy   The patient had dexamethasone (DECADRON) 4 MG tablet, 8 mg, Oral, Daily, 1 of 1 cycle, Start date: --, End date: -- palonosetron (ALOXI) injection 0.25 mg, 0.25 mg, Intravenous,  Once, 5 of 9 cycles Administration: 0.25 mg (09/14/2019), 0.25 mg (10/05/2019), 0.25 mg (10/26/2019), 0.25 mg (11/16/2019), 0.25 mg (12/26/2019) CARBOplatin (PARAPLATIN) 580 mg in sodium chloride 0.9 %  250 mL chemo infusion, 580 mg (100 % of original dose 577.8 mg), Intravenous,  Once, 5 of 9 cycles Dose modification:   (original dose 577.8 mg, Cycle 1) Administration: 580 mg (09/14/2019), 540 mg (10/05/2019), 540 mg (10/26/2019), 540 mg (11/16/2019), 540 mg (12/26/2019) fosaprepitant (EMEND) 150 mg in sodium chloride 0.9 % 145 mL IVPB, 150 mg, Intravenous,  Once, 5 of 9 cycles Administration: 150 mg (09/14/2019), 150 mg (10/05/2019), 150 mg (10/26/2019), 150 mg (11/16/2019), 150 mg (12/26/2019) PACLitaxel (TAXOL) 318 mg in sodium chloride 0.9 % 500 mL chemo infusion (> 87m/m2), 175 mg/m2 = 318 mg, Intravenous,  Once, 5 of 9 cycles Administration: 318 mg (09/14/2019), 318 mg (10/05/2019), 318 mg (10/26/2019), 318 mg (11/16/2019), 318 mg (12/26/2019)  for chemotherapy treatment.    10/05/2019 Initial Diagnosis   Endometrial cancer (HCC)    Genetic Testing   Negative germline genetic testing. No pathogenic variants identified on the [facility]. The report date is 10/01/2019. HRD testing ([employer]) still pending.   The [facility] panel offered by [employer]includes sequencing and deletion/duplication testing of the following 35 genes: APC, ATM, AXIN2, BARD1, BMPR1A, BRCA1, BRCA2, BRIP1, CHD1, CDK4, CDKN2A, CHEK2, EPCAM (large rearrangement only), HOXB13, GALNT12, MLH1, MSH2, MSH3, MSH6, MUTYH, NBN, NTHL1, PALB2, PMS2, PTEN, RAD51C, RAD51D, RNF43, RPS20, SMAD4, STK11, and TP53. Sequencing was performed for select regions of POLE and POLD1, and large rearrangement analysis was performed for select regions of GREM1.       HISTORY OF PRESENTING ILLNESS:  Jeanette Allen 64y.o.  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  [facility].  However  on laparoscopy patient noted to have bulky disease needing to have  multiple bowel resections; and hence the surgery was aborted.  Patient is here to proceed with neoadjuvant chemotherapy with carbotaxol.  Patient complains of mild fatigue.  Otherwise denies any significant nausea or vomiting.  No chest pain or shortness of breath or cough. No significant tingling or numbness.  Review of Systems  Constitutional: Positive for malaise/fatigue. Negative for chills, diaphoresis, fever and weight loss.  HENT: Negative for nosebleeds and sore throat.   Eyes: Negative for double vision.  Respiratory: Negative for cough, hemoptysis, sputum production, shortness of breath and wheezing.   Cardiovascular: Negative for chest pain, palpitations, orthopnea and leg swelling.  Gastrointestinal: Positive for abdominal pain and nausea. Negative for blood in stool, constipation, diarrhea, heartburn, melena and vomiting.  Genitourinary: Negative for dysuria, frequency and urgency.  Musculoskeletal: Positive for myalgias. Negative for back pain and joint pain.  Skin: Negative.  Negative for itching and rash.  Neurological: Negative for dizziness, tingling, focal weakness, weakness and headaches.  Endo/Heme/Allergies: Does not bruise/bleed easily.  Psychiatric/Behavioral: Negative for depression. The patient is not nervous/anxious and does not have insomnia.      MEDICAL HISTORY:  Past Medical History:  Diagnosis Date  . Arthritis   . Cancer ([place])   . Hypertension     SURGICAL HISTORY: Past Surgical History:  Procedure Laterality Date  . APPENDECTOMY    . COLONOSCOPY WITH PROPOFOL N/A 10/01/2019   Procedure: COLONOSCOPY WITH PROPOFOL;  Surgeon: Lin Landsman, MD;  Location[facility] END[facility];  Service: Gastroenterology;  Laterality: N/A;  . ESOPHAGOGASTRODUODENOSCOPY (EGD) WITH PROPOFOL N/A 09/12/2019   Procedure: ESOPHAGOGASTRODUODENOSCOPY (EGD) WITH PROPOFOL;  Surgeon: Lin Landsman, MD;  Location: [facility] ENDOSCOPY;  Service: Gastroenterology;  Laterality: N/A;     SOCIAL HISTORY: Social History   Socioeconomic History  . Marital status: Married    Spouse name: Not on file  . Number of children: Not on file  . Years of education: Not on file  . Highest education level: Not on file  Occupational History  . Not on file  Tobacco Use  . Smoking status: Current Every Day Smoker  . Smokeless tobacco: Never Used  Vaping Use  . Vaping Use: Never used  Substance and Sexual Activity  . Alcohol use: Not Currently  . Drug use: Never  . Sexual activity: Yes  Other Topics Concern  . Not on file  Social History Narrative   Lives with husband; near [place]. Worked Network engineer at [employer]; smoke 1/2 ppd/slowed; no alcohol; 3 children [2 boys; one girl- [employer]]   Social Determinants of Health   Financial Resource Strain: Not on file  Food Insecurity: Not on file  Transportation Needs: Not on file  Physical Activity: Not on file  Stress: Not on file  Social Connections: Not on file  Intimate Partner Violence: Not on file    FAMILY HISTORY: Family History  Problem Relation Age of Onset  . Diabetes Maternal Grandmother   . Hypertension Maternal Grandmother   . Stroke Maternal Grandmother     ALLERGIES:  has No Known Allergies.  MEDICATIONS:  Current Outpatient Medications  Medication Sig Dispense Refill  . acetaminophen (TYLENOL) 500 MG tablet Take by mouth.    . ergocalciferol (VITAMIN D2) 1.25 MG (50000 UT) capsule Take by mouth.    Marland Kitchen FLUoxetine (PROZAC) 20 MG capsule Take by mouth.    Marland Kitchen LORazepam (ATIVAN) 0.5 MG tablet Take 1-2 pill at night for anxiety/nausea. 60 tablet  0  . losartan (COZAAR) 50 MG tablet Take by mouth.    Marland Kitchen omeprazole (PRILOSEC) 40 MG capsule Take 1 capsule (40 mg total) by mouth 2 (two) times daily before a meal. (Patient taking differently: Take 40 mg by mouth daily.) 30 capsule 3  . ondansetron (ZOFRAN) 8 MG tablet One pill every 8 hours as needed for nausea/vomitting. 60 tablet 1  . ondansetron  (ZOFRAN-ODT) 4 MG disintegrating tablet Take 4 mg by mouth every 8 (eight) hours as needed for nausea or vomiting.     . polyethylene glycol powder (GLYCOLAX/MIRALAX) 17 GM/SCOOP powder Take by mouth.    . prochlorperazine (COMPAZINE) 10 MG tablet Take 1 tablet (10 mg total) by mouth every 6 (six) hours as needed for nausea or vomiting. 40 tablet 1  . Pitavastatin Calcium (LIVALO) 4 MG TABS Take 4 mg by mouth daily. (Patient not taking: Reported on 12/26/2019)    . predniSONE (DELTASONE) 10 MG tablet Take 1 pill a day x 5 days; start the day after chemo. Take in AM/wiith Breakfast. 20 tablet 0  . traMADol (ULTRAM) 50 MG tablet Take 1 tablet (50 mg total) by mouth every 6 (six) hours as needed for severe pain. (Patient not taking: No sig reported) 15 tablet 0   No current facility-administered medications for this visit.      Marland Kitchen  PHYSICAL EXAMINATION: ECOG PERFORMANCE STATUS: 1 - Symptomatic but completely ambulatory  Vitals:   12/26/19 0824  BP: (!) 144/81  Pulse: 70  Resp: 16  Temp: (!) 97 F (36.1 C)  SpO2: 100%   Filed Weights   12/26/19 0824  Weight: 152 lb (68.9 kg)    Physical Exam Constitutional:      Comments: Ambulating independently.  Accompanied by husband.  HENT:     Head: Normocephalic and atraumatic.     Mouth/Throat:     Pharynx: No oropharyngeal exudate.  Eyes:     Pupils: Pupils are equal, round, and reactive to light.  Cardiovascular:     Rate and Rhythm: Normal rate and regular rhythm.  Pulmonary:     Effort: Pulmonary effort is normal. No respiratory distress.     Breath sounds: Normal breath sounds. No wheezing.  Abdominal:     General: Bowel sounds are normal. There is distension.     Palpations: Abdomen is soft. There is no mass.     Tenderness: There is no abdominal tenderness. There is no guarding or rebound.  Musculoskeletal:        General: No tenderness. Normal range of motion.     Cervical back: Normal range of motion and neck supple.   Skin:    General: Skin is warm.  Neurological:     Mental Status: She is alert and oriented to person, place, and time.  Psychiatric:        Mood and Affect: Affect normal.      LABORATORY DATA:  I have reviewed the data as listed Lab Results  Component Value Date   WBC 4.8 12/26/2019   HGB 12.5 12/26/2019   HCT 35.7 (L) 12/26/2019   MCV 100.3 (H) 12/26/2019   PLT 180 12/26/2019   Recent Labs    09/14/19 0813 09/28/19 1006 10/05/19 0807 10/15/19 1107 11/16/19 0803 12/07/19 1318 12/26/19 0804  NA 139 138 140   < > 138 138 137  K 4.0 4.0 4.3   < > 4.2 3.9 4.0  CL 104 104 104   < > 106 104 104  CO2 24  26 27   < > '25 26 26  ' GLUCOSE 104* 106* 108*   < > 121* 123* 94  BUN '12 14 13   ' < > '12 14 19  ' CREATININE 0.93 0.73 0.86   < > 0.80 0.81 0.86  CALCIUM 9.2 9.0 9.9   < > 9.3 9.3 9.4  GFRNONAA >60 >60 >60   < > >60 >60 >60  GFRAA >60 >60 >60  --   --   --   --   PROT 7.5  --  7.6   < > 7.9 7.7 7.9  ALBUMIN 3.5  --  4.0   < > 3.9 3.9 4.1  AST 30  --  37   < > 48* 58* 28  ALT 21  --  30   < > 47* 54* 28  ALKPHOS 71  --  72   < > 93 75 75  BILITOT 0.5  --  0.5   < > 0.5 0.6 0.5   < > = values in this interval not displayed.    RADIOGRAPHIC STUDIES: I have personally reviewed the radiological images as listed and agreed with the findings in the report. No results found.  ASSESSMENT & PLAN:   Endometrial cancer ([place]) # STAGE IV-High-grade serous carcinoma of gynecologic origin [likley endometrial] pre-treatment-CEA 125 +1500;CT Nov 9th- Partial reponse noted on CT scan.  CEA- improving.  Currently s/p #4 carbotaxol chemotherapy.  December 13, 2019-laparoscopy showed bulky disease needing bowel resection-hand surgery was aborted to proceed with 3 more cycles of chemotherapy  #Proceed with cycle #5 of carbotaxol chemotherapy today. Labs today reviewed;  acceptable for treatment today. ?re-staging with PET scan.    # Myalgias-likely secondary to Taxol.  Previously improved  with ?  Prednisone 10 mg/day x5 days; post chemo.   # Nausea- Anxiety- STABLE;  continue ativan prn.  # COVID booster-declines.   # DISPOSITION: # in 10 days- labs- cbc/bmp # Follow up in 3 weeks- MD; labs- CBC/CMP/ca-125;cabo-taxol;  Dr.B    All questions were answered. The patient knows to call the clinic with any problems, questions or concerns.   Cammie Sickle, MD 12/26/2019 7:11 PM

## 2019-12-26 NOTE — Assessment & Plan Note (Addendum)
#   STAGE IV-High-grade serous carcinoma of gynecologic origin [facility] endometrial] pre-treatment-CEA 125 +1500;CT Nov 9th- Partial reponse noted on CT scan.  CEA- improving.  Currently s/p #4 carbotaxol chemotherapy.  December 13, 2019-laparoscopy showed bulky disease needing bowel resection-hand surgery was aborted to proceed with 3 more cycles of chemotherapy  #Proceed with cycle #5 of carbotaxol chemotherapy today. Labs today reviewed;  acceptable for treatment today. ?re-staging with PET scan.    # Myalgias-likely secondary to Taxol.  Previously improved with ?  Prednisone 10 mg/day x5 days; post chemo.   # Nausea- Anxiety- STABLE;  continue ativan prn.  # COVID booster-declines.   # DISPOSITION: # in 10 days- labs- cbc/bmp # Follow up in 3 weeks- MD; labs- CBC/CMP/ca-125;cabo-taxol;  Dr.B

## 2019-12-26 NOTE — Progress Notes (Signed)
Stable at discharge 

## 2019-12-27 LAB — CA 125: Cancer Antigen (CA) 125: 99.3 U/mL — ABNORMAL HIGH (ref 0.0–38.1)

## 2020-01-03 ENCOUNTER — Ambulatory Visit: Payer: Medicare Other

## 2020-01-03 ENCOUNTER — Ambulatory Visit: Payer: Medicare Other | Admitting: Oncology

## 2020-01-03 ENCOUNTER — Other Ambulatory Visit: Payer: Medicare Other

## 2020-01-07 ENCOUNTER — Inpatient Hospital Stay: Payer: Medicare Other

## 2020-01-09 ENCOUNTER — Inpatient Hospital Stay: Payer: Medicare Other | Attending: Internal Medicine

## 2020-01-09 ENCOUNTER — Other Ambulatory Visit: Payer: Self-pay | Admitting: *Deleted

## 2020-01-09 DIAGNOSIS — M1990 Unspecified osteoarthritis, unspecified site: Secondary | ICD-10-CM | POA: Insufficient documentation

## 2020-01-09 DIAGNOSIS — R5381 Other malaise: Secondary | ICD-10-CM | POA: Insufficient documentation

## 2020-01-09 DIAGNOSIS — F419 Anxiety disorder, unspecified: Secondary | ICD-10-CM | POA: Insufficient documentation

## 2020-01-09 DIAGNOSIS — Z79899 Other long term (current) drug therapy: Secondary | ICD-10-CM | POA: Insufficient documentation

## 2020-01-09 DIAGNOSIS — Z7952 Long term (current) use of systemic steroids: Secondary | ICD-10-CM | POA: Diagnosis not present

## 2020-01-09 DIAGNOSIS — C786 Secondary malignant neoplasm of retroperitoneum and peritoneum: Secondary | ICD-10-CM | POA: Insufficient documentation

## 2020-01-09 DIAGNOSIS — K5900 Constipation, unspecified: Secondary | ICD-10-CM | POA: Insufficient documentation

## 2020-01-09 DIAGNOSIS — R5383 Other fatigue: Secondary | ICD-10-CM | POA: Insufficient documentation

## 2020-01-09 DIAGNOSIS — C541 Malignant neoplasm of endometrium: Secondary | ICD-10-CM

## 2020-01-09 DIAGNOSIS — K7460 Unspecified cirrhosis of liver: Secondary | ICD-10-CM | POA: Diagnosis not present

## 2020-01-09 DIAGNOSIS — Z5111 Encounter for antineoplastic chemotherapy: Secondary | ICD-10-CM | POA: Diagnosis not present

## 2020-01-09 DIAGNOSIS — F17210 Nicotine dependence, cigarettes, uncomplicated: Secondary | ICD-10-CM | POA: Diagnosis not present

## 2020-01-09 DIAGNOSIS — R110 Nausea: Secondary | ICD-10-CM | POA: Diagnosis not present

## 2020-01-09 DIAGNOSIS — M791 Myalgia, unspecified site: Secondary | ICD-10-CM | POA: Diagnosis not present

## 2020-01-09 DIAGNOSIS — Z9221 Personal history of antineoplastic chemotherapy: Secondary | ICD-10-CM | POA: Diagnosis not present

## 2020-01-09 LAB — CBC WITH DIFFERENTIAL/PLATELET
Abs Immature Granulocytes: 0.01 10*3/uL (ref 0.00–0.07)
Basophils Absolute: 0 10*3/uL (ref 0.0–0.1)
Basophils Relative: 1 %
Eosinophils Absolute: 0.1 10*3/uL (ref 0.0–0.5)
Eosinophils Relative: 2 %
HCT: 31.7 % — ABNORMAL LOW (ref 36.0–46.0)
Hemoglobin: 11.1 g/dL — ABNORMAL LOW (ref 12.0–15.0)
Immature Granulocytes: 0 %
Lymphocytes Relative: 35 %
Lymphs Abs: 1.2 10*3/uL (ref 0.7–4.0)
MCH: 35 pg — ABNORMAL HIGH (ref 26.0–34.0)
MCHC: 35 g/dL (ref 30.0–36.0)
MCV: 100 fL (ref 80.0–100.0)
Monocytes Absolute: 0.6 10*3/uL (ref 0.1–1.0)
Monocytes Relative: 18 %
Neutro Abs: 1.5 10*3/uL — ABNORMAL LOW (ref 1.7–7.7)
Neutrophils Relative %: 44 %
Platelets: 103 10*3/uL — ABNORMAL LOW (ref 150–400)
RBC: 3.17 MIL/uL — ABNORMAL LOW (ref 3.87–5.11)
RDW: 15.5 % (ref 11.5–15.5)
WBC: 3.3 10*3/uL — ABNORMAL LOW (ref 4.0–10.5)
nRBC: 0 % (ref 0.0–0.2)

## 2020-01-09 LAB — BASIC METABOLIC PANEL
Anion gap: 10 (ref 5–15)
BUN: 15 mg/dL (ref 8–23)
CO2: 22 mmol/L (ref 22–32)
Calcium: 9 mg/dL (ref 8.9–10.3)
Chloride: 104 mmol/L (ref 98–111)
Creatinine, Ser: 0.85 mg/dL (ref 0.44–1.00)
GFR, Estimated: >60 mL/min (ref >60)
Glucose, Bld: 101 mg/dL — ABNORMAL HIGH (ref 70–99)
Potassium: 4 mmol/L (ref 3.5–5.1)
Sodium: 136 mmol/L (ref 135–145)

## 2020-01-11 ENCOUNTER — Other Ambulatory Visit: Payer: Self-pay

## 2020-01-11 DIAGNOSIS — C541 Malignant neoplasm of endometrium: Secondary | ICD-10-CM

## 2020-01-16 ENCOUNTER — Inpatient Hospital Stay (HOSPITAL_BASED_OUTPATIENT_CLINIC_OR_DEPARTMENT_OTHER): Payer: Medicare Other | Admitting: Internal Medicine

## 2020-01-16 ENCOUNTER — Inpatient Hospital Stay: Payer: Medicare Other

## 2020-01-16 ENCOUNTER — Other Ambulatory Visit: Payer: Self-pay

## 2020-01-16 DIAGNOSIS — Z5111 Encounter for antineoplastic chemotherapy: Secondary | ICD-10-CM | POA: Diagnosis not present

## 2020-01-16 DIAGNOSIS — C541 Malignant neoplasm of endometrium: Secondary | ICD-10-CM | POA: Diagnosis not present

## 2020-01-16 LAB — COMPREHENSIVE METABOLIC PANEL
ALT: 19 U/L (ref 0–44)
AST: 24 U/L (ref 15–41)
Albumin: 4.2 g/dL (ref 3.5–5.0)
Alkaline Phosphatase: 74 U/L (ref 38–126)
Anion gap: 5 (ref 5–15)
BUN: 14 mg/dL (ref 8–23)
CO2: 28 mmol/L (ref 22–32)
Calcium: 9.7 mg/dL (ref 8.9–10.3)
Chloride: 104 mmol/L (ref 98–111)
Creatinine, Ser: 0.85 mg/dL (ref 0.44–1.00)
GFR, Estimated: >60 mL/min (ref >60)
Glucose, Bld: 93 mg/dL (ref 70–99)
Potassium: 4 mmol/L (ref 3.5–5.1)
Sodium: 137 mmol/L (ref 135–145)
Total Bilirubin: 0.5 mg/dL (ref 0.3–1.2)
Total Protein: 8.2 g/dL — ABNORMAL HIGH (ref 6.5–8.1)

## 2020-01-16 LAB — CBC WITH DIFFERENTIAL/PLATELET
Abs Immature Granulocytes: 0.01 10*3/uL (ref 0.00–0.07)
Basophils Absolute: 0 10*3/uL (ref 0.0–0.1)
Basophils Relative: 1 %
Eosinophils Absolute: 0.1 10*3/uL (ref 0.0–0.5)
Eosinophils Relative: 2 %
HCT: 36.2 % (ref 36.0–46.0)
Hemoglobin: 12.7 g/dL (ref 12.0–15.0)
Immature Granulocytes: 0 %
Lymphocytes Relative: 30 %
Lymphs Abs: 0.9 10*3/uL (ref 0.7–4.0)
MCH: 35.5 pg — ABNORMAL HIGH (ref 26.0–34.0)
MCHC: 35.1 g/dL (ref 30.0–36.0)
MCV: 101.1 fL — ABNORMAL HIGH (ref 80.0–100.0)
Monocytes Absolute: 0.5 10*3/uL (ref 0.1–1.0)
Monocytes Relative: 16 %
Neutro Abs: 1.5 10*3/uL — ABNORMAL LOW (ref 1.7–7.7)
Neutrophils Relative %: 51 %
Platelets: 71 10*3/uL — ABNORMAL LOW (ref 150–400)
RBC: 3.58 MIL/uL — ABNORMAL LOW (ref 3.87–5.11)
RDW: 14.8 % (ref 11.5–15.5)
WBC: 3 10*3/uL — ABNORMAL LOW (ref 4.0–10.5)
nRBC: 0 % (ref 0.0–0.2)

## 2020-01-16 NOTE — Progress Notes (Unsigned)
[facility] CONSULT NOTE  Patient Care Team: Jeanette Berry, NP as PCP - General (Nurse Practitioner) Jeanette Jacks, RN as Oncology Nurse Navigator  CHIEF COMPLAINTS/PURPOSE OF CONSULTATION: high grade serous cancer   #  Oncology History Overview Note  #August 2021-ascitic fluid cytology-high-grade serous carcinoma of gynecologic origin; CT scan-behind the abdominal wall 4.6 x 2.1 mass ; within the mesentery 6.7 x 4.5 cm left of mid abdomen . BIOPSY of the peritoneal mass; high grade carcinoma [phone] [Dr.Byrnett]; SEP 9th 2021- PET scan-shows peritoneal carcinomatosis; uterine uptake [Dr.Secord; unable to biopsy cervical stenosis] sigmoid colonic uptake concerning for malignancy.  No ovarian uptake.  CEA 125 +1300; CEA-2.    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 GENETICS- NEG; HRD-    # PALLIATIVE CARE EVALUATION:  # PAIN MANAGEMENT:    DIAGNOSIS: Uterine cancer  STAGE:   IV      ;  GOALS: control  CURRENT/MOST RECENT THERAPY : Carbotaxol    Uterine cancer ([facility]) (Resolved)  09/04/2019 Initial Diagnosis   Gynecologic cancer [facility])   Endometrial cancer ([facility])  09/14/2019 -  Chemotherapy   The patient had dexamethasone (DECADRON) 4 MG tablet, 8 mg, Oral, Daily, 1 of 1 cycle, Start date: --, End date: -- palonosetron (ALOXI) injection 0.25 mg, 0.25 mg, Intravenous,  Once, 5 of 9 cycles Administration: 0.25 mg (09/14/2019), 0.25 mg (10/05/2019), 0.25 mg (10/26/2019), 0.25 mg (11/16/2019), 0.25 mg (12/26/2019) CARBOplatin (PARAPLATIN) 580 mg in sodium chloride 0.9 %  250 mL chemo infusion, 580 mg (100 % of original dose 577.8 mg), Intravenous,  Once, 5 of 9 cycles Dose modification:   (original dose 577.8 mg, Cycle 1) Administration: 580 mg (09/14/2019), 540 mg (10/05/2019), 540 mg (10/26/2019), 540 mg (11/16/2019), 540 mg (12/26/2019) fosaprepitant (EMEND) 150 mg in sodium chloride 0.9 % 145 mL IVPB, 150 mg, Intravenous,  Once, 5 of 9 cycles Administration: 150 mg (09/14/2019), 150 mg (10/05/2019), 150 mg (10/26/2019), 150 mg (11/16/2019), 150 mg (12/26/2019) PACLitaxel (TAXOL) 318 mg in sodium chloride 0.9 % 500 mL chemo infusion (> 4m/m2), 175 mg/m2 = 318 mg, Intravenous,  Once, 5 of 9 cycles Administration: 318 mg (09/14/2019), 318 mg (10/05/2019), 318 mg (10/26/2019), 318 mg (11/16/2019), 318 mg (12/26/2019)  for chemotherapy treatment.    10/05/2019 Initial Diagnosis   Endometrial cancer (H[facility])    Genetic Testing   Negative germline genetic testing. No pathogenic variants [facility]. The report date is 10/01/2019. HRD testing ([employer]) still pending.   The [facility] panel offered by [facility] sequencing and deletion/duplication testing of the following 35 genes: APC, ATM, AXIN2, BARD1, BMPR1A, BRCA1, BRCA2, BRIP1, CHD1, CDK4, CDKN2A, CHEK2, EPCAM (large rearrangement only), HOXB13, GALNT12, MLH1, MSH2, MSH3, MSH6, MUTYH, NBN, NTHL1, PALB2, PMS2, PTEN, RAD51C, RAD51D, RNF43, RPS20, SMAD4, STK11, and TP53. Sequencing was performed for select regions of POLE and POLD1, and large rearrangement analysis was performed for select regions of GREM1.    01/16/2020 Cancer Staging   Staging form: Corpus Uteri - Carcinoma and Carcinosarcoma, AJCC 8th Edition - Clinical: Stage IVB (cM1) - Signed by BCammie Sickle MD on 01/16/2020      HISTORY OF PRESENTING ILLNESS:  [name]89y.[id number].  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  Patient leg cramps improved on prednisone postchemotherapy.  No nausea no vomiting.  Positive for constipation.  No blood in stools or black stools.  No significant clinical numbness extremities.  Review of Systems  Constitutional: Positive for malaise/fatigue. Negative for chills, diaphoresis, fever and weight loss.  HENT: Negative for nosebleeds and sore throat.   Eyes: Negative for double vision.  Respiratory: Negative for cough, hemoptysis, sputum production, shortness of breath and wheezing.   Cardiovascular: Negative for chest pain, palpitations, orthopnea and leg swelling.  Gastrointestinal: Positive for constipation. Negative for blood in stool, diarrhea, heartburn, melena and vomiting.  Genitourinary: Negative for dysuria, frequency and urgency.  Musculoskeletal: Positive for myalgias. Negative for back pain and joint pain.  Skin: Negative.  Negative for itching and rash.  Neurological: Negative for dizziness, tingling, focal weakness, weakness and headaches.  Endo/Heme/Allergies: Does not bruise/bleed easily.  Psychiatric/Behavioral: Negative for depression. The patient is not nervous/anxious and does not have insomnia.      Jeanette HISTORY:  Past Jeanette History:  Diagnosis Date  . Arthritis   . Cancer ([place])   . Hypertension     SURGICAL HISTORY: Past Surgical History:  Procedure Laterality Date  . APPENDECTOMY    . COLONOSCOPY WITH PROPOFOL N/A 10/01/2019   Procedure: COLONOSCOPY WITH PROPOFOL;  Surgeon: Lin Landsman, MD;  [facility]: [facility];  Service: Gastroenterology;  Laterality: N/A;  . ESOPHAGOGASTRODUODENOSCOPY (EGD) WITH PROPOFOL N/A 09/12/2019   Procedure: ESOPHAGOGASTRODUODENOSCOPY (EGD) WITH PROPOFOL;  Surgeon: Lin Landsman, MD;  Location: [facility];  Service: Gastroenterology;  Laterality: N/A;    SOCIAL HISTORY: Social History   Socioeconomic History  . Marital status: Married    Spouse name: Not  on file  . Number of children: Not on file  . Years of education: Not on file  . Highest education level: Not on file  Occupational History  . Not on file  Tobacco Use  . Smoking status: Current Every Day Smoker  . Smokeless tobacco: Never Used  Vaping Use  . Vaping Use: Never used  Substance and Sexual Activity  . Alcohol use: Not Currently  . Drug use: Never  . Sexual activity: Yes  Other Topics Concern  . Not on file  Social History Narrative   Lives with husband; near [place]. Worked Network engineer at [employer]; smoke 1/2 ppd/slowed; no alcohol; 3 children [2 boys; one girl- alliance Jeanette]   Social Determinants of Health   Financial Resource Strain: Not on file  Food Insecurity: Not on file  Transportation Needs: Not on file  Physical Activity: Not on file  Stress: Not on file  Social Connections: Not on file  Intimate Partner Violence: Not on file    FAMILY HISTORY: Family History  Problem Relation Age of Onset  . Diabetes Maternal Grandmother   . Hypertension Maternal Grandmother   . Stroke Maternal Grandmother     ALLERGIES:  has No Known Allergies.  MEDICATIONS:  Current Outpatient Medications  Medication Sig Dispense Refill  . acetaminophen (TYLENOL) 500 MG tablet Take by mouth.    . ergocalciferol (VITAMIN D2) 1.25 MG (50000 UT) capsule Take by mouth.    Marland Kitchen FLUoxetine (PROZAC) 20 MG capsule Take by mouth.    Marland Kitchen LORazepam (ATIVAN) 0.5 MG tablet Take 1-2 pill at night for anxiety/nausea. 60 tablet 0  . losartan (COZAAR) 50 MG tablet Take by mouth.    Marland Kitchen omeprazole (PRILOSEC)  40 MG capsule Take 1 capsule (40 mg total) by mouth 2 (two) times daily before a meal. (Patient taking differently: Take 40 mg by mouth daily.) 30 capsule 3  . ondansetron (ZOFRAN) 8 MG tablet One pill every 8 hours as needed for nausea/vomitting. 60 tablet 1  . polyethylene glycol powder (GLYCOLAX/MIRALAX) 17 GM/SCOOP powder Take by mouth.    . predniSONE (DELTASONE) 10 MG tablet Take 1  pill a day x 5 days; start the day after chemo. Take in AM/wiith Breakfast. 20 tablet 0  . prochlorperazine (COMPAZINE) 10 MG tablet Take 1 tablet (10 mg total) by mouth every 6 (six) hours as needed for nausea or vomiting. 40 tablet 1  . ondansetron (ZOFRAN-ODT) 4 MG disintegrating tablet Take 4 mg by mouth every 8 (eight) hours as needed for nausea or vomiting.  (Patient not taking: Reported on 01/16/2020)    . Pitavastatin Calcium (LIVALO) 4 MG TABS Take 4 mg by mouth daily. (Patient not taking: No sig reported)    . traMADol (ULTRAM) 50 MG tablet Take 1 tablet (50 mg total) by mouth every 6 (six) hours as needed for severe pain. (Patient not taking: No sig reported) 15 tablet 0   No current facility-administered medications for this visit.      Marland Kitchen  PHYSICAL EXAMINATION: ECOG PERFORMANCE STATUS: 1 - Symptomatic but completely ambulatory  Vitals:   01/16/20 0911  BP: 134/82  Pulse: 75  Resp: 18  Temp: (!) 97.1 F (36.2 C)  SpO2: 99%   Filed Weights   01/16/20 0911  Weight: 152 lb (68.9 kg)    Physical Exam Constitutional:      Comments: Ambulating independently.  Accompanied by husband.  HENT:     Head: Normocephalic and atraumatic.     Mouth/Throat:     Pharynx: No oropharyngeal exudate.  Eyes:     Pupils: Pupils are equal, round, and reactive to light.  Cardiovascular:     Rate and Rhythm: Normal rate and regular rhythm.  Pulmonary:     Effort: Pulmonary effort is normal. No respiratory distress.     Breath sounds: Normal breath sounds. No wheezing.  Abdominal:     General: Bowel sounds are normal. There is distension.     Palpations: Abdomen is soft. There is no mass.     Tenderness: There is no abdominal tenderness. There is no guarding or rebound.  Musculoskeletal:        General: No tenderness. Normal range of motion.     Cervical back: Normal range of motion and neck supple.  Skin:    General: Skin is warm.  Neurological:     Mental Status: She is alert and  oriented to person, place, and time.  Psychiatric:        Mood and Affect: Affect normal.      LABORATORY DATA:  I have reviewed the data as listed Lab Results  Component Value Date   WBC 3.0 (L) 01/16/2020   HGB 12.7 01/16/2020   HCT 36.2 01/16/2020   MCV 101.1 (H) 01/16/2020   PLT 71 (L) 01/16/2020   Recent Labs    09/14/19 0813 09/28/19 1006 10/05/19 0807 10/15/19 1107 12/07/19 1318 12/26/19 0804 01/09/20 1414 01/16/20 0841  NA 139 138 140   < > 138 137 136 137  K 4.0 4.0 4.3   < > 3.9 4.0 4.0 4.0  CL 104 104 104   < > 104 104 104 104  CO2 _0 < >  _0 GLUCOSE 104* 106* 108*   < > 123* 94 101* 93  BUN _1 < > _2 CREATININE 0.93 0.73 0.86   < > 0.81 0.86 0.85 0.85  CALCIUM 9.2 9.0 9.9   < > 9.3 9.4 9.0 9.7  GFRNONAA >60 >60 >60   < > >60 >60 >60 >60  GFRAA >60 >60 >60  --   --   --   --   --   PROT 7.5  --  7.6   < > 7.7 7.9  --  8.2*  ALBUMIN 3.5  --  4.0   < > 3.9 4.1  --  4.2  AST 30  --  37   < > 58* 28  --  24  ALT 21  --  30   < > 54* 28  --  19  ALKPHOS 71  --  72   < > 75 75  --  74  BILITOT 0.5  --  0.5   < > 0.6 0.5  --  0.5   < > = values in this interval not displayed.    RADIOGRAPHIC STUDIES: I have personally reviewed the radiological images as listed and agreed with the findings in the report. No results found.  ASSESSMENT & PLAN:   Endometrial cancer ([place]) # STAGE IV-High-grade serous carcinoma of gynecologic origin [likley endometrial] pre-treatment-CEA 125 +1500;CT Nov 9th- Partial reponse noted on CT scan.  CEA- improving. Plan #7 chemos prior to resection.   # HOLD cycle #6 of carbotaxol chemotherapy today; platelets 70 today.  We will initiate chemotherapy next week. ?re-staging with PET scan after cycle #7. Will discuss with gyn-Onc.    # Myalgias-likely secondary to Taxol.  Previously improved with ?  Prednisone 10 mg/day x5 days; post chemo.   # Nausea- Anxiety- STABLE;  continue ativan prn.  #  Constipation- on stool softner; recommend miralax.   #Status post COVID vaccine-counseled with regards to booster; in agreement.   # DISPOSITION: # HOLD chemo today # 1 week- labs- cbc/bmp; carbo-Taxol # Follow up in 4 weeks- MD; labs- CBC/CMP/ca-125;cabo-taxol;  Dr.B    All questions were answered. The patient knows to call the clinic with any problems, questions or concerns.   Cammie Sickle, MD 01/17/2020 7:56 AM

## 2020-01-16 NOTE — Assessment & Plan Note (Addendum)
#   STAGE IV-High-grade serous carcinoma of gynecologic origin [facility] endometrial] pre-treatment-CEA 125 +1500;CT Nov 9th- Partial reponse noted on CT scan.  CEA- improving. Plan #7 chemos prior to resection.   # HOLD cycle #6 of carbotaxol chemotherapy today; platelets 70 today.  We will initiate chemotherapy next week. ?re-staging with PET scan after cycle #7. Will discuss with gyn-Onc.    # Myalgias-likely secondary to Taxol.  Previously improved with ?  Prednisone 10 mg/day x5 days; post chemo.   # Nausea- Anxiety- STABLE;  continue ativan prn.  # Constipation- on stool softner; recommend miralax.   #Status post COVID vaccine-counseled with regards to booster; in agreement.   # DISPOSITION: # HOLD chemo today # 1 week- labs- cbc/bmp; carbo-Taxol # Follow up in 4 weeks- MD; labs- CBC/CMP/ca-125;cabo-taxol;  Dr.B

## 2020-01-17 LAB — CA 125: Cancer Antigen (CA) 125: 72.3 U/mL — ABNORMAL HIGH (ref 0.0–38.1)

## 2020-01-23 ENCOUNTER — Encounter: Payer: Self-pay | Admitting: Internal Medicine

## 2020-01-23 ENCOUNTER — Inpatient Hospital Stay: Payer: Medicare Other

## 2020-01-23 ENCOUNTER — Other Ambulatory Visit: Payer: Self-pay

## 2020-01-23 ENCOUNTER — Inpatient Hospital Stay (HOSPITAL_BASED_OUTPATIENT_CLINIC_OR_DEPARTMENT_OTHER): Payer: Medicare Other | Admitting: Internal Medicine

## 2020-01-23 VITALS — BP 151/94 | HR 80 | Temp 96.4°F | Resp 18 | Ht 64.0 in | Wt 152.0 lb

## 2020-01-23 DIAGNOSIS — C541 Malignant neoplasm of endometrium: Secondary | ICD-10-CM | POA: Diagnosis not present

## 2020-01-23 DIAGNOSIS — D696 Thrombocytopenia, unspecified: Secondary | ICD-10-CM | POA: Diagnosis not present

## 2020-01-23 DIAGNOSIS — Z5111 Encounter for antineoplastic chemotherapy: Secondary | ICD-10-CM | POA: Diagnosis not present

## 2020-01-23 LAB — CBC WITH DIFFERENTIAL/PLATELET
Abs Immature Granulocytes: 0.01 10*3/uL (ref 0.00–0.07)
Basophils Absolute: 0 10*3/uL (ref 0.0–0.1)
Basophils Relative: 0 %
Eosinophils Absolute: 0.2 10*3/uL (ref 0.0–0.5)
Eosinophils Relative: 4 %
HCT: 35.3 % — ABNORMAL LOW (ref 36.0–46.0)
Hemoglobin: 12.6 g/dL (ref 12.0–15.0)
Immature Granulocytes: 0 %
Lymphocytes Relative: 27 %
Lymphs Abs: 1 10*3/uL (ref 0.7–4.0)
MCH: 36.1 pg — ABNORMAL HIGH (ref 26.0–34.0)
MCHC: 35.7 g/dL (ref 30.0–36.0)
MCV: 101.1 fL — ABNORMAL HIGH (ref 80.0–100.0)
Monocytes Absolute: 0.3 10*3/uL (ref 0.1–1.0)
Monocytes Relative: 8 %
Neutro Abs: 2.3 10*3/uL (ref 1.7–7.7)
Neutrophils Relative %: 61 %
Platelets: 56 10*3/uL — ABNORMAL LOW (ref 150–400)
RBC: 3.49 MIL/uL — ABNORMAL LOW (ref 3.87–5.11)
RDW: 14.2 % (ref 11.5–15.5)
WBC: 3.8 10*3/uL — ABNORMAL LOW (ref 4.0–10.5)
nRBC: 0 % (ref 0.0–0.2)

## 2020-01-23 LAB — TECHNOLOGIST SMEAR REVIEW
Plt Morphology: NORMAL
RBC Morphology: NORMAL
WBC Morphology: NORMAL

## 2020-01-23 LAB — BASIC METABOLIC PANEL
Anion gap: 8 (ref 5–15)
BUN: 17 mg/dL (ref 8–23)
CO2: 24 mmol/L (ref 22–32)
Calcium: 9.4 mg/dL (ref 8.9–10.3)
Chloride: 105 mmol/L (ref 98–111)
Creatinine, Ser: 0.84 mg/dL (ref 0.44–1.00)
GFR, Estimated: >60 mL/min (ref >60)
Glucose, Bld: 96 mg/dL (ref 70–99)
Potassium: 4.1 mmol/L (ref 3.5–5.1)
Sodium: 137 mmol/L (ref 135–145)

## 2020-01-23 LAB — IMMATURE PLATELET FRACTION: Immature Platelet Fraction: 4.5 % (ref 1.2–8.6)

## 2020-01-23 MED ORDER — PREDNISONE 20 MG PO TABS: 60.0000 mg | ORAL_TABLET | Freq: Every day | ORAL | 0 refills | Status: DC

## 2020-01-23 NOTE — Progress Notes (Signed)
[facility] CONSULT NOTE  Patient Care Team: Danelle Berry, NP as PCP - General (Nurse Practitioner) Clent Jacks, RN as Oncology Nurse Navigator  CHIEF COMPLAINTS/PURPOSE OF CONSULTATION: high grade serous cancer   #  Oncology History Overview Note  #August 2021-ascitic fluid cytology-high-grade serous carcinoma of gynecologic origin; CT scan-behind the abdominal wall 4.6 x 2.1 mass ; within the mesentery 6.7 x 4.5 cm left of mid abdomen . BIOPSY of the peritoneal mass; high grade carcinoma [phone] [Dr.Byrnett]; SEP 9th 2021- PET scan-shows peritoneal carcinomatosis; uterine uptake [Dr.Secord; unable to biopsy cervical stenosis] sigmoid colonic uptake concerning for malignancy.  No ovarian uptake.  CEA 125 +1300; CEA-2.    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 GENETICS- NEG; HRD-    # PALLIATIVE CARE EVALUATION:  # PAIN MANAGEMENT:    DIAGNOSIS: Uterine cancer  STAGE:   IV      ;  GOALS: control  CURRENT/MOST RECENT THERAPY : Carbotaxol    Uterine cancer ([place]) (Resolved)  09/04/2019 Initial Diagnosis   Gynecologic cancer [facility])   Endometrial cancer ([place])  09/14/2019 -  Chemotherapy   The patient had dexamethasone (DECADRON) 4 MG tablet, 8 mg, Oral, Daily, 1 of 1 cycle, Start date: --, End date: -- palonosetron (ALOXI) injection 0.25 mg, 0.25 mg, Intravenous,  Once, 5 of 9 cycles Administration: 0.25 mg (09/14/2019), 0.25 mg (10/05/2019), 0.25 mg (10/26/2019), 0.25 mg (11/16/2019), 0.25 mg (12/26/2019) CARBOplatin (PARAPLATIN) 580 mg in sodium chloride 0.9 %  250 mL chemo infusion, 580 mg (100 % of original dose 577.8 mg), Intravenous,  Once, 5 of 9 cycles Dose modification:   (original dose 577.8 mg, Cycle 1) Administration: 580 mg (09/14/2019), 540 mg (10/05/2019), 540 mg (10/26/2019), 540 mg (11/16/2019), 540 mg (12/26/2019) fosaprepitant (EMEND) 150 mg in sodium chloride 0.9 % 145 mL IVPB, 150 mg, Intravenous,  Once, 5 of 9 cycles Administration: 150 mg (09/14/2019), 150 mg (10/05/2019), 150 mg (10/26/2019), 150 mg (11/16/2019), 150 mg (12/26/2019) PACLitaxel (TAXOL) 318 mg in sodium chloride 0.9 % 500 mL chemo infusion (> 36m/m2), 175 mg/m2 = 318 mg, Intravenous,  Once, 5 of 9 cycles Administration: 318 mg (09/14/2019), 318 mg (10/05/2019), 318 mg (10/26/2019), 318 mg (11/16/2019), 318 mg (12/26/2019)  for chemotherapy treatment.    10/05/2019 Initial Diagnosis   Endometrial cancer (HCC)    Genetic Testing   Negative germline genetic testing. No pathogenic variants identified on the [facility]. The report date is 10/01/2019. HRD testing ([employer]) still pending.   The [facility] panel offered by [employer] [employer]includes sequencing and deletion/duplication testing of the following 35 genes: APC, ATM, AXIN2, BARD1, BMPR1A, BRCA1, BRCA2, BRIP1, CHD1, CDK4, CDKN2A, CHEK2, EPCAM (large rearrangement only), HOXB13, GALNT12, MLH1, MSH2, MSH3, MSH6, MUTYH, NBN, NTHL1, PALB2, PMS2, PTEN, RAD51C, RAD51D, RNF43, RPS20, SMAD4, STK11, and TP53. Sequencing was performed for select regions of POLE and POLD1, and large rearrangement analysis was performed for select regions of GREM1.    01/16/2020 Cancer Staging   Staging form: Corpus Uteri - Carcinoma and Carcinosarcoma, AJCC 8th Edition - Clinical: Stage IVB (cM1) - Signed by BCammie Sickle MD on 01/16/2020      HISTORY OF PRESENTING ILLNESS:  Jeanette [id number].  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  Patient's chemotherapy cycle #6 was held 1 week ago because of low platelets 71.  However today platelets are lower at 56.  She is here for further evaluation.  Constipation is improved while taking MiraLAX.  Otherwise no blood in stools black or stools.  No significant tingling and numbness in the extremities.  Also in the interim patient received a booster injection for the COVID.  Review of Systems  Constitutional: Positive for malaise/fatigue. Negative for chills, diaphoresis, fever and weight loss.  HENT: Negative for nosebleeds and sore throat.   Eyes: Negative for double vision.  Respiratory: Negative for cough, hemoptysis, sputum production, shortness of breath and wheezing.   Cardiovascular: Negative for chest pain, palpitations, orthopnea and leg swelling.  Gastrointestinal: Positive for constipation. Negative for blood in stool, diarrhea, heartburn, melena and vomiting.  Genitourinary: Negative for dysuria, frequency and urgency.  Musculoskeletal: Positive for myalgias. Negative for back pain and joint pain.  Skin: Negative.  Negative for itching and rash.  Neurological: Negative for dizziness, tingling, focal weakness, weakness and headaches.  Endo/Heme/Allergies: Does not bruise/bleed easily.  Psychiatric/Behavioral: Negative for depression. The patient is not nervous/anxious and does not have insomnia.      MEDICAL HISTORY:  Past Medical History:  Diagnosis Date  . Arthritis   . Cancer ([place])   . Hypertension     SURGICAL HISTORY: Past Surgical History:  Procedure Laterality Date  . APPENDECTOMY    . COLONOSCOPY WITH PROPOFOL N/A 10/01/2019   Procedure: COLONOSCOPY WITH PROPOFOL;  Surgeon: Lin Landsman, MD;  Location: [facility] ENDOSCOPY;  Service: Gastroenterology;  Laterality: N/A;  . ESOPHAGOGASTRODUODENOSCOPY (EGD) WITH PROPOFOL N/A 09/12/2019   Procedure: ESOPHAGOGASTRODUODENOSCOPY (EGD) WITH PROPOFOL;  Surgeon: Lin Landsman,  MD;  Location: [facility] ENDOSCOPY;  Service: Gastroenterology;  Laterality: N/A;    SOCIAL HISTORY: Social History   Socioeconomic History  . Marital status: Married    Spouse name: Not on file  . Number of children: Not on file  . Years of education: Not on file  . Highest education level: Not on file  Occupational History  . Not on file  Tobacco Use  . Smoking status: Current Every Day Smoker  . Smokeless tobacco: Never Used  Vaping Use  . Vaping Use: Never used  Substance and Sexual Activity  . Alcohol use: Not Currently  . Drug use: Never  . Sexual activity: Yes  Other Topics Concern  . Not on file  Social History Narrative   Lives with husband; near [place]. Worked Network engineer at [employer]; smoke 1/2 ppd/slowed; no alcohol; 3 children [2 boys; one girl- [employer]]   Social Determinants of Health   Financial Resource Strain: Not on file  Food Insecurity: Not on file  Transportation Needs: Not on file  Physical Activity: Not on file  Stress: Not on file  Social Connections: Not on file  Intimate Partner Violence: Not on file    FAMILY HISTORY: Family History  Problem Relation Age of Onset  . Diabetes Maternal Grandmother   . Hypertension Maternal Grandmother   . Stroke Maternal Grandmother     ALLERGIES:  has No Known Allergies.  MEDICATIONS:  Current Outpatient Medications  Medication Sig Dispense Refill  . acetaminophen (TYLENOL) 500 MG tablet Take by mouth.    . ergocalciferol (VITAMIN D2) 1.25 MG (50000 UT) capsule Take by mouth.    Marland Kitchen FLUoxetine (PROZAC) 20 MG capsule  Take by mouth.    Marland Kitchen LORazepam (ATIVAN) 0.5 MG tablet Take 1-2 pill at night for anxiety/nausea. 60 tablet 0  . losartan (COZAAR) 50 MG tablet Take by mouth.    Marland Kitchen omeprazole (PRILOSEC) 40 MG capsule Take 1 capsule (40 mg total) by mouth 2 (two) times daily before a meal. (Patient taking differently: Take 40 mg by mouth daily.) 30 capsule 3  . ondansetron (ZOFRAN) 8 MG tablet One pill  every 8 hours as needed for nausea/vomitting. 60 tablet 1  . ondansetron (ZOFRAN-ODT) 4 MG disintegrating tablet Take 4 mg by mouth every 8 (eight) hours as needed for nausea or vomiting.    . polyethylene glycol powder (GLYCOLAX/MIRALAX) 17 GM/SCOOP powder Take by mouth.    . predniSONE (DELTASONE) 20 MG tablet Take 3 tablets (60 mg total) by mouth daily with breakfast. In AM with food. 40 tablet 0  . Pitavastatin Calcium (LIVALO) 4 MG TABS Take 4 mg by mouth daily. (Patient not taking: No sig reported)    . predniSONE (DELTASONE) 10 MG tablet Take 1 pill a day x 5 days; start the day after chemo. Take in AM/wiith Breakfast. (Patient not taking: Reported on 01/23/2020) 20 tablet 0  . prochlorperazine (COMPAZINE) 10 MG tablet Take 1 tablet (10 mg total) by mouth every 6 (six) hours as needed for nausea or vomiting. (Patient not taking: Reported on 01/23/2020) 40 tablet 1  . traMADol (ULTRAM) 50 MG tablet Take 1 tablet (50 mg total) by mouth every 6 (six) hours as needed for severe pain. (Patient not taking: No sig reported) 15 tablet 0   No current facility-administered medications for this visit.      Marland Kitchen  PHYSICAL EXAMINATION: ECOG PERFORMANCE STATUS: 1 - Symptomatic but completely ambulatory  Vitals:   01/23/20 0845  BP: (!) 151/94  Pulse: 80  Resp: 18  Temp: (!) 96.4 F (35.8 C)   Filed Weights   01/23/20 0845  Weight: 152 lb (68.9 kg)    Physical Exam Constitutional:      Comments: Ambulating independently.  Accompanied by husband.  HENT:     Head: Normocephalic and atraumatic.     Mouth/Throat:     Pharynx: No oropharyngeal exudate.  Eyes:     Pupils: Pupils are equal, round, and reactive to light.  Cardiovascular:     Rate and Rhythm: Normal rate and regular rhythm.  Pulmonary:     Effort: Pulmonary effort is normal. No respiratory distress.     Breath sounds: Normal breath sounds. No wheezing.  Abdominal:     General: Bowel sounds are normal. There is distension.      Palpations: Abdomen is soft. There is no mass.     Tenderness: There is no abdominal tenderness. There is no guarding or rebound.  Musculoskeletal:        General: No tenderness. Normal range of motion.     Cervical back: Normal range of motion and neck supple.  Skin:    General: Skin is warm.  Neurological:     Mental Status: She is alert and oriented to person, place, and time.  Psychiatric:        Mood and Affect: Affect normal.      LABORATORY DATA:  I have reviewed the data as listed Lab Results  Component Value Date   WBC 3.8 (L) 01/23/2020   HGB 12.6 01/23/2020   HCT 35.3 (L) 01/23/2020   MCV 101.1 (H) 01/23/2020   PLT 56 (L) 01/23/2020   Recent Labs  09/14/19 0813 09/28/19 1006 10/05/19 0807 10/15/19 1107 12/07/19 1318 12/26/19 0804 01/09/20 1414 01/16/20 0841 01/23/20 0800  NA 139 138 140   < > 138 137 136 137 137  K 4.0 4.0 4.3   < > 3.9 4.0 4.0 4.0 4.1  CL 104 104 104   < > 104 104 104 104 105  CO2 _0 < > _1 GLUCOSE 104* 106* 108*   < > 123* 94 101* 93 96  BUN _2 < > _3 CREATININE 0.93 0.73 0.86   < > 0.81 0.86 0.85 0.85 0.84  CALCIUM 9.2 9.0 9.9   < > 9.3 9.4 9.0 9.7 9.4  GFRNONAA >60 >60 >60   < > >60 >60 >60 >60 >60  GFRAA >60 >60 >60  --   --   --   --   --   --   PROT 7.5  --  7.6   < > 7.7 7.9  --  8.2*  --   ALBUMIN 3.5  --  4.0   < > 3.9 4.1  --  4.2  --   AST 30  --  37   < > 58* 28  --  24  --   ALT 21  --  30   < > 54* 28  --  19  --   ALKPHOS 71  --  72   < > 75 75  --  74  --   BILITOT 0.5  --  0.5   < > 0.6 0.5  --  0.5  --    < > = values in this interval not displayed.    RADIOGRAPHIC STUDIES: I have personally reviewed the radiological images as listed and agreed with the findings in the report. No results found.  ASSESSMENT & PLAN:   Endometrial cancer ([place]) # STAGE IV-High-grade serous carcinoma of gynecologic origin [likley endometrial] pre-treatment-CEA 125 +1500;CT Nov 9th-  Partial reponse noted on CT scan.  CEA- improving. Plan #7 chemos prior to resection.   # HOLD cycle #6 of carbotaxol chemotherapy today; platelets 56 today [see below]?re-staging with PET scan after cycle #7. Will discuss with gyn-Onc.    #Thrombocytopenia-worsening [on the last chemotherapy-approximately 4 weeks ago]- ? Etiology-bone marrow suppression from chemotoxicity versus ITP.  Trial of prednisone reassess next week for chemotherapy.  Check immature platelet fraction; peripheral smear.  No other medications/alcohol.  # Myalgias-likely secondary to Taxol. STABLE.   # Nausea- Anxiety- STABLE;  continue ativan prn.  # Constipation- on stool softner; stable on miralax.   #Status post COVID vaccine-s/p  Booster.   # DISPOSITION: # HOLD chemo today # follow up on  Labs 1/25; MD- cbc/bmp; ; carbo-Taxol- Dr.B    All questions were answered. The patient knows to call the clinic with any problems, questions or concerns.   Cammie Sickle, MD 01/23/2020 11:49 AM

## 2020-01-23 NOTE — Assessment & Plan Note (Addendum)
#   STAGE IV-High-grade serous carcinoma of gynecologic origin [facility] endometrial] pre-treatment-CEA 125 +1500;CT Nov 9th- Partial reponse noted on CT scan.  CEA- improving. Plan #7 chemos prior to resection.   # HOLD cycle #6 of carbotaxol chemotherapy today; platelets 56 today [see below]?re-staging with PET scan after cycle #7. Will discuss with gyn-Onc.    #Thrombocytopenia-worsening [on the last chemotherapy-approximately 4 weeks ago]- ? Etiology-bone marrow suppression from chemotoxicity versus ITP.  Trial of prednisone reassess next week for chemotherapy.  Check immature platelet fraction; peripheral smear.  No other medications/alcohol.  # Myalgias-likely secondary to Taxol. STABLE.   # Nausea- Anxiety- STABLE;  continue ativan prn.  # Constipation- on stool softner; stable on miralax.   #Status post COVID vaccine-s/p  Booster.   # DISPOSITION: # HOLD chemo today # follow up on  Labs 1/25; MD- cbc/bmp; ; carbo-Taxol- Dr.B

## 2020-01-29 ENCOUNTER — Inpatient Hospital Stay: Payer: Medicare Other

## 2020-01-29 ENCOUNTER — Encounter: Payer: Self-pay | Admitting: Internal Medicine

## 2020-01-29 ENCOUNTER — Inpatient Hospital Stay (HOSPITAL_BASED_OUTPATIENT_CLINIC_OR_DEPARTMENT_OTHER): Payer: Medicare Other | Admitting: Internal Medicine

## 2020-01-29 DIAGNOSIS — C541 Malignant neoplasm of endometrium: Secondary | ICD-10-CM

## 2020-01-29 DIAGNOSIS — Z5111 Encounter for antineoplastic chemotherapy: Secondary | ICD-10-CM | POA: Diagnosis not present

## 2020-01-29 DIAGNOSIS — Z7189 Other specified counseling: Secondary | ICD-10-CM

## 2020-01-29 LAB — CBC WITH DIFFERENTIAL/PLATELET
Abs Immature Granulocytes: 0.02 10*3/uL (ref 0.00–0.07)
Basophils Absolute: 0 10*3/uL (ref 0.0–0.1)
Basophils Relative: 0 %
Eosinophils Absolute: 0 10*3/uL (ref 0.0–0.5)
Eosinophils Relative: 1 %
HCT: 35.2 % — ABNORMAL LOW (ref 36.0–46.0)
Hemoglobin: 12.1 g/dL (ref 12.0–15.0)
Immature Granulocytes: 1 %
Lymphocytes Relative: 24 %
Lymphs Abs: 1.1 10*3/uL (ref 0.7–4.0)
MCH: 35.5 pg — ABNORMAL HIGH (ref 26.0–34.0)
MCHC: 34.4 g/dL (ref 30.0–36.0)
MCV: 103.2 fL — ABNORMAL HIGH (ref 80.0–100.0)
Monocytes Absolute: 0.3 10*3/uL (ref 0.1–1.0)
Monocytes Relative: 8 %
Neutro Abs: 3 10*3/uL (ref 1.7–7.7)
Neutrophils Relative %: 66 %
Platelets: 83 10*3/uL — ABNORMAL LOW (ref 150–400)
RBC: 3.41 MIL/uL — ABNORMAL LOW (ref 3.87–5.11)
RDW: 14.7 % (ref 11.5–15.5)
WBC: 4.4 10*3/uL (ref 4.0–10.5)
nRBC: 0 % (ref 0.0–0.2)

## 2020-01-29 LAB — BASIC METABOLIC PANEL
Anion gap: 9 (ref 5–15)
BUN: 19 mg/dL (ref 8–23)
CO2: 25 mmol/L (ref 22–32)
Calcium: 9.7 mg/dL (ref 8.9–10.3)
Chloride: 104 mmol/L (ref 98–111)
Creatinine, Ser: 0.88 mg/dL (ref 0.44–1.00)
GFR, Estimated: >60 mL/min (ref >60)
Glucose, Bld: 100 mg/dL — ABNORMAL HIGH (ref 70–99)
Potassium: 3.3 mmol/L — ABNORMAL LOW (ref 3.5–5.1)
Sodium: 138 mmol/L (ref 135–145)

## 2020-01-29 MED ORDER — SODIUM CHLORIDE 0.9 % IV SOLN
Freq: Once | INTRAVENOUS | Status: AC
  Filled 2020-01-29: qty 250

## 2020-01-29 MED ORDER — SODIUM CHLORIDE 0.9 % IV SOLN
452.0000 mg | Freq: Once | INTRAVENOUS | Status: AC
  Administered 2020-01-29: 450 mg via INTRAVENOUS
  Filled 2020-01-29: qty 45

## 2020-01-29 MED ORDER — SODIUM CHLORIDE 0.9 % IV SOLN
150.0000 mg | Freq: Once | INTRAVENOUS | Status: AC
  Administered 2020-01-29: 150 mg via INTRAVENOUS
  Filled 2020-01-29: qty 150

## 2020-01-29 MED ORDER — SODIUM CHLORIDE 0.9 % IV SOLN
10.0000 mg | Freq: Once | INTRAVENOUS | Status: AC
  Administered 2020-01-29: 10 mg via INTRAVENOUS
  Filled 2020-01-29: qty 10

## 2020-01-29 MED ORDER — PALONOSETRON HCL INJECTION 0.25 MG/5ML
0.2500 mg | Freq: Once | INTRAVENOUS | Status: AC
  Administered 2020-01-29: 0.25 mg via INTRAVENOUS
  Filled 2020-01-29: qty 5

## 2020-01-29 MED ORDER — DIPHENHYDRAMINE HCL 50 MG/ML IJ SOLN
50.0000 mg | Freq: Once | INTRAMUSCULAR | Status: AC
  Administered 2020-01-29: 50 mg via INTRAVENOUS
  Filled 2020-01-29: qty 1

## 2020-01-29 MED ORDER — FAMOTIDINE IN NACL 20-0.9 MG/50ML-% IV SOLN
20.0000 mg | Freq: Once | INTRAVENOUS | Status: AC
  Administered 2020-01-29: 20 mg via INTRAVENOUS
  Filled 2020-01-29: qty 50

## 2020-01-29 MED ORDER — SODIUM CHLORIDE 0.9 % IV SOLN
175.0000 mg/m2 | Freq: Once | INTRAVENOUS | Status: AC
  Administered 2020-01-29: 318 mg via INTRAVENOUS
  Filled 2020-01-29: qty 53

## 2020-01-29 MED ORDER — LORAZEPAM 0.5 MG PO TABS
0.5000 mg | ORAL_TABLET | Freq: Once | ORAL | Status: AC
  Administered 2020-01-29: 0.5 mg via ORAL
  Filled 2020-01-29: qty 1

## 2020-01-29 NOTE — Assessment & Plan Note (Addendum)
#   STAGE IV-High-grade serous carcinoma of gynecologic origin [facility] endometrial] pre-treatment-CEA 125 +1500;CT Nov 9th- Partial reponse noted on CT scan.  CEA- improving. Plan #7 chemos prior to resection.   # proceed with cycle #6 [AUC- 5 secondary thrombocytopenia] of carbotaxol chemotherapy today; platelets 83 today [see below]?re-staging with PET scan after cycle #7; discussed with Dr.Secord.   #Thrombocytopenia- likely sec to chemo; Taper off steroids-20 mg 1 week; and then 10 mg 1 week stop.  Will monitor platelets closely.  # Myalgias-likely secondary to Taxol; improved on steroids/see above.STABLE.    # Nausea- Anxiety- STABLE;  continue ativan prn.  # Constipation- on stool softner; stable on miralax.   # DISPOSITION: #  chemo today; # weekly- cbc x2 # follow up in 3 weeks-; MD- cbc/Cmp; ca-125; carbo-Taxol- Dr.B

## 2020-01-29 NOTE — Patient Instructions (Signed)
#  Take prednisone 20 mg/1 pill once a day for 1 week; then half a pill once a day for 1 week; and stop

## 2020-01-29 NOTE — Progress Notes (Signed)
Taxol/carbo well tolerated. Discharged home in stable condition.

## 2020-01-29 NOTE — Progress Notes (Signed)
[facility] CONSULT NOTE  Patient Care Team: Danelle Berry, NP as PCP - General (Nurse Practitioner) Clent Jacks, RN as Oncology Nurse Navigator  CHIEF COMPLAINTS/PURPOSE OF CONSULTATION: high grade serous cancer   #  Oncology History Overview Note  #August 2021-ascitic fluid cytology-high-grade serous carcinoma of gynecologic origin; CT scan-behind the abdominal wall 4.6 x 2.1 mass ; within the mesentery 6.7 x 4.5 cm left of mid abdomen . BIOPSY of the peritoneal mass; high grade carcinoma ca-[phone] [Dr.Byrnett]; SEP 9th 2021- PET scan-shows peritoneal carcinomatosis; uterine uptake [Dr.Secord; unable to biopsy cervical stenosis] sigmoid colonic uptake concerning for malignancy.  No ovarian uptake.  CEA 125 +1300; CEA-2.    #Hepatitis C/ectopic pregnancy/PRBC transfusion 1995-untreated.[Dr.Vanga]  # 09/14/2019-neoadjuvant CARBO-TAXOL s/p #4 cycles-partial response; DEC 9th 2021-laparoscopy-surgery aborted given the need for multiple bowel resections; DEc 22nd, 2021-proceed with neoadjuvant carbotaxol; STARTING cycle #6- AUC-5 sec to persistent thrombocytopenia.  #SEP 2021- [Dr.Vanga]Cirrhosis-child A/hepatitis C-no varices; colo-NEG for malignancy   # NGS/MOLECULAR TESTS: [employer]-ATM* [likely somatic]; MSI-STABLE. GENETICS- NEG; HRD-    # PALLIATIVE CARE EVALUATION:  # PAIN MANAGEMENT:    DIAGNOSIS: Uterine cancer  STAGE:   IV      ;  GOALS: control  CURRENT/MOST RECENT THERAPY : Carbotaxol    Uterine cancer ([place]) (Resolved)  09/04/2019 Initial Diagnosis   Gynecologic cancer [facility])   Endometrial cancer ([place])  09/14/2019 -  Chemotherapy    Patient is on Treatment Plan: OVARIAN CARBOPLATIN (AUC 6) / PACLITAXEL (175) Q21D X 6 CYCLES      10/05/2019 Initial Diagnosis   Endometrial cancer ([facility])    Genetic Testing   Negative germline genetic testing. No pathogenic variants identified on the [facility] panel. The report date is 10/01/2019. HRD  testing ([employer]) still pending.   The [facility] gene panel offered by [employer] includes sequencing and deletion/duplication testing of the following 35 genes: APC, ATM, AXIN2, BARD1, BMPR1A, BRCA1, BRCA2, BRIP1, CHD1, CDK4, CDKN2A, CHEK2, EPCAM (large rearrangement only), HOXB13, GALNT12, MLH1, MSH2, MSH3, MSH6, MUTYH, NBN, NTHL1, PALB2, PMS2, PTEN, RAD51C, RAD51D, RNF43, RPS20, SMAD4, STK11, and TP53. Sequencing was performed for select regions of POLE and POLD1, and large rearrangement analysis was performed for select regions of GREM1.    01/16/2020 Cancer Staging   Staging form: Corpus Uteri - Carcinoma and Carcinosarcoma, AJCC 8th Edition - Clinical: Stage IVB (cM1) - Signed by Cammie Sickle, MD on 01/16/2020      HISTORY OF PRESENTING ILLNESS:  Jeanette Allen 65 y.o.  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  Patient's chemotherapy cycle #6 is on hold for the last 2 weeks because of thrombocytopenia.  Last week platelets were 56.  Patient was started on prednisone 60 mg a day.  Her energy levels are up.  Constipation improved.  No bleeding.  No nausea vomiting.   Review of Systems  Constitutional: Positive for malaise/fatigue. Negative for chills, diaphoresis, fever and weight loss.  HENT: Negative for nosebleeds and sore throat.   Eyes: Negative for double vision.  Respiratory: Negative for cough, hemoptysis, sputum production, shortness of breath and wheezing.   Cardiovascular: Negative for chest pain, palpitations, orthopnea and leg swelling.  Gastrointestinal: Positive for constipation. Negative for blood in stool, diarrhea, heartburn, melena and vomiting.  Genitourinary: Negative for dysuria, frequency and urgency.  Musculoskeletal: Positive for myalgias. Negative for back pain and joint pain.  Skin: Negative.  Negative for  itching and rash.   Neurological: Negative for dizziness, tingling, focal weakness, weakness and headaches.  Endo/Heme/Allergies: Does not bruise/bleed easily.  Psychiatric/Behavioral: Negative for depression. The patient is not nervous/anxious and does not have insomnia.      MEDICAL HISTORY:  Past Medical History:  Diagnosis Date  . Arthritis   . Cancer ([place])   . Hypertension     SURGICAL HISTORY: Past Surgical History:  Procedure Laterality Date  . APPENDECTOMY    . COLONOSCOPY WITH PROPOFOL N/A 10/01/2019   Procedure: COLONOSCOPY WITH PROPOFOL;  Surgeon: Lin Landsman, MD;  Location: [facility] ENDOSCOPY;  Service: Gastroenterology;  Laterality: N/A;  . ESOPHAGOGASTRODUODENOSCOPY (EGD) WITH PROPOFOL N/A 09/12/2019   Procedure: ESOPHAGOGASTRODUODENOSCOPY (EGD) WITH PROPOFOL;  Surgeon: Lin Landsman, MD;  Location: [facility] ENDOSCOPY;  Service: Gastroenterology;  Laterality: N/A;    SOCIAL HISTORY: Social History   Socioeconomic History  . Marital status: Married    Spouse name: Not on file  . Number of children: Not on file  . Years of education: Not on file  . Highest education level: Not on file  Occupational History  . Not on file  Tobacco Use  . Smoking status: Current Every Day Smoker  . Smokeless tobacco: Never Used  Vaping Use  . Vaping Use: Never used  Substance and Sexual Activity  . Alcohol use: Not Currently  . Drug use: Never  . Sexual activity: Yes  Other Topics Concern  . Not on file  Social History Narrative   Lives with husband; near [place]. Worked Network engineer at [employer]; smoke 1/2 ppd/slowed; no alcohol; 3 children [2 boys; one girl- [employer]]   Social Determinants of Health   Financial Resource Strain: Not on file  Food Insecurity: Not on file  Transportation Needs: Not on file  Physical Activity: Not on file  Stress: Not on file  Social Connections: Not on file  Intimate Partner Violence: Not on file    FAMILY HISTORY: Family History  Problem  Relation Age of Onset  . Diabetes Maternal Grandmother   . Hypertension Maternal Grandmother   . Stroke Maternal Grandmother     ALLERGIES:  has No Known Allergies.  MEDICATIONS:  Current Outpatient Medications  Medication Sig Dispense Refill  . acetaminophen (TYLENOL) 500 MG tablet Take by mouth.    . ergocalciferol (VITAMIN D2) 1.25 MG (50000 UT) capsule Take by mouth.    Marland Kitchen FLUoxetine (PROZAC) 20 MG capsule Take by mouth.    . losartan (COZAAR) 50 MG tablet Take by mouth.    . ondansetron (ZOFRAN) 8 MG tablet One pill every 8 hours as needed for nausea/vomitting. 60 tablet 1  . ondansetron (ZOFRAN-ODT) 4 MG disintegrating tablet Take 4 mg by mouth every 8 (eight) hours as needed for nausea or vomiting.    . polyethylene glycol powder (GLYCOLAX/MIRALAX) 17 GM/SCOOP powder Take by mouth.    . predniSONE (DELTASONE) 20 MG tablet Take 3 tablets (60 mg total) by mouth daily with breakfast. In AM with food. 40 tablet 0  . omeprazole (PRILOSEC) 40 MG capsule Take 1 capsule (40 mg total) by mouth 2 (two) times daily before a meal. (Patient not taking: Reported on 01/29/2020) 30 capsule 3  . Pitavastatin Calcium (LIVALO) 4 MG TABS Take 4 mg by mouth daily. (Patient not taking: No sig reported)    . prochlorperazine (COMPAZINE) 10 MG tablet Take 1 tablet (10 mg total) by mouth every 6 (six) hours as needed for nausea or vomiting. (Patient not taking: No sig  reported) 40 tablet 1   No current facility-administered medications for this visit.   Facility-Administered Medications Ordered in Other Visits  Medication Dose Route Frequency Provider Last Rate Last Admin  . CARBOplatin (PARAPLATIN) 450 mg in sodium chloride 0.9 % 250 mL chemo infusion  450 mg Intravenous Once Charlaine Dalton R, MD      . dexamethasone (DECADRON) 10 mg in sodium chloride 0.9 % 50 mL IVPB  10 mg Intravenous Once Cammie Sickle, MD 204 mL/hr at 01/29/20 0954 10 mg at 01/29/20 0954  . famotidine (PEPCID) IVPB 20 mg  premix  20 mg Intravenous Once Charlaine Dalton R, MD      . fosaprepitant (EMEND) 150 mg in sodium chloride 0.9 % 145 mL IVPB  150 mg Intravenous Once Cammie Sickle, MD 450 mL/hr at 01/29/20 0949 150 mg at 01/29/20 0949  . PACLitaxel (TAXOL) 318 mg in sodium chloride 0.9 % 500 mL chemo infusion (> 20m/m2)  175 mg/m2 (Treatment Plan Recorded) Intravenous Once BCammie Sickle MD          .  PHYSICAL EXAMINATION: ECOG PERFORMANCE STATUS: 1 - Symptomatic but completely ambulatory  Vitals:   01/29/20 0829  BP: 136/79  Pulse: 78  Resp: 16  Temp: (!) 96.7 F (35.9 C)  SpO2: 100%   Filed Weights   01/29/20 0829  Weight: 154 lb (69.9 kg)    Physical Exam Constitutional:      Comments: Ambulating independently.  Accompanied by husband.  HENT:     Head: Normocephalic and atraumatic.     Mouth/Throat:     Pharynx: No oropharyngeal exudate.  Eyes:     Pupils: Pupils are equal, round, and reactive to light.  Cardiovascular:     Rate and Rhythm: Normal rate and regular rhythm.  Pulmonary:     Effort: Pulmonary effort is normal. No respiratory distress.     Breath sounds: Normal breath sounds. No wheezing.  Abdominal:     General: Bowel sounds are normal. There is distension.     Palpations: Abdomen is soft. There is no mass.     Tenderness: There is no abdominal tenderness. There is no guarding or rebound.  Musculoskeletal:        General: No tenderness. Normal range of motion.     Cervical back: Normal range of motion and neck supple.  Skin:    General: Skin is warm.  Neurological:     Mental Status: She is alert and oriented to person, place, and time.  Psychiatric:        Mood and Affect: Affect normal.      LABORATORY DATA:  I have reviewed the data as listed Lab Results  Component Value Date   WBC 4.4 01/29/2020   HGB 12.1 01/29/2020   HCT 35.2 (L) 01/29/2020   MCV 103.2 (H) 01/29/2020   PLT 83 (L) 01/29/2020   Recent Labs    09/14/19 0813  09/28/19 1006 10/05/19 0807 10/15/19 1107 12/07/19 1318 12/26/19 0804 01/09/20 1414 01/16/20 0841 01/23/20 0800 01/29/20 0804  NA 139 138 140   < > 138 137   < > 137 137 138  K 4.0 4.0 4.3   < > 3.9 4.0   < > 4.0 4.1 3.3*  CL 104 104 104   < > 104 104   < > 104 105 104  CO2 _0 < > 26 26   < > _1 GLUCOSE 104* 106* 108*   < >  123* 94   < > 93 96 100*  BUN _0 < > 14 19   < > _1 CREATININE 0.93 0.73 0.86   < > 0.81 0.86   < > 0.85 0.84 0.88  CALCIUM 9.2 9.0 9.9   < > 9.3 9.4   < > 9.7 9.4 9.7  GFRNONAA >60 >60 >60   < > >60 >60   < > >60 >60 >60  GFRAA >60 >60 >60  --   --   --   --   --   --   --   PROT 7.5  --  7.6   < > 7.7 7.9  --  8.2*  --   --   ALBUMIN 3.5  --  4.0   < > 3.9 4.1  --  4.2  --   --   AST 30  --  37   < > 58* 28  --  24  --   --   ALT 21  --  30   < > 54* 28  --  19  --   --   ALKPHOS 71  --  72   < > 75 75  --  74  --   --   BILITOT 0.5  --  0.5   < > 0.6 0.5  --  0.5  --   --    < > = values in this interval not displayed.    RADIOGRAPHIC STUDIES: I have personally reviewed the radiological images as listed and agreed with the findings in the report. No results found.  ASSESSMENT & PLAN:   Endometrial cancer ([place]) # STAGE IV-High-grade serous carcinoma of gynecologic origin [likley endometrial] pre-treatment-CEA 125 +1500;CT Nov 9th- Partial reponse noted on CT scan.  CEA- improving. Plan #7 chemos prior to resection.   # proceed with cycle #6 [AUC- 5 secondary thrombocytopenia] of carbotaxol chemotherapy today; platelets 83 today [see below]?re-staging with PET scan after cycle #7; discussed with Dr.Secord.   #Thrombocytopenia- likely sec to chemo; Taper off steroids-20 mg 1 week; and then 10 mg 1 week stop.  Will monitor platelets closely.  # Myalgias-likely secondary to Taxol; improved on steroids/see above.STABLE.    # Nausea- Anxiety- STABLE;  continue ativan prn.  # Constipation- on stool softner; stable on miralax.    # DISPOSITION: #  chemo today; # weekly- cbc x2 # follow up in 3 weeks-; MD- cbc/Cmp; ca-125; carbo-Taxol- Dr.B    All questions were answered. The patient knows to call the clinic with any problems, questions or concerns.   Cammie Sickle, MD 01/29/2020 9:59 AM

## 2020-02-05 ENCOUNTER — Inpatient Hospital Stay: Payer: Medicare Other | Attending: Internal Medicine

## 2020-02-05 DIAGNOSIS — Z7952 Long term (current) use of systemic steroids: Secondary | ICD-10-CM | POA: Insufficient documentation

## 2020-02-05 DIAGNOSIS — R5383 Other fatigue: Secondary | ICD-10-CM | POA: Diagnosis not present

## 2020-02-05 DIAGNOSIS — D696 Thrombocytopenia, unspecified: Secondary | ICD-10-CM | POA: Diagnosis not present

## 2020-02-05 DIAGNOSIS — F17210 Nicotine dependence, cigarettes, uncomplicated: Secondary | ICD-10-CM | POA: Insufficient documentation

## 2020-02-05 DIAGNOSIS — Z79899 Other long term (current) drug therapy: Secondary | ICD-10-CM | POA: Insufficient documentation

## 2020-02-05 DIAGNOSIS — R5381 Other malaise: Secondary | ICD-10-CM | POA: Diagnosis not present

## 2020-02-05 DIAGNOSIS — M791 Myalgia, unspecified site: Secondary | ICD-10-CM | POA: Insufficient documentation

## 2020-02-05 DIAGNOSIS — I10 Essential (primary) hypertension: Secondary | ICD-10-CM | POA: Diagnosis not present

## 2020-02-05 DIAGNOSIS — F419 Anxiety disorder, unspecified: Secondary | ICD-10-CM | POA: Diagnosis not present

## 2020-02-05 DIAGNOSIS — C541 Malignant neoplasm of endometrium: Secondary | ICD-10-CM | POA: Insufficient documentation

## 2020-02-05 DIAGNOSIS — R110 Nausea: Secondary | ICD-10-CM | POA: Diagnosis not present

## 2020-02-05 DIAGNOSIS — Z5111 Encounter for antineoplastic chemotherapy: Secondary | ICD-10-CM | POA: Diagnosis present

## 2020-02-05 DIAGNOSIS — M1990 Unspecified osteoarthritis, unspecified site: Secondary | ICD-10-CM | POA: Diagnosis not present

## 2020-02-05 DIAGNOSIS — K5900 Constipation, unspecified: Secondary | ICD-10-CM | POA: Diagnosis not present

## 2020-02-05 LAB — CBC WITH DIFFERENTIAL/PLATELET
Abs Immature Granulocytes: 0.01 10*3/uL (ref 0.00–0.07)
Basophils Absolute: 0 10*3/uL (ref 0.0–0.1)
Basophils Relative: 0 %
Eosinophils Absolute: 0 10*3/uL (ref 0.0–0.5)
Eosinophils Relative: 0 %
HCT: 33.8 % — ABNORMAL LOW (ref 36.0–46.0)
Hemoglobin: 11.8 g/dL — ABNORMAL LOW (ref 12.0–15.0)
Immature Granulocytes: 0 %
Lymphocytes Relative: 28 %
Lymphs Abs: 0.7 10*3/uL (ref 0.7–4.0)
MCH: 35.6 pg — ABNORMAL HIGH (ref 26.0–34.0)
MCHC: 34.9 g/dL (ref 30.0–36.0)
MCV: 102.1 fL — ABNORMAL HIGH (ref 80.0–100.0)
Monocytes Absolute: 0.1 10*3/uL (ref 0.1–1.0)
Monocytes Relative: 3 %
Neutro Abs: 1.8 10*3/uL (ref 1.7–7.7)
Neutrophils Relative %: 69 %
Platelets: 60 10*3/uL — ABNORMAL LOW (ref 150–400)
RBC: 3.31 MIL/uL — ABNORMAL LOW (ref 3.87–5.11)
RDW: 13.5 % (ref 11.5–15.5)
WBC: 2.6 10*3/uL — ABNORMAL LOW (ref 4.0–10.5)
nRBC: 0 % (ref 0.0–0.2)

## 2020-02-05 LAB — COMPREHENSIVE METABOLIC PANEL
ALT: 125 U/L — ABNORMAL HIGH (ref 0–44)
AST: 64 U/L — ABNORMAL HIGH (ref 15–41)
Albumin: 4 g/dL (ref 3.5–5.0)
Alkaline Phosphatase: 62 U/L (ref 38–126)
Anion gap: 7 (ref 5–15)
BUN: 21 mg/dL (ref 8–23)
CO2: 25 mmol/L (ref 22–32)
Calcium: 9.7 mg/dL (ref 8.9–10.3)
Chloride: 106 mmol/L (ref 98–111)
Creatinine, Ser: 0.88 mg/dL (ref 0.44–1.00)
GFR, Estimated: >60 mL/min (ref >60)
Glucose, Bld: 98 mg/dL (ref 70–99)
Potassium: 4.5 mmol/L (ref 3.5–5.1)
Sodium: 138 mmol/L (ref 135–145)
Total Bilirubin: 0.6 mg/dL (ref 0.3–1.2)
Total Protein: 7.3 g/dL (ref 6.5–8.1)

## 2020-02-06 LAB — CA 125: Cancer Antigen (CA) 125: 43.8 U/mL — ABNORMAL HIGH (ref 0.0–38.1)

## 2020-02-08 ENCOUNTER — Other Ambulatory Visit: Payer: Self-pay | Admitting: Internal Medicine

## 2020-02-08 ENCOUNTER — Encounter: Payer: Self-pay | Admitting: *Deleted

## 2020-02-08 ENCOUNTER — Other Ambulatory Visit: Payer: Self-pay | Admitting: *Deleted

## 2020-02-08 DIAGNOSIS — C541 Malignant neoplasm of endometrium: Secondary | ICD-10-CM

## 2020-02-08 DIAGNOSIS — D696 Thrombocytopenia, unspecified: Secondary | ICD-10-CM

## 2020-02-11 ENCOUNTER — Telehealth: Payer: Self-pay | Admitting: Internal Medicine

## 2020-02-11 NOTE — Telephone Encounter (Signed)
Spoke with patient. Clarified her apt schedule

## 2020-02-11 NOTE — Telephone Encounter (Signed)
Patient left vm requesting a call about her appointments. Routing to team for follow up as I see there is a telephone note from 2/4 about appointments.

## 2020-02-12 ENCOUNTER — Ambulatory Visit: Payer: Medicare Other | Admitting: Gastroenterology

## 2020-02-12 ENCOUNTER — Other Ambulatory Visit: Payer: Medicare Other

## 2020-02-13 ENCOUNTER — Other Ambulatory Visit: Payer: Medicare Other

## 2020-02-13 ENCOUNTER — Ambulatory Visit: Payer: Medicare Other | Admitting: Internal Medicine

## 2020-02-13 ENCOUNTER — Inpatient Hospital Stay: Payer: Medicare Other

## 2020-02-13 ENCOUNTER — Ambulatory Visit: Payer: Medicare Other

## 2020-02-13 ENCOUNTER — Other Ambulatory Visit: Payer: Self-pay

## 2020-02-13 DIAGNOSIS — C541 Malignant neoplasm of endometrium: Secondary | ICD-10-CM

## 2020-02-13 DIAGNOSIS — Z5111 Encounter for antineoplastic chemotherapy: Secondary | ICD-10-CM | POA: Diagnosis not present

## 2020-02-13 LAB — CBC WITH DIFFERENTIAL/PLATELET
Abs Immature Granulocytes: 0.02 10*3/uL (ref 0.00–0.07)
Basophils Absolute: 0 10*3/uL (ref 0.0–0.1)
Basophils Relative: 1 %
Eosinophils Absolute: 0 10*3/uL (ref 0.0–0.5)
Eosinophils Relative: 1 %
HCT: 33 % — ABNORMAL LOW (ref 36.0–46.0)
Hemoglobin: 11.4 g/dL — ABNORMAL LOW (ref 12.0–15.0)
Immature Granulocytes: 1 %
Lymphocytes Relative: 45 %
Lymphs Abs: 1 10*3/uL (ref 0.7–4.0)
MCH: 36.1 pg — ABNORMAL HIGH (ref 26.0–34.0)
MCHC: 34.5 g/dL (ref 30.0–36.0)
MCV: 104.4 fL — ABNORMAL HIGH (ref 80.0–100.0)
Monocytes Absolute: 0.4 10*3/uL (ref 0.1–1.0)
Monocytes Relative: 20 %
Neutro Abs: 0.7 10*3/uL — ABNORMAL LOW (ref 1.7–7.7)
Neutrophils Relative %: 32 %
Platelets: 94 10*3/uL — ABNORMAL LOW (ref 150–400)
RBC: 3.16 MIL/uL — ABNORMAL LOW (ref 3.87–5.11)
RDW: 14 % (ref 11.5–15.5)
WBC: 2.1 10*3/uL — ABNORMAL LOW (ref 4.0–10.5)
nRBC: 0 % (ref 0.0–0.2)

## 2020-02-19 ENCOUNTER — Inpatient Hospital Stay (HOSPITAL_BASED_OUTPATIENT_CLINIC_OR_DEPARTMENT_OTHER): Payer: Medicare Other | Admitting: Internal Medicine

## 2020-02-19 ENCOUNTER — Inpatient Hospital Stay: Payer: Medicare Other

## 2020-02-19 ENCOUNTER — Encounter: Payer: Self-pay | Admitting: Internal Medicine

## 2020-02-19 DIAGNOSIS — C541 Malignant neoplasm of endometrium: Secondary | ICD-10-CM

## 2020-02-19 DIAGNOSIS — D696 Thrombocytopenia, unspecified: Secondary | ICD-10-CM

## 2020-02-19 DIAGNOSIS — Z7189 Other specified counseling: Secondary | ICD-10-CM

## 2020-02-19 DIAGNOSIS — Z5111 Encounter for antineoplastic chemotherapy: Secondary | ICD-10-CM | POA: Diagnosis not present

## 2020-02-19 LAB — CBC WITH DIFFERENTIAL/PLATELET
Abs Immature Granulocytes: 0.01 10*3/uL (ref 0.00–0.07)
Basophils Absolute: 0 10*3/uL (ref 0.0–0.1)
Basophils Relative: 1 %
Eosinophils Absolute: 0 10*3/uL (ref 0.0–0.5)
Eosinophils Relative: 1 %
HCT: 34.4 % — ABNORMAL LOW (ref 36.0–46.0)
Hemoglobin: 12.1 g/dL (ref 12.0–15.0)
Immature Granulocytes: 0 %
Lymphocytes Relative: 29 %
Lymphs Abs: 0.9 10*3/uL (ref 0.7–4.0)
MCH: 36.3 pg — ABNORMAL HIGH (ref 26.0–34.0)
MCHC: 35.2 g/dL (ref 30.0–36.0)
MCV: 103.3 fL — ABNORMAL HIGH (ref 80.0–100.0)
Monocytes Absolute: 0.4 10*3/uL (ref 0.1–1.0)
Monocytes Relative: 13 %
Neutro Abs: 1.8 10*3/uL (ref 1.7–7.7)
Neutrophils Relative %: 56 %
Platelets: 97 10*3/uL — ABNORMAL LOW (ref 150–400)
RBC: 3.33 MIL/uL — ABNORMAL LOW (ref 3.87–5.11)
RDW: 14 % (ref 11.5–15.5)
WBC: 3.2 10*3/uL — ABNORMAL LOW (ref 4.0–10.5)
nRBC: 0 % (ref 0.0–0.2)

## 2020-02-19 LAB — COMPREHENSIVE METABOLIC PANEL
ALT: 129 U/L — ABNORMAL HIGH (ref 0–44)
AST: 86 U/L — ABNORMAL HIGH (ref 15–41)
Albumin: 3.9 g/dL (ref 3.5–5.0)
Alkaline Phosphatase: 79 U/L (ref 38–126)
Anion gap: 9 (ref 5–15)
BUN: 16 mg/dL (ref 8–23)
CO2: 25 mmol/L (ref 22–32)
Calcium: 9.6 mg/dL (ref 8.9–10.3)
Chloride: 106 mmol/L (ref 98–111)
Creatinine, Ser: 0.89 mg/dL (ref 0.44–1.00)
GFR, Estimated: >60 mL/min (ref >60)
Glucose, Bld: 92 mg/dL (ref 70–99)
Potassium: 4.2 mmol/L (ref 3.5–5.1)
Sodium: 140 mmol/L (ref 135–145)
Total Bilirubin: 0.6 mg/dL (ref 0.3–1.2)
Total Protein: 7.7 g/dL (ref 6.5–8.1)

## 2020-02-19 MED ORDER — SODIUM CHLORIDE 0.9 % IV SOLN
150.0000 mg | Freq: Once | INTRAVENOUS | Status: AC
  Administered 2020-02-19: 150 mg via INTRAVENOUS
  Filled 2020-02-19: qty 150

## 2020-02-19 MED ORDER — SODIUM CHLORIDE 0.9 % IV SOLN
452.0000 mg | Freq: Once | INTRAVENOUS | Status: AC
  Administered 2020-02-19: 450 mg via INTRAVENOUS
  Filled 2020-02-19: qty 45

## 2020-02-19 MED ORDER — SODIUM CHLORIDE 0.9 % IV SOLN
10.0000 mg | Freq: Once | INTRAVENOUS | Status: AC
  Administered 2020-02-19: 10 mg via INTRAVENOUS
  Filled 2020-02-19: qty 10

## 2020-02-19 MED ORDER — DIPHENHYDRAMINE HCL 50 MG/ML IJ SOLN
50.0000 mg | Freq: Once | INTRAMUSCULAR | Status: AC
  Administered 2020-02-19: 50 mg via INTRAVENOUS
  Filled 2020-02-19: qty 1

## 2020-02-19 MED ORDER — SODIUM CHLORIDE 0.9 % IV SOLN
Freq: Once | INTRAVENOUS | Status: AC
  Filled 2020-02-19: qty 250

## 2020-02-19 MED ORDER — SODIUM CHLORIDE 0.9 % IV SOLN
175.0000 mg/m2 | Freq: Once | INTRAVENOUS | Status: AC
  Administered 2020-02-19: 318 mg via INTRAVENOUS
  Filled 2020-02-19: qty 53

## 2020-02-19 MED ORDER — PALONOSETRON HCL INJECTION 0.25 MG/5ML
0.2500 mg | Freq: Once | INTRAVENOUS | Status: AC
  Administered 2020-02-19: 0.25 mg via INTRAVENOUS
  Filled 2020-02-19: qty 5

## 2020-02-19 MED ORDER — FAMOTIDINE IN NACL 20-0.9 MG/50ML-% IV SOLN
20.0000 mg | Freq: Once | INTRAVENOUS | Status: AC
  Administered 2020-02-19: 20 mg via INTRAVENOUS
  Filled 2020-02-19: qty 50

## 2020-02-19 MED ORDER — LORAZEPAM 0.5 MG PO TABS
0.5000 mg | ORAL_TABLET | Freq: Once | ORAL | Status: AC
  Administered 2020-02-19: 0.5 mg via ORAL
  Filled 2020-02-19: qty 1

## 2020-02-19 NOTE — Progress Notes (Signed)
plt 97 ok to treat per md

## 2020-02-19 NOTE — Assessment & Plan Note (Addendum)
#   STAGE IV-High-grade serous carcinoma of gynecologic origin [place] endometrial] pre-treatment-CEA 125 +1500;CT Nov 9th- Partial reponse noted on CT scan.  Ca-125 improving. Plan #7 chemos prior to resection.   # proceed with cycle #7  [AUC- 5 secondary thrombocytopenia] of carbotaxol chemotherapy today; platelets 90s  today [see below] plan PET scan after this cycle; appt with Gyn-onc on 3/3; PET on 2/24.   #Thrombocytopenia- likely sec to chemo- today platelets- 97. Stable.   # LFTs elevation- AST/ALT-G-1-2; sec to Taxol; ok to proceed with chemo; normal Bili- monitor closely.   # Myalgias-likely secondary to Taxol; improved on steroids/see above.STABLE.    # Nausea- Anxiety- STABLE;  continue ativan prn.  # Constipation- on stool softner; stable on miralax.   # DISPOSITION: #  Chemo today; # labs on 03/03- with gyn-onc appt cbc/cmp # follow up TBD- Dr.B

## 2020-02-19 NOTE — Progress Notes (Signed)
1000: Per Dr. Rogue Bussing okay to proceed with Platelet count of 97.   1540: Pt tolerated treatment well. No s/s of distress noted. Pt stable at discharge.

## 2020-02-19 NOTE — Progress Notes (Signed)
[facility] CONSULT NOTE  Patient Care Team: Danelle Berry, NP as PCP - General (Nurse Practitioner) Clent Jacks, RN as Oncology Nurse Navigator  CHIEF COMPLAINTS/PURPOSE OF CONSULTATION: high grade serous cancer   #  Oncology History Overview Note  #August 2021-ascitic fluid cytology-high-grade serous carcinoma of gynecologic origin; CT scan-behind the abdominal wall 4.6 x 2.1 mass ; within the mesentery 6.7 x 4.5 cm left of mid abdomen . BIOPSY of the peritoneal mass; high grade carcinoma ca-[id number] [Dr.Byrnett]; SEP 9th 2021- PET scan-shows peritoneal carcinomatosis; uterine uptake [Dr.Secord; unable to biopsy cervical stenosis] sigmoid colonic uptake concerning for malignancy.  No ovarian uptake.  CEA 125 +1300; CEA-2.    #Hepatitis C/ectopic pregnancy/PRBC transfusion 1995-untreated.[Dr.Vanga]  # 09/14/2019-neoadjuvant CARBO-TAXOL s/p #4 cycles-partial response; DEC 9th 2021-laparoscopy-surgery aborted given the need for multiple bowel resections; DEc 22nd, 2021-proceed with neoadjuvant carbotaxol; STARTING cycle #6- AUC-5 sec to persistent thrombocytopenia.  #SEP 2021- [Dr.Vanga]Cirrhosis-child A/hepatitis C-no varices; colo-NEG for malignancy   # NGS/MOLECULAR TESTS: [employer]-ATM* [likely somatic]; MSI-STABLE. GENETICS- NEG; HRD-    # PALLIATIVE CARE EVALUATION:  # PAIN MANAGEMENT:    DIAGNOSIS: Uterine cancer  STAGE:   IV      ;  GOALS: control  CURRENT/MOST RECENT THERAPY : Carbotaxol    Uterine cancer ([place]) (Resolved)  09/04/2019 Initial Diagnosis   Gynecologic cancer [facility])   Endometrial cancer ([place])  09/14/2019 -  Chemotherapy    Patient is on Treatment Plan: OVARIAN CARBOPLATIN (AUC 6) / PACLITAXEL (175) Q21D X 6 CYCLES      10/05/2019 Initial Diagnosis   Endometrial cancer ([facility])    Genetic Testing   Negative germline genetic testing. No pathogenic variants identified on the [facility] panel. The report date is 10/01/2019. HRD  testing ([employer]) still pending.   The [facility] gene panel offered by [employer] includes sequencing and deletion/duplication testing of the following 35 genes: APC, ATM, AXIN2, BARD1, BMPR1A, BRCA1, BRCA2, BRIP1, CHD1, CDK4, CDKN2A, CHEK2, EPCAM (large rearrangement only), HOXB13, GALNT12, MLH1, MSH2, MSH3, MSH6, MUTYH, NBN, NTHL1, PALB2, PMS2, PTEN, RAD51C, RAD51D, RNF43, RPS20, SMAD4, STK11, and TP53. Sequencing was performed for select regions of POLE and POLD1, and large rearrangement analysis was performed for select regions of GREM1.    01/16/2020 Cancer Staging   Staging form: Corpus Uteri - Carcinoma and Carcinosarcoma, AJCC 8th Edition - Clinical: Stage IVB (cM1) - Signed by Cammie Sickle, MD on 01/16/2020      HISTORY OF PRESENTING ILLNESS:  Jeanette Allen 65 y.o.  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  Patient had interruption of her cycle #5 chemotherapy because of thrombocytopenia.  Patient received cycle #6 approximately 3 weeks ago.  Patient denies any bleeding gums or nosebleeds.  Appetite is good.  No weight loss.  Constipation stable.  Review of Systems  Constitutional: Positive for malaise/fatigue. Negative for chills, diaphoresis, fever and weight loss.  HENT: Negative for nosebleeds and sore throat.   Eyes: Negative for double vision.  Respiratory: Negative for cough, hemoptysis, sputum production, shortness of breath and wheezing.   Cardiovascular: Negative for chest pain, palpitations, orthopnea and leg swelling.  Gastrointestinal: Positive for constipation. Negative for blood in stool, diarrhea, heartburn, melena and vomiting.  Genitourinary: Negative for dysuria, frequency and urgency.  Musculoskeletal: Positive for myalgias. Negative for back pain and joint pain.  Skin: Negative.  Negative for itching and rash.  Neurological:  Negative for dizziness,  tingling, focal weakness, weakness and headaches.  Endo/Heme/Allergies: Does not bruise/bleed easily.  Psychiatric/Behavioral: Negative for depression. The patient is not nervous/anxious and does not have insomnia.      MEDICAL HISTORY:  Past Medical History:  Diagnosis Date  . Arthritis   . Cancer ([place])   . Hypertension     SURGICAL HISTORY: Past Surgical History:  Procedure Laterality Date  . APPENDECTOMY    . COLONOSCOPY WITH PROPOFOL N/A 10/01/2019   Procedure: COLONOSCOPY WITH PROPOFOL;  Surgeon: Lin Landsman, MD;  Location: [facility] ENDOSCOPY;  Service: Gastroenterology;  Laterality: N/A;  . ESOPHAGOGASTRODUODENOSCOPY (EGD) WITH PROPOFOL N/A 09/12/2019   Procedure: ESOPHAGOGASTRODUODENOSCOPY (EGD) WITH PROPOFOL;  Surgeon: Lin Landsman, MD;  Location: [facility] ENDOSCOPY;  Service: Gastroenterology;  Laterality: N/A;    SOCIAL HISTORY: Social History   Socioeconomic History  . Marital status: Married    Spouse name: Not on file  . Number of children: Not on file  . Years of education: Not on file  . Highest education level: Not on file  Occupational History  . Not on file  Tobacco Use  . Smoking status: Current Every Day Smoker  . Smokeless tobacco: Never Used  Vaping Use  . Vaping Use: Never used  Substance and Sexual Activity  . Alcohol use: Not Currently  . Drug use: Never  . Sexual activity: Yes  Other Topics Concern  . Not on file  Social History Narrative   Lives with husband; near [place]. Worked Network engineer at [employer]; smoke 1/2 ppd/slowed; no alcohol; 3 children [2 boys; one girl- [employer]]   Social Determinants of Health   Financial Resource Strain: Not on file  Food Insecurity: Not on file  Transportation Needs: Not on file  Physical Activity: Not on file  Stress: Not on file  Social Connections: Not on file  Intimate Partner Violence: Not on file    FAMILY HISTORY: Family History  Problem Relation Age of  Onset  . Diabetes Maternal Grandmother   . Hypertension Maternal Grandmother   . Stroke Maternal Grandmother     ALLERGIES:  has No Known Allergies.  MEDICATIONS:  Current Outpatient Medications  Medication Sig Dispense Refill  . acetaminophen (TYLENOL) 500 MG tablet Take by mouth.    . ergocalciferol (VITAMIN D2) 1.25 MG (50000 UT) capsule Take by mouth.    Marland Kitchen FLUoxetine (PROZAC) 20 MG capsule Take by mouth.    . losartan (COZAAR) 50 MG tablet Take by mouth.    . ondansetron (ZOFRAN) 8 MG tablet One pill every 8 hours as needed for nausea/vomitting. 60 tablet 1  . ondansetron (ZOFRAN-ODT) 4 MG disintegrating tablet Take 4 mg by mouth every 8 (eight) hours as needed for nausea or vomiting.    . polyethylene glycol powder (GLYCOLAX/MIRALAX) 17 GM/SCOOP powder Take by mouth.    Marland Kitchen omeprazole (PRILOSEC) 40 MG capsule Take 1 capsule (40 mg total) by mouth 2 (two) times daily before a meal. (Patient not taking: No sig reported) 30 capsule 3  . predniSONE (DELTASONE) 20 MG tablet Take 3 tablets (60 mg total) by mouth daily with breakfast. In AM with food. (Patient not taking: Reported on 02/19/2020) 40 tablet 0  . prochlorperazine (COMPAZINE) 10 MG tablet Take 1 tablet (10 mg total) by mouth every 6 (six) hours as needed for nausea or vomiting. (Patient not taking: No sig reported) 40 tablet 1   No current facility-administered medications for this visit.   Facility-Administered Medications Ordered in Other Visits  Medication Dose Route  Frequency Provider Last Rate Last Admin  . CARBOplatin (PARAPLATIN) 450 mg in sodium chloride 0.9 % 250 mL chemo infusion  450 mg Intravenous Once Charlaine Dalton R, MD      . PACLitaxel (TAXOL) 318 mg in sodium chloride 0.9 % 500 mL chemo infusion (> 27m/m2)  175 mg/m2 (Treatment Plan Recorded) Intravenous Once BCammie Sickle MD 184 mL/hr at 02/19/20 1134 318 mg at 02/19/20 1134      .  PHYSICAL EXAMINATION: ECOG PERFORMANCE STATUS: 1 -  Symptomatic but completely ambulatory  Vitals:   02/19/20 0844  BP: (!) 148/75  Pulse: 78  Resp: 16  Temp: (!) 97.3 F (36.3 C)  SpO2: 100%   Filed Weights   02/19/20 0844  Weight: 154 lb (69.9 kg)    Physical Exam Constitutional:      Comments: Ambulating independently.  Accompanied by husband.  HENT:     Head: Normocephalic and atraumatic.     Mouth/Throat:     Pharynx: No oropharyngeal exudate.  Eyes:     Pupils: Pupils are equal, round, and reactive to light.  Cardiovascular:     Rate and Rhythm: Normal rate and regular rhythm.  Pulmonary:     Effort: Pulmonary effort is normal. No respiratory distress.     Breath sounds: Normal breath sounds. No wheezing.  Abdominal:     General: Bowel sounds are normal. There is distension.     Palpations: Abdomen is soft. There is no mass.     Tenderness: There is no abdominal tenderness. There is no guarding or rebound.  Musculoskeletal:        General: No tenderness. Normal range of motion.     Cervical back: Normal range of motion and neck supple.  Skin:    General: Skin is warm.  Neurological:     Mental Status: She is alert and oriented to person, place, and time.  Psychiatric:        Mood and Affect: Affect normal.      LABORATORY DATA:  I have reviewed the data as listed Lab Results  Component Value Date   WBC 3.2 (L) 02/19/2020   HGB 12.1 02/19/2020   HCT 34.4 (L) 02/19/2020   MCV 103.3 (H) 02/19/2020   PLT 97 (L) 02/19/2020   Recent Labs    09/14/19 0813 09/28/19 1006 10/05/19 0807 10/15/19 1107 01/16/20 0841 01/23/20 0800 01/29/20 0804 02/05/20 1059 02/19/20 0834  NA 139 138 140   < > 137   < > 138 138 140  K 4.0 4.0 4.3   < > 4.0   < > 3.3* 4.5 4.2  CL 104 104 104   < > 104   < > 104 106 106  CO2 _0 < > 28   < > _1 GLUCOSE 104* 106* 108*   < > 93   < > 100* 98 92  BUN _2 < > 14   < > _3 CREATININE 0.93 0.73 0.86   < > 0.85   < > 0.88 0.88 0.89  CALCIUM 9.2 9.0  9.9   < > 9.7   < > 9.7 9.7 9.6  GFRNONAA >60 >60 >60   < > >60   < > >60 >60 >60  GFRAA >60 >60 >60  --   --   --   --   --   --   PROT 7.5  --  7.6   < >  8.2*  --   --  7.3 7.7  ALBUMIN 3.5  --  4.0   < > 4.2  --   --  4.0 3.9  AST 30  --  37   < > 24  --   --  64* 86*  ALT 21  --  30   < > 19  --   --  125* 129*  ALKPHOS 71  --  72   < > 74  --   --  62 79  BILITOT 0.5  --  0.5   < > 0.5  --   --  0.6 0.6   < > = values in this interval not displayed.    RADIOGRAPHIC STUDIES: I have personally reviewed the radiological images as listed and agreed with the findings in the report. No results found.  ASSESSMENT & PLAN:   Endometrial cancer ([place]) # STAGE IV-High-grade serous carcinoma of gynecologic origin [likley endometrial] pre-treatment-CEA 125 +1500;CT Nov 9th- Partial reponse noted on CT scan.  Ca-125 improving. Plan #7 chemos prior to resection.   # proceed with cycle #7  [AUC- 5 secondary thrombocytopenia] of carbotaxol chemotherapy today; platelets 90s  today [see below] plan PET scan after this cycle; appt with Gyn-onc on 3/3; PET on 2/24.   #Thrombocytopenia- likely sec to chemo- today platelets- 97. Stable.   # LFTs elevation- AST/ALT-G-1-2; sec to Taxol; ok to proceed with chemo; normal Bili- monitor closely.   # Myalgias-likely secondary to Taxol; improved on steroids/see above.STABLE.    # Nausea- Anxiety- STABLE;  continue ativan prn.  # Constipation- on stool softner; stable on miralax.   # DISPOSITION: #  Chemo today; # labs on 03/03- with gyn-onc appt cbc/cmp # follow up TBD- Dr.B    All questions were answered. The patient knows to call the clinic with any problems, questions or concerns.   Cammie Sickle, MD 02/19/2020 1:12 PM

## 2020-02-20 LAB — CA 125: Cancer Antigen (CA) 125: 54.6 U/mL — ABNORMAL HIGH (ref 0.0–38.1)

## 2020-02-28 ENCOUNTER — Ambulatory Visit
Admission: RE | Admit: 2020-02-28 | Discharge: 2020-02-28 | Disposition: A | Discharge status: Home / Self Care | Payer: Medicare Other | Source: Ambulatory Visit | Attending: Internal Medicine | Admitting: Internal Medicine

## 2020-02-28 ENCOUNTER — Other Ambulatory Visit: Payer: Self-pay

## 2020-02-28 DIAGNOSIS — C541 Malignant neoplasm of endometrium: Secondary | ICD-10-CM | POA: Insufficient documentation

## 2020-02-28 DIAGNOSIS — Z08 Encounter for follow-up examination after completed treatment for malignant neoplasm: Secondary | ICD-10-CM | POA: Diagnosis present

## 2020-02-28 LAB — GLUCOSE, CAPILLARY: Glucose-Capillary: 84 mg/dL (ref 70–99)

## 2020-02-28 MED ORDER — FLUDEOXYGLUCOSE F - 18 (FDG) INJECTION
8.0000 | Freq: Once | INTRAVENOUS | Status: AC | PRN
  Administered 2020-02-28: 8.57 via INTRAVENOUS

## 2020-03-03 ENCOUNTER — Telehealth: Payer: Self-pay | Admitting: Internal Medicine

## 2020-03-03 NOTE — Telephone Encounter (Signed)
On 2/25-I called patient to review the results of the PET scan biopsy; left a voicemail-overall good response noted.  However, await repeat evaluation with gynecology oncology as planned this week.   Follow-up with [place] to be decided post gynecology oncology evaluation/surgery.

## 2020-03-05 ENCOUNTER — Inpatient Hospital Stay: Payer: Medicare Other | Attending: Obstetrics and Gynecology | Admitting: Obstetrics and Gynecology

## 2020-03-05 ENCOUNTER — Encounter: Payer: Self-pay | Admitting: Obstetrics and Gynecology

## 2020-03-05 ENCOUNTER — Other Ambulatory Visit: Payer: Self-pay

## 2020-03-05 ENCOUNTER — Telehealth: Payer: Self-pay | Admitting: *Deleted

## 2020-03-05 ENCOUNTER — Inpatient Hospital Stay: Payer: Medicare Other

## 2020-03-05 VITALS — BP 116/63 | HR 93 | Temp 98.7°F | Resp 20 | Wt 153.0 lb

## 2020-03-05 DIAGNOSIS — C541 Malignant neoplasm of endometrium: Secondary | ICD-10-CM

## 2020-03-05 DIAGNOSIS — Z9221 Personal history of antineoplastic chemotherapy: Secondary | ICD-10-CM | POA: Insufficient documentation

## 2020-03-05 DIAGNOSIS — C786 Secondary malignant neoplasm of retroperitoneum and peritoneum: Secondary | ICD-10-CM | POA: Insufficient documentation

## 2020-03-05 DIAGNOSIS — F17210 Nicotine dependence, cigarettes, uncomplicated: Secondary | ICD-10-CM | POA: Insufficient documentation

## 2020-03-05 DIAGNOSIS — C801 Malignant (primary) neoplasm, unspecified: Secondary | ICD-10-CM | POA: Diagnosis not present

## 2020-03-05 DIAGNOSIS — Z79899 Other long term (current) drug therapy: Secondary | ICD-10-CM | POA: Diagnosis not present

## 2020-03-05 DIAGNOSIS — C787 Secondary malignant neoplasm of liver and intrahepatic bile duct: Secondary | ICD-10-CM | POA: Diagnosis not present

## 2020-03-05 DIAGNOSIS — I10 Essential (primary) hypertension: Secondary | ICD-10-CM | POA: Diagnosis not present

## 2020-03-05 DIAGNOSIS — B1920 Unspecified viral hepatitis C without hepatic coma: Secondary | ICD-10-CM | POA: Diagnosis not present

## 2020-03-05 LAB — CBC WITH DIFFERENTIAL/PLATELET
Abs Immature Granulocytes: 0 10*3/uL (ref 0.00–0.07)
Basophils Absolute: 0 10*3/uL (ref 0.0–0.1)
Basophils Relative: 1 %
Eosinophils Absolute: 0 10*3/uL (ref 0.0–0.5)
Eosinophils Relative: 1 %
HCT: 30.3 % — ABNORMAL LOW (ref 36.0–46.0)
Hemoglobin: 10.6 g/dL — ABNORMAL LOW (ref 12.0–15.0)
Immature Granulocytes: 0 %
Lymphocytes Relative: 57 %
Lymphs Abs: 0.9 10*3/uL (ref 0.7–4.0)
MCH: 36.7 pg — ABNORMAL HIGH (ref 26.0–34.0)
MCHC: 35 g/dL (ref 30.0–36.0)
MCV: 104.8 fL — ABNORMAL HIGH (ref 80.0–100.0)
Monocytes Absolute: 0.3 10*3/uL (ref 0.1–1.0)
Monocytes Relative: 21 %
Neutro Abs: 0.3 10*3/uL — CL (ref 1.7–7.7)
Neutrophils Relative %: 20 %
Platelets: 91 10*3/uL — ABNORMAL LOW (ref 150–400)
RBC: 2.89 MIL/uL — ABNORMAL LOW (ref 3.87–5.11)
RDW: 14.1 % (ref 11.5–15.5)
Smear Review: NORMAL
WBC: 1.5 10*3/uL — ABNORMAL LOW (ref 4.0–10.5)
nRBC: 0 % (ref 0.0–0.2)

## 2020-03-05 LAB — COMPREHENSIVE METABOLIC PANEL
ALT: 86 U/L — ABNORMAL HIGH (ref 0–44)
AST: 67 U/L — ABNORMAL HIGH (ref 15–41)
Albumin: 3.8 g/dL (ref 3.5–5.0)
Alkaline Phosphatase: 71 U/L (ref 38–126)
Anion gap: 8 (ref 5–15)
BUN: 13 mg/dL (ref 8–23)
CO2: 25 mmol/L (ref 22–32)
Calcium: 9.4 mg/dL (ref 8.9–10.3)
Chloride: 106 mmol/L (ref 98–111)
Creatinine, Ser: 0.95 mg/dL (ref 0.44–1.00)
GFR, Estimated: >60 mL/min (ref >60)
Glucose, Bld: 117 mg/dL — ABNORMAL HIGH (ref 70–99)
Potassium: 3.7 mmol/L (ref 3.5–5.1)
Sodium: 139 mmol/L (ref 135–145)
Total Bilirubin: 0.6 mg/dL (ref 0.3–1.2)
Total Protein: 6.9 g/dL (ref 6.5–8.1)

## 2020-03-05 NOTE — Telephone Encounter (Signed)
1333-Per [facility] in cancer center lab anc 0.3/ wbc 1.5  Critical value. Read back process performed with techn. Dr. Rogue Bussing informed- read back process performed with md 1333

## 2020-03-05 NOTE — Progress Notes (Signed)
Gynecologic Oncology Interval Visit   Referring Provider: Dr. 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 G4P3 female (h/o ruptured ectopic s/p XL) who is seen in consultation from Dr. 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 She has a medical history significant for Hepatitis C (ectopic pregnancy in 1990 requiring transfusion).   On 12/13/2019 she underwent EUA, diagnostic laparoscopy and D&C.   Upon diagnostic laparoscopy the Fagotti score was calculated based on:  Tumor diffusion along the omentum up to the large stomach curvature score 2  Massive peritoneal involvement and/or miliary pattern of distribution of peritoneal carcinomatosis score 2  Wide spread infiltrating carcinomatosis and/or confluent nodules to the majority of the diaphragmatic surface absent, score 0  Large infiltrating nodules and/or an involvement of the root of themesenterysupposed on the basis of limited movements of the various intestinal segments 2  Possible large/small bowel resection (excluding recto-sigmoid resection) and/or extended carcinomatosis on the ansae score 2  Obvious neoplastic involvement of the gastric wall score 2  Liver surface lesions larger than 2 cm score 0  Total score: 10  A.  Endometrium, curettage:  Scant atypical cells present suspicious but not diagnostic of malignancy, A1-4    WT-1   Negative A1-5    P16     Patchy positive A1-6    P53     Positive/abnormal in atypical cells  These findings are suspicious but not diagnostic of malignancy.  Evaluation is limited by scant cellularity.    We recommended additional chemotherapy.  12/26/2019 Cycle #5 CA125 99.3 01/29/2020 Cycle #6 Dose reduction carboplatin AUC- 5 secondary thrombocytopenia at cycle  #6.  CA125 72.3 02/05/20 CA125 43.8 02/19/2020 she underwent cycle #7 of chemotherapy. AST/ALT-G-1-2; secondary to Taxol. CA125 on 02/19/20 54.6   02/29/2020 PET scan CHEST: Previous described LEFT upper lobe nodule is no longer identified. Hypermetabolic mediastinal lymph nodes.   ABDOMEN/PELVIS: Dramatic reduction in multifocal hypermetabolic peritoneal carcinomatosis with near complete resolution of the previously intensely radiotracer avid and extensive peritoneal carcinomatosis.  Two foci of hypermetabolic of peritoneal tissue remain. LEFT upper quadrant tubular thickening measuring 3.2 by 1.2 cm with SUV max equal 4.5. This is reduced significantly in size from bulky peritoneal mass with SUV max equal 9.6 on prior.  Second focus of radiotracer activity along the inferior margin of the LEFT hepatic lobe just beneath the falciform ligament with SUV max equal 5.1. This activity is more difficult to place and presumed along the surface of the liver but cannot fully exclude physiologic activity in adjacent bowel.  Resolution of the previously seen bulky hypermetabolic tissue associated with the sigmoid colon. Near complete resolution of the metabolic activity associated with the uterus. Mild activity remain the uterus which is nonspecific (SUV max equal 3.8 on image 75).  IMPRESSION: 1. Dramatic positive response to therapy with near complete resolution of the hypermetabolic peritoneal metastasis. 2. Tubular focus of hypermetabolic peritoneal thickening in the LEFT upper quadrant does remain and has moderate metabolic activity decreased from bulky intense radiotracer activity on prior. 3. A second focus of metabolic activity along the inferior margin LEFT hepatic lobe may also represent mild residual disease. 4. Resolution of metabolic activity within the uterus and sigmoid colon. Mild residual activity may be physiologic. 5. Resolution of intraperitoneal free fluid. 6. Resolution of LEFT upper  lobe pulmonary nodule.  Today she is feeling well. Her appetite and strength have continued to improve. She is happy about her PET results.    Gynecologic Oncology History Jeanette Allen is a pleasant female who is seen in consultation from Dr. Charlynn Grimes and Bary Castilla for elevated CA125 and ascites. She has a medical history significant for Hepatitis C (ectopic pregnancy in 1990 requiring transfusion).  Her history is as follows:  She presented with abdominal pain for 2-3 weeks, daily nausea (episode of emesis of CT contrast), early satiety and abdominal bloating.    08/20/2019 US Abdomen  Cirrhotic liver with small amount of perihepatic ascites. Multiple peripancreatic and perihepatic hypoechoic nodules, the largest of which measures 2.0 cm at the inferior liver edge. Recommend CT for further evaluation.  08/22/2019 The patient underwent a contrast-enhanced CT. This described possibility of gas around the gallbladder. Cholecystitis is a consideration.  Extensive abdominal and pelvic ascites. No retroperitoneal masses. No evidence of an acute inflammatory process. Suspected uterine fibroid. No free air.   08/23/2019 She saw Dr. Bary Castilla for evaluation given gallbladder findings.   Labs: elevated hepatitis C virus antibody. Liver function with elevated transaminases. Normal total bilirubin, albumin and electrolytes. Normal renal function. CBC showed a hemoglobin of 15.6 with an MCV of 95, white blood cell count of 6500 with normal differential. Hemoglobin A1c 5.5.  CA125= 1228; CEA= 2.1; alpha-fetoprotein = 2,2, and CA19-9 <2.  08/27/2019 Paracentesis total of approximately 1250 mL of clear, amber colored fluid was Removed. LDH 334; ANC42; total nucleated cell 3,035; No WBC; No organisms; No growth; Albumin 2.8; total protein 5.0; Glucose 98. Cytology- POSITIVE FOR MALIGNANCY. - COMPATIBLE WITH HIGH-GRADE SEROUS CARCINOMA.   Attempted endometrial biopsy but procedure aborted due to cervical  stenosis.   09/07/2019 biopsy of peritoneal mass DIAGNOSIS:  A. PERITONEAL MASS, LEFT LATERAL; CT-GUIDED CORE BIOPSY:  - METASTATIC HIGH-GRADE SEROUS CARCINOMA.   09/12/2019 DUODENAL BULB; COLD BIOPSY DIAGNOSIS:  A. DUODENAL BULB; COLD BIOPSY:  - DUODENAL MUCOSA DISPLAYING NORMAL VILLOUS ARCHITECTURE AND MILD  BRUNNER GLAND HYPERPLASIA.  - NEGATIVE FOR FEATURES OF CELIAC, DYSPLASIA, AND MALIGNANCY.   PET 09/13/2019 IMPRESSION: 1. Diffuse and extensive peritoneal carcinomatosis as detailed above. The uterus and the sigmoid colon are abnormal and the primary neoplasm could be the sigmoid colon or possibly endometrial carcinoma. Given their close proximity it is also possible that this is sigmoid colon cancer invading the uterus or less likely vice versa. Recommend omental biopsy or sigmoidoscopy. 2. Single left upper lobe pulmonary nodule is mildly hypermetabolic and could reflect metastatic disease.  Given extensive nature of disease recommendation was for NACT.   09/14/19- Cycle 1  arboplatin/Taxol CA125 1,501 10/05/19- Cycle 2  CA125 1,196  10/11/19- US Venous Bilateral for leg pain post chemotherapy was negative for DVT  10/26/19- Cycle 3 CA125 614  11/06/2019- CT for restaging- Decreased ascites and decreased size of bulky omental disease and areas of peritoneal disease. Omental disease remains bulky as described. Mild to moderate improvement in upper abdominal adenopathy. Particularly with respect to gastrohepatic implants or lymph nodes that were seen previously. Resolution of LEFT- sided pleural fluid. Subjective decrease in mesenteric thickening. New signs of disease though smaller areas would be difficult to detect given the extensive nature of preexistent disease. No evidence of metastatic disease to the chest. Aortic atherosclerosis. Mild distension of distal small bowel in the setting of peritoneal disease with mild angulation of some small bowel loops.   11/16/19- Cycle 4 CA125  260  Genetic testing - [facility] My[facility]  was negative.   Negative germline genetic testing. No pathogenic variants identified on the [facility] panel. The report date is 10/01/2019. HRD testing ([employer]) still pending.   The [facility] gene panel offered by [employer] includes sequencing and deletion/duplication testing of the following 35 genes: APC, ATM, AXIN2, BARD1, BMPR1A, BRCA1, BRCA2, BRIP1, CHD1, CDK4, CDKN2A, CHEK2, EPCAM (large rearrangement only), HOXB13, GALNT12, MLH1, MSH2, MSH3, MSH6, MUTYH, NBN, NTHL1, PALB2, PMS2, PTEN, RAD51C, RAD51D, RNF43, RPS20, SMAD4, STK11, and TP53. Sequencing was performed for select regions of POLE and POLD1, and large rearrangement analysis was performed for select regions of GREM1.     Tumor testing -  ATM and T53 alterations; ESRI-CCDC170 fusion [employer]    COVID vaccination 05/24/2019 and 06/21/2019  Problem List: Patient Active Problem List   Diagnosis Date Noted  . Genetic testing 10/11/2019  . Endometrial cancer ([place]) 10/05/2019  . Abnormal PET scan of colon   . 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 Goals of care, counseling/discussion 09/04/2019  . Hepatitis C infection 08/29/2019  . CAD (coronary artery disease) 08/29/2019  . Depression 08/29/2019  . Esophageal reflux 08/29/2019  . Hyperlipidemia 08/29/2019  . Hypertension 08/29/2019  . Nonrheumatic mitral valve disorder 08/29/2019  . Vitamin B12 deficiency 08/29/2019  . Vitamin D deficiency 08/29/2019  . Tobacco abuse 08/29/2019    Past Medical History: Past Medical History:  Diagnosis Date  . Arthritis   . Cancer ([place])   . Hypertension     Past Surgical History: Past Surgical History:  Procedure Laterality Date  . APPENDECTOMY    . COLONOSCOPY WITH PROPOFOL N/A 10/01/2019   Procedure: COLONOSCOPY WITH PROPOFOL;  Surgeon: Lin Landsman, MD;  Location[facility] ENDOSCOPY;  Service: Gastroenterology;  Laterality: N/A;  .  ESOPHAGOGASTRODUODENOSCOPY (EGD) WITH PROPOFOL N/A 09/12/2019   Procedure: ESOPHAGOGASTRODUODENOSCOPY (EGD) WITH PROPOFOL;  Surgeon: Lin Landsman, MD;  Location[facility] ENDOSCOPY;  Service: Gastroenterology;  Laterality: N/A;  colonoscopy 2011 Tubal pregnancy 1990 Tubal reversal  Past Gynecologic History:  Menarche: age 31 Last menstrual period: age 55   OB History:  OB History  Gravida Para Term Preterm AB Living  _0 SAB IAB Ectopic Multiple Live Births      1        # Outcome Date GA Lbr Len/2nd Weight Sex Delivery Anes PTL Lv  4 Ectopic           3 Para           2 Para           1 Para             Family History: Family History  Problem Relation Age of Onset  . Diabetes Maternal Grandmother   . Hypertension Maternal Grandmother   . Stroke Maternal Grandmother    Immunization History  Administered Date(s) Administered  . Fluad Quad(high Dose 65+) 10/26/2019  . Moderna Sars-Covid-2 Vaccination 05/24/2019, 06/21/2019   Social History: Social History   Socioeconomic History  . Marital status: Married    Spouse name: Not on file  . Number of children: Not on file  . Years of education: Not on file  . Highest education level: Not on file  Occupational History  . Not on file  Tobacco Use  . Smoking status: Current Every Day Smoker  . Smokeless tobacco: Never Used  Vaping Use  . Vaping Use: Never used  Substance and Sexual Activity  . Alcohol  use: Not Currently  . Drug use: Never  . Sexual activity: Yes  Other Topics Concern  . Not on file  Social History Narrative   Lives with husband; near [place]. Worked Network engineer at [employer]; smoke 1/2 ppd/slowed; no alcohol; 3 children [2 boys; one girl- [employer]]   Social Determinants of Health   Financial Resource Strain: Not on file  Food Insecurity: Not on file  Transportation Needs: Not on file  Physical Activity: Not on file  Stress: Not on file  Social Connections: Not on file   Intimate Partner Violence: Not on file   Allergies: No Known Allergies  Current Medications: Current Outpatient Medications  Medication Sig Dispense Refill  . acetaminophen (TYLENOL) 500 MG tablet Take by mouth.    . ergocalciferol (VITAMIN D2) 1.25 MG (50000 UT) capsule Take by mouth.    Marland Kitchen FLUoxetine (PROZAC) 20 MG capsule Take by mouth.    . losartan (COZAAR) 50 MG tablet Take by mouth.    Marland Kitchen omeprazole (PRILOSEC) 40 MG capsule Take 1 capsule (40 mg total) by mouth 2 (two) times daily before a meal. (Patient not taking: No sig reported) 30 capsule 3  . ondansetron (ZOFRAN) 8 MG tablet One pill every 8 hours as needed for nausea/vomitting. 60 tablet 1  . ondansetron (ZOFRAN-ODT) 4 MG disintegrating tablet Take 4 mg by mouth every 8 (eight) hours as needed for nausea or vomiting.    . polyethylene glycol powder (GLYCOLAX/MIRALAX) 17 GM/SCOOP powder Take by mouth.    . predniSONE (DELTASONE) 20 MG tablet Take 3 tablets (60 mg total) by mouth daily with breakfast. In AM with food. (Patient not taking: Reported on 02/19/2020) 40 tablet 0  . prochlorperazine (COMPAZINE) 10 MG tablet Take 1 tablet (10 mg total) by mouth every 6 (six) hours as needed for nausea or vomiting. (Patient not taking: No sig reported) 40 tablet 1   No current facility-administered medications for this visit.   Review of Systems General: fatigue & weakness  HEENT: no complaints  Lungs: shortness of breath  Cardiac: no complaints  GI: no complaints  GU: no complaints  Musculoskeletal: no complaints  Extremities: no complaints  Skin: no complaints  Neuro: no complaints  Endocrine: no complaints  Psych: no complaints     Objective:  Physical Examination:  BP 116/63   Pulse 93   Temp 98.7 F (37.1 C)   Resp 20   Wt 153 lb (69.4 kg)   SpO2 100%   BMI 26.26 kg/m    ECOG Performance Status: 1 - Symptomatic but completely ambulatory  GENERAL: Patient is a well appearing female in no acute distress HEENT:   PERRL, neck supple with midline trachea. Thyroid without masses.  NODES:  No cervical, supraclavicular, axillary, or inguinal lymphadenopathy palpated.  LUNGS:  Clear to auscultation bilaterally.  No wheezes or rhonchi. HEART:  Regular rate and rhythm. No murmur appreciated. ABDOMEN:  Soft, nontender.  Positive, normoactive bowel sounds.  EXTREMITIES:  No peripheral edema.   SKIN:  Clear with no obvious rashes or skin changes. No nail dyscrasia. NEURO:  Nonfocal. Well oriented.  Appropriate affect.  Pelvic: EGBUS: no lesions Cervix: no lesions, nontender, mobile Vagina: no lesions, no discharge or bleeding Uterus: normal size, nontender, mobile Adnexa: no palpable masses Previously palpated firm plaque like midline mass in the cul-de-sac measuring ~3 cm and firm and 1 cm right parametrial nodule is not appreciated.   Lab Review CBC Latest Ref Rng & Units 02/19/2020 02/13/2020 02/05/2020  WBC  4.0 - 10.5 K/uL 3.2(L) 2.1(L) 2.6(L)  Hemoglobin 12.0 - 15.0 g/dL 12.1 11.4(L) 11.8(L)  Hematocrit 36.0 - 46.0 % 34.4(L) 33.0(L) 33.8(L)  Platelets 150 - 400 K/uL 97(L) 94(L) 60(L)  ANC 800 (11/07/19)    Chemistry      Component Value Date/Time   NA 140 02/19/2020 0834   K 4.2 02/19/2020 0834   CL 106 02/19/2020 0834   CO2 25 02/19/2020 0834   BUN 16 02/19/2020 0834   CREATININE 0.89 02/19/2020 0834      Component Value Date/Time   CALCIUM 9.6 02/19/2020 0834   ALKPHOS 79 02/19/2020 0834   AST 86 (H) 02/19/2020 0834   ALT 129 (H) 02/19/2020 0834   BILITOT 0.6 02/19/2020 0834       Radiologic Imaging:  I personally reviewed PET imaging from 02/28/20.     Assessment:  KENYATTA GLOECKNER is a 65 y.o. female diagnosed with advanced high grade serous cancer (most likely endometrial (ATM and T53 alterations; ESRI-CCDC170 fusion) based on peritoneal biopsy. PET imaging concerning for endometrial primary. EMBx could not be performed due to cervical stenosis but curettage performed in the OR on  12/13/2019 and concerning but not diagnostic of malignancy. S/p 7 cycles paclitaxel/carboplatin with evidence of response based in CA125 and PET.   Hepatitis C, elevated AST mild, normal bilirubin and albumin.   Medical co-morbidities complicating care: HTN and prior abdominal surgery and Hepatitis C.  Plan:   Problem List Items Addressed This Visit      Genitourinary   Endometrial cancer ([place]) - Primary     We discussed the role of surgery. She has had an impressive response to therapy. Surgery will not clear all her disease. She has mediastinal nodes that are concerning for malignancy but these are not significantly enlarged. The pulmonary nodule has resolved. Surgery may enhance disease control and I discussed this with the patient and her daughter. The disadvantage of surgery is that there are the potential risks of surgery and we may not be able to clear all her disease.     Send tumor from interval debulking for MMR/MSI testing given concern for endometrial biopsy.   Hepatitis C. At the time of her last surgery her GI team clears her for surgery. Continue to follow LFTs.   Plan for surgery at [facility]. We will arrange with the OR schedule. Surgery may be performed via a minimally invasive approach with hand-assist and possible laparotomy. Plan for TH/BSO/Omentectomy/possible bowel surgery/possible ostomy.  Plan for bowel prep given the extent of prior disease with concern for involvement of the rectosigmoid.   Jeanette Gwinn Gaetana Michaelis, MD

## 2020-03-13 ENCOUNTER — Other Ambulatory Visit: Payer: Self-pay | Admitting: Nurse Practitioner

## 2020-03-13 ENCOUNTER — Telehealth: Payer: Self-pay | Admitting: *Deleted

## 2020-03-13 MED ORDER — AMOXICILLIN-POT CLAVULANATE 875-125 MG PO TABS: 1.0000 | ORAL_TABLET | Freq: Two times a day (BID) | ORAL | 0 refills | Status: AC

## 2020-03-13 NOTE — Telephone Encounter (Signed)
Patient called stating that she is not going to be able to have her surgery tomorrow due to a URI and she is asking if Dr B will order antibiotics for her or what she needs to do. Please advise

## 2020-03-13 NOTE — Telephone Encounter (Signed)
Spoke to patient's husband and sent in prescription for augmentin based on her symptoms. Surgery has been rescheduled for 2 weeks from today. She will call back if symptoms don't improve or worsen in the interim.

## 2020-03-13 NOTE — Telephone Encounter (Signed)
Lauren - patient has already reached out to Dr. Gershon Crane office- see [facility]. This was Dr. Gershon Crane recommendations    Telephone Encounter - Charlton Haws, RN - 03/13/2020 9:11 AM EST Formatting of this note might be different from the original. Patient calling back to ask if antibiotics can be called in for her URI. Encouraged her to call her PCP to discuss whether antibiotics would be appropriate for her current symptoms. She verbalized understanding.    Electronically signed by Charlton Haws, RN at 03/13/2020 9:12 AM EST   Back to top of Miscellaneous Notes  Telephone Encounter - Tomie China, RN - 03/13/2020 8:59 AM EST Formatting of this note might be different from the original. Call returned to Ms. Sebesta. Per Dr. Theora Gianotti will delay surgery until 03/27/2020 due to upper respiratory symptoms. Per the team it is ok to take Mucinex to help with congestion, encouraged hydration. Informed patient that she will need to have repeat Covid testing 2-3 days prior to 3/24. Patient will contact our team if her symptoms do not improve. Patient verbalized understanding and had no further questions at this time.

## 2020-03-18 ENCOUNTER — Ambulatory Visit: Payer: Medicare Other | Admitting: Gastroenterology

## 2020-04-08 ENCOUNTER — Encounter: Payer: Self-pay | Admitting: Obstetrics and Gynecology

## 2020-04-09 ENCOUNTER — Telehealth: Payer: Self-pay | Admitting: Internal Medicine

## 2020-04-09 ENCOUNTER — Other Ambulatory Visit: Payer: Self-pay

## 2020-04-09 ENCOUNTER — Inpatient Hospital Stay: Payer: Medicare Other | Attending: Obstetrics and Gynecology | Admitting: Obstetrics and Gynecology

## 2020-04-09 VITALS — BP 128/65 | HR 78 | Temp 98.4°F | Resp 20 | Wt 146.2 lb

## 2020-04-09 DIAGNOSIS — I10 Essential (primary) hypertension: Secondary | ICD-10-CM | POA: Insufficient documentation

## 2020-04-09 DIAGNOSIS — C763 Malignant neoplasm of pelvis: Secondary | ICD-10-CM

## 2020-04-09 DIAGNOSIS — C579 Malignant neoplasm of female genital organ, unspecified: Secondary | ICD-10-CM | POA: Insufficient documentation

## 2020-04-09 DIAGNOSIS — E559 Vitamin D deficiency, unspecified: Secondary | ICD-10-CM | POA: Insufficient documentation

## 2020-04-09 DIAGNOSIS — R911 Solitary pulmonary nodule: Secondary | ICD-10-CM | POA: Insufficient documentation

## 2020-04-09 DIAGNOSIS — F17210 Nicotine dependence, cigarettes, uncomplicated: Secondary | ICD-10-CM | POA: Insufficient documentation

## 2020-04-09 DIAGNOSIS — B1920 Unspecified viral hepatitis C without hepatic coma: Secondary | ICD-10-CM | POA: Insufficient documentation

## 2020-04-09 DIAGNOSIS — I2510 Atherosclerotic heart disease of native coronary artery without angina pectoris: Secondary | ICD-10-CM | POA: Insufficient documentation

## 2020-04-09 DIAGNOSIS — Z5111 Encounter for antineoplastic chemotherapy: Secondary | ICD-10-CM | POA: Insufficient documentation

## 2020-04-09 DIAGNOSIS — E785 Hyperlipidemia, unspecified: Secondary | ICD-10-CM | POA: Insufficient documentation

## 2020-04-09 DIAGNOSIS — Z90722 Acquired absence of ovaries, bilateral: Secondary | ICD-10-CM | POA: Insufficient documentation

## 2020-04-09 DIAGNOSIS — Z90710 Acquired absence of both cervix and uterus: Secondary | ICD-10-CM | POA: Insufficient documentation

## 2020-04-09 DIAGNOSIS — E538 Deficiency of other specified B group vitamins: Secondary | ICD-10-CM | POA: Insufficient documentation

## 2020-04-09 DIAGNOSIS — R971 Elevated cancer antigen 125 [CA 125]: Secondary | ICD-10-CM | POA: Insufficient documentation

## 2020-04-09 NOTE — Telephone Encounter (Signed)
Update - HRD testing cancelled due to not enough sample.

## 2020-04-09 NOTE — Addendum Note (Signed)
Addended by: Gloris Ham on: 04/09/2020 01:42 PM   Modules accepted: Orders

## 2020-04-09 NOTE — Progress Notes (Addendum)
Gynecologic Oncology Interval Visit   Referring Provider: Dr. 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 G4P3 female (h/o ruptured ectopic s/p XL) who is seen in consultation from Dr. Rogue Bussing and Bary Castilla for elevated high grade serous cancer s/p 7 cycles carboplatin-Taxol chemotherapy followed by TLH-BSO on 03/27/20 who returns to clinic for re-evaluation. She has a medical history significant for Hepatitis C (ectopic pregnancy in 1990 requiring transfusion).   03/27/20 - TAH- BSO, omentectomy, sigmoid bowel resection with loop ileostomy and colonic mucous fistula by Dr. Theora Gianotti She is doing well post op and is here for post op check.  Findings Diagnostic laparoscopy: Upper abdomen with diffuse miliary disease along the right hemidiaphragm. Liver surface and stomach normal appearing. Omental disease visible involving the infragastric omentum. In the pelvis there was a large pelvic mass involving the uterus, left adnexa, and sigmoid colon.   Fagotti Score: Fagotti scoring performed as preoperative diagnosis was possible ovarian/tubal/peritoneal cancer. Massive peritoneal involvement and/or a miliary pattern of distribution for peritoneal carcinomatosis (score 0); wide spread infiltrating carcinomatosis,and/or confluent nodules to the most part of the diaphragmatic surface (score 2); large infiltrating nodules and/or an involvement of the root of the mesentery supposed on the basis of limited movements of the various intestinal segments (score 0); tumor diffusion along the omentum up to the large stomach curvature (score 0);possible large/small bowel resection (excluding recto-sigmoid\resection) and/or extended carcinomatosis on the ansae (score 2);obvious neoplastic involvement of the gastric wall (score 0); and liver surface lesions larger than 2 cm (score 0). Total score = 4.    Exploratory laparotomy: Confirmed above findings. Large omental cake involving the omentum, colon mesentery up to the splenic flexure and the cecum with partial obstruction of the bowel. Tumor also present in the right paracolic gutter and involving the cecum and bowel mesentery and right diaphragm. Small nodules involving the small bowel mesentary ranging from 5-15 mm. Tumor in the pelvis appeared to be arising from the uterus and invading the adnexa and sigmoid colon with partial rectosigmoid obstruction. Frozen section of the uterus revealed a poorly differentiated malignancy of uterine origin.   Diverting loop ileostomy done given concern for possible large bowel obstruction at the levels of the cecum and the transverse colon; and a mucous fistula.    A.  Uterus, cervix, bilateral fallopian tubes, and ovaries: Cervix: Negative for tumor. Endomyometrium and serosa: Endometrial adenocarcinoma (3.0 cm), serous type. Tumor invades the full thickness of the myometrium to involve the uterine serosa (1.9 of 1.9 cm). No definitive lymphovascular invasion is identified. Leiomyoma (0.6 cm). MMR/MSI testing has been ordered on block A3. Right ovary: Metastatic adenocarcinoma, involving the ovarian surface and outer parenchyma. Right fallopian tube: Not identified. Left ovary: Metastatic adenocarcinoma, involving the ovarian surface and outer parenchyma. Left fallopian tube: Not identified.  B.  Large intestine, sigmoid colon: Metastatic adenocarcinoma (up to 1.3 cm), involving mesentery and invading through muscularis propria to focally involve the submucosa of the colon. Metastatic adenocarcinoma involving six of six pericolonic lymph nodes (6/6).  Somatic Omn[employer] genetic testing showed TP53 and ATM mutation, negative for HER-2 and MSS   Gynecologic Oncology History Jeanette Allen is a pleasant female who is seen in consultation from Dr. Charlynn Grimes and Bary Castilla for elevated CA125 and  ascites. She has a medical history significant for Hepatitis C (ectopic pregnancy in 1990 requiring transfusion).  Her history is as follows:  She presented  with abdominal pain for 2-3 weeks, daily nausea (episode of emesis of CT contrast), early satiety and abdominal bloating.    08/20/2019 US Abdomen  Cirrhotic liver with small amount of perihepatic ascites. Multiple peripancreatic and perihepatic hypoechoic nodules, the largest of which measures 2.0 cm at the inferior liver edge. Recommend CT for further evaluation.  08/22/2019 The patient underwent a contrast-enhanced CT. This described possibility of gas around the gallbladder. Cholecystitis is a consideration.  Extensive abdominal and pelvic ascites. No retroperitoneal masses. No evidence of an acute inflammatory process. Suspected uterine fibroid. No free air.   08/23/2019 She saw Dr. Bary Castilla for evaluation given gallbladder findings.   Labs: elevated hepatitis C virus antibody. Liver function with elevated transaminases. Normal total bilirubin, albumin and electrolytes. Normal renal function. CBC showed a hemoglobin of 15.6 with an MCV of 95, white blood cell count of 6500 with normal differential. Hemoglobin A1c 5.5.  CA125= 1228; CEA= 2.1; alpha-fetoprotein = 2,2, and CA19-9 <2.  08/27/2019 Paracentesis total of approximately 1250 mL of clear, amber colored fluid was Removed. LDH 334; ANC42; total nucleated cell 3,035; No WBC; No organisms; No growth; Albumin 2.8; total protein 5.0; Glucose 98. Cytology- POSITIVE FOR MALIGNANCY. - COMPATIBLE WITH HIGH-GRADE SEROUS CARCINOMA.   Attempted endometrial biopsy but procedure aborted due to cervical stenosis.   09/07/2019 biopsy of peritoneal mass DIAGNOSIS:  A. PERITONEAL MASS, LEFT LATERAL; CT-GUIDED CORE BIOPSY:  - METASTATIC HIGH-GRADE SEROUS CARCINOMA.   09/12/2019 DUODENAL BULB; COLD BIOPSY DIAGNOSIS:  A. DUODENAL BULB; COLD BIOPSY:  - DUODENAL MUCOSA DISPLAYING NORMAL VILLOUS  ARCHITECTURE AND MILD  BRUNNER GLAND HYPERPLASIA.  - NEGATIVE FOR FEATURES OF CELIAC, DYSPLASIA, AND MALIGNANCY.   PET 09/13/2019 IMPRESSION: 1. Diffuse and extensive peritoneal carcinomatosis as detailed above. The uterus and the sigmoid colon are abnormal and the primary neoplasm could be the sigmoid colon or possibly endometrial carcinoma. Given their close proximity it is also possible that this is sigmoid colon cancer invading the uterus or less likely vice versa. Recommend omental biopsy or sigmoidoscopy. 2. Single left upper lobe pulmonary nodule is mildly hypermetabolic and could reflect metastatic disease.  Given extensive nature of disease recommendation was for NACT.   09/14/19- Cycle 1  arboplatin/Taxol CA125 1,501 10/05/19- Cycle 2  CA125 1,196  10/11/19- US Venous Bilateral for leg pain post chemotherapy was negative for DVT  10/26/19- Cycle 3 CA125 614  11/06/2019- CT for restaging- Decreased ascites and decreased size of bulky omental disease and areas of peritoneal disease. Omental disease remains bulky as described. Mild to moderate improvement in upper abdominal adenopathy. Particularly with respect to gastrohepatic implants or lymph nodes that were seen previously. Resolution of LEFT- sided pleural fluid. Subjective decrease in mesenteric thickening. New signs of disease though smaller areas would be difficult to detect given the extensive nature of preexistent disease. No evidence of metastatic disease to the chest. Aortic atherosclerosis. Mild distension of distal small bowel in the setting of peritoneal disease with mild angulation of some small bowel loops.   11/16/19- Cycle 4 CA125 260  On 12/13/2019 she underwent EUA, diagnostic laparoscopy and D&C.   Upon diagnostic laparoscopy the Fagotti score was calculated based on:  Tumor diffusion along the omentum up to the large stomach curvature score 2  Massive peritoneal involvement and/or miliary pattern of distribution of  peritoneal carcinomatosis score 2  Wide spread infiltrating carcinomatosis and/or confluent nodules to the majority of the diaphragmatic surface absent, score 0  Large infiltrating nodules and/or an involvement of the root  of themesenterysupposed on the basis of limited movements of the various intestinal segments 2  Possible large/small bowel resection (excluding recto-sigmoid resection) and/or extended carcinomatosis on the ansae score 2  Obvious neoplastic involvement of the gastric wall score 2  Liver surface lesions larger than 2 cm score 0  Total score: 10  A.  Endometrium, curettage:  Scant atypical cells present suspicious but not diagnostic of malignancy, A1-4    WT-1   Negative A1-5    P16     Patchy positive A1-6    P53     Positive/abnormal in atypical cells  These findings are suspicious but not diagnostic of malignancy.  Evaluation is limited by scant cellularity.    We recommended additional chemotherapy.  12/26/2019 Cycle #5 CA125 99.3 01/29/2020 Cycle #6 Dose reduction carboplatin AUC- 5 secondary thrombocytopenia at cycle #6.  CA125 72.3 02/05/20 CA125 43.8 02/19/2020 she underwent cycle #7 of chemotherapy. AST/ALT-G-1-2; secondary to Taxol. CA125 on 02/19/20 54.6   02/29/2020 PET scan CHEST: Previous described LEFT upper lobe nodule is no longer identified. Hypermetabolic mediastinal lymph nodes.   ABDOMEN/PELVIS: Dramatic reduction in multifocal hypermetabolic peritoneal carcinomatosis with near complete resolution of the previously intensely radiotracer avid and extensive peritoneal carcinomatosis.  Two foci of hypermetabolic of peritoneal tissue remain. LEFT upper quadrant tubular thickening measuring 3.2 by 1.2 cm with SUV max equal 4.5. This is reduced significantly in size from bulky peritoneal mass with SUV max equal 9.6 on prior.  Second focus of radiotracer activity along the inferior margin of the LEFT hepatic lobe just beneath the falciform ligament  with SUV max equal 5.1. This activity is more difficult to place and presumed along the surface of the liver but cannot fully exclude physiologic activity in adjacent bowel.  Resolution of the previously seen bulky hypermetabolic tissue associated with the sigmoid colon. Near complete resolution of the metabolic activity associated with the uterus. Mild activity remain the uterus which is nonspecific (SUV max equal 3.8 on image 75).  IMPRESSION: 1. Dramatic positive response to therapy with near complete resolution of the hypermetabolic peritoneal metastasis. 2. Tubular focus of hypermetabolic peritoneal thickening in the LEFT upper quadrant does remain and has moderate metabolic activity decreased from bulky intense radiotracer activity on prior. 3. A second focus of metabolic activity along the inferior margin LEFT hepatic lobe may also represent mild residual disease. 4. Resolution of metabolic activity within the uterus and sigmoid colon. Mild residual activity may be physiologic. 5. Resolution of intraperitoneal free fluid. 6. Resolution of LEFT upper lobe pulmonary nodule.  Genetic testing - [facility] MyRisk was negative.   Negative germline genetic testing. No pathogenic variants identified on the [facility] panel. The report date is 10/01/2019. HRD testing ([employer]) still pending.   The [facility] gene panel offered by [employer] includes sequencing and deletion/duplication testing of the following 35 genes: APC, ATM, AXIN2, BARD1, BMPR1A, BRCA1, BRCA2, BRIP1, CHD1, CDK4, CDKN2A, CHEK2, EPCAM (large rearrangement only), HOXB13, GALNT12, MLH1, MSH2, MSH3, MSH6, MUTYH, NBN, NTHL1, PALB2, PMS2, PTEN, RAD51C, RAD51D, RNF43, RPS20, SMAD4, STK11, and TP53. Sequencing was performed for select regions of POLE and POLD1, and large rearrangement analysis was performed for select regions of GREM1.     Tumor testing -  ATM and T53 alterations; ESRI-CCDC170 fusion [employer]. Insufficient  tissue from biopsy for HRD testing. Opted for [facility] upfront. Post surgery, pathology consistent with endometrial cancer, hold on HRD testing.     COVID vaccination 05/24/2019 and 06/21/2019  Problem List: Patient Active Problem  List   Diagnosis Date Noted  . Genetic testing 10/11/2019  . Endometrial cancer ([place]) 10/05/2019  . Abnormal PET scan of colon   . 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 Goals of care, counseling/discussion 09/04/2019  . Hepatitis C infection 08/29/2019  . CAD (coronary artery disease) 08/29/2019  . Depression 08/29/2019  . Esophageal reflux 08/29/2019  . Hyperlipidemia 08/29/2019  . Hypertension 08/29/2019  . Nonrheumatic mitral valve disorder 08/29/2019  . Vitamin B12 deficiency 08/29/2019  . Vitamin D deficiency 08/29/2019  . Tobacco abuse 08/29/2019    Past Medical History: Past Medical History:  Diagnosis Date  . Arthritis   . Cancer ([place])   . Hypertension    Past Surgical History: Past Surgical History:  Procedure Laterality Date  . APPENDECTOMY    . COLONOSCOPY WITH PROPOFOL N/A 10/01/2019   Procedure: COLONOSCOPY WITH PROPOFOL;  Surgeon: Lin Landsman, MD;  Location[facility] ENDOSCOPY;  Service: Gastroenterology;  Laterality: N/A;  . ESOPHAGOGASTRODUODENOSCOPY (EGD) WITH PROPOFOL N/A 09/12/2019   Procedure: ESOPHAGOGASTRODUODENOSCOPY (EGD) WITH PROPOFOL;  Surgeon: Lin Landsman, MD;  Location: [facility] ENDOSCOPY;  Service: Gastroenterology;  Laterality: N/A;  colonoscopy 2011 Tubal pregnancy 1990 Tubal reversal  Past Gynecologic History:  Menarche: age 27 Last menstrual period: age 3  OB History:  OB History  Gravida Para Term Preterm AB Living  _0 SAB IAB Ectopic Multiple Live Births      1        # Outcome Date GA Lbr Len/2nd Weight Sex Delivery Anes PTL Lv  4 Ectopic           3 Para           2 Para           1 Para             Family History: Family History  Problem Relation Age of Onset   . Diabetes Maternal Grandmother   . Hypertension Maternal Grandmother   . Stroke Maternal Grandmother    Immunization History  Administered Date(s) Administered  . Fluad Quad(high Dose 65+) 10/26/2019  . Moderna Sars-Covid-2 Vaccination 05/24/2019, 06/21/2019   Social History: Social History   Socioeconomic History  . Marital status: Married    Spouse name: Not on file  . Number of children: Not on file  . Years of education: Not on file  . Highest education level: Not on file  Occupational History  . Not on file  Tobacco Use  . Smoking status: Current Every Day Smoker  . Smokeless tobacco: Never Used  Vaping Use  . Vaping Use: Never used  Substance and Sexual Activity  . Alcohol use: Not Currently  . Drug use: Never  . Sexual activity: Yes  Other Topics Concern  . Not on file  Social History Narrative   Lives with husband; near [place]. Worked Network engineer at [employer]; smoke 1/2 ppd/slowed; no alcohol; 3 children [2 boys; one girl- [employer]]   Social Determinants of Health   Financial Resource Strain: Not on file  Food Insecurity: Not on file  Transportation Needs: Not on file  Physical Activity: Not on file  Stress: Not on file  Social Connections: Not on file  Intimate Partner Violence: Not on file   Immunization History  Administered Date(s) Administered  . Fluad Quad(high Dose 65+) 10/26/2019  . Moderna Sars-Covid-2 Vaccination 05/24/2019, 06/21/2019   Allergies: No Known Allergies  Current Medications: Current Outpatient Medications  Medication Sig Dispense Refill  . acetaminophen (TYLENOL) 500 MG tablet Take by mouth.    . ergocalciferol (VITAMIN D2) 1.25 MG (50000 UT) capsule Take by mouth.    Marland Kitchen FLUoxetine (PROZAC) 20 MG capsule Take by mouth.    . losartan (COZAAR) 50 MG tablet Take by mouth.    Marland Kitchen omeprazole (PRILOSEC) 40 MG capsule Take 1 capsule (40 mg total) by mouth 2 (two) times daily before a meal. (Patient not taking: No sig  reported) 30 capsule 3  . ondansetron (ZOFRAN) 8 MG tablet One pill every 8 hours as needed for nausea/vomitting. 60 tablet 1  . ondansetron (ZOFRAN-ODT) 4 MG disintegrating tablet Take 4 mg by mouth every 8 (eight) hours as needed for nausea or vomiting.    . polyethylene glycol powder (GLYCOLAX/MIRALAX) 17 GM/SCOOP powder Take by mouth.    . predniSONE (DELTASONE) 20 MG tablet Take 3 tablets (60 mg total) by mouth daily with breakfast. In AM with food. (Patient not taking: No sig reported) 40 tablet 0  . prochlorperazine (COMPAZINE) 10 MG tablet Take 1 tablet (10 mg total) by mouth every 6 (six) hours as needed for nausea or vomiting. 40 tablet 1   No current facility-administered medications for this visit.   Review of Systems General:  no complaints Skin: no complaints Eyes: no complaints HEENT: no complaints Breasts: no complaints Pulmonary: no complaints Cardiac: no complaints Gastrointestinal: no complaints Genitourinary/Sexual: no complaints Ob/Gyn: no complaints Musculoskeletal: no complaints Hematology: no complaints Neurologic/Psych: no complaints   Objective:  Physical Examination:  BP 128/65   Pulse 78   Temp 98.4 F (36.9 C)   Resp 20   Wt 146 lb 3.2 oz (66.3 kg)   SpO2 100%   BMI 25.10 kg/m    ECOG Performance Status: 1 - Symptomatic but completely ambulatory  GENERAL: Patient is a well appearing female in no acute distress HEENT:  Sclera clear. Anicteric NODES:  Negative axillary, supraclavicular, inguinal lymph node survery LUNGS:  Clear to auscultation bilaterally.   HEART:  Regular rate and rhythm.  ABDOMEN:  Soft, nontender.  No hernias, incisions well healed. Staples removed and steri-strips applied. No masses or ascites. Mucous fistula in LUQ. EXTREMITIES:  No peripheral edema. Atraumatic. No cyanosis SKIN:  Clear with no obvious rashes or skin changes.  NEURO:  Nonfocal. Well oriented.  Appropriate affect.  Pelvic: Chaperoned by nursing EGBUS:  no lesions Vagina: cuff healing well.  no discharge or bleeding Bimanual/RV: no palpable masses  Lab Review CBC Latest Ref Rng & Units 03/05/2020 02/19/2020 02/13/2020  WBC 4.0 - 10.5 K/uL 1.5(L) 3.2(L) 2.1(L)  Hemoglobin 12.0 - 15.0 g/dL 10.6(L) 12.1 11.4(L)  Hematocrit 36.0 - 46.0 % 30.3(L) 34.4(L) 33.0(L)  Platelets 150 - 400 K/uL 91(L) 97(L) 94(L)  ANC 800 (11/07/19)    Chemistry      Component Value Date/Time   NA 139 03/05/2020 1307   K 3.7 03/05/2020 1307   CL 106 03/05/2020 1307   CO2 25 03/05/2020 1307   BUN 13 03/05/2020 1307   CREATININE 0.95 03/05/2020 1307      Component Value Date/Time   CALCIUM 9.4 03/05/2020 1307   ALKPHOS 71 03/05/2020 1307   AST 67 (H) 03/05/2020 1307   ALT 86 (H) 03/05/2020 1307   BILITOT 0.6 03/05/2020 1307     Radiologic Imaging: No imaging on site today    Assessment:  Jeanette Allen is a 65 y.o. 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 PET imaging concerning for endometrial primary. EMBx could not be performed due to cervical stenosis, but curettage performed in the OR on 12/13/2019 and concerning but not diagnostic of malignancy. S/p 7 cycles paclitaxel/carboplatin with evidence of response based in CA125 and PET. Underwent TAH, BSO, RS resection, loop ileostomy, mucous fistula 3/22.  A diverting loop ileostomy done given concern for possible large bowel obstruction at the levels of the cecum and the transverse colon; and a mucous fistula.   Cancer was involving uterus, both ovaries and sigmoid mesentery and there was residual disease.  Clearly is an endometrial high grade serous primary.  Normal post op exam. Staples removed.   HER2 negative, MSS  Hepatitis C, elevated AST mild, normal bilirubin and albumin.   Medical co-morbidities complicating care: HTN and prior abdominal surgery and Hepatitis C.  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 She has mediastinal nodes that are concerning for malignancy but these are not significantly enlarged. The pulmonary nodule has resolved.   Plan is to continue a few more cycles of carbplatin/taxol with Dr Rogue Bussing.  We will see her back in 3 months.  If she progresses then pembro/lenvima would be the next option.    Hepatitis C. Continue to follow LFTs.    Beckey Rutter, DNP, AGNP-C [place] at [facility] [phone] (clinic)  I personally interviewed and examined the patient. Agreed with the above/below plan of care. I have directly contributed to assessment and plan of care of this patient and educated and discussed with patient and family.  Mellody Drown, MD

## 2020-04-09 NOTE — Telephone Encounter (Signed)
Discussed with Dr.Berchuck; plan to restart back on adjuvant chemotherapy  C-please schedule- on 4/13- MD; labs- cbc/cmp;ca-125; carbot-taxol [q3w]-Dr.B

## 2020-04-16 ENCOUNTER — Inpatient Hospital Stay: Payer: Medicare Other

## 2020-04-16 ENCOUNTER — Other Ambulatory Visit: Payer: Self-pay

## 2020-04-16 ENCOUNTER — Inpatient Hospital Stay (HOSPITAL_BASED_OUTPATIENT_CLINIC_OR_DEPARTMENT_OTHER): Payer: Medicare Other | Admitting: Internal Medicine

## 2020-04-16 ENCOUNTER — Encounter: Payer: Self-pay | Admitting: Internal Medicine

## 2020-04-16 VITALS — BP 132/79 | HR 96 | Resp 16

## 2020-04-16 DIAGNOSIS — F17210 Nicotine dependence, cigarettes, uncomplicated: Secondary | ICD-10-CM | POA: Diagnosis not present

## 2020-04-16 DIAGNOSIS — I2510 Atherosclerotic heart disease of native coronary artery without angina pectoris: Secondary | ICD-10-CM | POA: Diagnosis not present

## 2020-04-16 DIAGNOSIS — E785 Hyperlipidemia, unspecified: Secondary | ICD-10-CM | POA: Diagnosis not present

## 2020-04-16 DIAGNOSIS — R911 Solitary pulmonary nodule: Secondary | ICD-10-CM | POA: Diagnosis not present

## 2020-04-16 DIAGNOSIS — C541 Malignant neoplasm of endometrium: Secondary | ICD-10-CM

## 2020-04-16 DIAGNOSIS — Z90710 Acquired absence of both cervix and uterus: Secondary | ICD-10-CM | POA: Diagnosis not present

## 2020-04-16 DIAGNOSIS — Z90722 Acquired absence of ovaries, bilateral: Secondary | ICD-10-CM | POA: Diagnosis not present

## 2020-04-16 DIAGNOSIS — Z5111 Encounter for antineoplastic chemotherapy: Secondary | ICD-10-CM | POA: Diagnosis not present

## 2020-04-16 DIAGNOSIS — E559 Vitamin D deficiency, unspecified: Secondary | ICD-10-CM | POA: Diagnosis not present

## 2020-04-16 DIAGNOSIS — C579 Malignant neoplasm of female genital organ, unspecified: Secondary | ICD-10-CM | POA: Diagnosis present

## 2020-04-16 DIAGNOSIS — E538 Deficiency of other specified B group vitamins: Secondary | ICD-10-CM | POA: Diagnosis not present

## 2020-04-16 DIAGNOSIS — Z7189 Other specified counseling: Secondary | ICD-10-CM

## 2020-04-16 DIAGNOSIS — I10 Essential (primary) hypertension: Secondary | ICD-10-CM | POA: Diagnosis not present

## 2020-04-16 DIAGNOSIS — R971 Elevated cancer antigen 125 [CA 125]: Secondary | ICD-10-CM | POA: Diagnosis not present

## 2020-04-16 DIAGNOSIS — C763 Malignant neoplasm of pelvis: Secondary | ICD-10-CM

## 2020-04-16 DIAGNOSIS — B1920 Unspecified viral hepatitis C without hepatic coma: Secondary | ICD-10-CM | POA: Diagnosis not present

## 2020-04-16 LAB — COMPREHENSIVE METABOLIC PANEL
ALT: 21 U/L (ref 0–44)
AST: 23 U/L (ref 15–41)
Albumin: 4.2 g/dL (ref 3.5–5.0)
Alkaline Phosphatase: 62 U/L (ref 38–126)
Anion gap: 9 (ref 5–15)
BUN: 25 mg/dL — ABNORMAL HIGH (ref 8–23)
CO2: 22 mmol/L (ref 22–32)
Calcium: 10.2 mg/dL (ref 8.9–10.3)
Chloride: 107 mmol/L (ref 98–111)
Creatinine, Ser: 0.9 mg/dL (ref 0.44–1.00)
GFR, Estimated: >60 mL/min (ref >60)
Glucose, Bld: 98 mg/dL (ref 70–99)
Potassium: 4.1 mmol/L (ref 3.5–5.1)
Sodium: 138 mmol/L (ref 135–145)
Total Bilirubin: 0.6 mg/dL (ref 0.3–1.2)
Total Protein: 8 g/dL (ref 6.5–8.1)

## 2020-04-16 LAB — CBC WITH DIFFERENTIAL/PLATELET
Abs Immature Granulocytes: 0.01 10*3/uL (ref 0.00–0.07)
Basophils Absolute: 0 10*3/uL (ref 0.0–0.1)
Basophils Relative: 1 %
Eosinophils Absolute: 0.1 10*3/uL (ref 0.0–0.5)
Eosinophils Relative: 3 %
HCT: 32.6 % — ABNORMAL LOW (ref 36.0–46.0)
Hemoglobin: 11.4 g/dL — ABNORMAL LOW (ref 12.0–15.0)
Immature Granulocytes: 0 %
Lymphocytes Relative: 28 %
Lymphs Abs: 1.3 10*3/uL (ref 0.7–4.0)
MCH: 37.3 pg — ABNORMAL HIGH (ref 26.0–34.0)
MCHC: 35 g/dL (ref 30.0–36.0)
MCV: 106.5 fL — ABNORMAL HIGH (ref 80.0–100.0)
Monocytes Absolute: 0.6 10*3/uL (ref 0.1–1.0)
Monocytes Relative: 12 %
Neutro Abs: 2.7 10*3/uL (ref 1.7–7.7)
Neutrophils Relative %: 56 %
Platelets: 206 10*3/uL (ref 150–400)
RBC: 3.06 MIL/uL — ABNORMAL LOW (ref 3.87–5.11)
RDW: 13.6 % (ref 11.5–15.5)
WBC: 4.8 10*3/uL (ref 4.0–10.5)
nRBC: 0 % (ref 0.0–0.2)

## 2020-04-16 MED ORDER — LORAZEPAM 0.5 MG PO TABS
0.5000 mg | ORAL_TABLET | Freq: Once | ORAL | Status: AC
  Administered 2020-04-16: 0.5 mg via ORAL
  Filled 2020-04-16: qty 1

## 2020-04-16 MED ORDER — FAMOTIDINE 20 MG IN NS 100 ML IVPB
20.0000 mg | Freq: Once | INTRAVENOUS | Status: AC
  Administered 2020-04-16: 20 mg via INTRAVENOUS
  Filled 2020-04-16: qty 100
  Filled 2020-04-16: qty 20

## 2020-04-16 MED ORDER — SODIUM CHLORIDE 0.9 % IV SOLN
452.0000 mg | Freq: Once | INTRAVENOUS | Status: AC
  Administered 2020-04-16: 450 mg via INTRAVENOUS
  Filled 2020-04-16: qty 45

## 2020-04-16 MED ORDER — DEXAMETHASONE SODIUM PHOSPHATE 100 MG/10ML IJ SOLN
10.0000 mg | Freq: Once | INTRAMUSCULAR | Status: AC
  Administered 2020-04-16: 10 mg via INTRAVENOUS
  Filled 2020-04-16: qty 10

## 2020-04-16 MED ORDER — HEPARIN SOD (PORK) LOCK FLUSH 100 UNIT/ML IV SOLN
INTRAVENOUS | Status: AC
  Filled 2020-04-16: qty 5

## 2020-04-16 MED ORDER — SODIUM CHLORIDE 0.9 % IV SOLN
175.0000 mg/m2 | Freq: Once | INTRAVENOUS | Status: AC
  Administered 2020-04-16: 318 mg via INTRAVENOUS
  Filled 2020-04-16: qty 53

## 2020-04-16 MED ORDER — SODIUM CHLORIDE 0.9 % IV SOLN
150.0000 mg | Freq: Once | INTRAVENOUS | Status: AC
  Administered 2020-04-16: 150 mg via INTRAVENOUS
  Filled 2020-04-16: qty 150

## 2020-04-16 MED ORDER — FLUOXETINE HCL 40 MG PO CAPS: 40.0000 mg | ORAL_CAPSULE | Freq: Every day | ORAL | 3 refills | Status: DC

## 2020-04-16 MED ORDER — DIPHENHYDRAMINE HCL 50 MG/ML IJ SOLN
50.0000 mg | Freq: Once | INTRAMUSCULAR | Status: AC
Start: 2020-04-16 — End: 2020-04-16
  Administered 2020-04-16: 50 mg via INTRAVENOUS
  Filled 2020-04-16: qty 1

## 2020-04-16 MED ORDER — SODIUM CHLORIDE 0.9 % IV SOLN
Freq: Once | INTRAVENOUS | Status: AC
  Filled 2020-04-16: qty 250

## 2020-04-16 MED ORDER — PALONOSETRON HCL INJECTION 0.25 MG/5ML
0.2500 mg | Freq: Once | INTRAVENOUS | Status: AC
  Administered 2020-04-16: 0.25 mg via INTRAVENOUS
  Filled 2020-04-16: qty 5

## 2020-04-16 NOTE — Assessment & Plan Note (Addendum)
#   STAGE IV-High-grade serous carcinoma endometrial] pre-treatment-CEA 125 +1500; s/p 7cycles of Carbo-Taxol; s/p debulking surgery with bowel resections.   # proceed with cycle #8 CarboTaxol labs today reviewed;  acceptable for treatment today.  Reviewed with the patient that unfortunately she had significant bulky disease even after 7 cycles of chemotherapy.  Unfortunately this is a poor prognostic sign.  Also discussed with Dr. Theora Gianotti.  # Myalgias-likely secondary to Taxol; improved on steroids/see above.STABLE.    # Nausea- Anxiety- STABLE;  continue ativan prn.  # Ileostomy bag-/mucous fistula-   # fatiue- depression-increase prozac to 40 mg/day   # DISPOSITION: #  Chemo today; # labs- in 10 days; possible IVFs over 1 hour # Follow up in 3 weeks- MD; labs- cbc/cmp/ca-125; carbo-taxol- Dr.B

## 2020-04-16 NOTE — Progress Notes (Signed)
[facility] CONSULT NOTE  Patient Care Team: Danelle Berry, NP as PCP - General (Nurse Practitioner) Clent Jacks, RN as Oncology Nurse Navigator  CHIEF COMPLAINTS/PURPOSE OF CONSULTATION: high grade serous cancer   #  Oncology History Overview Note  #August 2021-ascitic fluid cytology-high-grade serous carcinoma of gynecologic origin; CT scan-behind the abdominal wall 4.6 x 2.1 mass ; within the mesentery 6.7 x 4.5 cm left of mid abdomen . BIOPSY of the peritoneal mass; high grade carcinoma [phone] [Dr.Byrnett]; SEP 9th 2021- PET scan-shows peritoneal carcinomatosis; uterine uptake [Dr.Secord; unable to biopsy cervical stenosis] sigmoid colonic uptake concerning for malignancy.  No ovarian uptake.  CEA 125 +1300; CEA-2.    #Hepatitis C/ectopic pregnancy/PRBC transfusion 1995-untreated.[Dr.Vanga]  # 09/14/2019-neoadjuvant CARBO-TAXOL s/p #4 cycles-partial response; DEC 9th 2021-laparoscopy-surgery aborted given the need for multiple bowel resections; DEc 22nd, 2021-proceed with neoadjuvant carbotaxol; STARTING cycle #6- AUC-5 sec to persistent thrombocytopenia.  #Status post #7 CarboTaxol -March 2022-debulking surgery TAH/BSO; bowel resections; ileostomy/mucous fistula.  # April 13th-restart CarboTaxol No. 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 GENETICS- NEG; HRD-    # PALLIATIVE CARE EVALUATION:  # PAIN MANAGEMENT:    DIAGNOSIS: Uterine cancer  STAGE:   IV      ;  GOALS: control  CURRENT/MOST RECENT THERAPY : Carbotaxol    Uterine cancer ([place]) (Resolved)  09/04/2019 Initial Diagnosis   Gynecologic cancer [facility])   Endometrial cancer ([place])  09/14/2019 -  Chemotherapy    Patient is on Treatment Plan: OVARIAN CARBOPLATIN (AUC 6) / PACLITAXEL (175) Q21D X 6 CYCLES      10/05/2019 Initial Diagnosis   Endometrial cancer ([facility])     Genetic Testing   Negative germline genetic testing. No pathogenic variants identified on the [facility] panel. The report date is 10/01/2019. HRD testing was ordered but cancelled due to not enough sample/endometrial diagnosis.   The [facility] gene panel offered by [employer] includes sequencing and deletion/duplication testing of the following 35 genes: APC, ATM, AXIN2, BARD1, BMPR1A, BRCA1, BRCA2, BRIP1, CHD1, CDK4, CDKN2A, CHEK2, EPCAM (large rearrangement only), HOXB13, GALNT12, MLH1, MSH2, MSH3, MSH6, MUTYH, NBN, NTHL1, PALB2, PMS2, PTEN, RAD51C, RAD51D, RNF43, RPS20, SMAD4, STK11, and TP53. Sequencing was performed for select regions of POLE and POLD1, and large rearrangement analysis was performed for select regions of GREM1.    01/16/2020 Cancer Staging   Staging form: Corpus Uteri - Carcinoma and Carcinosarcoma, AJCC 8th Edition - Clinical: Stage IVB (cM1) - Signed by Cammie Sickle, MD on 01/16/2020      HISTORY OF PRESENTING ILLNESS:  Jeanette Allen 65 y.o.  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  Unfortunately had surgery even though she had a good response on imaging noted to have significant bulky disease.  She is recovering fairly well from surgery.  Today patient denies any significant nausea vomiting abdominal pain.  No worsening constipation.  No worsening tingling and numbness.  Review of Systems  Constitutional: Positive for malaise/fatigue. Negative for chills, diaphoresis, fever and weight loss.  HENT: Negative for nosebleeds and sore throat.   Eyes: Negative for double vision.  Respiratory: Negative for cough, hemoptysis, sputum production, shortness of breath and wheezing.   Cardiovascular: Negative for chest pain, palpitations,  orthopnea and leg  swelling.  Gastrointestinal: Positive for constipation. Negative for blood in stool, diarrhea, heartburn, melena and vomiting.  Genitourinary: Negative for dysuria, frequency and urgency.  Musculoskeletal: Positive for myalgias. Negative for back pain and joint pain.  Skin: Negative.  Negative for itching and rash.  Neurological: Negative for dizziness, tingling, focal weakness, weakness and headaches.  Endo/Heme/Allergies: Does not bruise/bleed easily.  Psychiatric/Behavioral: Negative for depression. The patient is not nervous/anxious and does not have insomnia.      MEDICAL HISTORY:  Past Medical History:  Diagnosis Date  . Arthritis   . Cancer ([place])   . Hypertension     SURGICAL HISTORY: Past Surgical History:  Procedure Laterality Date  . APPENDECTOMY    . COLONOSCOPY WITH PROPOFOL N/A 10/01/2019   Procedure: COLONOSCOPY WITH PROPOFOL;  Surgeon: Lin Landsman, MD;  Location: [facility] ENDOSCOPY;  Service: Gastroenterology;  Laterality: N/A;  . ESOPHAGOGASTRODUODENOSCOPY (EGD) WITH PROPOFOL N/A 09/12/2019   Procedure: ESOPHAGOGASTRODUODENOSCOPY (EGD) WITH PROPOFOL;  Surgeon: Lin Landsman, MD;  Location: [facility] ENDOSCOPY;  Service: Gastroenterology;  Laterality: N/A;    SOCIAL HISTORY: Social History   Socioeconomic History  . Marital status: Married    Spouse name: Not on file  . Number of children: Not on file  . Years of education: Not on file  . Highest education level: Not on file  Occupational History  . Not on file  Tobacco Use  . Smoking status: Current Every Day Smoker  . Smokeless tobacco: Never Used  Vaping Use  . Vaping Use: Never used  Substance and Sexual Activity  . Alcohol use: Not Currently  . Drug use: Never  . Sexual activity: Yes  Other Topics Concern  . Not on file  Social History Narrative   Lives with husband; near [place]. Worked Network engineer at [employer]; smoke 1/2 ppd/slowed; no alcohol; 3 children [2 boys; one girl-  [employer]]   Social Determinants of Health   Financial Resource Strain: Not on file  Food Insecurity: Not on file  Transportation Needs: Not on file  Physical Activity: Not on file  Stress: Not on file  Social Connections: Not on file  Intimate Partner Violence: Not on file    FAMILY HISTORY: Family History  Problem Relation Age of Onset  . Diabetes Maternal Grandmother   . Hypertension Maternal Grandmother   . Stroke Maternal Grandmother     ALLERGIES:  has No Known Allergies.  MEDICATIONS:  Current Outpatient Medications  Medication Sig Dispense Refill  . acetaminophen (TYLENOL) 500 MG tablet Take by mouth.    . enoxaparin (LOVENOX) 40 MG/0.4ML injection Inject into the skin.    Marland Kitchen ergocalciferol (VITAMIN D2) 1.25 MG (50000 UT) capsule Take by mouth.    Marland Kitchen FLUoxetine (PROZAC) 40 MG capsule Take 1 capsule (40 mg total) by mouth daily. 30 capsule 3  . losartan (COZAAR) 50 MG tablet Take by mouth.    . ondansetron (ZOFRAN) 8 MG tablet One pill every 8 hours as needed for nausea/vomitting. 60 tablet 1  . ondansetron (ZOFRAN-ODT) 4 MG disintegrating tablet Take 4 mg by mouth every 8 (eight) hours as needed for nausea or vomiting. (Patient not taking: Reported on 04/16/2020)    . oxyCODONE (OXY IR/ROXICODONE) 5 MG immediate release tablet Take by mouth. (Patient not taking: No sig reported)    . pantoprazole (PROTONIX) 40 MG tablet Take by mouth. (Patient not taking: Reported on 04/16/2020)    . polyethylene glycol powder (GLYCOLAX/MIRALAX) 17 GM/SCOOP powder Take by mouth. (Patient not taking: No  sig reported)    . prochlorperazine (COMPAZINE) 10 MG tablet Take 1 tablet (10 mg total) by mouth every 6 (six) hours as needed for nausea or vomiting. (Patient not taking: Reported on 04/16/2020) 40 tablet 1   No current facility-administered medications for this visit.      Marland Kitchen  PHYSICAL EXAMINATION: ECOG PERFORMANCE STATUS: 1 - Symptomatic but completely ambulatory  Vitals:    04/16/20 0856  BP: 121/75  Pulse: 82  Resp: 18  Temp: (!) 96.6 F (35.9 C)  SpO2: 100%   Filed Weights   04/16/20 0856  Weight: 147 lb 3.2 oz (66.8 kg)    Physical Exam Constitutional:      Comments: Ambulating independently.  Accompanied by husband.  HENT:     Head: Normocephalic and atraumatic.     Mouth/Throat:     Pharynx: No oropharyngeal exudate.  Eyes:     Pupils: Pupils are equal, round, and reactive to light.  Cardiovascular:     Rate and Rhythm: Normal rate and regular rhythm.  Pulmonary:     Effort: Pulmonary effort is normal. No respiratory distress.     Breath sounds: Normal breath sounds. No wheezing.  Abdominal:     General: Bowel sounds are normal. There is distension.     Palpations: Abdomen is soft. There is no mass.     Tenderness: There is no abdominal tenderness. There is no guarding or rebound.  Musculoskeletal:        General: No tenderness. Normal range of motion.     Cervical back: Normal range of motion and neck supple.  Skin:    General: Skin is warm.  Neurological:     Mental Status: She is alert and oriented to person, place, and time.  Psychiatric:        Mood and Affect: Affect normal.      LABORATORY DATA:  I have reviewed the data as listed Lab Results  Component Value Date   WBC 4.8 04/16/2020   HGB 11.4 (L) 04/16/2020   HCT 32.6 (L) 04/16/2020   MCV 106.5 (H) 04/16/2020   PLT 206 04/16/2020   Recent Labs    09/14/19 0813 09/28/19 1006 10/05/19 0807 10/15/19 1107 02/19/20 0834 03/05/20 1307 04/16/20 0813  NA 139 138 140   < > 140 139 138  K 4.0 4.0 4.3   < > 4.2 3.7 4.1  CL 104 104 104   < > 106 106 107  CO2 '24 26 27   ' < > '25 25 22  ' GLUCOSE 104* 106* 108*   < > 92 117* 98  BUN '12 14 13   ' < > 16 13 25*  CREATININE 0.93 0.73 0.86   < > 0.89 0.95 0.90  CALCIUM 9.2 9.0 9.9   < > 9.6 9.4 10.2  GFRNONAA >60 >60 >60   < > >60 >60 >60  GFRAA >60 >60 >60  --   --   --   --   PROT 7.5  --  7.6   < > 7.7 6.9 8.0  ALBUMIN  3.5  --  4.0   < > 3.9 3.8 4.2  AST 30  --  37   < > 86* 67* 23  ALT 21  --  30   < > 129* 86* 21  ALKPHOS 71  --  72   < > 79 71 62  BILITOT 0.5  --  0.5   < > 0.6 0.6 0.6   < > = values  in this interval not displayed.    RADIOGRAPHIC STUDIES: I have personally reviewed the radiological images as listed and agreed with the findings in the report. No results found.  ASSESSMENT & PLAN:   Endometrial cancer ([place]) # STAGE IV-High-grade serous carcinoma endometrial] pre-treatment-CEA 125 +1500; s/p 7cycles of Carbo-Taxol; s/p debulking surgery with bowel resections.   # proceed with cycle #8 CarboTaxol labs today reviewed;  acceptable for treatment today.  Reviewed with the patient that unfortunately she had significant bulky disease even after 7 cycles of chemotherapy.  Unfortunately this is a poor prognostic sign.  Also discussed with Dr. Theora Gianotti.  # Myalgias-likely secondary to Taxol; improved on steroids/see above.STABLE.    # Nausea- Anxiety- STABLE;  continue ativan prn.  # Ileostomy bag-/mucous fistula-   # fatiue- depression-increase prozac to 40 mg/day   # DISPOSITION: #  Chemo today; # labs- in 10 days; possible IVFs over 1 hour # Follow up in 3 weeks- MD; labs- cbc/cmp/ca-125; carbo-taxol- Dr.B    All questions were answered. The patient knows to call the clinic with any problems, questions or concerns.   Cammie Sickle, MD 04/22/2020 12:06 PM

## 2020-04-16 NOTE — Progress Notes (Signed)
Pt in for follow up, has ileostomy, had hysterectomy on 03/27/20.

## 2020-04-17 LAB — CA 125: Cancer Antigen (CA) 125: 111 U/mL — ABNORMAL HIGH (ref 0.0–38.1)

## 2020-04-25 ENCOUNTER — Inpatient Hospital Stay: Payer: Medicare Other

## 2020-04-25 ENCOUNTER — Other Ambulatory Visit: Payer: Self-pay

## 2020-04-25 VITALS — BP 109/79 | HR 79 | Temp 97.7°F

## 2020-04-25 DIAGNOSIS — C541 Malignant neoplasm of endometrium: Secondary | ICD-10-CM

## 2020-04-25 DIAGNOSIS — E860 Dehydration: Secondary | ICD-10-CM

## 2020-04-25 DIAGNOSIS — Z5111 Encounter for antineoplastic chemotherapy: Secondary | ICD-10-CM | POA: Diagnosis not present

## 2020-04-25 LAB — CBC WITH DIFFERENTIAL/PLATELET
Abs Immature Granulocytes: 0.02 10*3/uL (ref 0.00–0.07)
Basophils Absolute: 0 10*3/uL (ref 0.0–0.1)
Basophils Relative: 1 %
Eosinophils Absolute: 0.1 10*3/uL (ref 0.0–0.5)
Eosinophils Relative: 1 %
HCT: 31.9 % — ABNORMAL LOW (ref 36.0–46.0)
Hemoglobin: 10.9 g/dL — ABNORMAL LOW (ref 12.0–15.0)
Immature Granulocytes: 0 %
Lymphocytes Relative: 23 %
Lymphs Abs: 1.2 10*3/uL (ref 0.7–4.0)
MCH: 36.7 pg — ABNORMAL HIGH (ref 26.0–34.0)
MCHC: 34.2 g/dL (ref 30.0–36.0)
MCV: 107.4 fL — ABNORMAL HIGH (ref 80.0–100.0)
Monocytes Absolute: 0.4 10*3/uL (ref 0.1–1.0)
Monocytes Relative: 7 %
Neutro Abs: 3.4 10*3/uL (ref 1.7–7.7)
Neutrophils Relative %: 68 %
Platelets: 126 10*3/uL — ABNORMAL LOW (ref 150–400)
RBC: 2.97 MIL/uL — ABNORMAL LOW (ref 3.87–5.11)
RDW: 12.5 % (ref 11.5–15.5)
WBC: 5.1 10*3/uL (ref 4.0–10.5)
nRBC: 0 % (ref 0.0–0.2)

## 2020-04-25 LAB — BASIC METABOLIC PANEL
Anion gap: 9 (ref 5–15)
BUN: 24 mg/dL — ABNORMAL HIGH (ref 8–23)
CO2: 20 mmol/L — ABNORMAL LOW (ref 22–32)
Calcium: 9.9 mg/dL (ref 8.9–10.3)
Chloride: 106 mmol/L (ref 98–111)
Creatinine, Ser: 0.86 mg/dL (ref 0.44–1.00)
GFR, Estimated: >60 mL/min (ref >60)
Glucose, Bld: 97 mg/dL (ref 70–99)
Potassium: 4.6 mmol/L (ref 3.5–5.1)
Sodium: 135 mmol/L (ref 135–145)

## 2020-04-25 MED ORDER — SODIUM CHLORIDE 0.9 % IV SOLN
Freq: Once | INTRAVENOUS | Status: AC
  Filled 2020-04-25: qty 250

## 2020-04-25 NOTE — Progress Notes (Signed)
Pt in clinic today for labs and IVF's for hydration. She reports feeling well. Healing well from surgery, hysterectomy. Discharged to home at completion of fluids.

## 2020-05-07 ENCOUNTER — Inpatient Hospital Stay: Payer: Medicare Other | Attending: Internal Medicine

## 2020-05-07 ENCOUNTER — Encounter: Payer: Self-pay | Admitting: Internal Medicine

## 2020-05-07 ENCOUNTER — Inpatient Hospital Stay: Payer: Medicare Other

## 2020-05-07 ENCOUNTER — Inpatient Hospital Stay (HOSPITAL_BASED_OUTPATIENT_CLINIC_OR_DEPARTMENT_OTHER): Payer: Medicare Other | Admitting: Internal Medicine

## 2020-05-07 ENCOUNTER — Other Ambulatory Visit: Payer: Self-pay

## 2020-05-07 DIAGNOSIS — Z7189 Other specified counseling: Secondary | ICD-10-CM

## 2020-05-07 DIAGNOSIS — T451X5A Adverse effect of antineoplastic and immunosuppressive drugs, initial encounter: Secondary | ICD-10-CM | POA: Diagnosis not present

## 2020-05-07 DIAGNOSIS — Z5111 Encounter for antineoplastic chemotherapy: Secondary | ICD-10-CM | POA: Diagnosis not present

## 2020-05-07 DIAGNOSIS — R5383 Other fatigue: Secondary | ICD-10-CM | POA: Diagnosis not present

## 2020-05-07 DIAGNOSIS — R110 Nausea: Secondary | ICD-10-CM | POA: Insufficient documentation

## 2020-05-07 DIAGNOSIS — I10 Essential (primary) hypertension: Secondary | ICD-10-CM | POA: Insufficient documentation

## 2020-05-07 DIAGNOSIS — C541 Malignant neoplasm of endometrium: Secondary | ICD-10-CM

## 2020-05-07 DIAGNOSIS — Z79899 Other long term (current) drug therapy: Secondary | ICD-10-CM | POA: Insufficient documentation

## 2020-05-07 DIAGNOSIS — R5381 Other malaise: Secondary | ICD-10-CM | POA: Insufficient documentation

## 2020-05-07 DIAGNOSIS — D701 Agranulocytosis secondary to cancer chemotherapy: Secondary | ICD-10-CM | POA: Insufficient documentation

## 2020-05-07 DIAGNOSIS — F17210 Nicotine dependence, cigarettes, uncomplicated: Secondary | ICD-10-CM | POA: Diagnosis not present

## 2020-05-07 DIAGNOSIS — F32A Depression, unspecified: Secondary | ICD-10-CM | POA: Insufficient documentation

## 2020-05-07 LAB — COMPREHENSIVE METABOLIC PANEL
ALT: 30 U/L (ref 0–44)
AST: 27 U/L (ref 15–41)
Albumin: 4.4 g/dL (ref 3.5–5.0)
Alkaline Phosphatase: 84 U/L (ref 38–126)
Anion gap: 9 (ref 5–15)
BUN: 35 mg/dL — ABNORMAL HIGH (ref 8–23)
CO2: 19 mmol/L — ABNORMAL LOW (ref 22–32)
Calcium: 10.6 mg/dL — ABNORMAL HIGH (ref 8.9–10.3)
Chloride: 108 mmol/L (ref 98–111)
Creatinine, Ser: 1.3 mg/dL — ABNORMAL HIGH (ref 0.44–1.00)
GFR, Estimated: 46 mL/min — ABNORMAL LOW (ref >60)
Glucose, Bld: 101 mg/dL — ABNORMAL HIGH (ref 70–99)
Potassium: 5.3 mmol/L — ABNORMAL HIGH (ref 3.5–5.1)
Sodium: 136 mmol/L (ref 135–145)
Total Bilirubin: 0.6 mg/dL (ref 0.3–1.2)
Total Protein: 8.5 g/dL — ABNORMAL HIGH (ref 6.5–8.1)

## 2020-05-07 LAB — CBC WITH DIFFERENTIAL/PLATELET
Abs Immature Granulocytes: 0.05 10*3/uL (ref 0.00–0.07)
Basophils Absolute: 0 10*3/uL (ref 0.0–0.1)
Basophils Relative: 1 %
Eosinophils Absolute: 0.1 10*3/uL (ref 0.0–0.5)
Eosinophils Relative: 2 %
HCT: 36.7 % (ref 36.0–46.0)
Hemoglobin: 12.9 g/dL (ref 12.0–15.0)
Immature Granulocytes: 1 %
Lymphocytes Relative: 20 %
Lymphs Abs: 1.2 10*3/uL (ref 0.7–4.0)
MCH: 37.2 pg — ABNORMAL HIGH (ref 26.0–34.0)
MCHC: 35.1 g/dL (ref 30.0–36.0)
MCV: 105.8 fL — ABNORMAL HIGH (ref 80.0–100.0)
Monocytes Absolute: 0.6 10*3/uL (ref 0.1–1.0)
Monocytes Relative: 10 %
Neutro Abs: 4 10*3/uL (ref 1.7–7.7)
Neutrophils Relative %: 66 %
Platelets: 80 10*3/uL — ABNORMAL LOW (ref 150–400)
RBC: 3.47 MIL/uL — ABNORMAL LOW (ref 3.87–5.11)
RDW: 13.1 % (ref 11.5–15.5)
WBC: 6 10*3/uL (ref 4.0–10.5)
nRBC: 0 % (ref 0.0–0.2)

## 2020-05-07 MED ORDER — DIPHENHYDRAMINE HCL 50 MG/ML IJ SOLN
50.0000 mg | Freq: Once | INTRAMUSCULAR | Status: AC
  Administered 2020-05-07: 50 mg via INTRAVENOUS
  Filled 2020-05-07: qty 1

## 2020-05-07 MED ORDER — OMEPRAZOLE 40 MG PO CPDR: 40.0000 mg | DELAYED_RELEASE_CAPSULE | Freq: Two times a day (BID) | ORAL | 1 refills | Status: DC

## 2020-05-07 MED ORDER — SODIUM CHLORIDE 0.9 % IV SOLN
348.5000 mg | Freq: Once | INTRAVENOUS | Status: AC
  Administered 2020-05-07: 350 mg via INTRAVENOUS
  Filled 2020-05-07: qty 35

## 2020-05-07 MED ORDER — PROCHLORPERAZINE MALEATE 10 MG PO TABS: 10.0000 mg | ORAL_TABLET | Freq: Four times a day (QID) | ORAL | 1 refills | Status: DC | PRN

## 2020-05-07 MED ORDER — SODIUM CHLORIDE 0.9 % IV SOLN
140.0000 mg/m2 | Freq: Once | INTRAVENOUS | Status: AC
  Administered 2020-05-07: 258 mg via INTRAVENOUS
  Filled 2020-05-07: qty 43

## 2020-05-07 MED ORDER — FAMOTIDINE 20 MG IN NS 100 ML IVPB
20.0000 mg | Freq: Once | INTRAVENOUS | Status: AC
  Administered 2020-05-07: 20 mg via INTRAVENOUS
  Filled 2020-05-07: qty 20

## 2020-05-07 MED ORDER — SODIUM CHLORIDE 0.9 % IV SOLN
Freq: Once | INTRAVENOUS | Status: AC
  Filled 2020-05-07: qty 250

## 2020-05-07 MED ORDER — PALONOSETRON HCL INJECTION 0.25 MG/5ML
0.2500 mg | Freq: Once | INTRAVENOUS | Status: AC
  Administered 2020-05-07: 0.25 mg via INTRAVENOUS
  Filled 2020-05-07: qty 5

## 2020-05-07 MED ORDER — SODIUM CHLORIDE 0.9% FLUSH
10.0000 mL | INTRAVENOUS | Status: DC | PRN
  Filled 2020-05-07: qty 10

## 2020-05-07 MED ORDER — DEXAMETHASONE SODIUM PHOSPHATE 100 MG/10ML IJ SOLN
10.0000 mg | Freq: Once | INTRAMUSCULAR | Status: AC
Start: 2020-05-07 — End: 2020-05-07
  Administered 2020-05-07: 10 mg via INTRAVENOUS
  Filled 2020-05-07: qty 10

## 2020-05-07 MED ORDER — SODIUM CHLORIDE 0.9 % IV SOLN
150.0000 mg | Freq: Once | INTRAVENOUS | Status: AC
Start: 2020-05-07 — End: 2020-05-07
  Administered 2020-05-07: 150 mg via INTRAVENOUS
  Filled 2020-05-07: qty 150

## 2020-05-07 MED ORDER — LORAZEPAM 0.5 MG PO TABS
0.5000 mg | ORAL_TABLET | Freq: Once | ORAL | Status: AC
  Administered 2020-05-07: 0.5 mg via ORAL
  Filled 2020-05-07: qty 1

## 2020-05-07 NOTE — Progress Notes (Signed)
Has been having really bad nausea the past 2 weeks. States it gets so bad it makes it hard for her to breathe.

## 2020-05-07 NOTE — Assessment & Plan Note (Addendum)
#   STAGE IV-High-grade serous carcinoma endometrial] pre-treatment-CEA 125 +1500; s/p 7cycles of Carbo-Taxol; s/p debulking surgery with bowel resections.  Ca-125 rising; monitor closely. Will plan CT if getting worse.   # proceed with cycle # 9 CarboTaxol labs today reviewed; platelets 80 [carboplatin AUC 5; Taxol decrease dose to 80%]; monitor closely.  Adjust the dose of carboplatin.  #Creatinine 1.3/hyperkalemia-potassium 5.3; recommend increase hydration; holding Cozaar.  Blood pressures ~120s 130s.  # Nausea-underlying disease versus GI causes/chemo; add prilosec BID; Compazine/Zofran.  # fatiue- depression-stable on prozac to 40 mg/day   # DISPOSITION: #  Chemo today; # labs- in 10 days; possible IVFs over 1 hour # Follow up in 3 weeks- MD; labs- cbc/cmp/ca-125; carbo-taxol- Dr.B

## 2020-05-07 NOTE — Patient Instructions (Signed)
[facility] [place]  Discharge Instructions: Thank you for choosing [facility] to provide your oncology and hematology care.  If you have a lab appointment with the Cancer Center, please go directly to the Cancer Center and check in at the registration area.  Wear comfortable clothing and clothing appropriate for easy access to any Portacath or PICC line.   We strive to give you quality time with your provider. You may need to reschedule your appointment if you arrive late (15 or more minutes).  Arriving late affects you and other patients whose appointments are after yours.  Also, if you miss three or more appointments without notifying the office, you may be dismissed from the clinic at the provider's discretion.      For prescription refill requests, have your pharmacy contact our office and allow 72 hours for refills to be completed.    Today you received the following chemotherapy and/or immunotherapy agents - paclitaxel, carboplatin      To help prevent nausea and vomiting after your treatment, we encourage you to take your nausea medication as directed.  BELOW ARE SYMPTOMS THAT SHOULD BE REPORTED IMMEDIATELY: . *FEVER GREATER THAN 100.4 F (38 C) OR HIGHER . *CHILLS OR SWEATING . *NAUSEA AND VOMITING THAT IS NOT CONTROLLED WITH YOUR NAUSEA MEDICATION . *UNUSUAL SHORTNESS OF BREATH . *UNUSUAL BRUISING OR BLEEDING . *URINARY PROBLEMS (pain or burning when urinating, or frequent urination) . *BOWEL PROBLEMS (unusual diarrhea, constipation, pain near the anus) . TENDERNESS IN MOUTH AND THROAT WITH OR WITHOUT PRESENCE OF ULCERS (sore throat, sores in mouth, or a toothache) . UNUSUAL RASH, SWELLING OR PAIN  . UNUSUAL VAGINAL DISCHARGE OR ITCHING   Items with * indicate a potential emergency and should be followed up as soon as possible or go to the Emergency Department if any problems should occur.  Please show the CHEMOTHERAPY ALERT CARD or  IMMUNOTHERAPY ALERT CARD at check-in to the Emergency Department and triage nurse.  Should you have questions after your visit or need to cancel or reschedule your appointment, please contact [facility]  [phone] and follow the prompts.  Office hours are 8:00 a.m. to 4:30 p.m. Monday - Friday. Please note that voicemails left after 4:00 p.m. may not be returned until the following business day.  We are closed weekends and major holidays. You have access to a nurse at all times for urgent questions. Please call the main number to the clinic [phone] and follow the prompts.  For any non-urgent questions, you may also contact your provider using MyChart. We now offer e-Visits for anyone 18 and older to request care online for non-urgent symptoms. For details visit [email or web address].   Also download the MyChart app! Go to the app store, search "MyChart", open the app, select , and log in with your MyChart username and password.  Due to Covid, a mask is required upon entering the hospital/clinic. If you do not have a mask, one will be given to you upon arrival. For doctor visits, patients may have 1 support person aged 18 or older with them. For treatment visits, patients cannot have anyone with them due to current Covid guidelines and our immunocompromised population.   Paclitaxel injection What is this medicine? PACLITAXEL (PAK li TAX el) is a chemotherapy drug. It targets fast dividing cells, like cancer cells, and causes these cells to die. This medicine is used to treat ovarian cancer, breast cancer, lung cancer, Kaposi's sarcoma,   and other cancers. This medicine may be used for other purposes; ask your health care provider or pharmacist if you have questions. COMMON BRAND NAME(S): Onxol, Taxol What should I tell my health care provider before I take this medicine? They need to know if you have any of these conditions:  history of  irregular heartbeat  liver disease  low blood counts, like low white cell, platelet, or red cell counts  lung or breathing disease, like asthma  tingling of the fingers or toes, or other nerve disorder  an unusual or allergic reaction to paclitaxel, alcohol, polyoxyethylated castor oil, other chemotherapy, other medicines, foods, dyes, or preservatives  pregnant or trying to get pregnant  breast-feeding How should I use this medicine? This drug is given as an infusion into a vein. It is administered in a hospital or clinic by a specially trained health care professional. Talk to your pediatrician regarding the use of this medicine in children. Special care may be needed. Overdosage: If you think you have taken too much of this medicine contact a poison control center or emergency room at once. NOTE: This medicine is only for you. Do not share this medicine with others. What if I miss a dose? It is important not to miss your dose. Call your doctor or health care professional if you are unable to keep an appointment. What may interact with this medicine? Do not take this medicine with any of the following medications:  live virus vaccines This medicine may also interact with the following medications:  antiviral medicines for hepatitis, HIV or AIDS  certain antibiotics like erythromycin and clarithromycin  certain medicines for fungal infections like ketoconazole and itraconazole  certain medicines for seizures like carbamazepine, phenobarbital, phenytoin  gemfibrozil  nefazodone  rifampin  St. John's wort This list may not describe all possible interactions. Give your health care provider a list of all the medicines, herbs, non-prescription drugs, or dietary supplements you use. Also tell them if you smoke, drink alcohol, or use illegal drugs. Some items may interact with your medicine. What should I watch for while using this medicine? Your condition will be monitored  carefully while you are receiving this medicine. You will need important blood work done while you are taking this medicine. This medicine can cause serious allergic reactions. To reduce your risk you will need to take other medicine(s) before treatment with this medicine. If you experience allergic reactions like skin rash, itching or hives, swelling of the face, lips, or tongue, tell your doctor or health care professional right away. In some cases, you may be given additional medicines to help with side effects. Follow all directions for their use. This drug may make you feel generally unwell. This is not uncommon, as chemotherapy can affect healthy cells as well as cancer cells. Report any side effects. Continue your course of treatment even though you feel ill unless your doctor tells you to stop. Call your doctor or health care professional for advice if you get a fever, chills or sore throat, or other symptoms of a cold or flu. Do not treat yourself. This drug decreases your body's ability to fight infections. Try to avoid being around people who are sick. This medicine may increase your risk to bruise or bleed. Call your doctor or health care professional if you notice any unusual bleeding. Be careful brushing and flossing your teeth or using a toothpick because you may get an infection or bleed more easily. If you have any dental work   done, tell your dentist you are receiving this medicine. Avoid taking products that contain aspirin, acetaminophen, ibuprofen, naproxen, or ketoprofen unless instructed by your doctor. These medicines may hide a fever. Do not become pregnant while taking this medicine. Women should inform their doctor if they wish to become pregnant or think they might be pregnant. There is a potential for serious side effects to an unborn child. Talk to your health care professional or pharmacist for more information. Do not breast-feed an infant while taking this medicine. Men are  advised not to father a child while receiving this medicine. This product may contain alcohol. Ask your pharmacist or healthcare provider if this medicine contains alcohol. Be sure to tell all healthcare providers you are taking this medicine. Certain medicines, like metronidazole and disulfiram, can cause an unpleasant reaction when taken with alcohol. The reaction includes flushing, headache, nausea, vomiting, sweating, and increased thirst. The reaction can last from 30 minutes to several hours. What side effects may I notice from receiving this medicine? Side effects that you should report to your doctor or health care professional as soon as possible:  allergic reactions like skin rash, itching or hives, swelling of the face, lips, or tongue  breathing problems  changes in vision  fast, irregular heartbeat  high or low blood pressure  mouth sores  pain, tingling, numbness in the hands or feet  signs of decreased platelets or bleeding - bruising, pinpoint red spots on the skin, black, tarry stools, blood in the urine  signs of decreased red blood cells - unusually weak or tired, feeling faint or lightheaded, falls  signs of infection - fever or chills, cough, sore throat, pain or difficulty passing urine  signs and symptoms of liver injury like dark yellow or brown urine; general ill feeling or flu-like symptoms; light-colored stools; loss of appetite; nausea; right upper belly pain; unusually weak or tired; yellowing of the eyes or skin  swelling of the ankles, feet, hands  unusually slow heartbeat Side effects that usually do not require medical attention (report to your doctor or health care professional if they continue or are bothersome):  diarrhea  hair loss  loss of appetite  muscle or joint pain  nausea, vomiting  pain, redness, or irritation at site where injected  tiredness This list may not describe all possible side effects. Call your doctor for medical  advice about side effects. You may report side effects to FDA at 1-800-FDA-1088. Where should I keep my medicine? This drug is given in a hospital or clinic and will not be stored at home. NOTE: This sheet is a summary. It may not cover all possible information. If you have questions about this medicine, talk to your doctor, pharmacist, or health care provider.  2021 Elsevier/Gold Standard (2018-11-22 13:37:23)  Carboplatin injection What is this medicine? CARBOPLATIN (KAR boe pla tin) is a chemotherapy drug. It targets fast dividing cells, like cancer cells, and causes these cells to die. This medicine is used to treat ovarian cancer and many other cancers. This medicine may be used for other purposes; ask your health care provider or pharmacist if you have questions. COMMON BRAND NAME(S): Paraplatin What should I tell my health care provider before I take this medicine? They need to know if you have any of these conditions:  blood disorders  hearing problems  kidney disease  recent or ongoing radiation therapy  an unusual or allergic reaction to carboplatin, cisplatin, other chemotherapy, other medicines, foods, dyes, or preservatives    pregnant or trying to get pregnant  breast-feeding How should I use this medicine? This drug is usually given as an infusion into a vein. It is administered in a hospital or clinic by a specially trained health care professional. Talk to your pediatrician regarding the use of this medicine in children. Special care may be needed. Overdosage: If you think you have taken too much of this medicine contact a poison control center or emergency room at once. NOTE: This medicine is only for you. Do not share this medicine with others. What if I miss a dose? It is important not to miss a dose. Call your doctor or health care professional if you are unable to keep an appointment. What may interact with this medicine?  medicines for seizures  medicines  to increase blood counts like filgrastim, pegfilgrastim, sargramostim  some antibiotics like amikacin, gentamicin, neomycin, streptomycin, tobramycin  vaccines Talk to your doctor or health care professional before taking any of these medicines:  acetaminophen  aspirin  ibuprofen  ketoprofen  naproxen This list may not describe all possible interactions. Give your health care provider a list of all the medicines, herbs, non-prescription drugs, or dietary supplements you use. Also tell them if you smoke, drink alcohol, or use illegal drugs. Some items may interact with your medicine. What should I watch for while using this medicine? Your condition will be monitored carefully while you are receiving this medicine. You will need important blood work done while you are taking this medicine. This drug may make you feel generally unwell. This is not uncommon, as chemotherapy can affect healthy cells as well as cancer cells. Report any side effects. Continue your course of treatment even though you feel ill unless your doctor tells you to stop. In some cases, you may be given additional medicines to help with side effects. Follow all directions for their use. Call your doctor or health care professional for advice if you get a fever, chills or sore throat, or other symptoms of a cold or flu. Do not treat yourself. This drug decreases your body's ability to fight infections. Try to avoid being around people who are sick. This medicine may increase your risk to bruise or bleed. Call your doctor or health care professional if you notice any unusual bleeding. Be careful brushing and flossing your teeth or using a toothpick because you may get an infection or bleed more easily. If you have any dental work done, tell your dentist you are receiving this medicine. Avoid taking products that contain aspirin, acetaminophen, ibuprofen, naproxen, or ketoprofen unless instructed by your doctor. These medicines  may hide a fever. Do not become pregnant while taking this medicine. Women should inform their doctor if they wish to become pregnant or think they might be pregnant. There is a potential for serious side effects to an unborn child. Talk to your health care professional or pharmacist for more information. Do not breast-feed an infant while taking this medicine. What side effects may I notice from receiving this medicine? Side effects that you should report to your doctor or health care professional as soon as possible:  allergic reactions like skin rash, itching or hives, swelling of the face, lips, or tongue  signs of infection - fever or chills, cough, sore throat, pain or difficulty passing urine  signs of decreased platelets or bleeding - bruising, pinpoint red spots on the skin, black, tarry stools, nosebleeds  signs of decreased red blood cells - unusually weak or tired, fainting   spells, lightheadedness  breathing problems  changes in hearing  changes in vision  chest pain  high blood pressure  low blood counts - This drug may decrease the number of white blood cells, red blood cells and platelets. You may be at increased risk for infections and bleeding.  nausea and vomiting  pain, swelling, redness or irritation at the injection site  pain, tingling, numbness in the hands or feet  problems with balance, talking, walking  trouble passing urine or change in the amount of urine Side effects that usually do not require medical attention (report to your doctor or health care professional if they continue or are bothersome):  hair loss  loss of appetite  metallic taste in the mouth or changes in taste This list may not describe all possible side effects. Call your doctor for medical advice about side effects. You may report side effects to FDA at 1-800-FDA-1088. Where should I keep my medicine? This drug is given in a hospital or clinic and will not be stored at  home. NOTE: This sheet is a summary. It may not cover all possible information. If you have questions about this medicine, talk to your doctor, pharmacist, or health care provider.  2021 [employer]vier/Gold Standard (2007-03-28 14:38:05)  

## 2020-05-07 NOTE — Progress Notes (Signed)
[place] CONSULT NOTE  Patient Care Team: Danelle Berry, NP as PCP - General (Nurse Practitioner) Clent Jacks, RN as Oncology Nurse Navigator  CHIEF COMPLAINTS/PURPOSE OF CONSULTATION: high grade serous cancer   #  Oncology History Overview Note  #August 2021-ascitic fluid cytology-high-grade serous carcinoma of gynecologic origin; CT scan-behind the abdominal wall 4.6 x 2.1 mass ; within the mesentery 6.7 x 4.5 cm left of mid abdomen . BIOPSY of the peritoneal mass; high grade carcinoma ca-[phone] [Dr.Byrnett]; SEP 9th 2021- PET scan-shows peritoneal carcinomatosis; uterine uptake [Dr.Secord; unable to biopsy cervical stenosis] sigmoid colonic uptake concerning for malignancy.  No ovarian uptake.  CEA 125 +1300; CEA-2.    #Hepatitis C/ectopic pregnancy/PRBC transfusion 1995-untreated.[Dr.Vanga]  # 09/14/2019-neoadjuvant CARBO-TAXOL s/p #4 cycles-partial response; DEC 9th 2021-laparoscopy-surgery aborted given the need for multiple bowel resections; DEc 22nd, 2021-proceed with neoadjuvant carbotaxol; STARTING cycle #6- AUC-5 sec to persistent thrombocytopenia.  #Status post #7 CarboTaxol -March 2022-debulking surgery TAH/BSO; bowel resections; ileostomy/mucous fistula.  # April 13th-restart CarboTaxol No. 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 GENETICS- NEG; HRD-    # PALLIATIVE CARE EVALUATION:  # PAIN MANAGEMENT:    DIAGNOSIS: Uterine cancer  STAGE:   IV      ;  GOALS: control  CURRENT/MOST RECENT THERAPY : Carbotaxol    Uterine cancer ([place]) (Resolved)  09/04/2019 Initial Diagnosis   Gynecologic cancer [facility])   Endometrial cancer ([place])  09/14/2019 -  Chemotherapy    Patient is on Treatment Plan: OVARIAN CARBOPLATIN (AUC 6) / PACLITAXEL (175) Q21D X 6 CYCLES      10/05/2019 Initial Diagnosis   Endometrial cancer ([facility])     Genetic Testing   Negative germline genetic testing. No pathogenic variants identified on the [facility] panel. The report date is 10/01/2019. HRD testing was ordered but cancelled due to not enough sample/endometrial diagnosis.   The [facility] gene panel offered by [employer] includes sequencing and deletion/duplication testing of the following 35 genes: APC, ATM, AXIN2, BARD1, BMPR1A, BRCA1, BRCA2, BRIP1, CHD1, CDK4, CDKN2A, CHEK2, EPCAM (large rearrangement only), HOXB13, GALNT12, MLH1, MSH2, MSH3, MSH6, MUTYH, NBN, NTHL1, PALB2, PMS2, PTEN, RAD51C, RAD51D, RNF43, RPS20, SMAD4, STK11, and TP53. Sequencing was performed for select regions of POLE and POLD1, and large rearrangement analysis was performed for select regions of GREM1.    01/16/2020 Cancer Staging   Staging form: Corpus Uteri - Carcinoma and Carcinosarcoma, AJCC 8th Edition - Clinical: Stage IVB (cM1) - Signed by Cammie Sickle, MD on 01/16/2020      HISTORY OF PRESENTING ILLNESS:  Jeanette Allen 65 y.o.  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  Patient denies any worsening tingling or numbness.  Denies any constipation.  Denies any worsening abdominal pain.  Complains of intermittent nausea; and reflux-like symptoms.  No vomiting.  Patient is not on antireflux medication.   Review of Systems  Constitutional: Positive for malaise/fatigue. Negative for chills, diaphoresis, fever and weight loss.  HENT: Negative for nosebleeds and sore throat.   Eyes: Negative for double vision.  Respiratory: Negative for cough, hemoptysis, sputum production, shortness of breath and wheezing.   Cardiovascular: Negative for chest pain, palpitations, orthopnea and leg swelling.  Gastrointestinal: Positive for constipation. Negative for blood in stool, diarrhea, heartburn, melena and vomiting.  Genitourinary: Negative  for dysuria, frequency and urgency.  Musculoskeletal:  Positive for myalgias. Negative for back pain and joint pain.  Skin: Negative.  Negative for itching and rash.  Neurological: Negative for dizziness, tingling, focal weakness, weakness and headaches.  Endo/Heme/Allergies: Does not bruise/bleed easily.  Psychiatric/Behavioral: Negative for depression. The patient is not nervous/anxious and does not have insomnia.      MEDICAL HISTORY:  Past Medical History:  Diagnosis Date  . Arthritis   . Cancer ([place])   . Hypertension     SURGICAL HISTORY: Past Surgical History:  Procedure Laterality Date  . APPENDECTOMY    . COLONOSCOPY WITH PROPOFOL N/A 10/01/2019   Procedure: COLONOSCOPY WITH PROPOFOL;  Surgeon: Lin Landsman, MD;  Location[facility] ENDOSCOPY;  Service: Gastroenterology;  Laterality: N/A;  . ESOPHAGOGASTRODUODENOSCOPY (EGD) WITH PROPOFOL N/A 09/12/2019   Procedure: ESOPHAGOGASTRODUODENOSCOPY (EGD) WITH PROPOFOL;  Surgeon: Lin Landsman, MD;  Location: [facility] ENDOSCOPY;  Service: Gastroenterology;  Laterality: N/A;    SOCIAL HISTORY: Social History   Socioeconomic History  . Marital status: Married    Spouse name: Not on file  . Number of children: Not on file  . Years of education: Not on file  . Highest education level: Not on file  Occupational History  . Not on file  Tobacco Use  . Smoking status: Current Every Day Smoker  . Smokeless tobacco: Never Used  Vaping Use  . Vaping Use: Never used  Substance and Sexual Activity  . Alcohol use: Not Currently  . Drug use: Never  . Sexual activity: Yes  Other Topics Concern  . Not on file  Social History Narrative   Lives with husband; near [place]. Worked Network engineer at [employer]; smoke 1/2 ppd/slowed; no alcohol; 3 children [2 boys; one girl- [employer]]   Social Determinants of Health   Financial Resource Strain: Not on file  Food Insecurity: Not on file  Transportation Needs: Not on file  Physical  Activity: Not on file  Stress: Not on file  Social Connections: Not on file  Intimate Partner Violence: Not on file    FAMILY HISTORY: Family History  Problem Relation Age of Onset  . Diabetes Maternal Grandmother   . Hypertension Maternal Grandmother   . Stroke Maternal Grandmother     ALLERGIES:  has No Known Allergies.  MEDICATIONS:  Current Outpatient Medications  Medication Sig Dispense Refill  . acetaminophen (TYLENOL) 500 MG tablet Take by mouth.    . ergocalciferol (VITAMIN D2) 1.25 MG (50000 UT) capsule Take by mouth.    Marland Kitchen FLUoxetine (PROZAC) 40 MG capsule Take 1 capsule (40 mg total) by mouth daily. 30 capsule 3  . losartan (COZAAR) 50 MG tablet Take by mouth.    Marland Kitchen omeprazole (PRILOSEC) 40 MG capsule Take 1 capsule (40 mg total) by mouth 2 (two) times daily before a meal. 60 capsule 1  . ondansetron (ZOFRAN) 8 MG tablet One pill every 8 hours as needed for nausea/vomitting. 60 tablet 1  . ondansetron (ZOFRAN-ODT) 4 MG disintegrating tablet Take 4 mg by mouth every 8 (eight) hours as needed for nausea or vomiting. (Patient not taking: No sig reported)    . oxyCODONE (OXY IR/ROXICODONE) 5 MG immediate release tablet Take by mouth. (Patient not taking: No sig reported)    . polyethylene glycol powder (GLYCOLAX/MIRALAX) 17 GM/SCOOP powder Take by mouth. (Patient not taking: No sig reported)    . prochlorperazine (COMPAZINE) 10 MG tablet Take 1 tablet (10 mg total) by mouth every 6 (six) hours as needed for nausea or vomiting. 40 tablet 1  No current facility-administered medications for this visit.   Facility-Administered Medications Ordered in Other Visits  Medication Dose Route Frequency Provider Last Rate Last Admin  . CARBOplatin (PARAPLATIN) 350 mg in sodium chloride 0.9 % 250 mL chemo infusion  350 mg Intravenous Once Charlaine Dalton R, MD      . PACLitaxel (TAXOL) 258 mg in sodium chloride 0.9 % 250 mL chemo infusion (> 69m/m2)  140 mg/m2 (Treatment Plan  Recorded) Intravenous Once BCammie Sickle MD 98 mL/hr at 05/07/20 1058 258 mg at 05/07/20 1058  . sodium chloride flush (NS) 0.9 % injection 10 mL  10 mL Intracatheter PRN BCammie Sickle MD          .  PHYSICAL EXAMINATION: ECOG PERFORMANCE STATUS: 1 - Symptomatic but completely ambulatory  Vitals:   05/07/20 0829  BP: 113/75  Pulse: 93  Resp: 16  Temp: (!) 96.6 F (35.9 C)  SpO2: 99%   Filed Weights   05/07/20 0829  Weight: 144 lb 9.6 oz (65.6 kg)    Physical Exam Constitutional:      Comments: Ambulating independently.  Accompanied by husband.  HENT:     Head: Normocephalic and atraumatic.     Mouth/Throat:     Pharynx: No oropharyngeal exudate.  Eyes:     Pupils: Pupils are equal, round, and reactive to light.  Cardiovascular:     Rate and Rhythm: Normal rate and regular rhythm.  Pulmonary:     Effort: Pulmonary effort is normal. No respiratory distress.     Breath sounds: Normal breath sounds. No wheezing.  Abdominal:     General: Bowel sounds are normal. There is distension.     Palpations: Abdomen is soft. There is no mass.     Tenderness: There is no abdominal tenderness. There is no guarding or rebound.  Musculoskeletal:        General: No tenderness. Normal range of motion.     Cervical back: Normal range of motion and neck supple.  Skin:    General: Skin is warm.  Neurological:     Mental Status: She is alert and oriented to person, place, and time.  Psychiatric:        Mood and Affect: Affect normal.      LABORATORY DATA:  I have reviewed the data as listed Lab Results  Component Value Date   WBC 6.0 05/07/2020   HGB 12.9 05/07/2020   HCT 36.7 05/07/2020   MCV 105.8 (H) 05/07/2020   PLT 80 (L) 05/07/2020   Recent Labs    09/14/19 0813 09/28/19 1006 10/05/19 0807 10/15/19 1107 03/05/20 1307 04/16/20 0813 04/25/20 0815 05/07/20 0808  NA 139 138 140   < > 139 138 135 136  K 4.0 4.0 4.3   < > 3.7 4.1 4.6 5.3*  CL 104  104 104   < > 106 107 106 108  CO2 '24 26 27   ' < > 25 22 20* 19*  GLUCOSE 104* 106* 108*   < > 117* 98 97 101*  BUN '12 14 13   ' < > 13 25* 24* 35*  CREATININE 0.93 0.73 0.86   < > 0.95 0.90 0.86 1.30*  CALCIUM 9.2 9.0 9.9   < > 9.4 10.2 9.9 10.6*  GFRNONAA >60 >60 >60   < > >60 >60 >60 46*  GFRAA >60 >60 >60  --   --   --   --   --   PROT 7.5  --  7.6   < >  6.9 8.0  --  8.5*  ALBUMIN 3.5  --  4.0   < > 3.8 4.2  --  4.4  AST 30  --  37   < > 67* 23  --  27  ALT 21  --  30   < > 86* 21  --  30  ALKPHOS 71  --  72   < > 71 62  --  84  BILITOT 0.5  --  0.5   < > 0.6 0.6  --  0.6   < > = values in this interval not displayed.    RADIOGRAPHIC STUDIES: I have personally reviewed the radiological images as listed and agreed with the findings in the report. No results found.  ASSESSMENT & PLAN:   Endometrial cancer ([place]) # STAGE IV-High-grade serous carcinoma endometrial] pre-treatment-CEA 125 +1500; s/p 7cycles of Carbo-Taxol; s/p debulking surgery with bowel resections.  Ca-125 rising; monitor closely. Will plan CT if getting worse.   # proceed with cycle # 9 CarboTaxol labs today reviewed; platelets 80 [carboplatin AUC 5; Taxol decrease dose to 80%]; monitor closely.  Adjust the dose of carboplatin.  #Creatinine 1.3/hyperkalemia-potassium 5.3; recommend increase hydration; holding Cozaar.  Blood pressures ~120s 130s.  # Nausea-underlying disease versus GI causes/chemo; add prilosec BID; Compazine/Zofran.  # fatiue- depression-stable on prozac to 40 mg/day   # DISPOSITION: #  Chemo today; # labs- in 10 days; possible IVFs over 1 hour # Follow up in 3 weeks- MD; labs- cbc/cmp/ca-125; carbo-taxol- Dr.B    All questions were answered. The patient knows to call the clinic with any problems, questions or concerns.   Cammie Sickle, MD 05/07/2020 1:04 PM

## 2020-05-08 ENCOUNTER — Telehealth: Payer: Self-pay | Admitting: Internal Medicine

## 2020-05-08 LAB — CA 125: Cancer Antigen (CA) 125: 59 U/mL — ABNORMAL HIGH (ref 0.0–38.1)

## 2020-05-08 NOTE — Telephone Encounter (Signed)
On 5/4-I had a long discussion with the patient's daughter, Davy Pique- re: overall grim prognosis-especially given bulky disease post chemo/with rising Ca1 2 5.  Ca1 2 5 from yesterday pending.  If continued elevation noted recommend CT scan for further evaluation.  Also discussed-renal function was slightly abnormal creatinine 1.3/potassium 5.3; recommend oral hydration/stopping Cozaar.  Checking blood pressure daily.  Call us if elevated > 154 systolic-for alternative blood pressure medication.   Recommend follow-up labs /IV fluids in 10 days as planned.   Lauren-FYI-  GB

## 2020-05-19 ENCOUNTER — Inpatient Hospital Stay: Payer: Medicare Other

## 2020-05-19 ENCOUNTER — Telehealth: Payer: Self-pay | Admitting: *Deleted

## 2020-05-19 ENCOUNTER — Other Ambulatory Visit: Payer: Self-pay | Admitting: *Deleted

## 2020-05-19 DIAGNOSIS — Z5111 Encounter for antineoplastic chemotherapy: Secondary | ICD-10-CM | POA: Diagnosis not present

## 2020-05-19 DIAGNOSIS — C541 Malignant neoplasm of endometrium: Secondary | ICD-10-CM

## 2020-05-19 DIAGNOSIS — D696 Thrombocytopenia, unspecified: Secondary | ICD-10-CM

## 2020-05-19 DIAGNOSIS — D701 Agranulocytosis secondary to cancer chemotherapy: Secondary | ICD-10-CM

## 2020-05-19 LAB — BASIC METABOLIC PANEL
Anion gap: 6 (ref 5–15)
BUN: 20 mg/dL (ref 8–23)
CO2: 23 mmol/L (ref 22–32)
Calcium: 9.3 mg/dL (ref 8.9–10.3)
Chloride: 109 mmol/L (ref 98–111)
Creatinine, Ser: 0.99 mg/dL (ref 0.44–1.00)
GFR, Estimated: >60 mL/min (ref >60)
Glucose, Bld: 100 mg/dL — ABNORMAL HIGH (ref 70–99)
Potassium: 4.2 mmol/L (ref 3.5–5.1)
Sodium: 138 mmol/L (ref 135–145)

## 2020-05-19 LAB — CBC WITH DIFFERENTIAL/PLATELET
Abs Immature Granulocytes: 0 10*3/uL (ref 0.00–0.07)
Basophils Absolute: 0 10*3/uL (ref 0.0–0.1)
Basophils Relative: 0 %
Eosinophils Absolute: 0 10*3/uL (ref 0.0–0.5)
Eosinophils Relative: 2 %
HCT: 28.7 % — ABNORMAL LOW (ref 36.0–46.0)
Hemoglobin: 10.1 g/dL — ABNORMAL LOW (ref 12.0–15.0)
Immature Granulocytes: 0 %
Lymphocytes Relative: 59 %
Lymphs Abs: 0.8 10*3/uL (ref 0.7–4.0)
MCH: 36.6 pg — ABNORMAL HIGH (ref 26.0–34.0)
MCHC: 35.2 g/dL (ref 30.0–36.0)
MCV: 104 fL — ABNORMAL HIGH (ref 80.0–100.0)
Monocytes Absolute: 0.1 10*3/uL (ref 0.1–1.0)
Monocytes Relative: 9 %
Neutro Abs: 0.4 10*3/uL — CL (ref 1.7–7.7)
Neutrophils Relative %: 30 %
Platelets: 37 10*3/uL — ABNORMAL LOW (ref 150–400)
RBC: 2.76 MIL/uL — ABNORMAL LOW (ref 3.87–5.11)
RDW: 12.6 % (ref 11.5–15.5)
WBC: 1.5 10*3/uL — ABNORMAL LOW (ref 4.0–10.5)
nRBC: 0 % (ref 0.0–0.2)

## 2020-05-19 MED ORDER — SODIUM CHLORIDE 0.9 % IV SOLN
Freq: Once | INTRAVENOUS | Status: AC
  Filled 2020-05-19: qty 250

## 2020-05-19 NOTE — Telephone Encounter (Signed)
Per Ander Purpura, NP - ok to schedule cbc on Friday 5/20- hold chair for possible blood plts. Pt given new apts.

## 2020-05-19 NOTE — Progress Notes (Signed)
Patient tolerated IV fluid infusion well today, no concerns voiced. Patient informed of lab results and educated on bleeding and neutropenic precautions. Patient aware of lab apt. Verbalizes understanding.  Discharged. Stable.

## 2020-05-19 NOTE — Telephone Encounter (Signed)
Jeanette Allen - please review labs on Friday and discuss results with either Lauren or Sonia Baller. Pt's cbc- neutropenia/low plts.

## 2020-05-19 NOTE — Telephone Encounter (Signed)
1314- critical value called to Nira Conn, RN from [employer] in cancer center lab. ANC 0.4, plt count has also dropped to 34. Read back process performed with lab tech.  Critical value relayed Dr. Rulon Abide, NP and Ander Purpura, NP. 1330-  Jenny/Lauren does patient need recheck labs prior to 5/25 apt with Dr. Rogue Bussing. Pt is asymptomatic.

## 2020-05-23 ENCOUNTER — Telehealth: Payer: Self-pay

## 2020-05-23 ENCOUNTER — Inpatient Hospital Stay: Payer: Medicare Other

## 2020-05-23 ENCOUNTER — Encounter: Payer: Self-pay | Admitting: Nurse Practitioner

## 2020-05-23 DIAGNOSIS — D701 Agranulocytosis secondary to cancer chemotherapy: Secondary | ICD-10-CM

## 2020-05-23 DIAGNOSIS — Z5111 Encounter for antineoplastic chemotherapy: Secondary | ICD-10-CM | POA: Diagnosis not present

## 2020-05-23 DIAGNOSIS — D696 Thrombocytopenia, unspecified: Secondary | ICD-10-CM

## 2020-05-23 DIAGNOSIS — T451X5A Adverse effect of antineoplastic and immunosuppressive drugs, initial encounter: Secondary | ICD-10-CM

## 2020-05-23 DIAGNOSIS — C541 Malignant neoplasm of endometrium: Secondary | ICD-10-CM

## 2020-05-23 LAB — CBC WITH DIFFERENTIAL/PLATELET
Abs Immature Granulocytes: 0 10*3/uL (ref 0.00–0.07)
Basophils Absolute: 0 10*3/uL (ref 0.0–0.1)
Basophils Relative: 1 %
Eosinophils Absolute: 0 10*3/uL (ref 0.0–0.5)
Eosinophils Relative: 1 %
HCT: 29.7 % — ABNORMAL LOW (ref 36.0–46.0)
Hemoglobin: 10.6 g/dL — ABNORMAL LOW (ref 12.0–15.0)
Immature Granulocytes: 0 %
Lymphocytes Relative: 56 %
Lymphs Abs: 0.7 10*3/uL (ref 0.7–4.0)
MCH: 36.9 pg — ABNORMAL HIGH (ref 26.0–34.0)
MCHC: 35.7 g/dL (ref 30.0–36.0)
MCV: 103.5 fL — ABNORMAL HIGH (ref 80.0–100.0)
Monocytes Absolute: 0.2 10*3/uL (ref 0.1–1.0)
Monocytes Relative: 14 %
Neutro Abs: 0.3 10*3/uL — CL (ref 1.7–7.7)
Neutrophils Relative %: 28 %
Platelets: 51 10*3/uL — ABNORMAL LOW (ref 150–400)
RBC: 2.87 MIL/uL — ABNORMAL LOW (ref 3.87–5.11)
RDW: 13.1 % (ref 11.5–15.5)
Smear Review: NORMAL
WBC: 1.2 10*3/uL — CL (ref 4.0–10.5)
nRBC: 0 % (ref 0.0–0.2)

## 2020-05-23 LAB — SAMPLE TO BLOOD BANK

## 2020-05-23 NOTE — Telephone Encounter (Signed)
Patient had critcal wbc of 1.2 and ANC of 0.3. Per Lauren called to check on patient to make sure she was not having any current symptoms of concern. Patient does not have a fever, chills, or any other new sxs. Overall feels normal. The only thing she noted that was new is at night she has had a headache and backache which tylenol handles. I let her know I will forward that to NP. I expressed importance of wearing a mask in public places if needing to go out but to try to avoid large crowds, sick people, and to practice good hand hygiene as she is neutropenic and at high risk for infection. I advised if she starts to develop any new concerning sxs (fever,vomiting,diarrhea,URI sxs etc) before her infusion appointment on 5/25 to call us and let us know ASAP. Otherwise the plan will be to give her a neulasta injection when she comes for chemo tx on Wed. Pt is aware and expressed understanding.

## 2020-05-23 NOTE — Progress Notes (Signed)
Reviewed possibility of adding neulasta to next chemotherapy. No authorization required per Jeanette Allen.

## 2020-05-28 ENCOUNTER — Inpatient Hospital Stay (HOSPITAL_BASED_OUTPATIENT_CLINIC_OR_DEPARTMENT_OTHER): Payer: Medicare Other | Admitting: Internal Medicine

## 2020-05-28 ENCOUNTER — Encounter: Payer: Self-pay | Admitting: Internal Medicine

## 2020-05-28 ENCOUNTER — Inpatient Hospital Stay: Payer: Medicare Other

## 2020-05-28 DIAGNOSIS — C541 Malignant neoplasm of endometrium: Secondary | ICD-10-CM

## 2020-05-28 DIAGNOSIS — Z5111 Encounter for antineoplastic chemotherapy: Secondary | ICD-10-CM | POA: Diagnosis not present

## 2020-05-28 LAB — CBC WITH DIFFERENTIAL/PLATELET
Abs Immature Granulocytes: 0.01 10*3/uL (ref 0.00–0.07)
Basophils Absolute: 0 10*3/uL (ref 0.0–0.1)
Basophils Relative: 0 %
Eosinophils Absolute: 0 10*3/uL (ref 0.0–0.5)
Eosinophils Relative: 0 %
HCT: 29.1 % — ABNORMAL LOW (ref 36.0–46.0)
Hemoglobin: 10.4 g/dL — ABNORMAL LOW (ref 12.0–15.0)
Immature Granulocytes: 0 %
Lymphocytes Relative: 35 %
Lymphs Abs: 0.9 10*3/uL (ref 0.7–4.0)
MCH: 36.6 pg — ABNORMAL HIGH (ref 26.0–34.0)
MCHC: 35.7 g/dL (ref 30.0–36.0)
MCV: 102.5 fL — ABNORMAL HIGH (ref 80.0–100.0)
Monocytes Absolute: 0.3 10*3/uL (ref 0.1–1.0)
Monocytes Relative: 12 %
Neutro Abs: 1.4 10*3/uL — ABNORMAL LOW (ref 1.7–7.7)
Neutrophils Relative %: 53 %
Platelets: 66 10*3/uL — ABNORMAL LOW (ref 150–400)
RBC: 2.84 MIL/uL — ABNORMAL LOW (ref 3.87–5.11)
RDW: 13.8 % (ref 11.5–15.5)
WBC: 2.6 10*3/uL — ABNORMAL LOW (ref 4.0–10.5)
nRBC: 0 % (ref 0.0–0.2)

## 2020-05-28 LAB — COMPREHENSIVE METABOLIC PANEL
ALT: 57 U/L — ABNORMAL HIGH (ref 0–44)
AST: 37 U/L (ref 15–41)
Albumin: 3.8 g/dL (ref 3.5–5.0)
Alkaline Phosphatase: 94 U/L (ref 38–126)
Anion gap: 10 (ref 5–15)
BUN: 19 mg/dL (ref 8–23)
CO2: 20 mmol/L — ABNORMAL LOW (ref 22–32)
Calcium: 9.5 mg/dL (ref 8.9–10.3)
Chloride: 108 mmol/L (ref 98–111)
Creatinine, Ser: 0.85 mg/dL (ref 0.44–1.00)
GFR, Estimated: >60 mL/min (ref >60)
Glucose, Bld: 135 mg/dL — ABNORMAL HIGH (ref 70–99)
Potassium: 4 mmol/L (ref 3.5–5.1)
Sodium: 138 mmol/L (ref 135–145)
Total Bilirubin: 0.5 mg/dL (ref 0.3–1.2)
Total Protein: 7.5 g/dL (ref 6.5–8.1)

## 2020-05-28 NOTE — Progress Notes (Signed)
Patient here for pre treatment check no concerns today. °

## 2020-05-28 NOTE — Assessment & Plan Note (Addendum)
#   STAGE IV-High-grade serous carcinoma endometrial] pre-treatment-CEA 125 +1500; s/p 7cycles of Carbo-Taxol; s/p debulking surgery with bowel resections.    # HOLD #10  CarboTaxol labs today reviewed; platelets 66; ANC [carboplatin AUC 5; ANC 1.3 Taxol decrease dose to 80%]; monitor closely.  Adjust the dose of carboplatin. Will plan CT after cycle #10/order at next visit  # Neutropenia- ANC 1.3; HOLD chemo; plan G-CSF; discuss Claritin. Add udenyca D-2.   # Nausea-underlying disease versus GI causes/chemo; add prilosec BID; Compazine/Zofran. STABLE  # fatgiue- depression-STABLE.  on prozac to 40 mg/day   # DISPOSITION: #  HOLD Chemo today; # Follow up in 2 weeks- MD; labs- cbc/cmp/ca-125; carbo-taxol; D-2 Jeanette Allen- Dr.B

## 2020-05-28 NOTE — Progress Notes (Signed)
[facility] CONSULT NOTE  Patient Care Team: Danelle Berry, NP as PCP - General (Nurse Practitioner) Clent Jacks, RN as Oncology Nurse Navigator  CHIEF COMPLAINTS/PURPOSE OF CONSULTATION: high grade serous cancer   #  Oncology History Overview Note  #August 2021-ascitic fluid cytology-high-grade serous carcinoma of gynecologic origin; CT scan-behind the abdominal wall 4.6 x 2.1 mass ; within the mesentery 6.7 x 4.5 cm left of mid abdomen . BIOPSY of the peritoneal mass; high grade carcinoma ca-[phone] [Dr.Byrnett]; SEP 9th 2021- PET scan-shows peritoneal carcinomatosis; uterine uptake [Dr.Secord; unable to biopsy cervical stenosis] sigmoid colonic uptake concerning for malignancy.  No ovarian uptake.  CEA 125 +1300; CEA-2.    #Hepatitis C/ectopic pregnancy/PRBC transfusion 1995-untreated.[Dr.Vanga]  # 09/14/2019-neoadjuvant CARBO-TAXOL s/p #4 cycles-partial response; DEC 9th 2021-laparoscopy-surgery aborted given the need for multiple bowel resections; DEc 22nd, 2021-proceed with neoadjuvant carbotaxol; STARTING cycle #6- AUC-5 sec to persistent thrombocytopenia.  #Status post #7 CarboTaxol -March 2022-debulking surgery TAH/BSO; bowel resections; ileostomy/mucous fistula.  # April 13th-restart CarboTaxol No. 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 GENETICS- NEG; HRD-    # PALLIATIVE CARE EVALUATION:  # PAIN MANAGEMENT:    DIAGNOSIS: Uterine cancer  STAGE:   IV      ;  GOALS: control  CURRENT/MOST RECENT THERAPY : Carbotaxol    Uterine cancer ([place]) (Resolved)  09/04/2019 Initial Diagnosis   Gynecologic cancer [facility])   Endometrial cancer ([place])  09/14/2019 -  Chemotherapy    Patient is on Treatment Plan: OVARIAN CARBOPLATIN (AUC 6) / PACLITAXEL (175) Q21D X 6 CYCLES      10/05/2019 Initial Diagnosis   Endometrial cancer ([facility])     Genetic Testing   Negative germline genetic testing. No pathogenic variants identified on the [facility] panel. The report date is 10/01/2019. HRD testing was ordered but cancelled due to not enough sample/endometrial diagnosis.   The [facility] gene panel offered by [employer] includes sequencing and deletion/duplication testing of the following 35 genes: APC, ATM, AXIN2, BARD1, BMPR1A, BRCA1, BRCA2, BRIP1, CHD1, CDK4, CDKN2A, CHEK2, EPCAM (large rearrangement only), HOXB13, GALNT12, MLH1, MSH2, MSH3, MSH6, MUTYH, NBN, NTHL1, PALB2, PMS2, PTEN, RAD51C, RAD51D, RNF43, RPS20, SMAD4, STK11, and TP53. Sequencing was performed for select regions of POLE and POLD1, and large rearrangement analysis was performed for select regions of GREM1.    01/16/2020 Cancer Staging   Staging form: Corpus Uteri - Carcinoma and Carcinosarcoma, AJCC 8th Edition - Clinical: Stage IVB (cM1) - Signed by Cammie Sickle, MD on 01/16/2020      HISTORY OF PRESENTING ILLNESS:  Jeanette Allen 65 y.o.  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  Patient denies any worsening abdominal pain.  Any constipation.  Denies any worsening tingling or numbness.  Intermittent nausea no vomiting.  No fever no chills.  Review of Systems  Constitutional: Positive for malaise/fatigue. Negative for chills, diaphoresis, fever and weight loss.  HENT: Negative for nosebleeds and sore throat.   Eyes: Negative for double vision.  Respiratory: Negative for cough, hemoptysis, sputum production, shortness of breath and wheezing.   Cardiovascular: Negative for chest pain, palpitations, orthopnea and leg swelling.  Gastrointestinal: Positive for constipation. Negative for blood in stool, diarrhea, heartburn, melena and vomiting.  Genitourinary: Negative for dysuria, frequency and urgency.  Musculoskeletal: Positive for  myalgias. Negative for back pain and joint pain.  Skin: Negative.  Negative for itching and rash.  Neurological: Negative for dizziness, tingling, focal weakness, weakness and headaches.  Endo/Heme/Allergies: Does not bruise/bleed easily.  Psychiatric/Behavioral: Negative for depression. The patient is not nervous/anxious and does not have insomnia.      MEDICAL HISTORY:  Past Medical History:  Diagnosis Date  . Arthritis   . Cancer ([place])   . Hypertension     SURGICAL HISTORY: Past Surgical History:  Procedure Laterality Date  . APPENDECTOMY    . COLONOSCOPY WITH PROPOFOL N/A 10/01/2019   Procedure: COLONOSCOPY WITH PROPOFOL;  Surgeon: Lin Landsman, MD;  Location: [facility] ENDOSCOPY;  Service: Gastroenterology;  Laterality: N/A;  . ESOPHAGOGASTRODUODENOSCOPY (EGD) WITH PROPOFOL N/A 09/12/2019   Procedure: ESOPHAGOGASTRODUODENOSCOPY (EGD) WITH PROPOFOL;  Surgeon: Lin Landsman, MD;  Location: [facility] ENDOSCOPY;  Service: Gastroenterology;  Laterality: N/A;    SOCIAL HISTORY: Social History   Socioeconomic History  . Marital status: Married    Spouse name: Not on file  . Number of children: Not on file  . Years of education: Not on file  . Highest education level: Not on file  Occupational History  . Not on file  Tobacco Use  . Smoking status: Current Every Day Smoker  . Smokeless tobacco: Never Used  Vaping Use  . Vaping Use: Never used  Substance and Sexual Activity  . Alcohol use: Not Currently  . Drug use: Never  . Sexual activity: Yes  Other Topics Concern  . Not on file  Social History Narrative   Lives with husband; near [place]. Worked Network engineer at [employer]; smoke 1/2 ppd/slowed; no alcohol; 3 children [2 boys; one girl- [employer]]   Social Determinants of Health   Financial Resource Strain: Not on file  Food Insecurity: Not on file  Transportation Needs: Not on file  Physical Activity: Not on file  Stress: Not on file  Social Connections:  Not on file  Intimate Partner Violence: Not on file    FAMILY HISTORY: Family History  Problem Relation Age of Onset  . Diabetes Maternal Grandmother   . Hypertension Maternal Grandmother   . Stroke Maternal Grandmother     ALLERGIES:  has No Known Allergies.  MEDICATIONS:  Current Outpatient Medications  Medication Sig Dispense Refill  . acetaminophen (TYLENOL) 500 MG tablet Take by mouth.    . ergocalciferol (VITAMIN D2) 1.25 MG (50000 UT) capsule Take by mouth.    Marland Kitchen FLUoxetine (PROZAC) 40 MG capsule Take 1 capsule (40 mg total) by mouth daily. 30 capsule 3  . losartan (COZAAR) 50 MG tablet Take by mouth.    Marland Kitchen omeprazole (PRILOSEC) 40 MG capsule Take 1 capsule (40 mg total) by mouth 2 (two) times daily before a meal. 60 capsule 1  . ondansetron (ZOFRAN) 8 MG tablet One pill every 8 hours as needed for nausea/vomitting. 60 tablet 1  . ondansetron (ZOFRAN-ODT) 4 MG disintegrating tablet Take 4 mg by mouth every 8 (eight) hours as needed for nausea or vomiting.    Marland Kitchen oxyCODONE (OXY IR/ROXICODONE) 5 MG immediate release tablet Take by mouth.    . polyethylene glycol powder (GLYCOLAX/MIRALAX) 17 GM/SCOOP powder Take by mouth.    . prochlorperazine (COMPAZINE) 10 MG tablet Take 1 tablet (10 mg total) by mouth every 6 (six) hours as needed for nausea or vomiting. 40 tablet 1   No current facility-administered medications for this visit.      Marland Kitchen  PHYSICAL EXAMINATION: ECOG PERFORMANCE STATUS: 1 - Symptomatic but completely ambulatory  Vitals:  05/28/20 0820  BP: 117/79  Pulse: 95  Resp: 16  Temp: 98.3 F (36.8 C)   Filed Weights   05/28/20 0820  Weight: 148 lb (67.1 kg)    Physical Exam Constitutional:      Comments: Ambulating independently.  Accompanied by husband.  HENT:     Head: Normocephalic and atraumatic.     Mouth/Throat:     Pharynx: No oropharyngeal exudate.  Eyes:     Pupils: Pupils are equal, round, and reactive to light.  Cardiovascular:     Rate and  Rhythm: Normal rate and regular rhythm.  Pulmonary:     Effort: Pulmonary effort is normal. No respiratory distress.     Breath sounds: Normal breath sounds. No wheezing.  Abdominal:     General: Bowel sounds are normal. There is distension.     Palpations: Abdomen is soft. There is no mass.     Tenderness: There is no abdominal tenderness. There is no guarding or rebound.  Musculoskeletal:        General: No tenderness. Normal range of motion.     Cervical back: Normal range of motion and neck supple.  Skin:    General: Skin is warm.  Neurological:     Mental Status: She is alert and oriented to person, place, and time.  Psychiatric:        Mood and Affect: Affect normal.      LABORATORY DATA:  I have reviewed the data as listed Lab Results  Component Value Date   WBC 2.6 (L) 05/28/2020   HGB 10.4 (L) 05/28/2020   HCT 29.1 (L) 05/28/2020   MCV 102.5 (H) 05/28/2020   PLT 66 (L) 05/28/2020   Recent Labs    09/14/19 0813 09/28/19 1006 10/05/19 0807 10/15/19 1107 04/16/20 0813 04/25/20 0815 05/07/20 0808 05/19/20 1247 05/28/20 0800  NA 139 138 140   < > 138   < > 136 138 138  K 4.0 4.0 4.3   < > 4.1   < > 5.3* 4.2 4.0  CL 104 104 104   < > 107   < > 108 109 108  CO2 _0 < > 22   < > 19* 23 20*  GLUCOSE 104* 106* 108*   < > 98   < > 101* 100* 135*  BUN _1 < > 25*   < > 35* 20 19  CREATININE 0.93 0.73 0.86   < > 0.90   < > 1.30* 0.99 0.85  CALCIUM 9.2 9.0 9.9   < > 10.2   < > 10.6* 9.3 9.5  GFRNONAA >60 >60 >60   < > >60   < > 46* >60 >60  GFRAA >60 >60 >60  --   --   --   --   --   --   PROT 7.5  --  7.6   < > 8.0  --  8.5*  --  7.5  ALBUMIN 3.5  --  4.0   < > 4.2  --  4.4  --  3.8  AST 30  --  37   < > 23  --  27  --  37  ALT 21  --  30   < > 21  --  30  --  57*  ALKPHOS 71  --  72   < > 62  --  84  --  94  BILITOT 0.5  --  0.5   < >  0.6  --  0.6  --  0.5   < > = values in this interval not displayed.    RADIOGRAPHIC STUDIES: I have personally  reviewed the radiological images as listed and agreed with the findings in the report. No results found.  ASSESSMENT & PLAN:   Endometrial cancer ([place]) # STAGE IV-High-grade serous carcinoma endometrial] pre-treatment-CEA 125 +1500; s/p 7cycles of Carbo-Taxol; s/p debulking surgery with bowel resections.    # HOLD #10  CarboTaxol labs today reviewed; platelets 66; ANC [carboplatin AUC 5; ANC 1.3 Taxol decrease dose to 80%]; monitor closely.  Adjust the dose of carboplatin. Will plan CT after cycle #10/order at next visit  # Neutropenia- ANC 1.3; HOLD chemo; plan G-CSF; discuss Claritin. Add udenyca D-2.   # Nausea-underlying disease versus GI causes/chemo; add prilosec BID; Compazine/Zofran. STABLE  # fatgiue- depression-STABLE.  on prozac to 40 mg/day   # DISPOSITION: #  HOLD Chemo today; # Follow up in 2 weeks- MD; labs- cbc/cmp/ca-125; carbo-taxol; D-2 Ellen Henri- Dr.B    All questions were answered. The patient knows to call the clinic with any problems, questions or concerns.   Cammie Sickle, MD 05/28/2020 1:09 PM

## 2020-05-29 LAB — CA 125: Cancer Antigen (CA) 125: 43.3 U/mL — ABNORMAL HIGH (ref 0.0–38.1)

## 2020-06-11 ENCOUNTER — Encounter: Payer: Self-pay | Admitting: Internal Medicine

## 2020-06-11 ENCOUNTER — Inpatient Hospital Stay: Payer: Medicare Other

## 2020-06-11 ENCOUNTER — Inpatient Hospital Stay (HOSPITAL_BASED_OUTPATIENT_CLINIC_OR_DEPARTMENT_OTHER): Payer: Medicare Other | Admitting: Internal Medicine

## 2020-06-11 ENCOUNTER — Inpatient Hospital Stay: Payer: Medicare Other | Attending: Obstetrics and Gynecology

## 2020-06-11 VITALS — BP 135/72 | HR 69 | Temp 97.7°F | Resp 16 | Wt 146.8 lb

## 2020-06-11 DIAGNOSIS — Z5189 Encounter for other specified aftercare: Secondary | ICD-10-CM | POA: Diagnosis not present

## 2020-06-11 DIAGNOSIS — Z823 Family history of stroke: Secondary | ICD-10-CM | POA: Insufficient documentation

## 2020-06-11 DIAGNOSIS — F17210 Nicotine dependence, cigarettes, uncomplicated: Secondary | ICD-10-CM | POA: Insufficient documentation

## 2020-06-11 DIAGNOSIS — M791 Myalgia, unspecified site: Secondary | ICD-10-CM | POA: Diagnosis not present

## 2020-06-11 DIAGNOSIS — R5383 Other fatigue: Secondary | ICD-10-CM | POA: Diagnosis not present

## 2020-06-11 DIAGNOSIS — Z833 Family history of diabetes mellitus: Secondary | ICD-10-CM | POA: Insufficient documentation

## 2020-06-11 DIAGNOSIS — Z5111 Encounter for antineoplastic chemotherapy: Secondary | ICD-10-CM | POA: Insufficient documentation

## 2020-06-11 DIAGNOSIS — C541 Malignant neoplasm of endometrium: Secondary | ICD-10-CM

## 2020-06-11 DIAGNOSIS — Z7189 Other specified counseling: Secondary | ICD-10-CM

## 2020-06-11 DIAGNOSIS — K5900 Constipation, unspecified: Secondary | ICD-10-CM | POA: Diagnosis not present

## 2020-06-11 DIAGNOSIS — Z8249 Family history of ischemic heart disease and other diseases of the circulatory system: Secondary | ICD-10-CM | POA: Diagnosis not present

## 2020-06-11 DIAGNOSIS — Z79899 Other long term (current) drug therapy: Secondary | ICD-10-CM | POA: Diagnosis not present

## 2020-06-11 LAB — CBC WITH DIFFERENTIAL/PLATELET
Abs Immature Granulocytes: 0.02 10*3/uL (ref 0.00–0.07)
Basophils Absolute: 0 10*3/uL (ref 0.0–0.1)
Basophils Relative: 1 %
Eosinophils Absolute: 0.1 10*3/uL (ref 0.0–0.5)
Eosinophils Relative: 3 %
HCT: 33.3 % — ABNORMAL LOW (ref 36.0–46.0)
Hemoglobin: 11.6 g/dL — ABNORMAL LOW (ref 12.0–15.0)
Immature Granulocytes: 1 %
Lymphocytes Relative: 31 %
Lymphs Abs: 1 10*3/uL (ref 0.7–4.0)
MCH: 36.6 pg — ABNORMAL HIGH (ref 26.0–34.0)
MCHC: 34.8 g/dL (ref 30.0–36.0)
MCV: 105 fL — ABNORMAL HIGH (ref 80.0–100.0)
Monocytes Absolute: 0.5 10*3/uL (ref 0.1–1.0)
Monocytes Relative: 15 %
Neutro Abs: 1.6 10*3/uL — ABNORMAL LOW (ref 1.7–7.7)
Neutrophils Relative %: 49 %
Platelets: 189 10*3/uL (ref 150–400)
RBC: 3.17 MIL/uL — ABNORMAL LOW (ref 3.87–5.11)
RDW: 15 % (ref 11.5–15.5)
WBC: 3.2 10*3/uL — ABNORMAL LOW (ref 4.0–10.5)
nRBC: 0 % (ref 0.0–0.2)

## 2020-06-11 LAB — COMPREHENSIVE METABOLIC PANEL
ALT: 83 U/L — ABNORMAL HIGH (ref 0–44)
AST: 53 U/L — ABNORMAL HIGH (ref 15–41)
Albumin: 4.1 g/dL (ref 3.5–5.0)
Alkaline Phosphatase: 91 U/L (ref 38–126)
Anion gap: 8 (ref 5–15)
BUN: 18 mg/dL (ref 8–23)
CO2: 22 mmol/L (ref 22–32)
Calcium: 9.8 mg/dL (ref 8.9–10.3)
Chloride: 107 mmol/L (ref 98–111)
Creatinine, Ser: 0.98 mg/dL (ref 0.44–1.00)
GFR, Estimated: >60 mL/min (ref >60)
Glucose, Bld: 103 mg/dL — ABNORMAL HIGH (ref 70–99)
Potassium: 4.5 mmol/L (ref 3.5–5.1)
Sodium: 137 mmol/L (ref 135–145)
Total Bilirubin: 0.3 mg/dL (ref 0.3–1.2)
Total Protein: 7.8 g/dL (ref 6.5–8.1)

## 2020-06-11 MED ORDER — CARBOPLATIN CHEMO INJECTION 600 MG/60ML
415.5000 mg | Freq: Once | INTRAVENOUS | Status: AC
  Administered 2020-06-11: 420 mg via INTRAVENOUS
  Filled 2020-06-11: qty 42

## 2020-06-11 MED ORDER — SODIUM CHLORIDE 0.9 % IV SOLN
Freq: Once | INTRAVENOUS | Status: AC
Start: 2020-06-11 — End: 2020-06-11
  Filled 2020-06-11: qty 250

## 2020-06-11 MED ORDER — PALONOSETRON HCL INJECTION 0.25 MG/5ML
0.2500 mg | Freq: Once | INTRAVENOUS | Status: AC
  Administered 2020-06-11: 0.25 mg via INTRAVENOUS
  Filled 2020-06-11: qty 5

## 2020-06-11 MED ORDER — LORAZEPAM 0.5 MG PO TABS
0.5000 mg | ORAL_TABLET | Freq: Once | ORAL | Status: AC
  Administered 2020-06-11: 0.5 mg via ORAL
  Filled 2020-06-11: qty 1

## 2020-06-11 MED ORDER — SODIUM CHLORIDE 0.9 % IV SOLN
150.0000 mg | Freq: Once | INTRAVENOUS | Status: AC
  Administered 2020-06-11: 150 mg via INTRAVENOUS
  Filled 2020-06-11: qty 150

## 2020-06-11 MED ORDER — DIPHENHYDRAMINE HCL 50 MG/ML IJ SOLN
50.0000 mg | Freq: Once | INTRAMUSCULAR | Status: AC
  Administered 2020-06-11: 50 mg via INTRAVENOUS
  Filled 2020-06-11: qty 1

## 2020-06-11 MED ORDER — FAMOTIDINE 20 MG IN NS 100 ML IVPB
20.0000 mg | Freq: Once | INTRAVENOUS | Status: AC
  Administered 2020-06-11: 20 mg via INTRAVENOUS
  Filled 2020-06-11: qty 100
  Filled 2020-06-11: qty 20

## 2020-06-11 MED ORDER — SODIUM CHLORIDE 0.9 % IV SOLN
140.0000 mg/m2 | Freq: Once | INTRAVENOUS | Status: AC
  Administered 2020-06-11: 258 mg via INTRAVENOUS
  Filled 2020-06-11: qty 43

## 2020-06-11 MED ORDER — SODIUM CHLORIDE 0.9 % IV SOLN
10.0000 mg | Freq: Once | INTRAVENOUS | Status: AC
  Administered 2020-06-11: 10 mg via INTRAVENOUS
  Filled 2020-06-11: qty 10

## 2020-06-11 NOTE — Patient Instructions (Signed)
[facility] CENTER [place] [facility]  Discharge Instructions: Thank you for choosing [facility] to provide your oncology and hematology care.  If you have a lab appointment with the Cancer Center, please go directly to the Cancer Center and check in at the registration area.  Wear comfortable clothing and clothing appropriate for easy access to any Portacath or PICC line.   We strive to give you quality time with your provider. You may need to reschedule your appointment if you arrive late (15 or more minutes).  Arriving late affects you and other patients whose appointments are after yours.  Also, if you miss three or more appointments without notifying the office, you may be dismissed from the clinic at the provider's discretion.      For prescription refill requests, have your pharmacy contact our office and allow 72 hours for refills to be completed.    Today you received the following chemotherapy and/or immunotherapy agents       To help prevent nausea and vomiting after your treatment, we encourage you to take your nausea medication as directed.  BELOW ARE SYMPTOMS THAT SHOULD BE REPORTED IMMEDIATELY: *FEVER GREATER THAN 100.4 F (38 C) OR HIGHER *CHILLS OR SWEATING *NAUSEA AND VOMITING THAT IS NOT CONTROLLED WITH YOUR NAUSEA MEDICATION *UNUSUAL SHORTNESS OF BREATH *UNUSUAL BRUISING OR BLEEDING *URINARY PROBLEMS (pain or burning when urinating, or frequent urination) *BOWEL PROBLEMS (unusual diarrhea, constipation, pain near the anus) TENDERNESS IN MOUTH AND THROAT WITH OR WITHOUT PRESENCE OF ULCERS (sore throat, sores in mouth, or a toothache) UNUSUAL RASH, SWELLING OR PAIN  UNUSUAL VAGINAL DISCHARGE OR ITCHING   Items with * indicate a potential emergency and should be followed up as soon as possible or go to the Emergency Department if any problems should occur.  Please show the CHEMOTHERAPY ALERT CARD or IMMUNOTHERAPY ALERT CARD at check-in to the  Emergency Department and triage nurse.  Should you have questions after your visit or need to cancel or reschedule your appointment, please contact [facility] C[facility] [place] [facility]  [phone] and follow the prompts.  Office hours are 8:00 a.m. to 4:30 p.m. Monday - Friday. Please note that voicemails left after 4:00 p.m. may not be returned until the following business day.  We are closed weekends and major holidays. You have access to a nurse at all times for urgent questions. Please call the main number to the clinic [phone] and follow the prompts.  For any non-urgent questions, you may also contact your provider using MyChart. We now offer e-Visits for anyone 18 and older to request care online for non-urgent symptoms. For details visit [email or web address].   Also download the MyChart app! Go to the app store, search "MyChart", open the app, select [facility], and log in with your MyChart username and password.  Due to Covid, a mask is required upon entering the hospital/clinic. If you do not have a mask, one will be given to you upon arrival. For doctor visits, patients may have 1 support person aged 18 or older with them. For treatment visits, patients cannot have anyone with them due to current Covid guidelines and our immunocompromised population.  

## 2020-06-11 NOTE — Assessment & Plan Note (Addendum)
#   STAGE IV-High-grade serous carcinoma endometrial] pre-treatment-CEA 125 +1500; s/p 7cycles of Carbo-Taxol; s/p debulking surgery with bowel resections.    # proceed with #10  CarboTaxol. Labs today reviewed;  acceptable for treatment today. [carboplatin AUC 5; ANC 1.3 Taxol decrease dose to 80%];  Will plan CT after cycle #10/ordered scan today.  # Nausea-underlying disease versus GI causes/chemo; add prilosec BID; Compazine/Zofran.  Stable.  # fatgiue- depression-stable on prozac to 40 mg/day   #Myalgias from Taxol-recommend prednisone 10 mg for 5 days post chemotherapy.  # DISPOSITION: #  Chemo today; # Follow up in 4 weeks- MD; labs- cbc/cmp/ca-125; No chemo; - Dr.B

## 2020-06-11 NOTE — Progress Notes (Signed)
[facility] CONSULT NOTE  Patient Care Team: Danelle Berry, NP as PCP - General (Nurse Practitioner) Clent Jacks, RN as Oncology Nurse Navigator  CHIEF COMPLAINTS/PURPOSE OF CONSULTATION: high grade serous cancer   #  Oncology History Overview Note  #August 2021-ascitic fluid cytology-high-grade serous carcinoma of gynecologic origin; CT scan-behind the abdominal wall 4.6 x 2.1 mass ; within the mesentery 6.7 x 4.5 cm left of mid abdomen . BIOPSY of the peritoneal mass; high grade carcinoma [phone] [Dr.Byrnett]; SEP 9th 2021- PET scan-shows peritoneal carcinomatosis; uterine uptake [Dr.Secord; unable to biopsy cervical stenosis] sigmoid colonic uptake concerning for malignancy.  No ovarian uptake.  CEA 125 +1300; CEA-2.    #Hepatitis C/ectopic pregnancy/PRBC transfusion 1995-untreated.[Dr.Vanga]  # 09/14/2019-neoadjuvant CARBO-TAXOL s/p #4 cycles-partial response; DEC 9th 2021-laparoscopy-surgery aborted given the need for multiple bowel resections; DEc 22nd, 2021-proceed with neoadjuvant carbotaxol; STARTING cycle #6- AUC-5 sec to persistent thrombocytopenia.  #Status post #7 CarboTaxol -March 2022-debulking surgery TAH/BSO; bowel resections; ileostomy/mucous fistula.  # April 13th-restart CarboTaxol No. 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 GENETICS- NEG; HRD-    # PALLIATIVE CARE EVALUATION:  # PAIN MANAGEMENT:    DIAGNOSIS: Uterine cancer  STAGE:   IV      ;  GOALS: control  CURRENT/MOST RECENT THERAPY : Carbotaxol    Uterine cancer ([place]) (Resolved)  09/04/2019 Initial Diagnosis   Gynecologic cancer [facility])   Endometrial cancer ([place])  09/14/2019 -  Chemotherapy    Patient is on Treatment Plan: OVARIAN CARBOPLATIN (AUC 6) / PACLITAXEL (175) Q21D X 6 CYCLES      10/05/2019 Initial Diagnosis   Endometrial cancer ([facility])     Genetic Testing   Negative germline genetic testing. No pathogenic variants identified on the [facility] panel. The report date is 10/01/2019. HRD testing was ordered but cancelled due to not enough sample/endometrial diagnosis.   The [facility] gene panel offered by [employer] includes sequencing and deletion/duplication testing of the following 35 genes: APC, ATM, AXIN2, BARD1, BMPR1A, BRCA1, BRCA2, BRIP1, CHD1, CDK4, CDKN2A, CHEK2, EPCAM (large rearrangement only), HOXB13, GALNT12, MLH1, MSH2, MSH3, MSH6, MUTYH, NBN, NTHL1, PALB2, PMS2, PTEN, RAD51C, RAD51D, RNF43, RPS20, SMAD4, STK11, and TP53. Sequencing was performed for select regions of POLE and POLD1, and large rearrangement analysis was performed for select regions of GREM1.    01/16/2020 Cancer Staging   Staging form: Corpus Uteri - Carcinoma and Carcinosarcoma, AJCC 8th Edition - Clinical: Stage IVB (cM1) - Signed by Cammie Sickle, MD on 01/16/2020      HISTORY OF PRESENTING ILLNESS:  Jeanette [facility] 65 y.o.  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  Patient's chemotherapy was held 2 weeks ago because of low blood counts/thrombocytopenia.   Patient complains of myalgias postchemotherapy.  Otherwise no fever no chills.  Mild to moderate nausea no vomiting.   Review of Systems  Constitutional: Positive for malaise/fatigue. Negative for chills, diaphoresis, fever and weight loss.  HENT: Negative for nosebleeds and sore throat.   Eyes: Negative for double vision.  Respiratory: Negative for cough, hemoptysis, sputum production, shortness of breath and wheezing.   Cardiovascular: Negative for chest pain, palpitations, orthopnea and leg swelling.  Gastrointestinal: Positive for constipation. Negative for blood in stool, diarrhea, heartburn, melena and vomiting.  Genitourinary: Negative for dysuria, frequency  and urgency.  Musculoskeletal: Positive for myalgias.  Negative for back pain and joint pain.  Skin: Negative.  Negative for itching and rash.  Neurological: Negative for dizziness, tingling, focal weakness, weakness and headaches.  Endo/Heme/Allergies: Does not bruise/bleed easily.  Psychiatric/Behavioral: Negative for depression. The patient is not nervous/anxious and does not have insomnia.      MEDICAL HISTORY:  Past Medical History:  Diagnosis Date  . Arthritis   . Cancer ([place])   . Hypertension     SURGICAL HISTORY: Past Surgical History:  Procedure Laterality Date  . APPENDECTOMY    . COLONOSCOPY WITH PROPOFOL N/A 10/01/2019   Procedure: COLONOSCOPY WITH PROPOFOL;  Surgeon: Lin Landsman, MD;  Location: [facility] ENDOSCOPY;  Service: Gastroenterology;  Laterality: N/A;  . ESOPHAGOGASTRODUODENOSCOPY (EGD) WITH PROPOFOL N/A 09/12/2019   Procedure: ESOPHAGOGASTRODUODENOSCOPY (EGD) WITH PROPOFOL;  Surgeon: Lin Landsman, MD;  Location: [facility] ENDOSCOPY;  Service: Gastroenterology;  Laterality: N/A;    SOCIAL HISTORY: Social History   Socioeconomic History  . Marital status: Married    Spouse name: Not on file  . Number of children: Not on file  . Years of education: Not on file  . Highest education level: Not on file  Occupational History  . Not on file  Tobacco Use  . Smoking status: Current Every Day Smoker  . Smokeless tobacco: Never Used  Vaping Use  . Vaping Use: Never used  Substance and Sexual Activity  . Alcohol use: Not Currently  . Drug use: Never  . Sexual activity: Yes  Other Topics Concern  . Not on file  Social History Narrative   Lives with husband; near [place]. Worked Network engineer at [employer]; smoke 1/2 ppd/slowed; no alcohol; 3 children [2 boys; one girl- [employer]]   Social Determinants of Health   Financial Resource Strain: Not on file  Food Insecurity: Not on file  Transportation Needs: Not on file  Physical Activity: Not on file   Stress: Not on file  Social Connections: Not on file  Intimate Partner Violence: Not on file    FAMILY HISTORY: Family History  Problem Relation Age of Onset  . Diabetes Maternal Grandmother   . Hypertension Maternal Grandmother   . Stroke Maternal Grandmother     ALLERGIES:  has No Known Allergies.  MEDICATIONS:  Current Outpatient Medications  Medication Sig Dispense Refill  . acetaminophen (TYLENOL) 500 MG tablet Take by mouth.    . ergocalciferol (VITAMIN D2) 1.25 MG (50000 UT) capsule Take by mouth.    Marland Kitchen FLUoxetine (PROZAC) 40 MG capsule Take 1 capsule (40 mg total) by mouth daily. 30 capsule 3  . omeprazole (PRILOSEC) 40 MG capsule Take 1 capsule (40 mg total) by mouth 2 (two) times daily before a meal. 60 capsule 1  . ondansetron (ZOFRAN) 8 MG tablet One pill every 8 hours as needed for nausea/vomitting. 60 tablet 1  . prochlorperazine (COMPAZINE) 10 MG tablet Take 1 tablet (10 mg total) by mouth every 6 (six) hours as needed for nausea or vomiting. 40 tablet 1  . losartan (COZAAR) 50 MG tablet Take by mouth. (Patient not taking: Reported on 06/11/2020)    . ondansetron (ZOFRAN-ODT) 4 MG disintegrating tablet Take 4 mg by mouth every 8 (eight) hours as needed for nausea or vomiting. (Patient not taking: Reported on 06/11/2020)    . oxyCODONE (OXY IR/ROXICODONE) 5 MG immediate release tablet Take by mouth. (Patient not taking: Reported on 06/11/2020)    . polyethylene glycol powder (GLYCOLAX/MIRALAX) 17 GM/SCOOP powder Take by mouth. (Patient not taking: Reported on  06/11/2020)     No current facility-administered medications for this visit.      Marland Kitchen  PHYSICAL EXAMINATION: ECOG PERFORMANCE STATUS: 1 - Symptomatic but completely ambulatory  Vitals:   06/11/20 0841  BP: 135/72  Pulse: 69  Resp: 16  Temp: 97.7 F (36.5 C)  SpO2: 100%   Filed Weights   06/11/20 0841  Weight: 146 lb 12.8 oz (66.6 kg)    Physical Exam Constitutional:      Comments: Ambulating  independently.  Accompanied by husband.  HENT:     Head: Normocephalic and atraumatic.     Mouth/Throat:     Pharynx: No oropharyngeal exudate.  Eyes:     Pupils: Pupils are equal, round, and reactive to light.  Cardiovascular:     Rate and Rhythm: Normal rate and regular rhythm.  Pulmonary:     Effort: Pulmonary effort is normal. No respiratory distress.     Breath sounds: Normal breath sounds. No wheezing.  Abdominal:     General: Bowel sounds are normal. There is distension.     Palpations: Abdomen is soft. There is no mass.     Tenderness: There is no abdominal tenderness. There is no guarding or rebound.  Musculoskeletal:        General: No tenderness. Normal range of motion.     Cervical back: Normal range of motion and neck supple.  Skin:    General: Skin is warm.  Neurological:     Mental Status: She is alert and oriented to person, place, and time.  Psychiatric:        Mood and Affect: Affect normal.      LABORATORY DATA:  I have reviewed the data as listed Lab Results  Component Value Date   WBC 3.2 (L) 06/11/2020   HGB 11.6 (L) 06/11/2020   HCT 33.3 (L) 06/11/2020   MCV 105.0 (H) 06/11/2020   PLT 189 06/11/2020   Recent Labs    09/14/19 0813 09/28/19 1006 10/05/19 0807 10/15/19 1107 05/07/20 0808 05/19/20 1247 05/28/20 0800 06/11/20 0824  NA 139 138 140   < > 136 138 138 137  K 4.0 4.0 4.3   < > 5.3* 4.2 4.0 4.5  CL 104 104 104   < > 108 109 108 107  CO2 _0 < > 19* 23 20* 22  GLUCOSE 104* 106* 108*   < > 101* 100* 135* 103*  BUN _1 < > 35* _2 CREATININE 0.93 0.73 0.86   < > 1.30* 0.99 0.85 0.98  CALCIUM 9.2 9.0 9.9   < > 10.6* 9.3 9.5 9.8  GFRNONAA >60 >60 >60   < > 46* >60 >60 >60  GFRAA >60 >60 >60  --   --   --   --   --   PROT 7.5  --  7.6   < > 8.5*  --  7.5 7.8  ALBUMIN 3.5  --  4.0   < > 4.4  --  3.8 4.1  AST 30  --  37   < > 27  --  37 53*  ALT 21  --  30   < > 30  --  57* 83*  ALKPHOS 71  --  72   < > 84  --  94  91  BILITOT 0.5  --  0.5   < > 0.6  --  0.5 0.3   < > = values in this interval  not displayed.    RADIOGRAPHIC STUDIES: I have personally reviewed the radiological images as listed and agreed with the findings in the report. No results found.  ASSESSMENT & PLAN:   Endometrial cancer ([place]) # STAGE IV-High-grade serous carcinoma endometrial] pre-treatment-CEA 125 +1500; s/p 7cycles of Carbo-Taxol; s/p debulking surgery with bowel resections.    # proceed with #10  CarboTaxol. Labs today reviewed;  acceptable for treatment today. [carboplatin AUC 5; ANC 1.3 Taxol decrease dose to 80%];  Will plan CT after cycle #10/ordered scan today.  # Nausea-underlying disease versus GI causes/chemo; add prilosec BID; Compazine/Zofran.  Stable.  # fatgiue- depression-stable on prozac to 40 mg/day   #Myalgias from Taxol-recommend prednisone 10 mg for 5 days post chemotherapy.  # DISPOSITION: #  Chemo today; # Follow up in 4 weeks- MD; labs- cbc/cmp/ca-125; No chemo; - Dr.B    All questions were answered. The patient knows to call the clinic with any problems, questions or concerns.   Cammie Sickle, MD 06/11/2020 8:44 PM

## 2020-06-12 ENCOUNTER — Inpatient Hospital Stay: Payer: Medicare Other

## 2020-06-12 DIAGNOSIS — Z5111 Encounter for antineoplastic chemotherapy: Secondary | ICD-10-CM | POA: Diagnosis not present

## 2020-06-12 DIAGNOSIS — Z7189 Other specified counseling: Secondary | ICD-10-CM

## 2020-06-12 DIAGNOSIS — C541 Malignant neoplasm of endometrium: Secondary | ICD-10-CM

## 2020-06-12 LAB — CA 125: Cancer Antigen (CA) 125: 42.5 U/mL — ABNORMAL HIGH (ref 0.0–38.1)

## 2020-06-12 MED ORDER — PEGFILGRASTIM-CBQV 6 MG/0.6ML ~~LOC~~ SOSY
6.0000 mg | PREFILLED_SYRINGE | Freq: Once | SUBCUTANEOUS | Status: AC
  Administered 2020-06-12: 6 mg via SUBCUTANEOUS
  Filled 2020-06-12: qty 0.6

## 2020-06-24 ENCOUNTER — Telehealth: Payer: Self-pay | Admitting: *Deleted

## 2020-06-24 ENCOUNTER — Other Ambulatory Visit: Payer: Self-pay | Admitting: Internal Medicine

## 2020-06-24 DIAGNOSIS — C541 Malignant neoplasm of endometrium: Secondary | ICD-10-CM

## 2020-06-24 NOTE — Progress Notes (Signed)
C- please schedule the CT scan prior to next visit.   Heather/Zaira- please inform the CT scan is ordered; and thanks for calling us.   GB

## 2020-06-24 NOTE — Telephone Encounter (Signed)
[facility] - please schedule ct scan

## 2020-06-24 NOTE — Telephone Encounter (Signed)
Patient called asking if the appointments on 7/6 or7/8 are for a CT Scan. I do not see that it was scheduled nor is it mentioned in the disposition wrap up note, but I do see an order entered in Epic for it and also mentioned in office note. Is she to have CT prior to her appointment? She requests a return call regarding this  ASSESSMENT & PLAN:    Endometrial cancer ([place]) # STAGE IV-High-grade serous carcinoma endometrial] pre-treatment-CEA 125 +1500; s/p 7cycles of Carbo-Taxol; s/p debulking surgery with bowel resections.     # proceed with #10  CarboTaxol. Labs today reviewed;  acceptable for treatment today. [carboplatin AUC 5; ANC 1.3 Taxol decrease dose to 80%];  Will plan CT after cycle #10/ordered scan today.   # Nausea-underlying disease versus GI causes/chemo; add prilosec BID; Compazine/Zofran.  Stable.   # fatgiue- depression-stable on prozac to 40 mg/day   #Myalgias from Taxol-recommend prednisone 10 mg for 5 days post chemotherapy.   # DISPOSITION: #  Chemo today; # Follow up in 4 weeks- MD; labs- cbc/cmp/ca-125; No chemo; - Dr.B       All questions were answered. The patient knows to call the clinic with any problems, questions or concerns.    Cammie Sickle, MD 06/11/2020 8:44 PM

## 2020-07-08 ENCOUNTER — Other Ambulatory Visit: Payer: Self-pay

## 2020-07-08 ENCOUNTER — Ambulatory Visit
Admission: RE | Admit: 2020-07-08 | Discharge: 2020-07-08 | Disposition: A | Discharge status: Home / Self Care | Payer: Medicare Other | Source: Ambulatory Visit | Attending: Internal Medicine | Admitting: Internal Medicine

## 2020-07-08 DIAGNOSIS — C541 Malignant neoplasm of endometrium: Secondary | ICD-10-CM | POA: Insufficient documentation

## 2020-07-08 MED ORDER — IOHEXOL 300 MG/ML  SOLN
100.0000 mL | Freq: Once | INTRAMUSCULAR | Status: AC | PRN
  Administered 2020-07-08: 100 mL via INTRAVENOUS

## 2020-07-09 ENCOUNTER — Inpatient Hospital Stay: Payer: Medicare Other | Attending: Obstetrics and Gynecology | Admitting: Obstetrics and Gynecology

## 2020-07-09 VITALS — BP 129/63 | HR 78 | Temp 98.2°F | Resp 18 | Wt 149.0 lb

## 2020-07-09 DIAGNOSIS — Z79899 Other long term (current) drug therapy: Secondary | ICD-10-CM | POA: Diagnosis not present

## 2020-07-09 DIAGNOSIS — I10 Essential (primary) hypertension: Secondary | ICD-10-CM | POA: Diagnosis not present

## 2020-07-09 DIAGNOSIS — Z90710 Acquired absence of both cervix and uterus: Secondary | ICD-10-CM | POA: Diagnosis not present

## 2020-07-09 DIAGNOSIS — C786 Secondary malignant neoplasm of retroperitoneum and peritoneum: Secondary | ICD-10-CM | POA: Insufficient documentation

## 2020-07-09 DIAGNOSIS — B1920 Unspecified viral hepatitis C without hepatic coma: Secondary | ICD-10-CM | POA: Insufficient documentation

## 2020-07-09 DIAGNOSIS — Z9221 Personal history of antineoplastic chemotherapy: Secondary | ICD-10-CM | POA: Diagnosis not present

## 2020-07-09 DIAGNOSIS — Z90722 Acquired absence of ovaries, bilateral: Secondary | ICD-10-CM | POA: Insufficient documentation

## 2020-07-09 DIAGNOSIS — K219 Gastro-esophageal reflux disease without esophagitis: Secondary | ICD-10-CM | POA: Diagnosis not present

## 2020-07-09 DIAGNOSIS — I2510 Atherosclerotic heart disease of native coronary artery without angina pectoris: Secondary | ICD-10-CM | POA: Diagnosis not present

## 2020-07-09 DIAGNOSIS — C541 Malignant neoplasm of endometrium: Secondary | ICD-10-CM | POA: Diagnosis present

## 2020-07-09 DIAGNOSIS — F17210 Nicotine dependence, cigarettes, uncomplicated: Secondary | ICD-10-CM | POA: Insufficient documentation

## 2020-07-09 NOTE — Progress Notes (Signed)
Gynecologic Oncology Interval Visit   Referring Provider: Dr. 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 G4P3 female (h/o ruptured ectopic s/p XL) who is seen in consultation from Dr. Rogue Bussing and Bary Castilla for elevated high grade serous cancer s/p 7 cycles carboplatin-Taxol chemotherapy followed by TLH-BSO on 03/27/20 who returns to clinic for re-evaluation. She has a medical history significant for Hepatitis C (ectopic pregnancy in 1990 requiring transfusion).   # April 13th-restart CarboTaxol No. 8. She has now received 10 cycles - last treatment 06/11/2020. Carboplatin AUC 5; ANC 1.3 Taxol decrease dose to 80%.  CT 07/09/2020 IMPRESSION: Status post hysterectomy, bilateral salpingo oophorectomy, and omentectomy. Status post left hemicolectomy.  Mild peritoneal disease in the left upper abdomen, difficult to distinguish from adjacent loops of colon, but grossly unchanged. No abdominopelvic ascites.  No evidence of metastatic disease in the abdomen/pelvis.  Result Notes  Component Ref Range & Units 4 wk ago  (06/11/20) 1 mo ago  (05/28/20) 2 mo ago  (05/07/20) 2 mo ago  (04/16/20) 4 mo ago  (02/19/20) 5 mo ago  (02/05/20) 5 mo ago  (01/16/20)  Cancer Antigen (CA) 125 0.0 - 38.1 U/mL 42.5 High   43.3 High  CM  59.0 High  CM  111.0 High  CM  54.6 High  CM  43.8 High  CM  72.3 High  CM       She presents for a pelvic exam today. She will be seeing Dr. Patrecia Pace on 07/11/2020.   Gynecologic Oncology History Jeanette Allen is a pleasant female who is seen in consultation from Dr. Charlynn Grimes and Bary Castilla for elevated CA125 and ascites. She has a medical history significant for Hepatitis C (ectopic pregnancy in 1990 requiring transfusion).  Her history is as follows:  She presented with abdominal pain for 2-3 weeks, daily nausea (episode of emesis of CT contrast), early satiety and abdominal bloating.    08/20/2019 US Abdomen   Cirrhotic liver with small amount of perihepatic ascites. Multiple peripancreatic and perihepatic hypoechoic nodules, the largest of which measures 2.0 cm at the inferior liver edge. Recommend CT for further evaluation.   08/22/2019 The patient underwent a contrast-enhanced CT. This described possibility of gas around the gallbladder. Cholecystitis is a consideration.  Extensive abdominal and pelvic ascites. No retroperitoneal masses. No evidence of an acute inflammatory process. Suspected uterine fibroid. No free air.   08/23/2019 She saw Dr. Bary Castilla for evaluation given gallbladder findings.   Labs: elevated hepatitis C virus antibody. Liver function with elevated transaminases. Normal total bilirubin, albumin and electrolytes. Normal renal function. CBC showed a hemoglobin of 15.6 with an MCV of 95, white blood cell count of 6500 with normal differential. Hemoglobin A1c 5.5.  CA125= 1228; CEA= 2.1; alpha-fetoprotein = 2,2, and CA19-9 <2.  08/27/2019 Paracentesis total of approximately 1250 mL of clear, amber colored fluid was Removed. LDH 334; ANC42; total nucleated cell 3,035; No WBC; No organisms; No growth; Albumin 2.8; total protein 5.0; Glucose 98. Cytology- POSITIVE FOR MALIGNANCY. - COMPATIBLE WITH HIGH-GRADE SEROUS CARCINOMA.   Attempted endometrial biopsy but procedure aborted due to cervical stenosis.   09/07/2019 biopsy of peritoneal mass DIAGNOSIS:  A. PERITONEAL MASS, LEFT LATERAL; CT-GUIDED CORE BIOPSY:  - METASTATIC HIGH-GRADE SEROUS CARCINOMA.   09/12/2019 DUODENAL BULB; COLD BIOPSY DIAGNOSIS:  A. DUODENAL BULB; COLD BIOPSY:  - DUODENAL MUCOSA DISPLAYING NORMAL VILLOUS ARCHITECTURE AND MILD  BRUNNER GLAND HYPERPLASIA.  - NEGATIVE FOR FEATURES OF  CELIAC, DYSPLASIA, AND MALIGNANCY.   PET 09/13/2019 IMPRESSION: 1. Diffuse and extensive peritoneal carcinomatosis as detailed above. The uterus and the sigmoid colon are abnormal and the primary neoplasm could be the sigmoid colon  or possibly endometrial carcinoma. Given their close proximity it is also possible that this is sigmoid colon cancer invading the uterus or less likely vice versa. Recommend omental biopsy or sigmoidoscopy. 2. Single left upper lobe pulmonary nodule is mildly hypermetabolic and could reflect metastatic disease.  Given extensive nature of disease recommendation was for NACT.   09/14/19- Cycle 1  arboplatin/Taxol CA125 1,501 10/05/19- Cycle 2  CA125 1,196  10/11/19- US Venous Bilateral for leg pain post chemotherapy was negative for DVT  10/26/19- Cycle 3 CA125 614  11/06/2019- CT for restaging- Decreased ascites and decreased size of bulky omental disease and areas of peritoneal disease. Omental disease remains bulky as described. Mild to moderate improvement in upper abdominal adenopathy. Particularly with respect to gastrohepatic implants or lymph nodes that were seen previously. Resolution of LEFT- sided pleural fluid. Subjective decrease in mesenteric thickening. New signs of disease though smaller areas would be difficult to detect given the extensive nature of preexistent disease. No evidence of metastatic disease to the chest. Aortic atherosclerosis. Mild distension of distal small bowel in the setting of peritoneal disease with mild angulation of some small bowel loops.   11/16/19- Cycle 4 CA125 260  On 12/13/2019 she underwent EUA, diagnostic laparoscopy and D&C.   Upon diagnostic laparoscopy the Fagotti score was calculated based on: Tumor diffusion along the omentum up to the large stomach curvature score 2 Massive peritoneal involvement and/or miliary pattern of distribution of peritoneal carcinomatosis score 2 Wide spread infiltrating carcinomatosis and/or confluent nodules to the majority of the diaphragmatic surface absent, score 0 Large infiltrating nodules and/or an involvement of the root of the mesentery supposed on the basis of limited movements of the various intestinal segments  2 Possible large/small bowel resection (excluding recto-sigmoid resection) and/or extended carcinomatosis on the ansae score 2 Obvious neoplastic involvement of the gastric wall score 2 Liver surface lesions larger than 2 cm score 0   Total score: 10  A.  Endometrium, curettage:   Scant atypical cells present suspicious but not diagnostic of malignancy, A1-4    WT-1   Negative A1-5    P16     Patchy positive A1-6    P53     Positive/abnormal in atypical cells   These findings are suspicious but not diagnostic of malignancy.  Evaluation is limited by scant cellularity.    We recommended additional chemotherapy.  12/26/2019 Cycle #5 CA125 99.3 01/29/2020 Cycle #6 Dose reduction carboplatin AUC- 5 secondary thrombocytopenia at cycle #6.  CA125 72.3 02/05/20 CA125 43.8 02/19/2020 she underwent cycle #7 of chemotherapy. AST/ALT-G-1-2; secondary to Taxol. CA125 on 02/19/20 54.6   02/29/2020 PET scan CHEST: Previous described LEFT upper lobe nodule is no longer identified. Hypermetabolic mediastinal lymph nodes.   ABDOMEN/PELVIS: Dramatic reduction in multifocal hypermetabolic peritoneal carcinomatosis with near complete resolution of the previously intensely radiotracer avid and extensive peritoneal carcinomatosis.   Two foci of hypermetabolic of peritoneal tissue remain. LEFT upper quadrant tubular thickening measuring 3.2 by 1.2 cm with SUV max equal 4.5. This is reduced significantly in size from bulky peritoneal mass with SUV max equal 9.6 on prior.   Second focus of radiotracer activity along the inferior margin of the LEFT hepatic lobe just beneath the falciform ligament with SUV max equal 5.1. This activity is more difficult  to place and presumed along the surface of the liver but cannot fully exclude physiologic activity in adjacent bowel.   Resolution of the previously seen bulky hypermetabolic tissue associated with the sigmoid colon. Near complete resolution of the metabolic activity  associated with the uterus. Mild activity remain the uterus which is nonspecific (SUV max equal 3.8 on image 75).   IMPRESSION: 1. Dramatic positive response to therapy with near complete resolution of the hypermetabolic peritoneal metastasis. 2. Tubular focus of hypermetabolic peritoneal thickening in the LEFT upper quadrant does remain and has moderate metabolic activity decreased from bulky intense radiotracer activity on prior. 3. A second focus of metabolic activity along the inferior margin LEFT hepatic lobe may also represent mild residual disease. 4. Resolution of metabolic activity within the uterus and sigmoid colon. Mild residual activity may be physiologic. 5. Resolution of intraperitoneal free fluid. 6. Resolution of LEFT upper lobe pulmonary nodule.  03/27/20 - TAH- BSO, omentectomy, sigmoid bowel resection with loop ileostomy and colonic mucous fistula by Dr. Theora Gianotti. Pathology: Endometrial adenocarcinoma (3.0 cm), serous type. Tumor invades the full thickness of the myometrium to involve the uterine serosa (1.9 of 1.9 cm). Right ovary: Metastatic adenocarcinoma, involving the ovarian surface and outer parenchyma. Left ovary: Metastatic adenocarcinoma, involving the ovarian surface and outer parenchyma.  04/16/20 She resumed chemotherapy with paclitaxel and carboplatin, cycle #8.    Genetic testing - [facility] My[facility] was negative.   Negative germline genetic testing. No pathogenic variants identified on the [facility] panel. The report date is 10/01/2019. HRD testing ([employer]) still pending.    The [facility] gene panel offered by [employer] includes sequencing and deletion/duplication testing of the following 35 genes: APC, ATM, AXIN2, BARD1, BMPR1A, BRCA1, BRCA2, BRIP1, CHD1, CDK4, CDKN2A, CHEK2, EPCAM (large rearrangement only), HOXB13, GALNT12, MLH1, MSH2, MSH3, MSH6, MUTYH, NBN, NTHL1, PALB2, PMS2, PTEN, RAD51C, RAD51D, RNF43, RPS20, SMAD4, STK11, and TP53.  Sequencing was performed for select regions of POLE and POLD1, and large rearrangement analysis was performed for select regions of GREM1.     Tumor testing -  ATM and T53 alterations; ESRI-CCDC170 fusion [employer]. Insufficient tissue from biopsy for HRD testing. Opted for [facility] upfront. Post surgery, pathology consistent with endometrial cancer. 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 ([place]) 09/26/2019   Goals of care, counseling/discussion 09/04/2019   Hepatitis C infection 08/29/2019   CAD (coronary artery disease) 08/29/2019   Depression 08/29/2019   Esophageal reflux 08/29/2019   Hyperlipidemia 08/29/2019   Hypertension 08/29/2019   Nonrheumatic mitral valve disorder 08/29/2019   Vitamin B12 deficiency 08/29/2019   Vitamin D deficiency 08/29/2019   Tobacco abuse 08/29/2019   Acute hepatitis C 08/13/2019   DDD (degenerative disc disease), lumbar 08/02/2018   Elevation of level of transaminase and lactic acid dehydrogenase (LDH) 02/18/2017   Nausea 06/27/2013    Past Medical History: Past Medical History:  Diagnosis Date   Arthritis    Cancer ([place])  Hypertension    Past Surgical History: Past Surgical History:  Procedure Laterality Date   APPENDECTOMY     COLONOSCOPY WITH PROPOFOL N/A 10/01/2019   Procedure: COLONOSCOPY WITH PROPOFOL;  Surgeon: Lin Landsman, MD;  Location[facility] ENDOSCOPY;  Service: Gastroenterology;  Laterality: N/A;   ESOPHAGOGASTRODUODENOSCOPY (EGD) WITH PROPOFOL  N/A 09/12/2019   Procedure: ESOPHAGOGASTRODUODENOSCOPY (EGD) WITH PROPOFOL;  Surgeon: Lin Landsman, MD;  Location: [facility] ENDOSCOPY;  Service: Gastroenterology;  Laterality: N/A;  colonoscopy 2011 Tubal pregnancy 1990 Tubal reversal  Past Gynecologic History:  Menarche: age 89 Last menstrual period: age 1  OB History:  OB History  Gravida Para Term Preterm AB Living  '4 3     1    ' SAB IAB Ectopic Multiple Live Births      1        # Outcome Date GA Lbr Len/2nd Weight Sex Delivery Anes PTL Lv  4 Ectopic           3 Para           2 Para           1 Para             Family History: Family History  Problem Relation Age of Onset   Diabetes Maternal Grandmother    Hypertension Maternal Grandmother    Stroke Maternal Grandmother    Immunization History  Administered Date(s) Administered   Fluad Quad(high Dose 65+) 10/26/2019   Moderna Sars-Covid-2 Vaccination 05/24/2019, 06/21/2019   Social History: Social History   Socioeconomic History   Marital status: Married    Spouse name: Not on file   Number of children: Not on file   Years of education: Not on file   Highest education level: Not on file  Occupational History   Not on file  Tobacco Use   Smoking status: Every Day    Pack years: 0.00   Smokeless tobacco: Never  Vaping Use   Vaping Use: Never used  Substance and Sexual Activity   Alcohol use: Not Currently   Drug use: Never   Sexual activity: Yes  Other Topics Concern   Not on file  Social History Narrative   Lives with husband; near [place]. Worked Network engineer at [employer]; smoke 1/2 ppd/slowed; no alcohol; 3 children [2 boys; one girl- [employer]]   Social Determinants of Health   Financial Resource Strain: Not on file  Food Insecurity: Not on file  Transportation Needs: Not on file  Physical Activity: Not on file  Stress: Not on file  Social Connections: Not on file  Intimate Partner Violence: Not on file   Immunization History   Administered Date(s) Administered   Fluad Quad(high Dose 65+) 10/26/2019   Moderna Sars-Covid-2 Vaccination 05/24/2019, 06/21/2019   Allergies: No Known Allergies  Current Medications: Current Outpatient Medications  Medication Sig Dispense Refill   acetaminophen (TYLENOL) 500 MG tablet Take by mouth.     ergocalciferol (VITAMIN D2) 1.25 MG (50000 UT) capsule Take by mouth.     FLUoxetine (PROZAC) 40 MG capsule Take 1 capsule (40 mg total) by mouth daily. 30 capsule 3   omeprazole (PRILOSEC) 40 MG capsule Take 1 capsule (40 mg total) by mouth 2 (two) times daily before a meal. 60 capsule 1   ondansetron (ZOFRAN) 8 MG tablet One pill every 8 hours as needed for nausea/vomitting. 60 tablet 1   ondansetron (ZOFRAN-ODT) 4 MG disintegrating tablet Take 4 mg by mouth every 8 (eight) hours as needed for  nausea or vomiting.     prochlorperazine (COMPAZINE) 10 MG tablet Take 1 tablet (10 mg total) by mouth every 6 (six) hours as needed for nausea or vomiting. 40 tablet 1   losartan (COZAAR) 50 MG tablet Take by mouth. (Patient not taking: No sig reported)     oxyCODONE (OXY IR/ROXICODONE) 5 MG immediate release tablet Take by mouth. (Patient not taking: No sig reported)     polyethylene glycol powder (GLYCOLAX/MIRALAX) 17 GM/SCOOP powder Take by mouth. (Patient not taking: Reported on 06/11/2020)     No current facility-administered medications for this visit.   Review of Systems General: fatigue  HEENT: no complaints  Lungs: no complaints  Cardiac: no complaints  GI: no complaints  GU: no complaints  Musculoskeletal: back pain  Extremities: no complaints  Skin: no complaints  Neuro: no complaints  Endocrine: no complaints  Psych: no complaints        Objective:  Physical Examination:  BP 129/63 (Patient Position: Sitting)   Pulse 78   Temp 98.2 F (36.8 C) (Tympanic)   Resp 18   Wt 149 lb (67.6 kg)   SpO2 100%   BMI 25.58 kg/m    ECOG Performance Status: 1 - Symptomatic  but completely ambulatory  GENERAL: Patient is a well appearing female in no acute distress HEENT:  atraumatic and normocephalic ABDOMEN:  Soft, nontender, nondistended. No obvious masses or ascites. Ostomy in the RLQ red and healthy appearing, slightly prolapsed but easily reducible. Mucous fistula covered with bandage.   MSK:  No focal spinal tenderness to palpation. Full range of motion bilaterally in the upper extremities. EXTREMITIES:  No peripheral edema.   SKIN:  Clear with no obvious rashes or skin changes. No nail dyscrasia. Incisions intact and no evidence of hernia.  NEURO:  Nonfocal. Well oriented.  Appropriate affect.  Pelvic: EGBUS: no lesions Cervix: surgically absent Vagina: no lesions, no discharge or bleeding Uterus: surgically absent BME: no palpable masses  Lab Review CBC Latest Ref Rng & Units 06/11/2020 05/28/2020 05/23/2020  WBC 4.0 - 10.5 K/uL 3.2(L) 2.6(L) 1.2(LL)  Hemoglobin 12.0 - 15.0 g/dL 11.6(L) 10.4(L) 10.6(L)  Hematocrit 36.0 - 46.0 % 33.3(L) 29.1(L) 29.7(L)  Platelets 150 - 400 K/uL 189 66(L) 51(L)  ANC 800 (11/07/19)    Chemistry      Component Value Date/Time   NA 137 06/11/2020 0824   K 4.5 06/11/2020 0824   CL 107 06/11/2020 0824   CO2 22 06/11/2020 0824   BUN 18 06/11/2020 0824   CREATININE 0.98 06/11/2020 0824      Component Value Date/Time   CALCIUM 9.8 06/11/2020 0824   ALKPHOS 91 06/11/2020 0824   AST 53 (H) 06/11/2020 0824   ALT 83 (H) 06/11/2020 0824   BILITOT 0.3 06/11/2020 0824     Radiologic Imaging: No imaging on site today    Assessment:  JERONICA STLOUIS is a 65 y.o. 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 PET imaging concerning for endometrial primary. EMBx could not be performed due to cervical stenosis, but curettage performed in the OR on 12/13/2019 and concerning but not diagnostic of malignancy. S/p 7 cycles  paclitaxel/carboplatin with evidence of response based in CA125 and PET. Underwent TAH, BSO, RS resection, loop ileostomy, mucous fistula 3/22.  A diverting loop ileostomy done given concern for possible large bowel obstruction at the levels of the cecum and the transverse colon; and a mucous fistula.  Cancer was involving uterus, both ovaries and sigmoid mesentery and there was residual disease.  Clearly is an endometrial high grade serous primary. HER2 negative, MSS  Peritoneal disease.  Mediastinal adenopathy, resolved.  Pulmonary nodule, resolved. Stable irregular focal cystic change in the right lower lobe (series 3/image 91), unchanged  Hepatitis C, elevated AST/ALT normal bilirubin and albumin.   Medical co-morbidities complicating care: HTN and prior abdominal surgery and Hepatitis C.  Plan:   Problem List Items Addressed This Visit       Genitourinary   Endometrial cancer ([place]) - Primary    Continue to follow up with Dr Rogue Bussing regarding treatment recommendations.  Plan to reassess in 3 months.  If she progresses then pembro/lenvima would be the next option.    Hepatitis C. Continue to follow LFTs.    Jeanette Genrich Gaetana Michaelis, MD

## 2020-07-11 ENCOUNTER — Inpatient Hospital Stay: Payer: Medicare Other

## 2020-07-11 ENCOUNTER — Other Ambulatory Visit: Payer: Self-pay

## 2020-07-11 ENCOUNTER — Inpatient Hospital Stay (HOSPITAL_BASED_OUTPATIENT_CLINIC_OR_DEPARTMENT_OTHER): Payer: Medicare Other | Admitting: Internal Medicine

## 2020-07-11 DIAGNOSIS — C541 Malignant neoplasm of endometrium: Secondary | ICD-10-CM | POA: Diagnosis not present

## 2020-07-11 LAB — COMPREHENSIVE METABOLIC PANEL
ALT: 59 U/L — ABNORMAL HIGH (ref 0–44)
AST: 45 U/L — ABNORMAL HIGH (ref 15–41)
Albumin: 3.9 g/dL (ref 3.5–5.0)
Alkaline Phosphatase: 81 U/L (ref 38–126)
Anion gap: 6 (ref 5–15)
BUN: 14 mg/dL (ref 8–23)
CO2: 22 mmol/L (ref 22–32)
Calcium: 9.9 mg/dL (ref 8.9–10.3)
Chloride: 110 mmol/L (ref 98–111)
Creatinine, Ser: 0.87 mg/dL (ref 0.44–1.00)
GFR, Estimated: >60 mL/min (ref >60)
Glucose, Bld: 96 mg/dL (ref 70–99)
Potassium: 4.5 mmol/L (ref 3.5–5.1)
Sodium: 138 mmol/L (ref 135–145)
Total Bilirubin: 0.8 mg/dL (ref 0.3–1.2)
Total Protein: 7.5 g/dL (ref 6.5–8.1)

## 2020-07-11 LAB — CBC WITH DIFFERENTIAL/PLATELET
Abs Immature Granulocytes: 0.01 10*3/uL (ref 0.00–0.07)
Basophils Absolute: 0 10*3/uL (ref 0.0–0.1)
Basophils Relative: 1 %
Eosinophils Absolute: 0.1 10*3/uL (ref 0.0–0.5)
Eosinophils Relative: 3 %
HCT: 34.4 % — ABNORMAL LOW (ref 36.0–46.0)
Hemoglobin: 11.9 g/dL — ABNORMAL LOW (ref 12.0–15.0)
Immature Granulocytes: 0 %
Lymphocytes Relative: 20 %
Lymphs Abs: 1.1 10*3/uL (ref 0.7–4.0)
MCH: 37.1 pg — ABNORMAL HIGH (ref 26.0–34.0)
MCHC: 34.6 g/dL (ref 30.0–36.0)
MCV: 107.2 fL — ABNORMAL HIGH (ref 80.0–100.0)
Monocytes Absolute: 0.5 10*3/uL (ref 0.1–1.0)
Monocytes Relative: 8 %
Neutro Abs: 3.9 10*3/uL (ref 1.7–7.7)
Neutrophils Relative %: 68 %
Platelets: 119 10*3/uL — ABNORMAL LOW (ref 150–400)
RBC: 3.21 MIL/uL — ABNORMAL LOW (ref 3.87–5.11)
RDW: 14.6 % (ref 11.5–15.5)
WBC: 5.7 10*3/uL (ref 4.0–10.5)
nRBC: 0 % (ref 0.0–0.2)

## 2020-07-11 NOTE — Progress Notes (Signed)
Patient reports intermittent bilaterally hip pain/lower back pain. The pain is not bothering her this morning. The pain often is worse on the right side of her back and radiates down her leg. The pain awaken her last evening.

## 2020-07-11 NOTE — Progress Notes (Signed)
[facility] NOTE  Patient Care Team: Danelle Berry, NP as PCP - General (Nurse Practitioner) Clent Jacks, RN as Oncology Nurse Navigator Cammie Sickle, MD as Consulting Physician (Internal Medicine) Gillis Ends, MD as Referring Physician (Obstetrics)  CHIEF COMPLAINTS/PURPOSE OF CONSULTATION: high grade serous cancer   #  Oncology History Overview Note  #August 2021-ascitic fluid cytology-high-grade serous carcinoma of gynecologic origin; CT scan-behind the abdominal wall 4.6 x 2.1 mass ; within the mesentery 6.7 x 4.5 cm left of mid abdomen . BIOPSY of the peritoneal mass; high grade carcinoma [phone] [Dr.Byrnett]; SEP 9th 2021- PET scan-shows peritoneal carcinomatosis; uterine uptake [Dr.Secord; unable to biopsy cervical stenosis] sigmoid colonic uptake concerning for malignancy.  No ovarian uptake.  CEA 125 +1300; CEA-2.    #Hepatitis C/ectopic pregnancy/PRBC transfusion 1995-untreated.[Dr.Vanga]  # 09/14/2019-neoadjuvant CARBO-TAXOL s/p #4 cycles-partial response; DEC 9th 2021-laparoscopy-surgery aborted given the need for multiple bowel resections; DEc 22nd, 2021-proceed with neoadjuvant carbotaxol; STARTING cycle #6- AUC-5 sec to persistent thrombocytopenia.  #Status post #7 CarboTaxol -March 2022-debulking surgery TAH/BSO; bowel resections; ileostomy/mucous fistula.  # April 13th-restart CarboTaxol No. 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 GENETICS- NEG; HRD-    # PALLIATIVE CARE EVALUATION:  # PAIN MANAGEMENT:    DIAGNOSIS: Uterine cancer  STAGE:   IV      ;  GOALS: control  CURRENT/MOST RECENT THERAPY : Carbotaxol    Uterine cancer ([place]) (Resolved)  09/04/2019 Initial Diagnosis   Gynecologic cancer [facility])   Endometrial cancer ([place])  09/14/2019 -  Chemotherapy    Patient is on Treatment Plan:  OVARIAN CARBOPLATIN (AUC 6) / PACLITAXEL (175) Q21D X 6 CYCLES       10/05/2019 Initial Diagnosis   Endometrial cancer ([facility])     Genetic Testing   Negative germline genetic testing. No pathogenic variants identified on the [facility] panel. The report date is 10/01/2019. HRD testing was ordered but cancelled due to not enough sample/endometrial diagnosis.   The [facility] gene panel offered by [employer] includes sequencing and deletion/duplication testing of the following 35 genes: APC, ATM, AXIN2, BARD1, BMPR1A, BRCA1, BRCA2, BRIP1, CHD1, CDK4, CDKN2A, CHEK2, EPCAM (large rearrangement only), HOXB13, GALNT12, MLH1, MSH2, MSH3, MSH6, MUTYH, NBN, NTHL1, PALB2, PMS2, PTEN, RAD51C, RAD51D, RNF43, RPS20, SMAD4, STK11, and TP53. Sequencing was performed for select regions of POLE and POLD1, and large rearrangement analysis was performed for select regions of GREM1.    01/16/2020 Cancer Staging   Staging form: Corpus Uteri - Carcinoma and Carcinosarcoma, AJCC 8th Edition - Clinical: Stage IVB (cM1) - Signed by Cammie Sickle, MD on 01/16/2020       HISTORY OF PRESENTING ILLNESS:  Jeanette Allen 65 y.o.  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  In the interim patient was evaluated by gynecology oncology.   Patient complains of mild tingling and numbness in extremities.  Mild to moderate fatigue.  Otherwise no falls.  No headaches.  No nausea no vomiting.  Review of Systems  Constitutional:  Positive for malaise/fatigue. Negative for chills, diaphoresis, fever and weight loss.  HENT:  Negative for nosebleeds and sore throat.   Eyes:  Negative for double vision.  Respiratory:  Negative for cough, hemoptysis, sputum production, shortness of breath and wheezing.   Cardiovascular:  Negative for chest  pain, palpitations, orthopnea and leg  swelling.  Gastrointestinal:  Positive for constipation. Negative for blood in stool, diarrhea, heartburn, melena and vomiting.  Genitourinary:  Negative for dysuria, frequency and urgency.  Musculoskeletal:  Positive for myalgias. Negative for back pain and joint pain.  Skin: Negative.  Negative for itching and rash.  Neurological:  Negative for dizziness, tingling, focal weakness, weakness and headaches.  Endo/Heme/Allergies:  Does not bruise/bleed easily.  Psychiatric/Behavioral:  Negative for depression. The patient is not nervous/anxious and does not have insomnia.     MEDICAL HISTORY:  Past Medical History:  Diagnosis Date  . Arthritis   . Cancer ([place])   . Hypertension     SURGICAL HISTORY: Past Surgical History:  Procedure Laterality Date  . APPENDECTOMY    . COLONOSCOPY WITH PROPOFOL N/A 10/01/2019   Procedure: COLONOSCOPY WITH PROPOFOL;  Surgeon: Lin Landsman, MD;  [facility] ENDOSCOPY;  Service: Gastroenterology;  Laterality: N/A;  . ESOPHAGOGASTRODUODENOSCOPY (EGD) WITH PROPOFOL N/A 09/12/2019   Procedure: ESOPHAGOGASTRODUODENOSCOPY (EGD) WITH PROPOFOL;  Surgeon: Lin Landsman, MD;  Location: [facility] ENDOSCOPY;  Service: Gastroenterology;  Laterality: N/A;    SOCIAL HISTORY: Social History   Socioeconomic History  . Marital status: Married    Spouse name: Not on file  . Number of children: Not on file  . Years of education: Not on file  . Highest education level: Not on file  Occupational History  . Not on file  Tobacco Use  . Smoking status: Every Day    Pack years: 0.00  . Smokeless tobacco: Never  Vaping Use  . Vaping Use: Never used  Substance and Sexual Activity  . Alcohol use: Not Currently  . Drug use: Never  . Sexual activity: Yes  Other Topics Concern  . Not on file  Social History Narrative   Lives with husband; near [place]. Worked Network engineer at [employer]; smoke 1/2 ppd/slowed; no alcohol; 3 children [2 boys; one girl- [employer]]   Social Determinants of Health   Financial Resource Strain: Not on file  Food Insecurity: Not on file  Transportation Needs: Not on file  Physical Activity: Not on file  Stress: Not on file  Social Connections: Not on file  Intimate Partner Violence: Not on file    FAMILY HISTORY: Family History  Problem Relation Age of Onset  . Diabetes Maternal Grandmother   . Hypertension Maternal Grandmother   . Stroke Maternal Grandmother     ALLERGIES:  has No Known Allergies.  MEDICATIONS:  Current Outpatient Medications  Medication Sig Dispense Refill  . acetaminophen (TYLENOL) 500 MG tablet Take by mouth.    . ergocalciferol (VITAMIN D2) 1.25 MG (50000 UT) capsule Take by mouth.    Marland Kitchen FLUoxetine (PROZAC) 40 MG capsule Take 1 capsule (40 mg total) by mouth daily. 30 capsule 3  . omeprazole (PRILOSEC) 40 MG capsule Take 1 capsule (40 mg total) by mouth 2 (two) times daily before a meal. 60 capsule 1  . ondansetron (ZOFRAN) 8 MG tablet One pill every 8 hours as needed for nausea/vomitting. (Patient not taking: Reported on 07/11/2020) 60 tablet 1  . ondansetron (ZOFRAN-ODT) 4 MG disintegrating tablet Take 4 mg by mouth every 8 (eight) hours as needed for nausea or vomiting. (Patient not taking: Reported on 07/11/2020)    . prochlorperazine (COMPAZINE) 10 MG tablet Take 1 tablet (10 mg total) by mouth every 6 (six) hours as needed for nausea or vomiting. (Patient not taking: Reported on 07/11/2020) 40 tablet 1  No current facility-administered medications for this visit.      Marland Kitchen  PHYSICAL EXAMINATION: ECOG PERFORMANCE STATUS: 1 - Symptomatic but completely ambulatory  Vitals:   07/11/20 1005  BP: 130/61  Pulse: 67  Resp: 18  Temp: 97.8 F (36.6 C)   Filed Weights   07/11/20 1005  Weight: 149 lb (67.6 kg)    Physical Exam Constitutional:      Comments: Ambulating independently.  Accompanied by husband.  HENT:     Head: Normocephalic and atraumatic.     Mouth/Throat:      Pharynx: No oropharyngeal exudate.  Eyes:     Pupils: Pupils are equal, round, and reactive to light.  Cardiovascular:     Rate and Rhythm: Normal rate and regular rhythm.  Pulmonary:     Effort: Pulmonary effort is normal. No respiratory distress.     Breath sounds: Normal breath sounds. No wheezing.  Abdominal:     General: Bowel sounds are normal. There is distension.     Palpations: Abdomen is soft. There is no mass.     Tenderness: no abdominal tenderness There is no guarding or rebound.  Musculoskeletal:        General: No tenderness. Normal range of motion.     Cervical back: Normal range of motion and neck supple.  Skin:    General: Skin is warm.  Neurological:     Mental Status: She is alert and oriented to person, place, and time.  Psychiatric:        Mood and Affect: Affect normal.     LABORATORY DATA:  I have reviewed the data as listed Lab Results  Component Value Date   WBC 5.7 07/11/2020   HGB 11.9 (L) 07/11/2020   HCT 34.4 (L) 07/11/2020   MCV 107.2 (H) 07/11/2020   PLT 119 (L) 07/11/2020   Recent Labs    09/14/19 0813 09/28/19 1006 10/05/19 0807 10/15/19 1107 05/28/20 0800 06/11/20 0824 07/11/20 0945  NA 139 138 140   < > 138 137 138  K 4.0 4.0 4.3   < > 4.0 4.5 4.5  CL 104 104 104   < > 108 107 110  CO2 '24 26 27   ' < > 20* 22 22  GLUCOSE 104* 106* 108*   < > 135* 103* 96  BUN '12 14 13   ' < > '19 18 14  ' CREATININE 0.93 0.73 0.86   < > 0.85 0.98 0.87  CALCIUM 9.2 9.0 9.9   < > 9.5 9.8 9.9  GFRNONAA >60 >60 >60   < > >60 >60 >60  GFRAA >60 >60 >60  --   --   --   --   PROT 7.5  --  7.6   < > 7.5 7.8 7.5  ALBUMIN 3.5  --  4.0   < > 3.8 4.1 3.9  AST 30  --  37   < > 37 53* 45*  ALT 21  --  30   < > 57* 83* 59*  ALKPHOS 71  --  72   < > 94 91 81  BILITOT 0.5  --  0.5   < > 0.5 0.3 0.8   < > = values in this interval not displayed.    RADIOGRAPHIC STUDIES: I have personally reviewed the radiological images as listed and agreed with the  findings in the report. CT CHEST ABDOMEN PELVIS W CONTRAST  Result Date: 07/09/2020 CLINICAL DATA:  Restaging endometrial cancer, diagnosed July 2021. Status post  hysterectomy, partial colon resection with colostomy, with chemotherapy ongoing. EXAM: CT CHEST, ABDOMEN, AND PELVIS WITH CONTRAST TECHNIQUE: Multidetector CT imaging of the chest, abdomen and pelvis was performed following the standard protocol during bolus administration of intravenous contrast. CONTRAST:  127m OMNIPAQUE IOHEXOL 300 MG/ML  SOLN COMPARISON:  PET-CT dated 02/28/2020. CT chest abdomen pelvis dated 11/06/2019. FINDINGS: CT CHEST FINDINGS Cardiovascular: The heart is normal in size. No pericardial effusion. No evidence of thoracic aortic aneurysm. Atherosclerotic calcifications of the aortic arch. Coronary atherosclerosis of the LAD and left circumflex. Mediastinum/Nodes: No suspicious mediastinal lymphadenopathy. Visualized thyroid is unremarkable. Lungs/Pleura: Mild biapical pleural-parenchymal scarring. No suspicious pulmonary nodules. Stable irregular focal cystic change in the right lower lobe (series 3/image 91), unchanged. No focal consolidation. No pleural effusion or pneumothorax. Musculoskeletal: Mild degenerative changes of the thoracic spine. CT ABDOMEN PELVIS FINDINGS Hepatobiliary: Liver is within normal limits. Punctate layering gallstone (series 2/image 34), without associated inflammatory changes. No intrahepatic or extrahepatic ductal dilatation. Pancreas: Within normal limits. Spleen: Within normal limits. Adrenals/Urinary Tract: Adrenal glands are within normal limits. Kidneys are within normal limits.  No hydronephrosis. Bladder is within normal limits. Stomach/Bowel: Stomach is within normal limits. No evidence of bowel obstruction. Status post left hemicolectomy with left mid abdominal colostomy, right mid abdominal loop ileostomy, and Hartman's pouch. Vascular/Lymphatic: No evidence of abdominal aortic aneurysm.  Atherosclerotic calcifications of the abdominal aorta and branch vessels. Small upper abdominal lymph nodes, including a 10 mm short axis portacaval node (series 2/image 65), within normal limits. No suspicious abdominopelvic lymphadenopathy. Reproductive: Status post hysterectomy. Suspected bilateral salpingo oophorectomy.  No adnexal masses. Other: No abdominopelvic ascites. Although difficult to distinguish from adjacent loops of colon in the left upper abdomen, there are foci of peritoneal disease (series 2/images 64 and 71) which is similar to the prior. Patient is status post omentectomy. There is no frank omental caking. Musculoskeletal: Mild degenerative changes of the lumbar spine. IMPRESSION: Status post hysterectomy, bilateral salpingo oophorectomy, and omentectomy. Status post left hemicolectomy. Mild peritoneal disease in the left upper abdomen, difficult to distinguish from adjacent loops of colon, but grossly unchanged. No abdominopelvic ascites. No evidence of metastatic disease in the abdomen/pelvis. Electronically Signed   By: SJulian HyM.D.   On: 07/09/2020 03:31    ASSESSMENT & PLAN:   Endometrial cancer ([place] # STAGE IV-High-grade serous carcinoma endometrial] pre-treatment-CEA 125 +1500; s/p 7cycles of Carbo-Taxol; s/p debulking surgry- /p  #10  CarboTaxol. June 2022- CT scan-Status post hysterectomy, bilateral salpingo oophorectomy, and omentectomy. Status post left hemicolectomy. Mild peritoneal disease in the left upper abdomen, difficult to distinguish from adjacent loops of colon, but grossly unchanged. Noabdominopelvic ascites. No evidence of metastatic disease in the abdomen/pelvis.  #Had a long discussion with the patient/husband regarding results of the CT scan that is suggestive of still residual disease/however no evidence of obvious progression.  Given patient's poor tolerance to therapy [poor bone marrow recovery/neuropathy]-I think it is reasonable to discontinue  further chemotherapy at this time and continue surveillance.  Patient will be candidate for lenvatinib plus pembrolizumab with progression of disease.   # Low back pain- [~ 1 week]- down to right leg-likely sciatica/musculoskeletal not concerning for any malignant cause.  If not improved would recommend MRI.  For now continue on tylenol prn.   # fatgiue- depression-STABLE; on prozac to 40 mg/day    # DISPOSITION: #  Follow up in 255month MD; labs- cbc/cmp/ca-125;- Dr.B  # I reviewed the blood work- with the patient in detail; also  reviewed the imaging independently [as summarized above]; and with the patient in detail.    All questions were answered. The patient knows to call the clinic with any problems, questions or concerns.   Cammie Sickle, MD 07/13/2020 9:16 PM

## 2020-07-11 NOTE — Assessment & Plan Note (Addendum)
#   STAGE IV-High-grade serous carcinoma endometrial] pre-treatment-CEA 125 +1500; s/p 7cycles of Carbo-Taxol; s/p debulking surgry- /p  #10  CarboTaxol. June 2022- CT scan-Status post hysterectomy, bilateral salpingo oophorectomy, and omentectomy. Status post left hemicolectomy. Mild peritoneal disease in the left upper abdomen, difficult to distinguish from adjacent loops of colon, but grossly unchanged. Noabdominopelvic ascites. No evidence of metastatic disease in the abdomen/pelvis.  #Had a long discussion with the patient/husband regarding results of the CT scan that is suggestive of still residual disease/however no evidence of obvious progression.  Given patient's poor tolerance to therapy [poor bone marrow recovery/neuropathy]-I think it is reasonable to discontinue further chemotherapy at this time and continue surveillance.  Patient will be candidate for lenvatinib plus pembrolizumab with progression of disease.  # Low back pain- [~ 1 week]- down to right leg-likely sciatica/musculoskeletal not concerning for any malignant cause.  If not improved would recommend MRI.  For now continue on tylenol prn.   # fatgiue- depression-STABLE; on prozac to 40 mg/day    # DISPOSITION: #  Follow up in 20months- MD; labs- cbc/cmp/ca-125;- Dr.B  # I reviewed the blood work- with the patient in detail; also reviewed the imaging independently [as summarized above]; and with the patient in detail.

## 2020-07-12 LAB — CA 125: Cancer Antigen (CA) 125: 42.6 U/mL — ABNORMAL HIGH (ref 0.0–38.1)

## 2020-07-13 ENCOUNTER — Encounter: Payer: Self-pay | Admitting: Internal Medicine

## 2020-08-11 ENCOUNTER — Telehealth: Payer: Self-pay | Admitting: *Deleted

## 2020-08-11 NOTE — Telephone Encounter (Signed)
Pt did not feel that this was urgent, but mainly wanted Dr. Rogue Bussing to be aware of the new pain. She is concerned about "cancer coming back". She was offered an appointment to be seen in symptom management, and stated that Friday would work better for her. Appointment scheduled with Josh Borders for 8/12 at 1pm.

## 2020-08-11 NOTE — Telephone Encounter (Signed)
Patient called reporting low back pain for 4 weeks and bilateral neck/shoulder pain for 2 weeks. She denies having lifted anything to cause this and states that he back pain occasionally goes down her right leg. She states that she takes Tylenol which helps the pain and wanted Dr B to know that this is happening since he told her to report anything new going on. She also stated that she hopes this is not her cancer spreading.

## 2020-08-15 ENCOUNTER — Other Ambulatory Visit: Payer: Self-pay

## 2020-08-15 ENCOUNTER — Inpatient Hospital Stay: Payer: Medicare Other | Attending: Obstetrics and Gynecology | Admitting: Hospice and Palliative Medicine

## 2020-08-15 VITALS — BP 120/71 | HR 80 | Temp 97.9°F | Resp 17 | Wt 149.2 lb

## 2020-08-15 DIAGNOSIS — Z9079 Acquired absence of other genital organ(s): Secondary | ICD-10-CM | POA: Insufficient documentation

## 2020-08-15 DIAGNOSIS — C541 Malignant neoplasm of endometrium: Secondary | ICD-10-CM | POA: Diagnosis not present

## 2020-08-15 DIAGNOSIS — Z90722 Acquired absence of ovaries, bilateral: Secondary | ICD-10-CM | POA: Insufficient documentation

## 2020-08-15 DIAGNOSIS — Z90710 Acquired absence of both cervix and uterus: Secondary | ICD-10-CM | POA: Diagnosis not present

## 2020-08-15 DIAGNOSIS — M549 Dorsalgia, unspecified: Secondary | ICD-10-CM

## 2020-08-15 DIAGNOSIS — Z9221 Personal history of antineoplastic chemotherapy: Secondary | ICD-10-CM | POA: Diagnosis not present

## 2020-08-15 NOTE — Progress Notes (Signed)
Pt in [facility] for neck pain x2 weeks, right leg pain "for a while", and lower back pain x1 month. She is concerned about cancer recurrence. Takes tylenol for her back pain, which she states does provide relief.

## 2020-08-15 NOTE — Progress Notes (Signed)
[facility]  [phone] [phone]  Patient Care Team: Danelle Berry, NP as PCP - General (Nurse Practitioner) Clent Jacks, RN as Oncology Nurse Navigator Cammie Sickle, MD as Consulting Physician (Internal Medicine) Jeanette Ends, MD as Referring Physician (Obstetrics)   Name of the patient: Jeanette Allen  [id number]  1955/04/02   Date of visit: 08/15/20  Reason for Consult:  Ms. Jeanette Allen is a 65 year old woman with multiple medical problems including stage IV high-grade serous endometrial cancer who is status post neoadjuvant chemotherapy, debulking surgery, hysterectomy/BSO and omentectomy and a left hemicolectomy.  Most recent CT of the chest, abdomen, and pelvis on 07/08/2020 revealed mild peritoneal disease in the left upper abdomen, which was relatively unchanged in appearance.  Patient saw Dr. Rogue Bussing on 07/11/2020 and was felt to have residual disease but no evidence of progression.  Given poor tolerance to chemotherapy, plan was made for continued surveillance.  Patient did have some low back pain at time of last MD visit.  Pain was characterized as radiating down the right leg and was thought likely to be reflective of sciatica.  Tylenol was recommended.  Patient presents to [facility] today for evaluation of continued low back pain and pain to the bilateral neck/shoulders.  Pain has persisted over the past month.  Pain occasionally still radiates down the right leg.  Patient has been taking acetaminophen and finds that it completely relieves the pain.  However, the pain is occurring daily.  She is generally taking acetaminophen once or twice daily.  She denies any trauma or falls.  She has been trying to lift her grandson less often as it has exacerbated the pain.  Patient is occasionally having to wake at night to take Tylenol.  Denies any neurologic complaints. Denies recent fevers  or illnesses. Denies any easy bleeding or bruising. Reports good appetite and denies weight loss. Denies chest pain. Denies any nausea, vomiting, constipation, or diarrhea. Denies urinary complaints. Patient offers no further specific complaints today.    PAST MEDICAL HISTORY: Past Medical History:  Diagnosis Date   Arthritis    Cancer ([place])    Hypertension     PAST SURGICAL HISTORY:  Past Surgical History:  Procedure Laterality Date   APPENDECTOMY     COLONOSCOPY WITH PROPOFOL N/A 10/01/2019   Procedure: COLONOSCOPY WITH PROPOFOL;  Surgeon: Lin Landsman, MD;  Location: [facility] ENDOSCOPY;  Service: Gastroenterology;  Laterality: N/A;   ESOPHAGOGASTRODUODENOSCOPY (EGD) WITH PROPOFOL N/A 09/12/2019   Procedure: ESOPHAGOGASTRODUODENOSCOPY (EGD) WITH PROPOFOL;  Surgeon: Lin Landsman, MD;  Location: [facility] ENDOSCOPY;  Service: Gastroenterology;  Laterality: N/A;    HEMATOLOGY/ONCOLOGY HISTORY:  Oncology History Overview Note  #August 2021-ascitic fluid cytology-high-grade serous carcinoma of gynecologic origin; CT scan-behind the abdominal wall 4.6 x 2.1 mass ; within the mesentery 6.7 x 4.5 cm left of mid abdomen . BIOPSY of the peritoneal mass; high grade carcinoma ca-[phone] [Dr.Byrnett]; SEP 9th 2021- PET scan-shows peritoneal carcinomatosis; uterine uptake [Dr.Secord; unable to biopsy cervical stenosis] sigmoid colonic uptake concerning for malignancy.  No ovarian uptake.  CEA 125 +1300; CEA-2.    #Hepatitis C/ectopic pregnancy/PRBC transfusion 1995-untreated.[Dr.Vanga]  # 09/14/2019-neoadjuvant CARBO-TAXOL s/p #4 cycles-partial response; DEC 9th 2021-laparoscopy-surgery aborted given the need for multiple bowel resections; DEc 22nd, 2021-proceed with neoadjuvant carbotaxol; STARTING cycle #6- AUC-5 sec to persistent thrombocytopenia.  #Status post #7 CarboTaxol -March 2022-debulking surgery TAH/BSO; bowel resections; ileostomy/mucous fistula.  # April 13th-restart CarboTaxol  No. 8  #SEP  2021- [Dr.Vanga]Cirrhosis-child A/hepatitis C-no varices; colo-NEG for malignancy   # NGS/MOLECULAR TESTS: Omniseq-ATM* [likely somatic]; MSI-STABLE. GENETICS- NEG; HRD-    # PALLIATIVE CARE EVALUATION:  # PAIN MANAGEMENT:    DIAGNOSIS: Uterine cancer  STAGE:   IV      ;  GOALS: control  CURRENT/MOST RECENT THERAPY : Carbotaxol    Uterine cancer ([place]) (Resolved)  09/04/2019 Initial Diagnosis   Gynecologic cancer [facility])   Endometrial cancer ([place])  09/14/2019 -  Chemotherapy    Patient is on Treatment Plan: OVARIAN CARBOPLATIN (AUC 6) / PACLITAXEL (175) Q21D X 6 CYCLES       10/05/2019 Initial Diagnosis   Endometrial cancer (H[facility])    Genetic Testing   Negative germline genetic testing. No pathogenic variants identified on the [facility] panel. The report date is 10/01/2019. HRD testing was ordered but cancelled due to not enough sample/endometrial diagnosis.   The [facility] gene panel offered by [employer] includes sequencing and deletion/duplication testing of the following 35 genes: APC, ATM, AXIN2, BARD1, BMPR1A, BRCA1, BRCA2, BRIP1, CHD1, CDK4, CDKN2A, CHEK2, EPCAM (large rearrangement only), HOXB13, GALNT12, MLH1, MSH2, MSH3, MSH6, MUTYH, NBN, NTHL1, PALB2, PMS2, PTEN, RAD51C, RAD51D, RNF43, RPS20, SMAD4, STK11, and TP53. Sequencing was performed for select regions of POLE and POLD1, and large rearrangement analysis was performed for select regions of GREM1.    01/16/2020 Cancer Staging   Staging form: Corpus Uteri - Carcinoma and Carcinosarcoma, AJCC 8th Edition - Clinical: Stage IVB (cM1) - Signed by Cammie Sickle, MD on 01/16/2020     ALLERGIES:  has No Known Allergies.  MEDICATIONS:  Current Outpatient Medications  Medication Sig Dispense Refill   acetaminophen (TYLENOL) 500 MG tablet Take by mouth.     ergocalciferol (VITAMIN D2) 1.25 MG (50000 UT) capsule Take by mouth.     FLUoxetine (PROZAC) 40 MG capsule Take 1 capsule (40  mg total) by mouth daily. 30 capsule 3   omeprazole (PRILOSEC) 40 MG capsule Take 1 capsule (40 mg total) by mouth 2 (two) times daily before a meal. 60 capsule 1   ondansetron (ZOFRAN) 8 MG tablet One pill every 8 hours as needed for nausea/vomitting. (Patient not taking: Reported on 07/11/2020) 60 tablet 1   ondansetron (ZOFRAN-ODT) 4 MG disintegrating tablet Take 4 mg by mouth every 8 (eight) hours as needed for nausea or vomiting. (Patient not taking: Reported on 07/11/2020)     prochlorperazine (COMPAZINE) 10 MG tablet Take 1 tablet (10 mg total) by mouth every 6 (six) hours as needed for nausea or vomiting. (Patient not taking: Reported on 07/11/2020) 40 tablet 1   No current facility-administered medications for this visit.    VITAL SIGNS: There were no vitals taken for this visit. There were no vitals filed for this visit.  Estimated body mass index is 25.58 kg/m as calculated from the following:   Height as of 07/11/20: $RemoveBe'5\' 4"'zWEZrpFAk$  (1.626 m).   Weight as of 07/11/20: 149 lb (67.6 kg).  LABS: CBC:    Component Value Date/Time   WBC 5.7 07/11/2020 0945   HGB 11.9 (L) 07/11/2020 0945   HCT 34.4 (L) 07/11/2020 0945   PLT 119 (L) 07/11/2020 0945   MCV 107.2 (H) 07/11/2020 0945   NEUTROABS 3.9 07/11/2020 0945   LYMPHSABS 1.1 07/11/2020 0945   MONOABS 0.5 07/11/2020 0945   EOSABS 0.1 07/11/2020 0945   BASOSABS 0.0 07/11/2020 0945   Comprehensive Metabolic Panel:    Component Value Date/Time   NA 138 07/11/2020 0945  K 4.5 07/11/2020 0945   CL 110 07/11/2020 0945   CO2 22 07/11/2020 0945   BUN 14 07/11/2020 0945   CREATININE 0.87 07/11/2020 0945   GLUCOSE 96 07/11/2020 0945   CALCIUM 9.9 07/11/2020 0945   AST 45 (H) 07/11/2020 0945   ALT 59 (H) 07/11/2020 0945   ALKPHOS 81 07/11/2020 0945   BILITOT 0.8 07/11/2020 0945   PROT 7.5 07/11/2020 0945   ALBUMIN 3.9 07/11/2020 0945    RADIOGRAPHIC STUDIES: No results found.  PERFORMANCE STATUS (ECOG) : 1 - Symptomatic but completely  ambulatory  Review of Systems Unless otherwise noted, a complete review of systems is negative.  Physical Exam General: NAD Cardiovascular: regular rate and rhythm Pulmonary: clear ant fields Abdomen: soft, nontender, + bowel sounds GU: no suprapubic tenderness Extremities: no edema, no joint deformities MS: No weakness, no focal tenderness to spine Skin: no rashes Neurological: Grossly nonfocal  Assessment and Plan- Patient is a 65 y.o. female with multiple medical problems including stage IV high-grade serous endometrial cancer who is status post neoadjuvant chemotherapy, debulking surgery, hysterectomy/BSO and omentectomy and a left hemicolectomy, who presents to [facility] today for evaluation of persistent back/neck pain.   Back/neck pain -could be musculoskeletal, although patient is high risk for cancer progression off chemotherapy.  Discussed with Dr. Rogue Bussing and will obtain PET scan for surveillance.  Continue acetaminophen as needed for pain.  Patient has scheduled follow-up with Dr. Rogue Bussing on 9/9 but may need to see patient sooner depending upon results of PET scan.  Patient instructed to notify clinic with any changes or concerns.  Case and plan discussed with Dr. Rogue Bussing   Patient expressed understanding and was in agreement with this plan. She also understands that She can call clinic at any time with any questions, concerns, or complaints.   Thank you for allowing me to participate in the care of this very pleasant patient.   Time Total: 20 minutes  Visit consisted of counseling and education dealing with the complex and emotionally intense issues of symptom management and palliative care in the setting of serious and potentially life-threatening illness.Greater than 50%  of this time was spent counseling and coordinating care related to the above assessment and plan.  Signed by: Altha Harm, PhD, NP-C

## 2020-08-25 ENCOUNTER — Other Ambulatory Visit: Payer: Self-pay

## 2020-08-25 ENCOUNTER — Ambulatory Visit
Admission: RE | Admit: 2020-08-25 | Discharge: 2020-08-25 | Disposition: A | Discharge status: Home / Self Care | Payer: Medicare Other | Source: Ambulatory Visit | Attending: Hospice and Palliative Medicine | Admitting: Hospice and Palliative Medicine

## 2020-08-25 DIAGNOSIS — C541 Malignant neoplasm of endometrium: Secondary | ICD-10-CM | POA: Insufficient documentation

## 2020-08-25 DIAGNOSIS — Z933 Colostomy status: Secondary | ICD-10-CM | POA: Insufficient documentation

## 2020-08-25 DIAGNOSIS — Z9049 Acquired absence of other specified parts of digestive tract: Secondary | ICD-10-CM | POA: Diagnosis not present

## 2020-08-25 LAB — GLUCOSE, CAPILLARY: Glucose-Capillary: 106 mg/dL — ABNORMAL HIGH (ref 70–99)

## 2020-08-25 MED ORDER — FLUDEOXYGLUCOSE F - 18 (FDG) INJECTION
7.7000 | Freq: Once | INTRAVENOUS | Status: AC
  Administered 2020-08-25: 8.1 via INTRAVENOUS

## 2020-08-28 ENCOUNTER — Other Ambulatory Visit: Payer: Self-pay | Admitting: Nurse Practitioner

## 2020-08-28 DIAGNOSIS — C541 Malignant neoplasm of endometrium: Secondary | ICD-10-CM

## 2020-08-28 NOTE — Progress Notes (Signed)
Spoke to patient regarding recommendation to check ca125. Will plan for her to come to clinic tomorrow to have drawn and follow up based on results.

## 2020-08-29 ENCOUNTER — Inpatient Hospital Stay: Payer: Medicare Other

## 2020-08-29 ENCOUNTER — Other Ambulatory Visit: Payer: Self-pay

## 2020-08-29 DIAGNOSIS — C541 Malignant neoplasm of endometrium: Secondary | ICD-10-CM

## 2020-08-29 LAB — COMPREHENSIVE METABOLIC PANEL
ALT: 31 U/L (ref 0–44)
AST: 26 U/L (ref 15–41)
Albumin: 4.2 g/dL (ref 3.5–5.0)
Alkaline Phosphatase: 91 U/L (ref 38–126)
Anion gap: 6 (ref 5–15)
BUN: 15 mg/dL (ref 8–23)
CO2: 23 mmol/L (ref 22–32)
Calcium: 9.8 mg/dL (ref 8.9–10.3)
Chloride: 107 mmol/L (ref 98–111)
Creatinine, Ser: 0.95 mg/dL (ref 0.44–1.00)
GFR, Estimated: >60 mL/min (ref >60)
Glucose, Bld: 101 mg/dL — ABNORMAL HIGH (ref 70–99)
Potassium: 4.2 mmol/L (ref 3.5–5.1)
Sodium: 136 mmol/L (ref 135–145)
Total Bilirubin: 0.4 mg/dL (ref 0.3–1.2)
Total Protein: 7.8 g/dL (ref 6.5–8.1)

## 2020-08-29 LAB — CBC WITH DIFFERENTIAL/PLATELET
Abs Immature Granulocytes: 0.03 10*3/uL (ref 0.00–0.07)
Basophils Absolute: 0 10*3/uL (ref 0.0–0.1)
Basophils Relative: 1 %
Eosinophils Absolute: 0.1 10*3/uL (ref 0.0–0.5)
Eosinophils Relative: 1 %
HCT: 37.7 % (ref 36.0–46.0)
Hemoglobin: 13 g/dL (ref 12.0–15.0)
Immature Granulocytes: 1 %
Lymphocytes Relative: 25 %
Lymphs Abs: 1.1 10*3/uL (ref 0.7–4.0)
MCH: 36.4 pg — ABNORMAL HIGH (ref 26.0–34.0)
MCHC: 34.5 g/dL (ref 30.0–36.0)
MCV: 105.6 fL — ABNORMAL HIGH (ref 80.0–100.0)
Monocytes Absolute: 0.4 10*3/uL (ref 0.1–1.0)
Monocytes Relative: 9 %
Neutro Abs: 2.8 10*3/uL (ref 1.7–7.7)
Neutrophils Relative %: 63 %
Platelets: 138 10*3/uL — ABNORMAL LOW (ref 150–400)
RBC: 3.57 MIL/uL — ABNORMAL LOW (ref 3.87–5.11)
RDW: 12.1 % (ref 11.5–15.5)
WBC: 4.4 10*3/uL (ref 4.0–10.5)
nRBC: 0 % (ref 0.0–0.2)

## 2020-08-30 LAB — CA 125: Cancer Antigen (CA) 125: 50.3 U/mL — ABNORMAL HIGH (ref 0.0–38.1)

## 2020-09-12 ENCOUNTER — Inpatient Hospital Stay: Payer: Medicare Other | Attending: Obstetrics and Gynecology

## 2020-09-12 ENCOUNTER — Inpatient Hospital Stay (HOSPITAL_BASED_OUTPATIENT_CLINIC_OR_DEPARTMENT_OTHER): Payer: Medicare Other | Admitting: Internal Medicine

## 2020-09-12 VITALS — BP 133/85 | HR 74 | Temp 96.9°F | Resp 16 | Wt 153.4 lb

## 2020-09-12 DIAGNOSIS — M5441 Lumbago with sciatica, right side: Secondary | ICD-10-CM | POA: Diagnosis not present

## 2020-09-12 DIAGNOSIS — C541 Malignant neoplasm of endometrium: Secondary | ICD-10-CM | POA: Insufficient documentation

## 2020-09-12 DIAGNOSIS — R519 Headache, unspecified: Secondary | ICD-10-CM | POA: Insufficient documentation

## 2020-09-12 DIAGNOSIS — M549 Dorsalgia, unspecified: Secondary | ICD-10-CM | POA: Diagnosis not present

## 2020-09-12 DIAGNOSIS — M542 Cervicalgia: Secondary | ICD-10-CM | POA: Diagnosis not present

## 2020-09-12 DIAGNOSIS — F32A Depression, unspecified: Secondary | ICD-10-CM | POA: Insufficient documentation

## 2020-09-12 LAB — COMPREHENSIVE METABOLIC PANEL
ALT: 35 U/L (ref 0–44)
AST: 29 U/L (ref 15–41)
Albumin: 4.1 g/dL (ref 3.5–5.0)
Alkaline Phosphatase: 79 U/L (ref 38–126)
Anion gap: 6 (ref 5–15)
BUN: 22 mg/dL (ref 8–23)
CO2: 24 mmol/L (ref 22–32)
Calcium: 9.7 mg/dL (ref 8.9–10.3)
Chloride: 107 mmol/L (ref 98–111)
Creatinine, Ser: 1.06 mg/dL — ABNORMAL HIGH (ref 0.44–1.00)
GFR, Estimated: 58 mL/min — ABNORMAL LOW (ref >60)
Glucose, Bld: 99 mg/dL (ref 70–99)
Potassium: 4.5 mmol/L (ref 3.5–5.1)
Sodium: 137 mmol/L (ref 135–145)
Total Bilirubin: 0.5 mg/dL (ref 0.3–1.2)
Total Protein: 7.8 g/dL (ref 6.5–8.1)

## 2020-09-12 LAB — CBC WITH DIFFERENTIAL/PLATELET
Abs Immature Granulocytes: 0.02 10*3/uL (ref 0.00–0.07)
Basophils Absolute: 0 10*3/uL (ref 0.0–0.1)
Basophils Relative: 0 %
Eosinophils Absolute: 0.1 10*3/uL (ref 0.0–0.5)
Eosinophils Relative: 2 %
HCT: 37.8 % (ref 36.0–46.0)
Hemoglobin: 12.9 g/dL (ref 12.0–15.0)
Immature Granulocytes: 0 %
Lymphocytes Relative: 20 %
Lymphs Abs: 1 10*3/uL (ref 0.7–4.0)
MCH: 35.9 pg — ABNORMAL HIGH (ref 26.0–34.0)
MCHC: 34.1 g/dL (ref 30.0–36.0)
MCV: 105.3 fL — ABNORMAL HIGH (ref 80.0–100.0)
Monocytes Absolute: 0.5 10*3/uL (ref 0.1–1.0)
Monocytes Relative: 9 %
Neutro Abs: 3.3 10*3/uL (ref 1.7–7.7)
Neutrophils Relative %: 69 %
Platelets: 134 10*3/uL — ABNORMAL LOW (ref 150–400)
RBC: 3.59 MIL/uL — ABNORMAL LOW (ref 3.87–5.11)
RDW: 12.2 % (ref 11.5–15.5)
WBC: 4.9 10*3/uL (ref 4.0–10.5)
nRBC: 0 % (ref 0.0–0.2)

## 2020-09-12 NOTE — Progress Notes (Signed)
Pt c/o lower back pain and headaches that are responsive to Tylenol. No other complaints or concerns at this time.

## 2020-09-12 NOTE — Patient Instructions (Signed)
#  Take Claritin-D as directed for 7 days.  #Recommend take Tylenol/if needed Motrin [along with Tylenol] every 6-8 hours-for the back pain.  We will try to get the MRI ASAP for further evaluation of  your lower back pain

## 2020-09-12 NOTE — Assessment & Plan Note (Addendum)
#   STAGE IV-High-grade serous carcinoma endometrial] pre-treatment-CEA 125 +1500   #10  CarboTaxol.-Currently on surveillance.  August 2022-PET scan no obvious evidence of any progression noted; minimal residual nodularity in the left upper quadrant without any clear metabolic uptake.  No new peritoneal disease noted.  However, Ca125-slightly elevated at 40.   #I reviewed with the patient and husband that unfortunately imaging has always underestimated her disease.  At this time it is unclear if her current symptoms [see below] are related to her underlying disease.  However if work-up concerning for recurrent malignancy causing the symptoms therapy would be recommended.  # Lower back ache- chronic-currently significantly worse radiating to the right leg [for last 6 week; currently pain every day-minimal temporary improvement with Tylenol]; August 2022 PET-no bone lesions.  Recommend MRI of the lumbar spine ASAP.  # Headaches/ sinuses- [2 weeks]/neck pain-again no evidence of sinusitis noted on the PET scan.  Recommend Claritin-D.  If not improved call us.  # fatgiue- depression-STABLE; on prozac to 40 mg/day   # DISPOSITION: will call MRI #  Follow up in 1 month- MD; labs- cbc/cmp/ca-125;- Dr.B -------------------------------------------------------- #Take Claritin-D as directed for 7 days.  Call us if headaches not better after 1 week.  #Recommend take Tylenol/if needed Motrin [along with Tylenol] every 6-8 hours-for the back pain.  We will try to get the MRI ASAP for further evaluation of  your lower back pain

## 2020-09-12 NOTE — Progress Notes (Signed)
[facility] NOTE  Patient Care Team: Danelle Berry, NP as PCP - General (Nurse Practitioner) Clent Jacks, RN as Oncology Nurse Navigator Cammie Sickle, MD as Consulting Physician (Internal Medicine) Gillis Ends, MD as Referring Physician (Obstetrics)  CHIEF COMPLAINTS/PURPOSE OF CONSULTATION: high grade serous cancer   #  Oncology History Overview Note  #August 2021-ascitic fluid cytology-high-grade serous carcinoma of gynecologic origin; CT scan-behind the abdominal wall 4.6 x 2.1 mass ; within the mesentery 6.7 x 4.5 cm left of mid abdomen . BIOPSY of the peritoneal mass; high grade carcinoma ca-[phone] [Dr.Byrnett]; SEP 9th 2021- PET scan-shows peritoneal carcinomatosis; uterine uptake [Dr.Secord; unable to biopsy cervical stenosis] sigmoid colonic uptake concerning for malignancy.  No ovarian uptake.  CEA 125 +1300; CEA-2.    #Hepatitis C/ectopic pregnancy/PRBC transfusion 1995-untreated.[Dr.Vanga]  # 09/14/2019-neoadjuvant CARBO-TAXOL s/p #4 cycles-partial response; DEC 9th 2021-laparoscopy-surgery aborted given the need for multiple bowel resections; DEc 22nd, 2021-proceed with neoadjuvant carbotaxol; STARTING cycle #6- AUC-5 sec to persistent thrombocytopenia.  #Status post #7 CarboTaxol -March 2022-debulking surgery TAH/BSO; bowel resections; ileostomy/mucous fistula.  # April 13th-restart CarboTaxol No. 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 GENETICS- NEG; HRD-    # PALLIATIVE CARE EVALUATION:  # PAIN MANAGEMENT:    DIAGNOSIS: Uterine cancer  STAGE:   IV      ;  GOALS: control  CURRENT/MOST RECENT THERAPY : Carbotaxol    Uterine cancer ([place]) (Resolved)  09/04/2019 Initial Diagnosis   Gynecologic cancer [facility])   Endometrial cancer ([place])  09/14/2019 -  Chemotherapy    Patient is on Treatment Plan:  OVARIAN CARBOPLATIN (AUC 6) / PACLITAXEL (175) Q21D X 6 CYCLES       10/05/2019 Initial Diagnosis   Endometrial cancer ([facility])    Genetic Testing   Negative germline genetic testing. No pathogenic variants identified on the [facility] panel. The report date is 10/01/2019. HRD testing was ordered but cancelled due to not enough sample/endometrial diagnosis.   The [facility] gene panel offered by [employer] includes sequencing and deletion/duplication testing of the following 35 genes: APC, ATM, AXIN2, BARD1, BMPR1A, BRCA1, BRCA2, BRIP1, CHD1, CDK4, CDKN2A, CHEK2, EPCAM (large rearrangement only), HOXB13, GALNT12, MLH1, MSH2, MSH3, MSH6, MUTYH, NBN, NTHL1, PALB2, PMS2, PTEN, RAD51C, RAD51D, RNF43, RPS20, SMAD4, STK11, and TP53. Sequencing was performed for select regions of POLE and POLD1, and large rearrangement analysis was performed for select regions of GREM1.    01/16/2020 Cancer Staging   Staging form: Corpus Uteri - Carcinoma and Carcinosarcoma, AJCC 8th Edition - Clinical: Stage IVB (cM1) - Signed by Cammie Sickle, MD on 01/16/2020      HISTORY OF PRESENTING ILLNESS:  Jeanette Allen 65 y.o.  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  In the interim patient was evaluated by-symptom management clinic for ongoing low back pain.   Patient is currently taking Tylenol with temporary improvement.  However pain is worse radiating right lower extremity.  Not worse with movement.  No falls no trauma.  Also complains of sinus pressure bilateral.  Complains of frontal headache.  Denies any sinus drainage/nasal drainage.  No vision changes.  Review of Systems  Constitutional:  Positive for malaise/fatigue. Negative for chills, diaphoresis, fever and weight loss.  HENT:  Negative for nosebleeds and sore throat.   Eyes:  Negative  for double vision.   Respiratory:  Negative for cough, hemoptysis, sputum production, shortness of breath and wheezing.   Cardiovascular:  Negative for chest pain, palpitations, orthopnea and leg swelling.  Gastrointestinal:  Positive for constipation. Negative for blood in stool, diarrhea, heartburn, melena and vomiting.  Genitourinary:  Negative for dysuria, frequency and urgency.  Musculoskeletal:  Positive for myalgias. Negative for back pain and joint pain.  Skin: Negative.  Negative for itching and rash.  Neurological:  Negative for dizziness, tingling, focal weakness, weakness and headaches.  Endo/Heme/Allergies:  Does not bruise/bleed easily.  Psychiatric/Behavioral:  Negative for depression. The patient is not nervous/anxious and does not have insomnia.     MEDICAL HISTORY:  Past Medical History:  Diagnosis Date   Arthritis    Cancer ([place])    Hypertension     SURGICAL HISTORY: Past Surgical History:  Procedure Laterality Date   APPENDECTOMY     COLONOSCOPY WITH PROPOFOL N/A 10/01/2019   Procedure: COLONOSCOPY WITH PROPOFOL;  Surgeon: Lin Landsman, MD;  Location: [facility] ENDOSCOPY;  Service: Gastroenterology;  Laterality: N/A;   ESOPHAGOGASTRODUODENOSCOPY (EGD) WITH PROPOFOL N/A 09/12/2019   Procedure: ESOPHAGOGASTRODUODENOSCOPY (EGD) WITH PROPOFOL;  Surgeon: Lin Landsman, MD;  Location: [facility] ENDOSCOPY;  Service: Gastroenterology;  Laterality: N/A;    SOCIAL HISTORY: Social History   Socioeconomic History   Marital status: Married    Spouse name: Not on file   Number of children: Not on file   Years of education: Not on file   Highest education level: Not on file  Occupational History   Not on file  Tobacco Use   Smoking status: Every Day   Smokeless tobacco: Never  Vaping Use   Vaping Use: Never used  Substance and Sexual Activity   Alcohol use: Not Currently   Drug use: Never   Sexual activity: Yes  Other Topics Concern   Not on file  Social History Narrative   Lives  with husband; near [place]. Worked Network engineer at [employer]; smoke 1/2 ppd/slowed; no alcohol; 3 children [2 boys; one girl- [employer]]   Social Determinants of Health   Financial Resource Strain: Not on file  Food Insecurity: Not on file  Transportation Needs: Not on file  Physical Activity: Not on file  Stress: Not on file  Social Connections: Not on file  Intimate Partner Violence: Not on file    FAMILY HISTORY: Family History  Problem Relation Age of Onset   Diabetes Maternal Grandmother    Hypertension Maternal Grandmother    Stroke Maternal Grandmother     ALLERGIES:  has No Known Allergies.  MEDICATIONS:  Current Outpatient Medications  Medication Sig Dispense Refill   acetaminophen (TYLENOL) 500 MG tablet Take by mouth.     ergocalciferol (VITAMIN D2) 1.25 MG (50000 UT) capsule Take by mouth.     FLUoxetine (PROZAC) 40 MG capsule Take 1 capsule (40 mg total) by mouth daily. 30 capsule 3   omeprazole (PRILOSEC) 40 MG capsule Take 1 capsule (40 mg total) by mouth 2 (two) times daily before a meal. 60 capsule 1   ondansetron (ZOFRAN) 8 MG tablet One pill every 8 hours as needed for nausea/vomitting. (Patient not taking: Reported on 09/12/2020) 60 tablet 1   ondansetron (ZOFRAN-ODT) 4 MG disintegrating tablet Take 4 mg by mouth every 8 (eight) hours as needed for nausea or vomiting. (Patient not taking: No sig reported)     prochlorperazine (COMPAZINE) 10 MG tablet Take 1 tablet (10 mg total) by mouth every 6 (  six) hours as needed for nausea or vomiting. (Patient not taking: No sig reported) 40 tablet 1   No current facility-administered medications for this visit.      Marland Kitchen  PHYSICAL EXAMINATION: ECOG PERFORMANCE STATUS: 1 - Symptomatic but completely ambulatory  Vitals:   09/12/20 1033  BP: 133/85  Pulse: 74  Resp: 16  Temp: (!) 96.9 F (36.1 C)  SpO2: 97%   Filed Weights   09/12/20 1033  Weight: 153 lb 6.4 oz (69.6 kg)    Physical Exam Constitutional:       Comments: Ambulating independently.  Accompanied by husband.  HENT:     Head: Normocephalic and atraumatic.     Mouth/Throat:     Pharynx: No oropharyngeal exudate.  Eyes:     Pupils: Pupils are equal, round, and reactive to light.  Cardiovascular:     Rate and Rhythm: Normal rate and regular rhythm.  Pulmonary:     Effort: Pulmonary effort is normal. No respiratory distress.     Breath sounds: Normal breath sounds. No wheezing.  Abdominal:     General: Bowel sounds are normal. There is distension.     Palpations: Abdomen is soft. There is no mass.     Tenderness: There is no abdominal tenderness. There is no guarding or rebound.  Musculoskeletal:        General: No tenderness. Normal range of motion.     Cervical back: Normal range of motion and neck supple.  Skin:    General: Skin is warm.  Neurological:     Mental Status: She is alert and oriented to person, place, and time.  Psychiatric:        Mood and Affect: Affect normal.     LABORATORY DATA:  I have reviewed the data as listed Lab Results  Component Value Date   WBC 4.9 09/12/2020   HGB 12.9 09/12/2020   HCT 37.8 09/12/2020   MCV 105.3 (H) 09/12/2020   PLT 134 (L) 09/12/2020   Recent Labs    09/14/19 0813 09/28/19 1006 10/05/19 0807 10/15/19 1107 07/11/20 0945 08/29/20 0958 09/12/20 0959  NA 139 138 140   < > 138 136 137  K 4.0 4.0 4.3   < > 4.5 4.2 4.5  CL 104 104 104   < > 110 107 107  CO2 '24 26 27   ' < > '22 23 24  ' GLUCOSE 104* 106* 108*   < > 96 101* 99  BUN '12 14 13   ' < > '14 15 22  ' CREATININE 0.93 0.73 0.86   < > 0.87 0.95 1.06*  CALCIUM 9.2 9.0 9.9   < > 9.9 9.8 9.7  GFRNONAA >60 >60 >60   < > >60 >60 58*  GFRAA >60 >60 >60  --   --   --   --   PROT 7.5  --  7.6   < > 7.5 7.8 7.8  ALBUMIN 3.5  --  4.0   < > 3.9 4.2 4.1  AST 30  --  37   < > 45* 26 29  ALT 21  --  30   < > 59* 31 35  ALKPHOS 71  --  72   < > 81 91 79  BILITOT 0.5  --  0.5   < > 0.8 0.4 0.5   < > = values in this  interval not displayed.    RADIOGRAPHIC STUDIES: I have personally reviewed the radiological images as listed and agreed with the  findings in the report. NM PET Image Restag (PS) Skull Base To Thigh  Result Date: 08/25/2020 CLINICAL DATA:  Sub treatment strategy for uterine cervical surveillance. Back pain. High risk disease for progression. Status post total hysterectomy. Colostomy. EXAM: NUCLEAR MEDICINE PET SKULL BASE TO THIGH TECHNIQUE: 8.1 mCi F-18 FDG was injected intravenously. Full-ring PET imaging was performed from the skull base to thigh after the radiotracer. CT data was obtained and used for attenuation correction and anatomic localization. Fasting blood glucose: 106 mg/dl COMPARISON:  07/08/2020 FINDINGS: Mediastinal blood pool activity: SUV max 1.6 Liver activity: SUV max NA NECK: No hypermetabolic lymph nodes in the neck. Incidental CT findings: none CHEST: No hypermetabolic mediastinal or hilar nodes. No suspicious pulmonary nodules on the CT scan. Incidental CT findings: none ABDOMEN/PELVIS: There is mild nodularity in LEFT upper quadrant at site of prior peritoneal disease. No clear metabolic activity associated with nodularity above background adjacent bowel activity (image 136) No abnormal activity within liver along the liver capsule. No new peritoneal disease. No pelvic or abdominal lymphadenopathy. Incidental CT findings: Loop ileostomy and LEFT lower quadrant colostomy noted. Hartmann pouch noted. Post hysterectomy no nodularity in the deep pelvis. SKELETON: No aggressive osseous lesion.  No hypermetabolic lesion. Incidental CT findings: none IMPRESSION: 1. No evidence of carcinoma progression. No evidence of local recurrence in the pelvis. 2. Minimal residual nodularity in the LEFT upper quadrant without clear metabolic activity. 3. No new peritoneal disease, visceral disease, or skeletal metastasis. 4. Post partial colectomy without complication. Electronically Signed   By: Suzy Bouchard M.D.   On: 08/25/2020 22:06    ASSESSMENT & PLAN:   Endometrial cancer ([place]) # STAGE IV-High-grade serous carcinoma endometrial] pre-treatment-CEA 125 +1500   #10  CarboTaxol.-Currently on surveillance.  August 2022-PET scan no obvious evidence of any progression noted; minimal residual nodularity in the left upper quadrant without any clear metabolic uptake.  No new peritoneal disease noted.  However, Ca125-slightly elevated at 40.   #I reviewed with the patient and husband that unfortunately imaging has always underestimated her disease.  At this time it is unclear if her current symptoms [see below] are related to her underlying disease.  However if work-up concerning for recurrent malignancy causing the symptoms therapy would be recommended.  # Lower back ache- chronic-currently significantly worse radiating to the right leg [for last 6 week; currently pain every day-minimal temporary improvement with Tylenol]; August 2022 PET-no bone lesions.  Recommend MRI of the lumbar spine ASAP.   # Headaches/ sinuses- [2 weeks]/neck pain-again no evidence of sinusitis noted on the PET scan.  Recommend Claritin-D.  If not improved call us.  # fatgiue- depression-STABLE; on prozac to 40 mg/day   # DISPOSITION: will call MRI #  Follow up in 1 month- MD; labs- cbc/cmp/ca-125;- Dr.B -------------------------------------------------------- #Take Claritin-D as directed for 7 days.  Call us if headaches not better after 1 week.  #Recommend take Tylenol/if needed Motrin [along with Tylenol] every 6-8 hours-for the back pain.  We will try to get the MRI ASAP for further evaluation of  your lower back pain    All questions were answered. The patient knows to call the clinic with any problems, questions or concerns.   Cammie Sickle, MD 09/12/2020 7:52 PM

## 2020-09-13 ENCOUNTER — Other Ambulatory Visit: Payer: Self-pay | Admitting: Internal Medicine

## 2020-09-13 LAB — CA 125: Cancer Antigen (CA) 125: 80.5 U/mL — ABNORMAL HIGH (ref 0.0–38.1)

## 2020-09-17 ENCOUNTER — Telehealth: Payer: Self-pay | Admitting: *Deleted

## 2020-09-17 DIAGNOSIS — K219 Gastro-esophageal reflux disease without esophagitis: Secondary | ICD-10-CM

## 2020-09-17 MED ORDER — OMEPRAZOLE 40 MG PO CPDR: 40.0000 mg | DELAYED_RELEASE_CAPSULE | Freq: Two times a day (BID) | ORAL | 1 refills | Status: DC

## 2020-09-17 NOTE — Telephone Encounter (Signed)
09/17/2020 Spoke w/ pt and informed her that MRI has been scheduled for 9/26 @ 9:30. Pt confirmed appt  SRW

## 2020-09-17 NOTE — Telephone Encounter (Signed)
RF for omeprazole submitted to pharmay

## 2020-09-17 NOTE — Telephone Encounter (Signed)
Patient is inquiring about the MRI she is supposed to have scheduled. Please advise. I see order for MRI, but not scheduled. She is also asking about the refill for her Omeprazole sent in a separate encounter

## 2020-09-17 NOTE — Addendum Note (Signed)
Addended by: Gloris Ham on: 09/17/2020 02:24 PM   Modules accepted: Orders

## 2020-09-17 NOTE — Telephone Encounter (Signed)
Colletta [place]- please check on the status for the MRI

## 2020-09-23 ENCOUNTER — Encounter: Payer: Self-pay | Admitting: Internal Medicine

## 2020-09-27 ENCOUNTER — Encounter: Payer: Self-pay | Admitting: Internal Medicine

## 2020-09-27 NOTE — Progress Notes (Signed)
Spoke to patient that her tumor marker has come back elevated at 80. Await MRI of the spine pending. Will discuss with Gynecology - onc. Patient will need to follow up sooner to discuss this further.

## 2020-09-29 ENCOUNTER — Ambulatory Visit
Admission: RE | Admit: 2020-09-29 | Discharge: 2020-09-29 | Disposition: A | Discharge status: Home / Self Care | Payer: Medicare Other | Source: Ambulatory Visit | Attending: Internal Medicine | Admitting: Internal Medicine

## 2020-09-29 ENCOUNTER — Other Ambulatory Visit: Payer: Self-pay

## 2020-09-29 ENCOUNTER — Encounter: Payer: Self-pay | Admitting: Internal Medicine

## 2020-09-29 ENCOUNTER — Telehealth: Payer: Self-pay | Admitting: *Deleted

## 2020-09-29 DIAGNOSIS — M5441 Lumbago with sciatica, right side: Secondary | ICD-10-CM | POA: Diagnosis present

## 2020-09-29 DIAGNOSIS — C541 Malignant neoplasm of endometrium: Secondary | ICD-10-CM | POA: Insufficient documentation

## 2020-09-29 MED ORDER — GADOBUTROL 1 MMOL/ML IV SOLN
6.0000 mL | Freq: Once | INTRAVENOUS | Status: AC | PRN
  Administered 2020-09-29: 6 mL via INTRAVENOUS

## 2020-09-29 NOTE — Telephone Encounter (Signed)
Contacted patient. Changed dates/ times of future apts per v/o Dr. Rogue Bussing. Jeanette Allen instructed to come this Friday 9/30 at 830 am to see Dr. B (per md  no labs)  I changed the lab date to 10/3 when Jeanette Allen comes to see Dr. Theora Gianotti.

## 2020-09-29 NOTE — Progress Notes (Signed)
Spoke to patient regarding the [facility] of [facility]. Patient is interested.  Still awaiting MRI of the spine that's ordered/done today.   GB

## 2020-10-02 ENCOUNTER — Telehealth: Payer: Self-pay | Admitting: Pharmacy Technician

## 2020-10-02 ENCOUNTER — Encounter: Payer: Self-pay | Admitting: Internal Medicine

## 2020-10-02 ENCOUNTER — Inpatient Hospital Stay (HOSPITAL_BASED_OUTPATIENT_CLINIC_OR_DEPARTMENT_OTHER): Payer: Medicare Other | Admitting: Internal Medicine

## 2020-10-02 ENCOUNTER — Other Ambulatory Visit (HOSPITAL_COMMUNITY): Payer: Self-pay

## 2020-10-02 ENCOUNTER — Other Ambulatory Visit: Payer: Self-pay

## 2020-10-02 ENCOUNTER — Inpatient Hospital Stay: Payer: Medicare Other | Admitting: Pharmacist

## 2020-10-02 VITALS — BP 124/81 | HR 82 | Temp 97.0°F | Resp 16 | Wt 151.6 lb

## 2020-10-02 DIAGNOSIS — C541 Malignant neoplasm of endometrium: Secondary | ICD-10-CM | POA: Diagnosis not present

## 2020-10-02 DIAGNOSIS — R5383 Other fatigue: Secondary | ICD-10-CM | POA: Diagnosis not present

## 2020-10-02 NOTE — Assessment & Plan Note (Addendum)
#   STAGE IV-High-grade serous carcinoma endometrial] pre-treatment-CEA 125 +1500   #10  CarboTaxol.-Currently on surveillance.  August 2022-PET scan no obvious evidence of any progression noted; minimal residual nodularity in the left upper quadrant without any clear metabolic uptake.  No new peritoneal disease noted.  However, Ca125-slightly elevated at 80.   #I reviewed with the patient and husband that unfortunately imaging has always underestimated her disease-and given the rising tumor marker I would recommend proceeding with systemic therapy.   #I discussed option of systemic therapy would include-CarboTaxol; however patient received CarboTaxol last about 4 months ago.  Given suboptimal response to CarboTaxol-also discussed option of using lenvatinib plus Keytruda.   I discussed the mechanism of action; The goal of therapy is palliative; and length of treatments are likely ongoing/based upon the results of the scans. Discussed the potential side effects of immunotherapy including but not limited to diarrhea; skin rash; elevated LFTs/endocrine abnormalities etc.  # Discussed the use of  lenvatinib- discussed the potential side effects including but not limited to oral ulcers diarrhea and hand-foot syndrome; hypertension fatigue weight loss.  Recommend starting at the standard dose of 20 mg once a day.  Discussed with pharmacy.  # Lower back ache- chronic MRI lumbar spine negative for any acute process possible arthritis.  Continue symptomatic management.  # fatgiue- depression-STABLE; on prozac to 40 mg/day   I spoke at length with the patient's daughter on the phone- regarding the patient's clinical status/plan of care.  Family agreement.   # DISPOSITION:  #  Follow up in week of 10th Oct- MD; labs- cbc/cmp/TSH; Beryle Flock [new]-Dr.B  Addendum: Discussed with Dr. Theora Gianotti; agrees with the plan for lenvatinib plus Keytruda.   # 40 minutes face-to-face with the patient discussing the above plan  of care; more than 50% of time spent on prognosis/ natural history; counseling and coordination.  On 10/03-patient was evaluated in the clinic/symptom management-noted to have a small bowel obstruction.  Discussed with Lauren Allen/gynecology oncology [facility]-recommend evaluation/admission at [facility] in case patient needs surgical evaluation.  Discussed with patient and husband-in agreement.

## 2020-10-02 NOTE — Progress Notes (Signed)
[place]  [phone] [phone]  Patient Care Team: Danelle Berry, NP as PCP - General (Nurse Practitioner) Clent Jacks, RN as Oncology Nurse Navigator Cammie Sickle, MD as Consulting Physician (Internal Medicine) Gillis Ends, MD as Referring Physician (Obstetrics)   Name of the patient: Jeanette Allen  [id number]  1955/05/05   Date of visit: 10/02/20  HPI: Patient is a 65 y.o. 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 Planned treatment with Lenvima (lenvatinib) and Keytruda (pembrolizumab)   Reason for Consult: Lenvima (lenvatinib) and Keytruda (pembrolizumab) chemotherapy education.   PAST MEDICAL HISTORY: Past Medical History:  Diagnosis Date   Arthritis    Cancer [facility])    Hypertension     HEMATOLOGY/ONCOLOGY HISTORY:  Oncology History Overview Note  #August 2021-ascitic fluid cytology-high-grade serous carcinoma of gynecologic origin; CT scan-behind the abdominal wall 4.6 x 2.1 mass ; within the mesentery 6.7 x 4.5 cm left of mid abdomen . BIOPSY of the peritoneal mass; high grade carcinoma [phone] [Dr.Byrnett]; SEP 9th 2021- PET scan-shows peritoneal carcinomatosis; uterine uptake [Dr.Secord; unable to biopsy cervical stenosis] sigmoid colonic uptake concerning for malignancy.  No ovarian uptake.  CEA 125 +1300; CEA-2.    #Hepatitis C/ectopic pregnancy/PRBC transfusion 1995-untreated.[Dr.Vanga]  # 09/14/2019-neoadjuvant CARBO-TAXOL s/p #4 cycles-partial response; DEC 9th 2021-laparoscopy-surgery aborted given the need for multiple bowel resections; DEc 22nd, 2021-proceed with neoadjuvant carbotaxol; STARTING cycle #6- AUC-5 sec to persistent thrombocytopenia.  #Status post #7 CarboTaxol -March 2022-debulking surgery TAH/BSO; bowel resections; ileostomy/mucous fistula.  # April 13th-restart CarboTaxol No. 8  #SEP 2021-  [Dr.Vanga]Cirrhosis-child A/hepatitis C-no varices; colo-NEG for malignancy   # NGS/MOLECULAR TESTS: Omniseq-ATM* [likely somatic]; MSI-STABLE. GENETICS- NEG; HRD-    # PALLIATIVE CARE EVALUATION:  # PAIN MANAGEMENT:    DIAGNOSIS: Uterine cancer  STAGE:   IV      ;  GOALS: control  CURRENT/MOST RECENT THERAPY : Carbotaxol    Uterine cancer ([place]) (Resolved)  09/04/2019 Initial Diagnosis   Gynecologic cancer [facility])   Endometrial cancer ([place])  09/14/2019 -  Chemotherapy    Patient is on Treatment Plan: OVARIAN CARBOPLATIN (AUC 6) / PACLITAXEL (175) Q21D X 6 CYCLES       10/05/2019 Initial Diagnosis   Endometrial cancer ([facility])    Genetic Testing   Negative germline genetic testing. No pathogenic variants identified on the [facility] panel. The report date is 10/01/2019. HRD testing was ordered but cancelled due to not enough sample/endometrial diagnosis.   The [facility] gene panel offered by [employer] includes sequencing and deletion/duplication testing of the following 35 genes: APC, ATM, AXIN2, BARD1, BMPR1A, BRCA1, BRCA2, BRIP1, CHD1, CDK4, CDKN2A, CHEK2, EPCAM (large rearrangement only), HOXB13, GALNT12, MLH1, MSH2, MSH3, MSH6, MUTYH, NBN, NTHL1, PALB2, PMS2, PTEN, RAD51C, RAD51D, RNF43, RPS20, SMAD4, STK11, and TP53. Sequencing was performed for select regions of POLE and POLD1, and large rearrangement analysis was performed for select regions of GREM1.    01/16/2020 Cancer Staging   Staging form: Corpus Uteri - Carcinoma and Carcinosarcoma, AJCC 8th Edition - Clinical: Stage IVB (cM1) - Signed by Cammie Sickle, MD on 01/16/2020     ALLERGIES:  has No Known Allergies.  MEDICATIONS:  Current Outpatient Medications  Medication Sig Dispense Refill   acetaminophen (TYLENOL) 500 MG tablet Take by mouth.     ergocalciferol (VITAMIN D2) 1.25 MG (50000 UT) capsule Take by mouth.     FLUoxetine (PROZAC) 40 MG capsule Take 1 capsule (40 mg total)  by mouth  daily. 30 capsule 3   omeprazole (PRILOSEC) 40 MG capsule Take 1 capsule (40 mg total) by mouth 2 (two) times daily before a meal. 60 capsule 1   ondansetron (ZOFRAN) 8 MG tablet One pill every 8 hours as needed for nausea/vomitting. 60 tablet 1   ondansetron (ZOFRAN-ODT) 4 MG disintegrating tablet Take 4 mg by mouth every 8 (eight) hours as needed for nausea or vomiting.     prochlorperazine (COMPAZINE) 10 MG tablet Take 1 tablet (10 mg total) by mouth every 6 (six) hours as needed for nausea or vomiting. 40 tablet 1   No current facility-administered medications for this visit.    VITAL SIGNS: There were no vitals taken for this visit. There were no vitals filed for this visit.  Estimated body mass index is 26.02 kg/m as calculated from the following:   Height as of 07/11/20: _0  (1.626 m).   Weight as of an earlier encounter on 10/02/20: 68.8 kg (151 lb 9.6 oz).  LABS: CBC:    Component Value Date/Time   WBC 4.9 09/12/2020 0959   HGB 12.9 09/12/2020 0959   HCT 37.8 09/12/2020 0959   PLT 134 (L) 09/12/2020 0959   MCV 105.3 (H) 09/12/2020 0959   NEUTROABS 3.3 09/12/2020 0959   LYMPHSABS 1.0 09/12/2020 0959   MONOABS 0.5 09/12/2020 0959   EOSABS 0.1 09/12/2020 0959   BASOSABS 0.0 09/12/2020 0959   Comprehensive Metabolic Panel:    Component Value Date/Time   NA 137 09/12/2020 0959   K 4.5 09/12/2020 0959   CL 107 09/12/2020 0959   CO2 24 09/12/2020 0959   BUN 22 09/12/2020 0959   CREATININE 1.06 (H) 09/12/2020 0959   GLUCOSE 99 09/12/2020 0959   CALCIUM 9.7 09/12/2020 0959   AST 29 09/12/2020 0959   ALT 35 09/12/2020 0959   ALKPHOS 79 09/12/2020 0959   BILITOT 0.5 09/12/2020 0959   PROT 7.8 09/12/2020 0959   ALBUMIN 4.1 09/12/2020 0959     Present during today's visit: patient only  Assessment and Plan: Start plan: lenvatinib and pembrolizumab will be started 10/12  Planned duration until disease progression or unacceptable drug toxicity.  CMP/CBC from 09/12/20  assessed, no relevant lab abnormalities. BP today well controlled, continue to monitor. TSH lab order entered.  Prescription dose and frequency assessed.   Current medication list in Epic reviewed, a few DDIs with lenvatinib identified: Concurrent use of lenvatinib and QT prolonging medication like fluoxetine and ondansetron may result in increased risk of QT-interval prolongation. Recommend checking ECG at baseline and again 2-3 weeks into lenvatinib treatment.   Patient Education I spoke with patient for overview of new chemotherapy: Lenvima (lenvatinib) and Keytruda (pembrolizumab)    Administration: Counseled patient on administration, dosing, side effects, monitoring, drug-food interactions, safe handling, storage, and disposal.  Side Effects: Side effects include but not limited to:  Lenvatinib: Hand-foot syndrome, diarrhea, N/V, fatigue, mouth sores, increased BP (HTN) Pembrolizumab: diarrhea, rash, changes in thyroid function   Diarrhea: discussed with patient that depending on which medication is causing the diarrhea, that determines the best management for the diarrhea. She should call the office if diarrhea occurs, she can start loperamide but she still needs to call the office. Hand-foot syndrome: provided patient with a bottle of [place] Smooth Extra Care 20, recommended she keep her hands moisturized  HTN: Suggested patient monitor her BP at home with treatment initiation Mouth sores: pt instructed to call the office to obtain magic mouthwash if needed  N/V: pt reported having antiemetic medication at home to use as needed.  Adherence: After discussion with patient no patient barriers to medication adherence identified.  Reviewed with patient importance of keeping a medication schedule and plan for any missed doses.  Ms. Waldren voiced understanding and appreciation. All questions answered. Medication handouts for lenvatinib and pembrolizumab provided.  Provided patient with  [facility] phone number. Patient knows to call the office with questions or concerns. [facility] will continue to follow.  Patient expressed understanding and was in agreement with this plan. She also understands that She can call clinic at any time with any questions, concerns, or complaints.   Medication Access Issues: Patient will need to be signed up for manufacturer assistance for the lenvatinib  Follow-up plan: RTC for treatment start on 10/12  Thank you for allowing me to participate in the care of this patient.   Time Total: 30 mins  Visit consisted of counseling and education on dealing with issues of symptom management in the setting of serious and potentially life-threatening illness.Greater than 50%  of this time was spent counseling and coordinating care related to the above assessment and plan.  Signed by: Darl Pikes, PharmD, BCPS, Salley Slaughter, CPP Hematology/Oncology Clinical Pharmacist Practitioner [facility] [phone]  10/02/2020 4:38 PM

## 2020-10-02 NOTE — Progress Notes (Signed)
Patient here for results no complaints today. 

## 2020-10-02 NOTE — Progress Notes (Signed)
[facility] NOTE  Patient Care Team: Danelle Berry, NP as PCP - General (Nurse Practitioner) Clent Jacks, RN as Oncology Nurse Navigator Cammie Sickle, MD as Consulting Physician (Internal Medicine) Gillis Ends, MD as Referring Physician (Obstetrics)  CHIEF COMPLAINTS/PURPOSE OF CONSULTATION: high grade serous cancer   #  Oncology History Overview Note  #August 2021-ascitic fluid cytology-high-grade serous carcinoma of gynecologic origin; CT scan-behind the abdominal wall 4.6 x 2.1 mass ; within the mesentery 6.7 x 4.5 cm left of mid abdomen . BIOPSY of the peritoneal mass; high grade carcinoma ca-[id number] [Dr.Byrnett]; SEP 9th 2021- PET scan-shows peritoneal carcinomatosis; uterine uptake [Dr.Secord; unable to biopsy cervical stenosis] sigmoid colonic uptake concerning for malignancy.  No ovarian uptake.  CEA 125 +1300; CEA-2.    #Hepatitis C/ectopic pregnancy/PRBC transfusion 1995-untreated.[Dr.Vanga]  # 09/14/2019-neoadjuvant CARBO-TAXOL s/p #4 cycles-partial response; DEC 9th 2021-laparoscopy-surgery aborted given the need for multiple bowel resections; DEc 22nd, 2021-proceed with neoadjuvant carbotaxol; STARTING cycle #6- AUC-5 sec to persistent thrombocytopenia.  #Status post #7 CarboTaxol -March 2022-debulking surgery TAH/BSO; bowel resections; ileostomy/mucous fistula.  # April 13th-restart CarboTaxol No. 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 GENETICS- NEG; HRD-    # PALLIATIVE CARE EVALUATION:  # PAIN MANAGEMENT:    DIAGNOSIS: Uterine cancer  STAGE:   IV      ;  GOALS: control  CURRENT/MOST RECENT THERAPY : Carbotaxol    Uterine cancer ([place]) (Resolved)  09/04/2019 Initial Diagnosis   Gynecologic cancer [facility])   Endometrial cancer ([place])  09/14/2019 - 06/12/2020 Chemotherapy   Patient is on  Treatment Plan : OVARIAN Carboplatin (AUC 6) / Paclitaxel (175) q21d x 6 cycles     10/05/2019 Initial Diagnosis   Endometrial cancer ([facility])    Genetic Testing   Negative germline genetic testing. No pathogenic variants identified on the [facility] panel. The report date is 10/01/2019. HRD testing was ordered but cancelled due to not enough sample/endometrial diagnosis.   The [facility] gene panel offered by [employer] includes sequencing and deletion/duplication testing of the following 35 genes: APC, ATM, AXIN2, BARD1, BMPR1A, BRCA1, BRCA2, BRIP1, CHD1, CDK4, CDKN2A, CHEK2, EPCAM (large rearrangement only), HOXB13, GALNT12, MLH1, MSH2, MSH3, MSH6, MUTYH, NBN, NTHL1, PALB2, PMS2, PTEN, RAD51C, RAD51D, RNF43, RPS20, SMAD4, STK11, and TP53. Sequencing was performed for select regions of POLE and POLD1, and large rearrangement analysis was performed for select regions of GREM1.    01/16/2020 Cancer Staging   Staging form: Corpus Uteri - Carcinoma and Carcinosarcoma, AJCC 8th Edition - Clinical: Stage IVB (cM1) - Signed by Cammie Sickle, MD on 01/16/2020   10/07/2020 -  Chemotherapy   Patient is on Treatment Plan : UTERINE Lenvatinib + Pembrolizumab q21d        HISTORY OF PRESENTING ILLNESS: Ambulating independently.  Alone. Jeanette Allen 65 y.o.  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  Patient continues to have low back pain radiating to the right LE.  Intermittent nausea intermittent abdominal discomfort not any worse.  No vomiting.  No worsening constipation.  Review of Systems  Constitutional:  Positive for malaise/fatigue. Negative for chills, diaphoresis, fever and weight loss.  HENT:  Negative for nosebleeds and sore throat.   Eyes:  Negative for double vision.  Respiratory:  Negative for cough, hemoptysis, sputum production,  shortness  of breath and wheezing.   Cardiovascular:  Negative for chest pain, palpitations, orthopnea and leg swelling.  Gastrointestinal:  Positive for constipation. Negative for blood in stool, diarrhea, heartburn, melena and vomiting.  Genitourinary:  Negative for dysuria, frequency and urgency.  Musculoskeletal:  Positive for myalgias. Negative for back pain and joint pain.  Skin: Negative.  Negative for itching and rash.  Neurological:  Negative for dizziness, tingling, focal weakness, weakness and headaches.  Endo/Heme/Allergies:  Does not bruise/bleed easily.  Psychiatric/Behavioral:  Negative for depression. The patient is not nervous/anxious and does not have insomnia.     MEDICAL HISTORY:  Past Medical History:  Diagnosis Date  . Arthritis   . Cancer ([place])   . Hypertension     SURGICAL HISTORY: Past Surgical History:  Procedure Laterality Date  . APPENDECTOMY    . COLONOSCOPY WITH PROPOFOL N/A 10/01/2019   Procedure: COLONOSCOPY WITH PROPOFOL;  Surgeon: Lin Landsman, MD;  Location: [facility] ENDOSCOPY;  Service: Gastroenterology;  Laterality: N/A;  . ESOPHAGOGASTRODUODENOSCOPY (EGD) WITH PROPOFOL N/A 09/12/2019   Procedure: ESOPHAGOGASTRODUODENOSCOPY (EGD) WITH PROPOFOL;  Surgeon: Lin Landsman, MD;  Location: [facility] ENDOSCOPY;  Service: Gastroenterology;  Laterality: N/A;    SOCIAL HISTORY: Social History   Socioeconomic History  . Marital status: Married    Spouse name: Not on file  . Number of children: Not on file  . Years of education: Not on file  . Highest education level: Not on file  Occupational History  . Not on file  Tobacco Use  . Smoking status: Former    Types: Cigarettes  . Smokeless tobacco: Never  Vaping Use  . Vaping Use: Never used  Substance and Sexual Activity  . Alcohol use: Not Currently  . Drug use: Never  . Sexual activity: Yes  Other Topics Concern  . Not on file  Social History Narrative   Lives with husband; near [place].  Worked Network engineer at [employer]; smoke 1/2 ppd/slowed; no alcohol; 3 children [2 boys; one girl- [employer]]   Social Determinants of Health   Financial Resource Strain: Not on file  Food Insecurity: Not on file  Transportation Needs: Not on file  Physical Activity: Not on file  Stress: Not on file  Social Connections: Not on file  Intimate Partner Violence: Not on file    FAMILY HISTORY: Family History  Problem Relation Age of Onset  . Diabetes Maternal Grandmother   . Hypertension Maternal Grandmother   . Stroke Maternal Grandmother     ALLERGIES:  has No Known Allergies.  MEDICATIONS:  No current facility-administered medications for this visit.   No current outpatient medications on file.   Facility-Administered Medications Ordered in Other Visits  Medication Dose Route Frequency Provider Last Rate Last Admin  . 0.9 % NaCl with KCl 20 mEq/ L  infusion   Intravenous Continuous Debbe Odea, MD 100 mL/hr at 10/07/20 1247 New Bag at 10/07/20 1247  . acetaminophen (TYLENOL) tablet 650 mg  650 mg Oral Q6H PRN Para Skeans, MD      . amLODipine (NORVASC) tablet 5 mg  5 mg Oral Daily Para Skeans, MD   5 mg at 10/07/20 1053  . FLUoxetine (PROZAC) capsule 40 mg  40 mg Oral Daily Florina Ou V, MD   40 mg at 10/07/20 1053  . heparin injection 5,000 Units  5,000 Units Subcutaneous Q8H Para Skeans, MD   5,000 Units at 10/07/20 0307  . hydrALAZINE (APRESOLINE) injection 5 mg  5 mg Intravenous  Q4H PRN Para Skeans, MD      . morphine 2 MG/ML injection 2 mg  2 mg Intravenous Q2H PRN Athena Masse, MD      . ondansetron [facility]) injection 4 mg  4 mg Intravenous Q6H PRN Debbe Odea, MD      . pantoprazole (PROTONIX) injection 40 mg  40 mg Intravenous Q24H Para Skeans, MD   40 mg at 10/07/20 0306  . prochlorperazine (COMPAZINE) injection 10 mg  10 mg Intravenous Q6H Rizwan, Eunice Blase, MD   10 mg at 10/07/20 1248      .  PHYSICAL EXAMINATION: ECOG PERFORMANCE STATUS: 1  - Symptomatic but completely ambulatory  Vitals:   10/02/20 0907  BP: 124/81  Pulse: 82  Resp: 16  Temp: (!) 97 F (36.1 C)   Filed Weights   10/02/20 0907  Weight: 151 lb 9.6 oz (68.8 kg)    Physical Exam Constitutional:      Comments: Ambulating independently.  Accompanied by husband.  HENT:     Head: Normocephalic and atraumatic.     Mouth/Throat:     Pharynx: No oropharyngeal exudate.  Eyes:     Pupils: Pupils are equal, round, and reactive to light.  Cardiovascular:     Rate and Rhythm: Normal rate and regular rhythm.  Pulmonary:     Effort: Pulmonary effort is normal. No respiratory distress.     Breath sounds: Normal breath sounds. No wheezing.  Abdominal:     General: Bowel sounds are normal. There is distension.     Palpations: Abdomen is soft. There is no mass.     Tenderness: There is no abdominal tenderness. There is no guarding or rebound.  Musculoskeletal:        General: No tenderness. Normal range of motion.     Cervical back: Normal range of motion and neck supple.  Skin:    General: Skin is warm.  Neurological:     Mental Status: She is alert and oriented to person, place, and time.  Psychiatric:        Mood and Affect: Affect normal.     LABORATORY DATA:  I have reviewed the data as listed Lab Results  Component Value Date   WBC 9.9 10/07/2020   HGB 13.3 10/07/2020   HCT 37.0 10/07/2020   MCV 100.8 (H) 10/07/2020   PLT 172 10/07/2020   Recent Labs    09/12/20 0959 10/06/20 0915 10/07/20 0656  NA 137 135 136  K 4.5 5.2* 4.0  CL 107 102 104  CO2 _0 GLUCOSE 99 132* 121*  BUN 22 26* 20  CREATININE 1.06* 1.38* 1.05*  CALCIUM 9.7 10.7* 10.1  GFRNONAA 58* 42* 59*  PROT 7.8 9.2* 7.6  ALBUMIN 4.1 4.6 3.9  AST _1 ALT 35 22 21  ALKPHOS 79 81 70  BILITOT 0.5 0.8 1.8*    RADIOGRAPHIC STUDIES: I have personally reviewed the radiological images as listed and agreed with the findings in the report. MR LUMBAR SPINE W WO  CONTRAST  Result Date: 09/30/2020 CLINICAL DATA:  Low back pain for over 6 weeks. Cancer suspected (undergoing treatment for endometrial cancer. EXAM: MRI LUMBAR SPINE WITHOUT AND WITH CONTRAST TECHNIQUE: Multiplanar and multiecho pulse sequences of the lumbar spine were obtained without and with intravenous contrast. CONTRAST:  9m GADAVIST GADOBUTROL 1 MMOL/ML IV SOLN COMPARISON:  Five lumbar type vertebrae FINDINGS: Segmentation:  Standard. Alignment:  Mild retrolisthesis at L3-4.  Mild scoliosis Vertebrae: Benign  heterogeneity of marrow. No evidence of fracture or aggressive bone lesion Conus medullaris and cauda equina: Conus extends to the L1-2 level. Conus and cauda equina appear normal. Paraspinal and other soft tissues: No evidence of perispinal mass or inflammation Disc levels: T12- L1: Unremarkable. L1-L2: Unremarkable. L2-L3: Mild disc narrowing and bulging L3-L4: Disc narrowing and bulging with right foraminal annular fissure. Mild retrolisthesis. Patent canal and foramina L4-L5: Disc narrowing and bulging.  Negative facets L5-S1:Degenerative facet spurring which is mild. Asymmetric left foraminal impingement from disc protrusion and disc narrowing best seen on sagittal images. IMPRESSION: 1. No acute finding or evidence of metastatic disease. 2. Lumbar spine degeneration with mild scoliosis and L3-4 retrolisthesis. No impingement to explain right leg symptoms. Focal stenosis at the left L5-S1 foramen. Electronically Signed   By: Jorje Guild M.D.   On: 09/30/2020 05:49   CT ABDOMEN PELVIS W CONTRAST  Result Date: 10/06/2020 CLINICAL DATA:  Abdominal pain and findings suggestive of small-bowel obstruction on recent plain film examination. EXAM: CT ABDOMEN AND PELVIS WITH CONTRAST TECHNIQUE: Multidetector CT imaging of the abdomen and pelvis was performed using the standard protocol following bolus administration of intravenous contrast. CONTRAST:  9m OMNIPAQUE IOHEXOL 350 MG/ML SOLN  COMPARISON:  Plain film from earlier in the same day, PET-CT from 08/25/2020 FINDINGS: Lower chest: No acute abnormality. Hepatobiliary: Tiny dependent gallstone is noted. No wall thickening is seen. Liver is unremarkable. Pancreas: Unremarkable. No pancreatic ductal dilatation or surrounding inflammatory changes. Spleen: Normal in size without focal abnormality. Adrenals/Urinary Tract: Adrenal glands are within normal limits. Kidneys demonstrate a normal enhancement pattern bilaterally. Normal excretion of contrast is noted on delayed images. The bladder is partially distended. 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 More proximal colon appears within normal limits. The appendix has been surgically removed. Stomach is within normal limits. Proximal jejunum appears unremarkable. Dilated loops of distal jejunum and proximal ileum are noted with air and fluid identified. No discrete transition zone is identified. There is a more gradual tapering from dilated to nondilated small bowel. This is well before the loop ileostomy. The distal ileal loops beyond the loop ileostomy are within normal limits. These changes may be related to adhesions from prior surgery. No discrete mass lesion is noted. Vascular/Lymphatic: Aortic atherosclerosis. No enlarged abdominal or pelvic lymph nodes. Reproductive: Status post hysterectomy. No adnexal masses. Other: Mild free fluid is noted within the abdomen. No discrete peritoneal deposits are seen. Musculoskeletal: Mild degenerative changes of lumbar spine are noted. IMPRESSION: Changes consistent with prior hysterectomy as well as left-sided colostomy and right-sided loop ileostomy. Some dilated fluid and air-filled loops of small bowel are noted without discrete transition zone. These changes are likely related to adhesions with mild partial small bowel obstruction. Continued follow-up is recommended. Cholelithiasis without complicating factors. Slight  increase in free fluid when compared with the prior PET-CT. No discrete peritoneal implants are noted. Electronically Signed   By: MInez CatalinaM.D.   On: 10/06/2020 21:33   DG Abd 2 Views  Result Date: 10/06/2020 CLINICAL DATA:  Abdominal pain, history of endometrial cancer EXAM: ABDOMEN - 2 VIEW COMPARISON:  CT chest abdomen pelvis 07/08/2020 FINDINGS: Dilated small bowel loops in the mid abdomen. Paucity of colonic gas. Findings consistent with small bowel obstruction. Ostomy RIGHT lower quadrant. No bowel wall thickening or free air. Scattered pelvic phleboliths without urinary tract calcification. Bones demineralized. IMPRESSION: Dilated small bowel loops and paucity of colonic gas consistent with small bowel obstruction. These results will  be called to the ordering clinician or representative by the Radiologist Assistant, and communication documented in the PACS or [employer]. Electronically Signed   By: Lavonia Dana M.D.   On: 10/06/2020 12:32   DG Abd Portable 1V-Small Bowel Obstruction Protocol-initial, 8 hr delay  Result Date: 10/07/2020 CLINICAL DATA:  65 year old female with possible small-bowel obstruction on CT 2 days ago. EXAM: PORTABLE ABDOMEN - 1 VIEW COMPARISON:  CT Abdomen and Pelvis 10/06/2020 and earlier. FINDINGS: Right lower quadrant ileostomy on the comparison CT. Portable AP supine view at 0902 hours. Oral contrast appears to be present throughout the small bowel now to the level of the ileostomy. Right lower quadrant loops remain relatively decompressed as before. Excreted IV contrast also in the urinary bladder. Incidental pelvic phleboliths. There is some residual oral contrast within the stomach. Stable visualized osseous structures. Right lung base appears negative. IMPRESSION: Evidence that oral contrast has reached the right lower quadrant ileostomy on this image suggesting an incomplete or resolving bowel obstruction. Electronically Signed   By: Genevie Ann M.D.   On:  10/07/2020 09:33    ASSESSMENT & PLAN:   Endometrial cancer ([place]) # STAGE IV-High-grade serous carcinoma endometrial] pre-treatment-CEA 125 +1500   #10  CarboTaxol.-Currently on surveillance.  August 2022-PET scan no obvious evidence of any progression noted; minimal residual nodularity in the left upper quadrant without any clear metabolic uptake.  No new peritoneal disease noted.  However, Ca125-slightly elevated at 80.   #I reviewed with the patient and husband that unfortunately imaging has always underestimated her disease.  At this time it is unclear if her current symptoms [see below] are related to her underlying disease.  However if work-up concerning for recurrent malignancy causing the symptoms therapy would be recommended.  # Lower back ache- chronic-currently significantly worse radiating to the right leg [for last 6 week; currently pain every day-minimal temporary improvement with Tylenol]; August 2022 PET-no bone lesions.  Recommend MRI of the lumbar spine ASAP.   # Headaches/ sinuses- [2 weeks]/neck pain-again no evidence of sinusitis noted on the PET scan.  Recommend Claritin-D.  If not improved call us.  # fatgiue- depression-STABLE; on prozac to 40 mg/day   # DISPOSITION:  #  Follow up in week of 10th Oct- MD; labs- cbc/cmp/TSH; Wendi Snipes ]-Dr.B     All questions were answered. The patient knows to call the clinic with any problems, questions or concerns.   Cammie Sickle, MD 10/07/2020 3:43 PM

## 2020-10-02 NOTE — Telephone Encounter (Signed)
Oral Oncology Patient Advocate Encounter  Prior Authorization for Jeanette Allen has been approved.    PA# [id number] Effective dates: 10/02/20 through 03/31/21  Patients co-pay is $3450.20.  Oral Oncology Clinic will continue to follow.   [name] Phone [phone] Fax [phone] 10/02/2020 12:09 PM

## 2020-10-02 NOTE — Telephone Encounter (Signed)
Oral Oncology Patient Advocate Encounter  Received notification from [facility] that prior authorization for Michel Santee is required.   PA submitted on CoverMyMeds Key [id number] Status is pending   Oral Oncology Clinic will continue to follow.  [place] Patient [place] Phone [phone] Fax [phone] 10/02/2020 12:08 PM

## 2020-10-03 ENCOUNTER — Telehealth: Payer: Self-pay | Admitting: Pharmacy Technician

## 2020-10-03 ENCOUNTER — Ambulatory Visit: Payer: Medicare Other | Admitting: Internal Medicine

## 2020-10-03 ENCOUNTER — Encounter: Payer: Self-pay | Admitting: Internal Medicine

## 2020-10-03 MED ORDER — LENVIMA (20 MG DAILY DOSE) 2 X 10 MG PO CPPK: 20.0000 mg | ORAL_CAPSULE | Freq: Every day | ORAL | 1 refills | Status: DC

## 2020-10-03 NOTE — Addendum Note (Signed)
Addended by: Darl Pikes on: 10/03/2020 01:41 PM   Modules accepted: Orders

## 2020-10-03 NOTE — Telephone Encounter (Signed)
Oral Oncology Patient Advocate Encounter  Met patient in infusion suite to complete application for [employer] in an effort to reduce patient's out of pocket expense for Lenvima to $0.    Application completed and faxed to [phone].   [employer] patient assistance phone number for follow up is [phone].   This encounter will be updated until final determination.   [place] Patient [place] Phone [phone] Fax [phone] 10/15/2020 11:18 AM

## 2020-10-04 HISTORY — PX: GASTROSTOMY TUBE PLACEMENT: SHX655

## 2020-10-06 ENCOUNTER — Ambulatory Visit
Admission: RE | Admit: 2020-10-06 | Discharge: 2020-10-06 | Disposition: A | Discharge status: Home / Self Care | Payer: Medicare Other | Source: Ambulatory Visit | Attending: Nurse Practitioner | Admitting: Nurse Practitioner

## 2020-10-06 ENCOUNTER — Other Ambulatory Visit: Payer: Self-pay

## 2020-10-06 ENCOUNTER — Telehealth: Payer: Self-pay | Admitting: *Deleted

## 2020-10-06 ENCOUNTER — Other Ambulatory Visit: Payer: Self-pay | Admitting: Oncology

## 2020-10-06 ENCOUNTER — Emergency Department: Payer: Medicare Other

## 2020-10-06 ENCOUNTER — Encounter: Payer: Self-pay | Admitting: Internal Medicine

## 2020-10-06 ENCOUNTER — Ambulatory Visit
Admission: RE | Admit: 2020-10-06 | Discharge: 2020-10-06 | Disposition: A | Discharge status: Home / Self Care | Payer: Medicare Other | Source: Home / Self Care | Attending: Nurse Practitioner | Admitting: Nurse Practitioner

## 2020-10-06 ENCOUNTER — Inpatient Hospital Stay: Payer: Medicare Other

## 2020-10-06 ENCOUNTER — Inpatient Hospital Stay
Admission: EM | Admit: 2020-10-06 | Discharge: 2020-10-09 | DRG: 388 | Disposition: A | Discharge status: Home / Self Care | Payer: Medicare Other | Attending: Internal Medicine | Admitting: Internal Medicine

## 2020-10-06 ENCOUNTER — Inpatient Hospital Stay: Payer: Medicare Other | Attending: Obstetrics and Gynecology | Admitting: Nurse Practitioner

## 2020-10-06 VITALS — BP 121/91 | HR 103 | Temp 98.5°F | Resp 18 | Wt 150.2 lb

## 2020-10-06 VITALS — BP 139/69 | HR 89

## 2020-10-06 DIAGNOSIS — F418 Other specified anxiety disorders: Secondary | ICD-10-CM | POA: Insufficient documentation

## 2020-10-06 DIAGNOSIS — R110 Nausea: Secondary | ICD-10-CM | POA: Insufficient documentation

## 2020-10-06 DIAGNOSIS — I10 Essential (primary) hypertension: Secondary | ICD-10-CM | POA: Diagnosis present

## 2020-10-06 DIAGNOSIS — I2510 Atherosclerotic heart disease of native coronary artery without angina pectoris: Secondary | ICD-10-CM | POA: Diagnosis present

## 2020-10-06 DIAGNOSIS — Z9221 Personal history of antineoplastic chemotherapy: Secondary | ICD-10-CM

## 2020-10-06 DIAGNOSIS — Z8249 Family history of ischemic heart disease and other diseases of the circulatory system: Secondary | ICD-10-CM

## 2020-10-06 DIAGNOSIS — K56609 Unspecified intestinal obstruction, unspecified as to partial versus complete obstruction: Secondary | ICD-10-CM

## 2020-10-06 DIAGNOSIS — K219 Gastro-esophageal reflux disease without esophagitis: Secondary | ICD-10-CM | POA: Diagnosis present

## 2020-10-06 DIAGNOSIS — K8020 Calculus of gallbladder without cholecystitis without obstruction: Secondary | ICD-10-CM | POA: Diagnosis present

## 2020-10-06 DIAGNOSIS — K5651 Intestinal adhesions [bands], with partial obstruction: Secondary | ICD-10-CM | POA: Diagnosis not present

## 2020-10-06 DIAGNOSIS — Z87891 Personal history of nicotine dependence: Secondary | ICD-10-CM | POA: Insufficient documentation

## 2020-10-06 DIAGNOSIS — K56600 Partial intestinal obstruction, unspecified as to cause: Secondary | ICD-10-CM | POA: Diagnosis not present

## 2020-10-06 DIAGNOSIS — R197 Diarrhea, unspecified: Secondary | ICD-10-CM | POA: Diagnosis present

## 2020-10-06 DIAGNOSIS — Z90710 Acquired absence of both cervix and uterus: Secondary | ICD-10-CM

## 2020-10-06 DIAGNOSIS — C541 Malignant neoplasm of endometrium: Secondary | ICD-10-CM | POA: Insufficient documentation

## 2020-10-06 DIAGNOSIS — F32A Depression, unspecified: Secondary | ICD-10-CM | POA: Diagnosis present

## 2020-10-06 DIAGNOSIS — Z823 Family history of stroke: Secondary | ICD-10-CM

## 2020-10-06 DIAGNOSIS — R112 Nausea with vomiting, unspecified: Secondary | ICD-10-CM | POA: Diagnosis present

## 2020-10-06 DIAGNOSIS — N179 Acute kidney failure, unspecified: Secondary | ICD-10-CM | POA: Diagnosis present

## 2020-10-06 DIAGNOSIS — Z933 Colostomy status: Secondary | ICD-10-CM

## 2020-10-06 DIAGNOSIS — C763 Malignant neoplasm of pelvis: Secondary | ICD-10-CM

## 2020-10-06 DIAGNOSIS — Z5112 Encounter for antineoplastic immunotherapy: Secondary | ICD-10-CM | POA: Insufficient documentation

## 2020-10-06 DIAGNOSIS — Z833 Family history of diabetes mellitus: Secondary | ICD-10-CM

## 2020-10-06 DIAGNOSIS — M1990 Unspecified osteoarthritis, unspecified site: Secondary | ICD-10-CM | POA: Diagnosis present

## 2020-10-06 DIAGNOSIS — Z79899 Other long term (current) drug therapy: Secondary | ICD-10-CM

## 2020-10-06 DIAGNOSIS — Z8542 Personal history of malignant neoplasm of other parts of uterus: Secondary | ICD-10-CM

## 2020-10-06 DIAGNOSIS — F17200 Nicotine dependence, unspecified, uncomplicated: Secondary | ICD-10-CM | POA: Diagnosis present

## 2020-10-06 DIAGNOSIS — U071 COVID-19: Secondary | ICD-10-CM | POA: Diagnosis present

## 2020-10-06 DIAGNOSIS — Z720 Tobacco use: Secondary | ICD-10-CM | POA: Diagnosis present

## 2020-10-06 DIAGNOSIS — Z9049 Acquired absence of other specified parts of digestive tract: Secondary | ICD-10-CM

## 2020-10-06 DIAGNOSIS — R109 Unspecified abdominal pain: Secondary | ICD-10-CM | POA: Diagnosis present

## 2020-10-06 LAB — CBC WITH DIFFERENTIAL/PLATELET
Abs Immature Granulocytes: 0.03 10*3/uL (ref 0.00–0.07)
Basophils Absolute: 0 10*3/uL (ref 0.0–0.1)
Basophils Relative: 0 %
Eosinophils Absolute: 0 10*3/uL (ref 0.0–0.5)
Eosinophils Relative: 0 %
HCT: 44.2 % (ref 36.0–46.0)
Hemoglobin: 15.4 g/dL — ABNORMAL HIGH (ref 12.0–15.0)
Immature Granulocytes: 0 %
Lymphocytes Relative: 13 %
Lymphs Abs: 1.2 10*3/uL (ref 0.7–4.0)
MCH: 35.2 pg — ABNORMAL HIGH (ref 26.0–34.0)
MCHC: 34.8 g/dL (ref 30.0–36.0)
MCV: 101.1 fL — ABNORMAL HIGH (ref 80.0–100.0)
Monocytes Absolute: 0.5 10*3/uL (ref 0.1–1.0)
Monocytes Relative: 5 %
Neutro Abs: 7.4 10*3/uL (ref 1.7–7.7)
Neutrophils Relative %: 82 %
Platelets: 191 10*3/uL (ref 150–400)
RBC: 4.37 MIL/uL (ref 3.87–5.11)
RDW: 12.1 % (ref 11.5–15.5)
Smear Review: NORMAL
WBC: 9.2 10*3/uL (ref 4.0–10.5)
nRBC: 0 % (ref 0.0–0.2)

## 2020-10-06 LAB — RESP PANEL BY RT-PCR (FLU A&B, COVID) ARPGX2
Influenza A by PCR: NEGATIVE
Influenza B by PCR: NEGATIVE
SARS Coronavirus 2 by RT PCR: POSITIVE — AB

## 2020-10-06 LAB — COMPREHENSIVE METABOLIC PANEL
ALT: 22 U/L (ref 0–44)
AST: 24 U/L (ref 15–41)
Albumin: 4.6 g/dL (ref 3.5–5.0)
Alkaline Phosphatase: 81 U/L (ref 38–126)
Anion gap: 10 (ref 5–15)
BUN: 26 mg/dL — ABNORMAL HIGH (ref 8–23)
CO2: 23 mmol/L (ref 22–32)
Calcium: 10.7 mg/dL — ABNORMAL HIGH (ref 8.9–10.3)
Chloride: 102 mmol/L (ref 98–111)
Creatinine, Ser: 1.38 mg/dL — ABNORMAL HIGH (ref 0.44–1.00)
GFR, Estimated: 42 mL/min — ABNORMAL LOW (ref >60)
Glucose, Bld: 132 mg/dL — ABNORMAL HIGH (ref 70–99)
Potassium: 5.2 mmol/L — ABNORMAL HIGH (ref 3.5–5.1)
Sodium: 135 mmol/L (ref 135–145)
Total Bilirubin: 0.8 mg/dL (ref 0.3–1.2)
Total Protein: 9.2 g/dL — ABNORMAL HIGH (ref 6.5–8.1)

## 2020-10-06 LAB — MAGNESIUM: Magnesium: 1.9 mg/dL (ref 1.7–2.4)

## 2020-10-06 MED ORDER — SODIUM CHLORIDE 0.9 % IV SOLN
INTRAVENOUS | Status: DC
  Filled 2020-10-06 (×2): qty 250

## 2020-10-06 MED ORDER — LACTATED RINGERS IV BOLUS
1000.0000 mL | Freq: Once | INTRAVENOUS | Status: AC
  Administered 2020-10-06: 1000 mL via INTRAVENOUS

## 2020-10-06 MED ORDER — MORPHINE SULFATE (PF) 2 MG/ML IV SOLN
4.0000 mg | Freq: Once | INTRAVENOUS | Status: AC
  Administered 2020-10-06: 4 mg via INTRAVENOUS
  Filled 2020-10-06: qty 2

## 2020-10-06 MED ORDER — IOHEXOL 350 MG/ML SOLN
80.0000 mL | Freq: Once | INTRAVENOUS | Status: AC | PRN
  Administered 2020-10-06: 80 mL via INTRAVENOUS

## 2020-10-06 MED ORDER — ZOLEDRONIC ACID 4 MG/100ML IV SOLN
3.3000 mg | Freq: Once | INTRAVENOUS | Status: AC
  Administered 2020-10-06: 3.3 mg via INTRAVENOUS
  Filled 2020-10-06: qty 100

## 2020-10-06 MED ORDER — METOCLOPRAMIDE HCL 5 MG/ML IJ SOLN
10.0000 mg | Freq: Once | INTRAMUSCULAR | Status: AC
  Administered 2020-10-06: 10 mg via INTRAVENOUS
  Filled 2020-10-06: qty 2

## 2020-10-06 MED ORDER — SODIUM CHLORIDE 0.9 % IV SOLN: 8.0000 mg | Freq: Once | INTRAVENOUS | Status: DC

## 2020-10-06 MED ORDER — PROCHLORPERAZINE EDISYLATE 10 MG/2ML IJ SOLN
10.0000 mg | Freq: Once | INTRAMUSCULAR | Status: AC
  Administered 2020-10-06: 10 mg via INTRAVENOUS
  Filled 2020-10-06: qty 2

## 2020-10-06 MED ORDER — MORPHINE SULFATE (PF) 4 MG/ML IV SOLN
4.0000 mg | Freq: Once | INTRAVENOUS | Status: AC
  Administered 2020-10-06: 4 mg via INTRAVENOUS
  Filled 2020-10-06: qty 1

## 2020-10-06 MED ORDER — ONDANSETRON HCL 4 MG/2ML IJ SOLN
8.0000 mg | Freq: Once | INTRAMUSCULAR | Status: AC
  Administered 2020-10-06: 8 mg via INTRAVENOUS
  Filled 2020-10-06: qty 4

## 2020-10-06 MED ORDER — ZOLEDRONIC ACID 4 MG/100ML IV SOLN: 4.0000 mg | Freq: Once | INTRAVENOUS | Status: DC

## 2020-10-06 MED ORDER — HYDROMORPHONE HCL 1 MG/ML IJ SOLN: 0.5000 mg | Freq: Once | INTRAMUSCULAR | Status: DC

## 2020-10-06 NOTE — Telephone Encounter (Signed)
Called report  IMPRESSION: Dilated small bowel loops and paucity of colonic gas consistent with small bowel obstruction.   These results will be called to the ordering clinician or representative by the Radiologist Assistant, and communication documented in the PACS or [employer].     Electronically Signed   By: Lavonia Dana M.D.   On: 10/06/2020 12:32

## 2020-10-06 NOTE — Progress Notes (Signed)
Pt reports complete resolution of nausea and pain after morphine and compazine given. Pt given another dose of Morphine at 1500 and Zofran. Pt reports that pain again improved after administration of morphine. Pt discharged at 3:30pm in stable condition. Pt to go home and shower and will be returning to the ED for continued medical management of small bowel obstruction.

## 2020-10-06 NOTE — ED Provider Notes (Addendum)
[facility] Provider Note  ____________________________________________   Event Date/Time   First MD Initiated Contact with Patient 10/06/20 2113     (approximate)  I have reviewed the triage vital signs and the nursing notes.   HISTORY  Chief Complaint Abdominal Pain   HPI Jeanette Allen is a 65 y.o. female past medical history of arthritis, HTN, CAD, depression, GERD, HDL, hep C, cancer, and endometrial cancer s/p total hysterectomy, bilateral salpingo-oophorectomy, and rectosigmoid resection w/ creation of loop ileostomy currently undergoing outpatient chemotherapy who presents for assessment of some abdominal pain nausea and vomiting with a KUB obtained earlier today concerning for possible SBO.  States stable for yesterday she tried developing some generalized crampy abdominal pain and has had some nonbloody nonbilious vomiting.  She states she has not had any ostomy output for 1 day.  He denies any new headache, earache, sore throat, chest pain, cough, fevers, urinary symptoms rash or extremity pain.  No other acute concerns at this time.  She states she discussed with her oncologist NP before coming in to see if she could try medical management first versus going to [facility] for surgery and wanted to come here to see if she could be managed medically.         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   Goals of care, counseling/discussion 09/04/2019   Hepatitis C infection 08/29/2019   CAD (coronary artery disease) 08/29/2019   Depression 08/29/2019   Esophageal reflux 08/29/2019   Hyperlipidemia 08/29/2019   Hypertension 08/29/2019   Nonrheumatic mitral valve disorder 08/29/2019    Vitamin B12 deficiency 08/29/2019   Vitamin D deficiency 08/29/2019   Tobacco abuse 08/29/2019   Acute hepatitis C 08/13/2019   DDD (degenerative disc disease), lumbar 08/02/2018   Elevation of level of transaminase and lactic acid dehydrogenase (LDH) 02/18/2017   Nausea 06/27/2013    Past Surgical History:  Procedure Laterality Date   APPENDECTOMY     COLONOSCOPY WITH PROPOFOL N/A 10/01/2019   Procedure: COLONOSCOPY WITH PROPOFOL;  Surgeon: Lin Landsman, MD;  Location: [facility] ENDOSCOPY;  Service: Gastroenterology;  Laterality: N/A;   ESOPHAGOGASTRODUODENOSCOPY (EGD) WITH PROPOFOL N/A 09/12/2019   Procedure: ESOPHAGOGASTRODUODENOSCOPY (EGD) WITH PROPOFOL;  Surgeon: Lin Landsman, MD;  Location: [facility] ENDOSCOPY;  Service: Gastroenterology;  Laterality: N/A;    Prior to Admission medications   Medication Sig Start Date End Date Taking? Authorizing Provider  acetaminophen (TYLENOL) 500 MG tablet Take by mouth.    [provider]  ergocalciferol (VITAMIN D2) 1.25 MG (50000 UT) capsule Take by mouth. 07/09/19   [provider]  FLUoxetine (PROZAC) 40 MG capsule Take 1 capsule (40 mg total) by mouth daily. 04/16/20   Cammie Sickle, MD  lenvatinib 20 mg daily dose (LENVIMA, 20 MG DAILY DOSE,) 2 x 10 MG capsule Take 2 capsules (20 mg total) by mouth daily. 10/03/20   Cammie Sickle, MD  omeprazole (PRILOSEC) 40 MG capsule Take 1 capsule (40 mg total) by mouth 2 (two) times daily before a meal. 09/17/20   Cammie Sickle, MD  ondansetron (ZOFRAN) 8 MG tablet One pill every 8 hours as needed for nausea/vomitting. 10/05/19  Cammie Sickle, MD  ondansetron (ZOFRAN-ODT) 4 MG disintegrating tablet Take 4 mg by mouth every 8 (eight) hours as needed for nausea or vomiting.    [provider]  prochlorperazine (COMPAZINE) 10 MG tablet Take 1 tablet (10 mg total) by mouth every 6 (six) hours as needed for nausea or vomiting. 05/07/20   Cammie Sickle, MD    Allergies Patient has no known allergies.  Family History  Problem Relation Age of Onset   Diabetes Maternal Grandmother    Hypertension Maternal Grandmother    Stroke Maternal Grandmother     Social History Social History   Tobacco Use   Smoking status: Every Day   Smokeless tobacco: Never  Vaping Use   Vaping Use: Never used  Substance Use Topics   Alcohol use: Not Currently   Drug use: Never    Review of Systems  Review of Systems  Constitutional:  Negative for chills and fever.  HENT:  Negative for sore throat.   Eyes:  Negative for pain.  Respiratory:  Negative for cough and stridor.   Cardiovascular:  Negative for chest pain.  Gastrointestinal:  Positive for abdominal pain, nausea and vomiting.  Genitourinary:  Negative for dysuria.  Musculoskeletal:  Negative for myalgias.  Skin:  Negative for rash.  Neurological:  Negative for seizures, loss of consciousness and headaches.  Psychiatric/Behavioral:  Negative for suicidal ideas.   All other systems reviewed and are negative.    ____________________________________________   PHYSICAL EXAM:  VITAL SIGNS: ED Triage Vitals  Enc Vitals Group     BP 10/06/20 1918 (!) 153/89     Pulse Rate 10/06/20 1918 98     Resp 10/06/20 1918 20     Temp 10/06/20 1918 98.3 F (36.8 C)     Temp Source 10/06/20 1918 Oral     SpO2 10/06/20 1918 98 %     Weight 10/06/20 1920 150 lb (68 kg)     Height 10/06/20 1920 5\' 4"  (1.626 m)     Head Circumference --      Peak Flow --      Pain Score 10/06/20 1919 3     Pain Loc --      Pain Edu? --      Excl. in [place]? --    Vitals:   10/06/20 2136 10/06/20 2200  BP: 137/81 (!) 143/68  Pulse: (!) 107 88  Resp: 19 19  Temp:    SpO2: 96% 97%   Physical Exam Vitals and nursing note reviewed.  Constitutional:      General: She is not in acute distress.    Appearance: She is well-developed.  HENT:     Head: Normocephalic and atraumatic.     Right Ear:  External ear normal.     Left Ear: External ear normal.     Nose: Nose normal.     Mouth/Throat:     Mouth: Mucous membranes are dry.  Eyes:     Conjunctiva/sclera: Conjunctivae normal.  Cardiovascular:     Rate and Rhythm: Normal rate and regular rhythm.     Heart sounds: No murmur heard. Pulmonary:     Effort: Pulmonary effort is normal. No respiratory distress.     Breath sounds: Normal breath sounds.  Abdominal:     Palpations: Abdomen is soft.     Tenderness: There is generalized abdominal tenderness.  Musculoskeletal:     Cervical back: Neck supple.  Skin:    General: Skin is warm and dry.  Neurological:     Mental Status: She is alert and oriented to person, place, and time.  Psychiatric:        Mood and Affect: Mood normal.     ____________________________________________   LABS (all labs ordered are listed, but only abnormal results are displayed)  Labs Reviewed  RESP PANEL BY RT-PCR (FLU A&B, COVID) ARPGX2 - Abnormal; Notable for the following components:      Result Value   SARS Coronavirus 2 by RT PCR POSITIVE (*)    All other components within normal limits   ____________________________________________  EKG  ____________________________________________  RADIOLOGY  ED MD interpretation: KUB obtained earlier today shows dilated small bowel loops and positive colonic gas consistent with SBO.  CT abdomen pelvis shows evidence of prior hysterectomy and left-sided colostomy with right-sided loop ileostomy.  There are some dilated fluid and air-filled loops of small bowel noted without discrete transition zone.  Is possibly related to some adhesions with mild partial small bowel obstruction.  There is also evidence of cholelithiasis without cholecystitis.  No evidence of diverticulitis, kidney stone or other acute abdominal pelvic process.  Official radiology report(s): CT ABDOMEN PELVIS W CONTRAST  Result Date: 10/06/2020 CLINICAL DATA:  Abdominal pain and  findings suggestive of small-bowel obstruction on recent plain film examination. EXAM: CT ABDOMEN AND PELVIS WITH CONTRAST TECHNIQUE: Multidetector CT imaging of the abdomen and pelvis was performed using the standard protocol following bolus administration of intravenous contrast. CONTRAST:  8mL OMNIPAQUE IOHEXOL 350 MG/ML SOLN COMPARISON:  Plain film from earlier in the same day, PET-CT from 08/25/2020 FINDINGS: Lower chest: No acute abnormality. Hepatobiliary: Tiny dependent gallstone is noted. No wall thickening is seen. Liver is unremarkable. Pancreas: Unremarkable. No pancreatic ductal dilatation or surrounding inflammatory changes. Spleen: Normal in size without focal abnormality. Adrenals/Urinary Tract: Adrenal glands are within normal limits. Kidneys demonstrate a normal enhancement pattern bilaterally. Normal excretion of contrast is noted on delayed images. The bladder is partially distended. 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 More proximal colon appears within normal limits. The appendix has been surgically removed. Stomach is within normal limits. Proximal jejunum appears unremarkable. Dilated loops of distal jejunum and proximal ileum are noted with air and fluid identified. No discrete transition zone is identified. There is a more gradual tapering from dilated to nondilated small bowel. This is well before the loop ileostomy. The distal ileal loops beyond the loop ileostomy are within normal limits. These changes may be related to adhesions from prior surgery. No discrete mass lesion is noted. Vascular/Lymphatic: Aortic atherosclerosis. No enlarged abdominal or pelvic lymph nodes. Reproductive: Status post hysterectomy. No adnexal masses. Other: Mild free fluid is noted within the abdomen. No discrete peritoneal deposits are seen. Musculoskeletal: Mild degenerative changes of lumbar spine are noted. IMPRESSION: Changes consistent with prior hysterectomy as well  as left-sided colostomy and right-sided loop ileostomy. Some dilated fluid and air-filled loops of small bowel are noted without discrete transition zone. These changes are likely related to adhesions with mild partial small bowel obstruction. Continued follow-up is recommended. Cholelithiasis without complicating factors. Slight increase in free fluid when compared with the prior PET-CT. No discrete peritoneal implants are noted. Electronically Signed   By: Inez Catalina M.D.   On: 10/06/2020 21:33   DG Abd 2 Views  Result Date: 10/06/2020 CLINICAL DATA:  Abdominal pain, history of endometrial cancer EXAM: ABDOMEN - 2 VIEW COMPARISON:  CT chest abdomen pelvis 07/08/2020 FINDINGS: Dilated small bowel loops in the mid abdomen.  Paucity of colonic gas. Findings consistent with small bowel obstruction. Ostomy RIGHT lower quadrant. No bowel wall thickening or free air. Scattered pelvic phleboliths without urinary tract calcification. Bones demineralized. IMPRESSION: Dilated small bowel loops and paucity of colonic gas consistent with small bowel obstruction. These results will be called to the ordering clinician or representative by the Radiologist Assistant, and communication documented in the PACS or [employer]. Electronically Signed   By: Lavonia Dana M.D.   On: 10/06/2020 12:32    ____________________________________________   PROCEDURES  Procedure(s) performed (including Critical Care):  Procedures   ____________________________________________   INITIAL IMPRESSION / ASSESSMENT AND PLAN / ED COURSE      Presents above to history exam for assessment of nausea vomiting abdominal pain with KUB obtained earlier today concerning for SBO.  On arrival patient is hypertensive with a BP of 153/89 with stable vital signs on arrival  I was able to review this KUB which showed findings concerning for SBO.  Also had lab work done earlier today remarkable for evidence of AKI with rounding of 1.38  compared to 1.063 weeks ago and calcium of 10.7 and a K of 5.2 CBC without leukocytosis and hemoglobin of 15.4 and normal platelets.  Without other significant electrolyte or metabolic derangements.  Magnesium is within normal limits.  CT abdomen pelvis shows evidence of prior hysterectomy and left-sided colostomy with right-sided loop ileostomy.  There are some dilated fluid and air-filled loops of small bowel noted without discrete transition zone.  Is possibly related to some adhesions with mild partial small bowel obstruction.  There is also evidence of cholelithiasis without cholecystitis.  No evidence of diverticulitis, kidney stone or other acute abdominal pelvic process.  Suspect this is likely the cause of patient's symptoms today.  I did reach out to on-call surgeon Dr. Lysle Pearl who was in surgery.  I do not believe patient requires any emergent neurosurgical intervention given she has had most recent extensive abdominal surgeries at [facility] with an ileostomy I think it is reasonable to touch base with surgery before admitting to hospital service for trial of conservative management which is her preference.  Gust with Dr. Lysle Pearl she is okay to admit to hospitalist service here.  I will plan to admit to medicine service.       ____________________________________________   FINAL CLINICAL IMPRESSION(S) / ED DIAGNOSES  Final diagnoses:  SBO (small bowel obstruction) (HCC)  AKI (acute kidney injury) ([place])  COVID    Medications  iohexol (OMNIPAQUE) 350 MG/ML injection 80 mL (80 mLs Intravenous Contrast Given 10/06/20 2048)  lactated ringers bolus 1,000 mL (1,000 mLs Intravenous New Bag/Given 10/06/20 2159)  metoCLOPramide (REGLAN) injection 10 mg (10 mg Intravenous Given 10/06/20 2156)  morphine 4 MG/ML injection 4 mg (4 mg Intravenous Given 10/06/20 2216)     ED Discharge Orders     None        Note:  This document was prepared using Dragon voice recognition software and may include  unintentional dictation errors.    Lucrezia Starch, MD 10/06/20 2331    Lucrezia Starch, MD 10/07/20 0000

## 2020-10-06 NOTE — ED Triage Notes (Signed)
Pt in with co abd pain was sent here from cancer center for admission due to small bowel obstruction seen on imaging today. Pt has endometrial cancer, had hysterectomy a few months ago.

## 2020-10-06 NOTE — Progress Notes (Deleted)
Duplicate

## 2020-10-06 NOTE — ED Notes (Signed)
Tiffany- office answering service to page on call provider for [facility] and call back Raquel at ext [phone].

## 2020-10-06 NOTE — Progress Notes (Signed)
Symptom Management [place]  Telephone:(336) [phone] [phone]) [phone]  Patient Care Team: Danelle Berry, NP as PCP - General (Nurse Practitioner) Clent Jacks, RN as Oncology Nurse Navigator Cammie Sickle, MD as Consulting Physician (Internal Medicine) Gillis Ends, MD as Referring Physician (Obstetrics)   Name of the patient: Jeanette Allen  [id number]  Oct 13, 1955   Date of visit: 10/06/20  Chief complaint/ Reason for visit- abdominal pain, nausea, vomiting  Heme/Onc history:  Oncology History Overview Note  #August 2021-ascitic fluid cytology-high-grade serous carcinoma of gynecologic origin; CT scan-behind the abdominal wall 4.6 x 2.1 mass ; within the mesentery 6.7 x 4.5 cm left of mid abdomen . BIOPSY of the peritoneal mass; high grade carcinoma ca-[phone] [Dr.Byrnett]; SEP 9th 2021- PET scan-shows peritoneal carcinomatosis; uterine uptake [Dr.Secord; unable to biopsy cervical stenosis] sigmoid colonic uptake concerning for malignancy.  No ovarian uptake.  CEA 125 +1300; CEA-2.    #Hepatitis C/ectopic pregnancy/PRBC transfusion 1995-untreated.[Dr.Vanga]  # 09/14/2019-neoadjuvant CARBO-TAXOL s/p #4 cycles-partial response; DEC 9th 2021-laparoscopy-surgery aborted given the need for multiple bowel resections; DEc 22nd, 2021-proceed with neoadjuvant carbotaxol; STARTING cycle #6- AUC-5 sec to persistent thrombocytopenia.  #Status post #7 CarboTaxol -March 2022-debulking surgery TAH/BSO; bowel resections; ileostomy/mucous fistula.  # April 13th-restart CarboTaxol No. 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 GENETICS- NEG; HRD-    # PALLIATIVE CARE EVALUATION:  # PAIN MANAGEMENT:    DIAGNOSIS: Uterine cancer  STAGE:   IV      ;  GOALS: control  CURRENT/MOST RECENT THERAPY : Carbotaxol     Uterine cancer ([name]) (Resolved)  09/04/2019 Initial Diagnosis   Gynecologic cancer [facility])   Endometrial cancer ([place])  09/14/2019 -  Chemotherapy    Patient is on Treatment Plan: OVARIAN CARBOPLATIN (AUC 6) / PACLITAXEL (175) Q21D X 6 CYCLES       10/05/2019 Initial Diagnosis   Endometrial cancer (HCC)    Genetic Testing   Negative germline genetic testing. No pathogenic variants identified on the [facility] panel. The report date is 10/01/2019. HRD testing was ordered but cancelled due to not enough sample/endometrial diagnosis.   The [facility] gene panel offered by [employer] includes sequencing and deletion/duplication testing of the following 35 genes: APC, ATM, AXIN2, BARD1, BMPR1A, BRCA1, BRCA2, BRIP1, CHD1, CDK4, CDKN2A, CHEK2, EPCAM (large rearrangement only), HOXB13, GALNT12, MLH1, MSH2, MSH3, MSH6, MUTYH, NBN, NTHL1, PALB2, PMS2, PTEN, RAD51C, RAD51D, RNF43, RPS20, SMAD4, STK11, and TP53. Sequencing was performed for select regions of POLE and POLD1, and large rearrangement analysis was performed for select regions of GREM1.    01/16/2020 Cancer Staging   Staging form: Corpus Uteri - Carcinoma and Carcinosarcoma, AJCC 8th Edition - Clinical: Stage IVB (cM1) - Signed by Cammie Sickle, MD on 01/16/2020     Interval history-patient is 65 year old female with history of hepatitis c, hypertension, DDD of lumbar spine, and high-grade serous carcinoma of gynecologic origin status post neoadjuvant carbo Taxol followed by laparoscopy in 12/13/2019, surgery aborted given need for multiple bowel resections, followed by adjuvant CarboTaxol.  Underwent debulking surgery March 2022 with bowel resections, ileostomy and mucous fistula.  Tumor marker has been rising and she began having symptoms of her malignancy.  Plan is to start Keytruda and lenvatinib possibly next week.  Currently on surveillance. Today, she presents to symptom management clinic for complaints of abdominal  pain, worst in the left lower quadrant, nausea, vomiting, poor  oral intake.  Symptoms started over the weekend and have continued.  Rates pain 10 out of 10.  No fevers or chills.  No chest pain or shortness of breath.  Review of systems- Review of Systems  Constitutional:  Positive for malaise/fatigue. Negative for chills, fever and weight loss.  HENT:  Negative for hearing loss, nosebleeds, sore throat and tinnitus.   Eyes:  Negative for blurred vision and double vision.  Respiratory:  Negative for cough, hemoptysis, shortness of breath and wheezing.   Cardiovascular:  Negative for chest pain, palpitations and leg swelling.  Gastrointestinal:  Positive for abdominal pain, nausea and vomiting. Negative for blood in stool, constipation, diarrhea and melena.  Genitourinary:  Negative for dysuria and urgency.  Musculoskeletal:  Negative for back pain, falls, joint pain and myalgias.  Skin:  Negative for itching and rash.  Neurological:  Negative for dizziness, tingling, sensory change, loss of consciousness, weakness and headaches.  Endo/Heme/Allergies:  Negative for environmental allergies. Does not bruise/bleed easily.  Psychiatric/Behavioral:  Negative for depression. The patient is not nervous/anxious and does not have insomnia.     No Known Allergies  Past Medical History:  Diagnosis Date   Arthritis    Cancer ([place])    Hypertension     Past Surgical History:  Procedure Laterality Date   APPENDECTOMY     COLONOSCOPY WITH PROPOFOL N/A 10/01/2019   Procedure: COLONOSCOPY WITH PROPOFOL;  Surgeon: Lin Landsman, MD;  Location: [facility] ENDOSCOPY;  Service: Gastroenterology;  Laterality: N/A;   ESOPHAGOGASTRODUODENOSCOPY (EGD) WITH PROPOFOL N/A 09/12/2019   Procedure: ESOPHAGOGASTRODUODENOSCOPY (EGD) WITH PROPOFOL;  Surgeon: Lin Landsman, MD;  Location[facility] END[facility]COPY;  Service: Gastroenterology;  Laterality: N/A;    Social History   Socioeconomic History   Marital status:  Married    Spouse name: Not on file   Number of children: Not on file   Years of education: Not on file   Highest education level: Not on file  Occupational History   Not on file  Tobacco Use   Smoking status: Every Day   Smokeless tobacco: Never  Vaping Use   Vaping Use: Never used  Substance and Sexual Activity   Alcohol use: Not Currently   Drug use: Never   Sexual activity: Yes  Other Topics Concern   Not on file  Social History Narrative   Lives with husband; near [place]. Worked Network engineer at [employer]; smoke 1/2 ppd/slowed; no alcohol; 3 children [2 boys; one girl- [employer]]   Social Determinants of Health   Financial Resource Strain: Not on file  Food Insecurity: Not on file  Transportation Needs: Not on file  Physical Activity: Not on file  Stress: Not on file  Social Connections: Not on file  Intimate Partner Violence: Not on file    Family History  Problem Relation Age of Onset   Diabetes Maternal Grandmother    Hypertension Maternal Grandmother    Stroke Maternal Grandmother     Current Outpatient Medications:    acetaminophen (TYLENOL) 500 MG tablet, Take by mouth., Disp: , Rfl:    ergocalciferol (VITAMIN D2) 1.25 MG (50000 UT) capsule, Take by mouth., Disp: , Rfl:    FLUoxetine (PROZAC) 40 MG capsule, Take 1 capsule (40 mg total) by mouth daily., Disp: 30 capsule, Rfl: 3   lenvatinib 20 mg daily dose (LENVIMA, 20 MG DAILY DOSE,) 2 x 10 MG capsule, Take 2 capsules (20 mg total) by mouth daily., Disp: 60 capsule, Rfl: 1   omeprazole (PRILOSEC) 40  MG capsule, Take 1 capsule (40 mg total) by mouth 2 (two) times daily before a meal., Disp: 60 capsule, Rfl: 1   ondansetron (ZOFRAN) 8 MG tablet, One pill every 8 hours as needed for nausea/vomitting., Disp: 60 tablet, Rfl: 1   ondansetron (ZOFRAN-ODT) 4 MG disintegrating tablet, Take 4 mg by mouth every 8 (eight) hours as needed for nausea or vomiting., Disp: , Rfl:    prochlorperazine (COMPAZINE) 10 MG  tablet, Take 1 tablet (10 mg total) by mouth every 6 (six) hours as needed for nausea or vomiting., Disp: 40 tablet, Rfl: 1 No current facility-administered medications for this visit.  Facility-Administered Medications Ordered in Other Visits:    0.9 %  sodium chloride infusion, , Intravenous, Continuous, Verlon Au, NP, Stopped at 10/06/20 1111  Physical exam:  Vitals:   10/06/20 0927  BP: (!) 121/91  Pulse: (!) 103  Resp: 18  Temp: 98.5 F (36.9 C)  SpO2: 99%  Weight: 150 lb 3.2 oz (68.1 kg)   Physical Exam Constitutional:      General: She is not in acute distress.    Appearance: She is well-developed. She is not ill-appearing.     Comments: Accompanied by husband  HENT:     Head: Atraumatic.     Nose: Nose normal.     Mouth/Throat:     Pharynx: No oropharyngeal exudate.  Eyes:     General: No scleral icterus.    Conjunctiva/sclera: Conjunctivae normal.  Cardiovascular:     Rate and Rhythm: Normal rate and regular rhythm.  Pulmonary:     Effort: Pulmonary effort is normal.     Breath sounds: Normal breath sounds.  Abdominal:     General: There is no distension.     Palpations: Abdomen is soft.     Tenderness: There is abdominal tenderness.     Comments: Watery dark stool in ostomy bag  Musculoskeletal:        General: No tenderness or deformity. Normal range of motion.     Cervical back: Normal range of motion and neck supple.  Skin:    General: Skin is warm and dry.     Coloration: Skin is not pale.  Neurological:     General: No focal deficit present.     Mental Status: She is alert and oriented to person, place, and time.  Psychiatric:        Mood and Affect: Mood normal.        Behavior: Behavior normal.     CMP Latest Ref Rng & Units 10/06/2020  Glucose 70 - 99 mg/dL 132(H)  BUN 8 - 23 mg/dL 26(H)  Creatinine 0.44 - 1.00 mg/dL 1.38(H)  Sodium 135 - 145 mmol/L 135  Potassium 3.5 - 5.1 mmol/L 5.2(H)  Chloride 98 - 111 mmol/L 102  CO2 22 - 32  mmol/L 23  Calcium 8.9 - 10.3 mg/dL 10.7(H)  Total Protein 6.5 - 8.1 g/dL 9.2(H)  Total Bilirubin 0.3 - 1.2 mg/dL 0.8  Alkaline Phos 38 - 126 U/L 81  AST 15 - 41 U/L 24  ALT 0 - 44 U/L 22   CBC Latest Ref Rng & Units 10/06/2020  WBC 4.0 - 10.5 K/uL 9.2  Hemoglobin 12.0 - 15.0 g/dL 15.4(H)  Hematocrit 36.0 - 46.0 % 44.2  Platelets 150 - 400 K/uL 191    No images are attached to the encounter.  MR LUMBAR SPINE W WO CONTRAST  Result Date: 09/30/2020 CLINICAL DATA:  Low back pain for over 6  weeks. Cancer suspected (undergoing treatment for endometrial cancer. EXAM: MRI LUMBAR SPINE WITHOUT AND WITH CONTRAST TECHNIQUE: Multiplanar and multiecho pulse sequences of the lumbar spine were obtained without and with intravenous contrast. CONTRAST:  17m GADAVIST GADOBUTROL 1 MMOL/ML IV SOLN COMPARISON:  Five lumbar type vertebrae FINDINGS: Segmentation:  Standard. Alignment:  Mild retrolisthesis at L3-4.  Mild scoliosis Vertebrae: Benign heterogeneity of marrow. No evidence of fracture or aggressive bone lesion Conus medullaris and cauda equina: Conus extends to the L1-2 level. Conus and cauda equina appear normal. Paraspinal and other soft tissues: No evidence of perispinal mass or inflammation Disc levels: T12- L1: Unremarkable. L1-L2: Unremarkable. L2-L3: Mild disc narrowing and bulging L3-L4: Disc narrowing and bulging with right foraminal annular fissure. Mild retrolisthesis. Patent canal and foramina L4-L5: Disc narrowing and bulging.  Negative facets L5-S1:Degenerative facet spurring which is mild. Asymmetric left foraminal impingement from disc protrusion and disc narrowing best seen on sagittal images. IMPRESSION: 1. No acute finding or evidence of metastatic disease. 2. Lumbar spine degeneration with mild scoliosis and L3-4 retrolisthesis. No impingement to explain right leg symptoms. Focal stenosis at the left L5-S1 foramen. Electronically Signed   By: JJorje GuildM.D.   On: 09/30/2020 05:49    DG Abd 2 Views  Result Date: 10/06/2020 CLINICAL DATA:  Abdominal pain, history of endometrial cancer EXAM: ABDOMEN - 2 VIEW COMPARISON:  CT chest abdomen pelvis 07/08/2020 FINDINGS: Dilated small bowel loops in the mid abdomen. Paucity of colonic gas. Findings consistent with small bowel obstruction. Ostomy RIGHT lower quadrant. No bowel wall thickening or free air. Scattered pelvic phleboliths without urinary tract calcification. Bones demineralized. IMPRESSION: Dilated small bowel loops and paucity of colonic gas consistent with small bowel obstruction. These results will be called to the ordering clinician or representative by the Radiologist Assistant, and communication documented in the PACS or [employer] Electronically Signed   By: MLavonia DanaM.D.   On: 10/06/2020 12:32    Assessment and plan- Patient is a 65y.o. female   Partial small bowel obstruction - symptomatic and evidence on xray. Suspect malignancy. Options reviewed including conservative treatment with medical supportive care- IV fluids, NG, bowel rest, reglan, decadron, octreotide vs referral to [facility] for consideration of surgery. Patient elects for medical management locally but would consider surgery if symptoms not improved. Pain medication, IV fluids, and anti-emetics in clinic. Patient discharged and will go to emergency room for evaluation and hospitalization.  High grade serous carcinoma of gyn origin- Dr. BRogue Bussing& Dr. STheora Gianottiupdated regarding patient's condition. Agree with plan. Dr. BRogue Bussingspoke to patient independently as well.  Hypercalcemia- calcium 10.7. Likely related to malignancy & dehydration. Will give renally dosed zometa 3.3 mg IV once. Monitor electrolytes Hyperkalemia- K slightly elevated at 5.2. Monitor. AKI- IV fluids. Monitor.    Visit Diagnosis 1. Partial small bowel obstruction (HCC)   2. 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 Hypercalcemia     Patient expressed  understanding and was in agreement with this plan. She also understands that She can call clinic at any time with any questions, concerns, or complaints.   Thank you for allowing me to participate in the care of this very pleasant patient.   LBeckey Rutter DNP, AGNP-C [place]

## 2020-10-06 NOTE — Patient Instructions (Signed)

## 2020-10-06 NOTE — Progress Notes (Signed)
Pt reports 10/10 abdominal pain x2 days with nausea, vomiting and poor appetite.

## 2020-10-06 NOTE — ED Provider Notes (Signed)
Emergency Medicine Provider Triage Evaluation Note  Jeanette Allen , a 65 y.o. female  was evaluated in triage.  Pt complains of abdominal pain, nausea or vomiting.  Patient is a cancer patient currently receiving treatment for endometrial cancer.  Patient was symptomatic, had labs and x-ray earlier today.  Patient is positive for small bowel obstruction and has been sent to the ED for admission..  Review of Systems  Positive: Abdominal pain, nausea or vomiting.  Known small bowel obstruction Negative: Fever, chills, diarrhea, urinary changes  Physical Exam  BP (!) 153/89 (BP Location: Left Arm)   Pulse 98   Temp 98.3 F (36.8 C) (Oral)   Resp 20   SpO2 98%  Gen:   Awake, no distress   Resp:  Normal effort  MSK:   Moves extremities without difficulty  Other:  Tender diffusely along the lower abdomen.  Medical Decision Making  Medically screening exam initiated at 7:19 PM.  Appropriate orders placed.  [name] was informed that the remainder of the evaluation will be completed by another provider, this initial triage assessment does not replace that evaluation, and the importance of remaining in the ED until their evaluation is complete.  Patient here with a known small bowel obstruction.  Will require admission.  Patient had imaging, labs performed earlier today.   Darletta Moll, PA-C 10/06/20 1927    Lucrezia Starch, MD 10/06/20 [phone]

## 2020-10-07 ENCOUNTER — Inpatient Hospital Stay: Payer: Medicare Other

## 2020-10-07 ENCOUNTER — Encounter: Payer: Self-pay | Admitting: Internal Medicine

## 2020-10-07 DIAGNOSIS — K8020 Calculus of gallbladder without cholecystitis without obstruction: Secondary | ICD-10-CM | POA: Diagnosis present

## 2020-10-07 DIAGNOSIS — R112 Nausea with vomiting, unspecified: Secondary | ICD-10-CM

## 2020-10-07 DIAGNOSIS — N179 Acute kidney failure, unspecified: Secondary | ICD-10-CM

## 2020-10-07 DIAGNOSIS — Z833 Family history of diabetes mellitus: Secondary | ICD-10-CM | POA: Diagnosis not present

## 2020-10-07 DIAGNOSIS — Z90710 Acquired absence of both cervix and uterus: Secondary | ICD-10-CM | POA: Diagnosis not present

## 2020-10-07 DIAGNOSIS — I10 Essential (primary) hypertension: Secondary | ICD-10-CM

## 2020-10-07 DIAGNOSIS — R109 Unspecified abdominal pain: Secondary | ICD-10-CM | POA: Diagnosis present

## 2020-10-07 DIAGNOSIS — F32A Depression, unspecified: Secondary | ICD-10-CM | POA: Diagnosis present

## 2020-10-07 DIAGNOSIS — Z9221 Personal history of antineoplastic chemotherapy: Secondary | ICD-10-CM | POA: Diagnosis not present

## 2020-10-07 DIAGNOSIS — M1990 Unspecified osteoarthritis, unspecified site: Secondary | ICD-10-CM | POA: Diagnosis present

## 2020-10-07 DIAGNOSIS — Z933 Colostomy status: Secondary | ICD-10-CM | POA: Diagnosis not present

## 2020-10-07 DIAGNOSIS — Z8542 Personal history of malignant neoplasm of other parts of uterus: Secondary | ICD-10-CM | POA: Diagnosis not present

## 2020-10-07 DIAGNOSIS — Z8249 Family history of ischemic heart disease and other diseases of the circulatory system: Secondary | ICD-10-CM | POA: Diagnosis not present

## 2020-10-07 DIAGNOSIS — I2510 Atherosclerotic heart disease of native coronary artery without angina pectoris: Secondary | ICD-10-CM | POA: Diagnosis present

## 2020-10-07 DIAGNOSIS — Z9049 Acquired absence of other specified parts of digestive tract: Secondary | ICD-10-CM | POA: Diagnosis not present

## 2020-10-07 DIAGNOSIS — U071 COVID-19: Secondary | ICD-10-CM | POA: Diagnosis present

## 2020-10-07 DIAGNOSIS — Z79899 Other long term (current) drug therapy: Secondary | ICD-10-CM | POA: Diagnosis not present

## 2020-10-07 DIAGNOSIS — K56609 Unspecified intestinal obstruction, unspecified as to partial versus complete obstruction: Secondary | ICD-10-CM | POA: Diagnosis present

## 2020-10-07 DIAGNOSIS — R1084 Generalized abdominal pain: Secondary | ICD-10-CM

## 2020-10-07 DIAGNOSIS — K219 Gastro-esophageal reflux disease without esophagitis: Secondary | ICD-10-CM

## 2020-10-07 DIAGNOSIS — K5651 Intestinal adhesions [bands], with partial obstruction: Secondary | ICD-10-CM | POA: Diagnosis present

## 2020-10-07 DIAGNOSIS — F17200 Nicotine dependence, unspecified, uncomplicated: Secondary | ICD-10-CM | POA: Diagnosis present

## 2020-10-07 DIAGNOSIS — R197 Diarrhea, unspecified: Secondary | ICD-10-CM | POA: Diagnosis present

## 2020-10-07 DIAGNOSIS — Z823 Family history of stroke: Secondary | ICD-10-CM | POA: Diagnosis not present

## 2020-10-07 LAB — CBC
HCT: 37 % (ref 36.0–46.0)
Hemoglobin: 13.3 g/dL (ref 12.0–15.0)
MCH: 36.2 pg — ABNORMAL HIGH (ref 26.0–34.0)
MCHC: 35.9 g/dL (ref 30.0–36.0)
MCV: 100.8 fL — ABNORMAL HIGH (ref 80.0–100.0)
Platelets: 172 10*3/uL (ref 150–400)
RBC: 3.67 MIL/uL — ABNORMAL LOW (ref 3.87–5.11)
RDW: 12 % (ref 11.5–15.5)
WBC: 9.9 10*3/uL (ref 4.0–10.5)
nRBC: 0 % (ref 0.0–0.2)

## 2020-10-07 LAB — COMPREHENSIVE METABOLIC PANEL
ALT: 21 U/L (ref 0–44)
AST: 24 U/L (ref 15–41)
Albumin: 3.9 g/dL (ref 3.5–5.0)
Alkaline Phosphatase: 70 U/L (ref 38–126)
Anion gap: 10 (ref 5–15)
BUN: 20 mg/dL (ref 8–23)
CO2: 22 mmol/L (ref 22–32)
Calcium: 10.1 mg/dL (ref 8.9–10.3)
Chloride: 104 mmol/L (ref 98–111)
Creatinine, Ser: 1.05 mg/dL — ABNORMAL HIGH (ref 0.44–1.00)
GFR, Estimated: 59 mL/min — ABNORMAL LOW (ref >60)
Glucose, Bld: 121 mg/dL — ABNORMAL HIGH (ref 70–99)
Potassium: 4 mmol/L (ref 3.5–5.1)
Sodium: 136 mmol/L (ref 135–145)
Total Bilirubin: 1.8 mg/dL — ABNORMAL HIGH (ref 0.3–1.2)
Total Protein: 7.6 g/dL (ref 6.5–8.1)

## 2020-10-07 LAB — CA 125: Cancer Antigen (CA) 125: 195 U/mL — ABNORMAL HIGH (ref 0.0–38.1)

## 2020-10-07 LAB — MAGNESIUM: Magnesium: 2 mg/dL (ref 1.7–2.4)

## 2020-10-07 LAB — HIV ANTIBODY (ROUTINE TESTING W REFLEX): HIV Screen 4th Generation wRfx: NONREACTIVE

## 2020-10-07 MED ORDER — PROCHLORPERAZINE EDISYLATE 10 MG/2ML IJ SOLN
10.0000 mg | Freq: Four times a day (QID) | INTRAMUSCULAR | Status: DC
  Administered 2020-10-07 – 2020-10-08 (×5): 10 mg via INTRAVENOUS
  Filled 2020-10-07 (×6): qty 2

## 2020-10-07 MED ORDER — SODIUM CHLORIDE 0.9 % IV SOLN
200.0000 mg | Freq: Once | INTRAVENOUS | Status: AC
  Administered 2020-10-07: 18:00:00 200 mg via INTRAVENOUS
  Filled 2020-10-07: qty 200

## 2020-10-07 MED ORDER — PROCHLORPERAZINE MALEATE 10 MG PO TABS
10.0000 mg | ORAL_TABLET | Freq: Four times a day (QID) | ORAL | Status: DC | PRN
  Administered 2020-10-07: 09:00:00 10 mg via ORAL
  Filled 2020-10-07 (×3): qty 1

## 2020-10-07 MED ORDER — HEPARIN SODIUM (PORCINE) 5000 UNIT/ML IJ SOLN
5000.0000 [IU] | Freq: Three times a day (TID) | INTRAMUSCULAR | Status: DC
  Administered 2020-10-07 – 2020-10-08 (×5): 5000 [IU] via SUBCUTANEOUS
  Filled 2020-10-07 (×5): qty 1

## 2020-10-07 MED ORDER — ONDANSETRON HCL 4 MG/2ML IJ SOLN
4.0000 mg | Freq: Four times a day (QID) | INTRAMUSCULAR | Status: DC | PRN
  Administered 2020-10-07 – 2020-10-09 (×5): 4 mg via INTRAVENOUS
  Filled 2020-10-07 (×5): qty 2

## 2020-10-07 MED ORDER — MORPHINE SULFATE (PF) 2 MG/ML IV SOLN
2.0000 mg | INTRAVENOUS | Status: AC | PRN
  Administered 2020-10-07: 18:00:00 2 mg via INTRAVENOUS
  Filled 2020-10-07: qty 1

## 2020-10-07 MED ORDER — REMDESIVIR 100 MG IV SOLR
100.0000 mg | Freq: Every day | INTRAVENOUS | Status: DC
Start: 2020-10-08 — End: 2020-10-08
  Administered 2020-10-08: 09:00:00 100 mg via INTRAVENOUS
  Filled 2020-10-07: qty 100

## 2020-10-07 MED ORDER — LENVATINIB (20 MG DAILY DOSE) 2 X 10 MG PO CPPK: 20.0000 mg | ORAL_CAPSULE | Freq: Every day | ORAL | Status: DC

## 2020-10-07 MED ORDER — AMLODIPINE BESYLATE 5 MG PO TABS
5.0000 mg | ORAL_TABLET | Freq: Every day | ORAL | Status: DC
  Administered 2020-10-07 – 2020-10-08 (×2): 5 mg via ORAL
  Filled 2020-10-07 (×2): qty 1

## 2020-10-07 MED ORDER — ACETAMINOPHEN 325 MG PO TABS: 650.0000 mg | ORAL_TABLET | Freq: Four times a day (QID) | ORAL | Status: DC | PRN

## 2020-10-07 MED ORDER — FLUOXETINE HCL 20 MG PO CAPS
40.0000 mg | ORAL_CAPSULE | Freq: Every day | ORAL | Status: DC
  Administered 2020-10-07 – 2020-10-09 (×3): 40 mg via ORAL
  Filled 2020-10-07 (×3): qty 2

## 2020-10-07 MED ORDER — POTASSIUM CHLORIDE IN NACL 20-0.9 MEQ/L-% IV SOLN
INTRAVENOUS | Status: DC
  Filled 2020-10-07 (×6): qty 1000

## 2020-10-07 MED ORDER — DIATRIZOATE MEGLUMINE & SODIUM 66-10 % PO SOLN
90.0000 mL | Freq: Once | ORAL | Status: AC
  Administered 2020-10-07: 90 mL via ORAL

## 2020-10-07 MED ORDER — HYDRALAZINE HCL 20 MG/ML IJ SOLN: 5.0000 mg | INTRAMUSCULAR | Status: DC | PRN

## 2020-10-07 MED ORDER — SODIUM CHLORIDE 0.9 % IV SOLN
8.0000 mg | Freq: Three times a day (TID) | INTRAVENOUS | Status: DC | PRN
  Administered 2020-10-07: 8 mg via INTRAVENOUS
  Filled 2020-10-07 (×3): qty 4

## 2020-10-07 MED ORDER — PANTOPRAZOLE SODIUM 40 MG IV SOLR
40.0000 mg | INTRAVENOUS | Status: DC
  Administered 2020-10-07 – 2020-10-08 (×2): 40 mg via INTRAVENOUS
  Filled 2020-10-07 (×2): qty 40

## 2020-10-07 NOTE — Progress Notes (Signed)
Remdesivir - Pharmacy Brief Note   O:  ALT: 21 CXR: N/A SpO2: 98% on RA   A/P:  Remdesivir 200 mg IVPB once followed by 100 mg IVPB daily x 4 days.   Pearla Dubonnet, PharmD Clinical Pharmacist 10/07/2020 4:56 PM

## 2020-10-07 NOTE — Progress Notes (Signed)
START ON PATHWAY REGIMEN - Uterine     A cycle is every 21 days:     Lenvatinib      Pembrolizumab   **Always confirm dose/schedule in your pharmacy ordering system**  Patient Characteristics: Endometrioid, Recurrent/Progressive Disease, Second Line, Systemic Recurrence, Relapse < 12 Months From Prior Therapy, MSS/pMMR and TMB-L/Unknown Histology: Endometrioid Therapeutic Status: Recurrent or Progressive Disease Line of Therapy: Second Line Time to Recurrence: Relapse < 12 Months From Prior Therapy Microsatellite/Mismatch Repair Status: MSS/pMMR Tumor Mutational Burden (TMB): Unknown Intent of Therapy: Non-Curative / Palliative Intent, Discussed with Patient

## 2020-10-07 NOTE — H&P (Signed)
History and Physical    Jeanette Allen WFU:[id number] DOB: 07-20-1955 DOA: 10/06/2020  PCP: Danelle Berry, NP    Patient coming from:  Home    Chief Complaint:  Abdominal pain.    HPI: Jeanette Allen is a 65 y.o. female seen in ed with complaints of abdominal pain nausea and vomiting.  Patient had a KUB earlier today which was consistent with small bowel obstruction.  Patient's abdominal pain is generalized no fevers chills.  Patient reports some generalized crampy abdominal pain with nonbloody nonbilious vomiting.  And has not had any ostomy output for about a day.  No chest pain palpitation shortness of breath rashes fevers chills urinary symptoms. Last monday she has mri and felt feverish and nauseated and few days alter she felt better than she started feeling back few days after and she thought it was the same gi bug with n/v. Spouse also had been vomiting.pt has not been able to keep anything down.   Pt has past medical history of GERD, h/o tobacco abuse.   ED Course:  Vitals:   10/06/20 1918 10/06/20 1920 10/06/20 2136 10/06/20 2200  BP: (!) 153/89  137/81 (!) 143/68  Pulse: 98  (!) 107 88  Resp: 20  19 19   Temp: 98.3 F (36.8 C)   98.2 F (36.8 C)  TempSrc: Oral   Oral  SpO2: 98%  96% 97%  Weight:  68 kg    Height:  5\' 4"  (1.626 m)    In the emergency room patient is alert awake oriented afebrile, initial KUB shows small small bowel loops positive colonic gas consistent with small bowel obstruction CT of the abdomen and pelvis shows hysterectomy, case was discussed with general surgeon on-call Dr. Lysle Pearl who recommended conservative medical management and IV fluids and will see tomorrow morning. In the emergency room patient given LR 1 L bolus, Reglan, magnesium, morphine.  Patient also found to be COVID-positive.  Review of Systems:  Review of Systems  Constitutional: Negative.   HENT: Negative.    Eyes: Negative.   Respiratory: Negative.    Cardiovascular:  Negative.   Gastrointestinal:  Positive for abdominal pain, diarrhea, nausea and vomiting.  Musculoskeletal: Negative.   Skin: Negative.   Neurological: Negative.   Endo/Heme/Allergies: Negative.   Psychiatric/Behavioral: Negative.      Past Medical History:  Diagnosis Date  . Arthritis   . Cancer ([place])   . Hypertension     Past Surgical History:  Procedure Laterality Date  . APPENDECTOMY    . COLONOSCOPY WITH PROPOFOL N/A 10/01/2019   Procedure: COLONOSCOPY WITH PROPOFOL;  Surgeon: Lin Landsman, MD;  Location: [facility] ENDOSCOPY;  Service: Gastroenterology;  Laterality: N/A;  . ESOPHAGOGASTRODUODENOSCOPY (EGD) WITH PROPOFOL N/A 09/12/2019   Procedure: ESOPHAGOGASTRODUODENOSCOPY (EGD) WITH PROPOFOL;  Surgeon: Lin Landsman, MD;  Location: [facility] ENDOSCOPY;  Service: Gastroenterology;  Laterality: N/A;     reports that she has been smoking. She has never used smokeless tobacco. She reports that she does not currently use alcohol. She reports that she does not use drugs.  No Known Allergies  Family History  Problem Relation Age of Onset  . Diabetes Maternal Grandmother   . Hypertension Maternal Grandmother   . Stroke Maternal Grandmother     Prior to Admission medications   Medication Sig Start Date End Date Taking? Authorizing Provider  acetaminophen (TYLENOL) 500 MG tablet Take by mouth.    [provider]  ergocalciferol (VITAMIN D2) 1.25 MG (50000 UT) capsule Take  by mouth. 07/09/19   [provider]  FLUoxetine (PROZAC) 40 MG capsule Take 1 capsule (40 mg total) by mouth daily. 04/16/20   Cammie Sickle, MD  lenvatinib 20 mg daily dose (LENVIMA, 20 MG DAILY DOSE,) 2 x 10 MG capsule Take 2 capsules (20 mg total) by mouth daily. 10/03/20   Cammie Sickle, MD  omeprazole (PRILOSEC) 40 MG capsule Take 1 capsule (40 mg total) by mouth 2 (two) times daily before a meal. 09/17/20   Cammie Sickle, MD  ondansetron (ZOFRAN) 8 MG tablet One  pill every 8 hours as needed for nausea/vomitting. 10/05/19   Cammie Sickle, MD  ondansetron (ZOFRAN-ODT) 4 MG disintegrating tablet Take 4 mg by mouth every 8 (eight) hours as needed for nausea or vomiting.    [provider]  prochlorperazine (COMPAZINE) 10 MG tablet Take 1 tablet (10 mg total) by mouth every 6 (six) hours as needed for nausea or vomiting. 05/07/20   Cammie Sickle, MD    Physical Exam: Vitals:   10/06/20 1918 10/06/20 1920 10/06/20 2136 10/06/20 2200  BP: (!) 153/89  137/81 (!) 143/68  Pulse: 98  (!) 107 88  Resp: 20  19 19   Temp: 98.3 F (36.8 C)   98.2 F (36.8 C)  TempSrc: Oral   Oral  SpO2: 98%  96% 97%  Weight:  68 kg    Height:  5\' 4"  (1.626 m)     Physical Exam Vitals and nursing note reviewed.  Constitutional:      General: She is not in acute distress.    Appearance: She is not ill-appearing.  HENT:     Head: Normocephalic and atraumatic.     Right Ear: External ear normal.     Left Ear: External ear normal.     Nose: Nose normal.     Mouth/Throat:     Mouth: Mucous membranes are moist.  Eyes:     Extraocular Movements: Extraocular movements intact.     Pupils: Pupils are equal, round, and reactive to light.  Cardiovascular:     Rate and Rhythm: Normal rate and regular rhythm.     Pulses: Normal pulses.     Heart sounds: Normal heart sounds.  Pulmonary:     Effort: Pulmonary effort is normal. No respiratory distress.     Breath sounds: Normal breath sounds. No wheezing.  Abdominal:     General: Bowel sounds are normal. There is no distension.     Palpations: Abdomen is soft. There is no mass.     Tenderness: There is no abdominal tenderness. There is no guarding.     Hernia: No hernia is present.  Musculoskeletal:     Right lower leg: No edema.     Left lower leg: No edema.  Skin:    General: Skin is warm.  Neurological:     General: No focal deficit present.     Mental Status: She is alert and oriented to person,  place, and time.     Cranial Nerves: No cranial nerve deficit.     Motor: No weakness.  Psychiatric:        Mood and Affect: Mood normal.        Behavior: Behavior normal.     Labs on Admission: I have personally reviewed following labs and imaging studies  No results for input(s): CKTOTAL, CKMB, TROPONINI in the last 72 hours. Lab Results  Component Value Date   WBC 9.2 10/06/2020   HGB 15.4 (  H) 10/06/2020   HCT 44.2 10/06/2020   MCV 101.1 (H) 10/06/2020   PLT 191 10/06/2020    Recent Labs  Lab 10/06/20 0915  NA 135  K 5.2*  CL 102  CO2 23  BUN 26*  CREATININE 1.38*  CALCIUM 10.7*  PROT 9.2*  BILITOT 0.8  ALKPHOS 81  ALT 22  AST 24  GLUCOSE 132*   COVID-19 Labs  No results for input(s): DDIMER, FERRITIN, LDH, CRP in the last 72 hours.  Lab Results  Component Value Date   [place] (A) 10/06/2020   [place] NEGATIVE 09/27/2019   [place] NEGATIVE 09/07/2019    Radiological Exams on Admission: CT ABDOMEN PELVIS W CONTRAST  Result Date: 10/06/2020 CLINICAL DATA:  Abdominal pain and findings suggestive of small-bowel obstruction on recent plain film examination. EXAM: CT ABDOMEN AND PELVIS WITH CONTRAST TECHNIQUE: Multidetector CT imaging of the abdomen and pelvis was performed using the standard protocol following bolus administration of intravenous contrast. CONTRAST:  35mL OMNIPAQUE IOHEXOL 350 MG/ML SOLN COMPARISON:  Plain film from earlier in the same day, PET-CT from 08/25/2020 FINDINGS: Lower chest: No acute abnormality. Hepatobiliary: Tiny dependent gallstone is noted. No wall thickening is seen. Liver is unremarkable. Pancreas: Unremarkable. No pancreatic ductal dilatation or surrounding inflammatory changes. Spleen: Normal in size without focal abnormality. Adrenals/Urinary Tract: Adrenal glands are within normal limits. Kidneys demonstrate a normal enhancement pattern bilaterally. Normal excretion of contrast is noted on delayed images. The  bladder is partially distended. 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 More proximal colon appears within normal limits. The appendix has been surgically removed. Stomach is within normal limits. Proximal jejunum appears unremarkable. Dilated loops of distal jejunum and proximal ileum are noted with air and fluid identified. No discrete transition zone is identified. There is a more gradual tapering from dilated to nondilated small bowel. This is well before the loop ileostomy. The distal ileal loops beyond the loop ileostomy are within normal limits. These changes may be related to adhesions from prior surgery. No discrete mass lesion is noted. Vascular/Lymphatic: Aortic atherosclerosis. No enlarged abdominal or pelvic lymph nodes. Reproductive: Status post hysterectomy. No adnexal masses. Other: Mild free fluid is noted within the abdomen. No discrete peritoneal deposits are seen. Musculoskeletal: Mild degenerative changes of lumbar spine are noted. IMPRESSION: Changes consistent with prior hysterectomy as well as left-sided colostomy and right-sided loop ileostomy. Some dilated fluid and air-filled loops of small bowel are noted without discrete transition zone. These changes are likely related to adhesions with mild partial small bowel obstruction. Continued follow-up is recommended. Cholelithiasis without complicating factors. Slight increase in free fluid when compared with the prior PET-CT. No discrete peritoneal implants are noted. Electronically Signed   By: Inez Catalina M.D.   On: 10/06/2020 21:33   DG Abd 2 Views  Result Date: 10/06/2020 CLINICAL DATA:  Abdominal pain, history of endometrial cancer EXAM: ABDOMEN - 2 VIEW COMPARISON:  CT chest abdomen pelvis 07/08/2020 FINDINGS: Dilated small bowel loops in the mid abdomen. Paucity of colonic gas. Findings consistent with small bowel obstruction. Ostomy RIGHT lower quadrant. No bowel wall thickening or free air.  Scattered pelvic phleboliths without urinary tract calcification. Bones demineralized. IMPRESSION: Dilated small bowel loops and paucity of colonic gas consistent with small bowel obstruction. These results will be called to the ordering clinician or representative by the Radiologist Assistant, and communication documented in the PACS or [employer]. Electronically Signed   By: Lavonia Dana M.D.   On: 10/06/2020  12:32      Assessment/Plan Principal Problem:   Nausea & vomiting Active Problems:   Abdominal pain   SBO (small bowel obstruction) (HCC)   Depression   Esophageal reflux   Hypertension   COVID-19 virus infection   Tobacco abuse   Nausea vomiting/Abdominal pain/small bowel obstruction: Patient to MedSurg with telemetry monitoring. IV PPI, as needed pain meds IV. General surgery on board.  Abdominal pain/SBO: Conservative medical management.  N.p.o. overnight Will consider NG tube if nausea vomiting worsens overnight. Maintenance IV fluids overnight.  Depression: Continue patient on Prozac.  Reflux: IV PPI. Avoid any NSAIDs Toradol ketorolac Mobic ibuprofen products.  Hypertension: Blood pressure (!) 143/68, pulse 88, temperature 98.2 F (36.8 C), temperature source Oral, resp. rate 19, height 5\' 4"  (1.626 m), weight 68 kg, SpO2 97 %. Blood pressure medications on chart. Suspect elevated blood pressures secondary to pain. Will monitor blood pressure overnight and consider as needed hydralazine low-dose to prevent bottoming out of blood pressure. Then pt states she does not take htn meds anymore.we will start low dose amlodipine   COVID-19 infection: Patient asymptomatic as far as shortness of breath cough congestion or any upper respiratory symptoms or but does report n/v/abd pain and diarrhea.  COVID isolation and droplet precautions and airborne precautions.  Tobacco abuse Nicotine patch and counseling when patient is well.    DVT prophylaxis:  scd's    Code Status:  Full code    Family Communication:  ALYSSA, ROTONDO (Spouse)  [phone] (Home Phone)   Disposition Plan:  Home    Consults called:  Gen surg dr.sokai.  Admission status: Inpatient     Para Skeans MD [facility] [phone] How to contact the [facility] Attending or Consulting provider [place] or covering provider during after hours [place], for this patient.    Check the care team in [facility] and look for a) attending/consulting [facility] provider listed and b) the [facility] team listed Log into [email or web address] and use [place]'s universal password to access. If you do not have the password, please contact the hospital operator. Locate the [facility] provider you are looking for under [facility] and page to a number that you can be directly reached. If you still have difficulty reaching the provider, please page the [facility] (Director on Call) for the Hospitalists listed on amion for assistance. [email or web address] Password [email or web address] 10/07/2020, 12:56 AM

## 2020-10-07 NOTE — Progress Notes (Signed)
ON PATHWAY REGIMEN - Ovarian  No Change  Continue With Treatment as Ordered.  Original Decision Date/Time: 09/04/2019 16:24     A cycle is every 21 days:     Paclitaxel      Carboplatin   **Always confirm dose/schedule in your pharmacy ordering system**  Patient Characteristics: Preoperative or Nonsurgical Candidate (Clinical Staging), Newly Diagnosed, Neoadjuvant Therapy followed by Surgery BRCA Mutation Status: Awaiting Test Results Therapeutic Status: Preoperative or Nonsurgical Candidate (Clinical Staging) AJCC T Category: cT3c AJCC 8 Stage Grouping: IIIC AJCC N Category: cN1b AJCC M Category: cM0 Therapy Plan: Neoadjuvant Therapy followed by Surgery Intent of Therapy: Curative Intent, Discussed with Patient

## 2020-10-07 NOTE — Progress Notes (Signed)
Subjective:  CC: Jeanette Allen is a 65 y.o. female  Hospital stay day 0,   SBO  HPI: Pt reports episodes of nausea again after SBFT contrast administration.  Despite this, she is having output from her ileostomy.  ROS:  General: Denies weight loss, weight gain, fatigue, fevers, chills, and night sweats. Heart: Denies chest pain, palpitations, racing heart, irregular heartbeat, leg pain or swelling, and decreased activity tolerance. Respiratory: Denies breathing difficulty, shortness of breath, wheezing, cough, and sputum. GI: Denies change in appetite, heartburn, nausea, vomiting, constipation, diarrhea, and blood in stool. GU: Denies difficulty urinating, pain with urinating, urgency, frequency, blood in urine.   Objective:   Temp:  [97.9 F (36.6 C)-98.3 F (36.8 C)] 97.9 F (36.6 C) (10/04 1119) Pulse Rate:  [88-111] 106 (10/04 1119) Resp:  [17-20] 18 (10/04 1119) BP: (137-160)/(68-93) 143/92 (10/04 1119) SpO2:  [96 %-98 %] 98 % (10/04 1119) Weight:  [68 kg] 68 kg (10/03 1920)     Height: 5\' 4"  (162.6 cm) Weight: 68 kg BMI (Calculated): 25.73   Intake/Output this shift:   Intake/Output Summary (Last 24 hours) at 10/07/2020 1541 Last data filed at 10/07/2020 0600 Gross per 24 hour  Intake 0 ml  Output 200 ml  Net -200 ml    Constitutional :  alert, cooperative, appears stated age, and no distress  Respiratory:  clear to auscultation bilaterally  Cardiovascular:  regular rate and rhythm  Gastrointestinal: soft, non-tender; bowel sounds normal; no masses,  no organomegaly. Ostomy productive  Skin: Cool and moist.   Psychiatric: Normal affect, non-agitated, not confused       LABS:  CMP Latest Ref Rng & Units 10/07/2020 10/06/2020 09/12/2020  Glucose 70 - 99 mg/dL 121(H) 132(H) 99  BUN 8 - 23 mg/dL 20 26(H) 22  Creatinine 0.44 - 1.00 mg/dL 1.05(H) 1.38(H) 1.06(H)  Sodium 135 - 145 mmol/L 136 135 137  Potassium 3.5 - 5.1 mmol/L 4.0 5.2(H) 4.5  Chloride 98 - 111 mmol/L  104 102 107  CO2 22 - 32 mmol/L 22 23 24   Calcium 8.9 - 10.3 mg/dL 10.1 10.7(H) 9.7  Total Protein 6.5 - 8.1 g/dL 7.6 9.2(H) 7.8  Total Bilirubin 0.3 - 1.2 mg/dL 1.8(H) 0.8 0.5  Alkaline Phos 38 - 126 U/L 70 81 79  AST 15 - 41 U/L 24 24 29   ALT 0 - 44 U/L 21 22 35   CBC Latest Ref Rng & Units 10/07/2020 10/06/2020 09/12/2020  WBC 4.0 - 10.5 K/uL 9.9 9.2 4.9  Hemoglobin 12.0 - 15.0 g/dL 13.3 15.4(H) 12.9  Hematocrit 36.0 - 46.0 % 37.0 44.2 37.8  Platelets 150 - 400 K/uL 172 191 134(L)    RADS: CLINICAL DATA:  65 year old female with possible small-bowel obstruction on CT 2 days ago.   EXAM: PORTABLE ABDOMEN - 1 VIEW   COMPARISON:  CT Abdomen and Pelvis 10/06/2020 and earlier.   FINDINGS: Right lower quadrant ileostomy on the comparison CT. Portable AP supine view at 0902 hours. Oral contrast appears to be present throughout the small bowel now to the level of the ileostomy. Right lower quadrant loops remain relatively decompressed as before. Excreted IV contrast also in the urinary bladder. Incidental pelvic phleboliths. There is some residual oral contrast within the stomach. Stable visualized osseous structures. Right lung base appears negative.   IMPRESSION: Evidence that oral contrast has reached the right lower quadrant ileostomy on this image suggesting an incomplete or resolving bowel obstruction.     Electronically Signed   By:  Genevie Ann M.D.   On: 10/07/2020 09:33 Assessment:   pSBO, resolving based on imaging and productive ileostomy but patient has persistent nausea and emesis.  This can potentially be due to her recent COVID infection.  Start with clears and continue to monitor.  UPDATE: by time this note was written, patient advanced to regular diet by primary.  If she is tolerating diet, no further surgical intervention needed so will sign off.  Please call with questions.  Time spent for this encounter >38min.

## 2020-10-07 NOTE — Progress Notes (Signed)
PROGRESS NOTE    Jeanette Allen   XHB:[id number]  DOB: [birth year]  DOA: 10/06/2020 PCP: Jeanette Berry, NP   Brief Narrative:  Jeanette Allen is a 65 year old female endometrial cancer who has a colostomy and has a history of hepatitis C, ho presents to the hospital for abdominal pain nausea and vomiting.  She is found to be COVID-positive and states that her husband is COVID-positive as well. In the ED, a CT scan of the abdomen and pelvis was performed for her GI symptoms and revealed dilated loops of distal jejunum and proximal ileum with air-fluid levels. General surgery was consulted    Subjective: She continues to have extensive nausea and vomiting today.  Output in colostomy is very watery.  She has some mid abdominal pain.    Assessment & Plan:   Principal Problem:   Nausea & vomiting  -Possibly related to COVID-19 infection -Continue IV fluids & symptomatic treatment a -X-rays today reveal contrast to be reaching the colon  Active Problems: COVID-19 infection - Can start remdesivir as this may help with her GI symptoms  Mildly elevated creatinine - It appears that her baseline is around 0.8-0.95 - Creatinine rose to 1.38 yesterday and appears to be improving - This is all likely prerenal-continue IV fluids  High-grade endometrial cancer - Being treated with chemotherapy  Carboplatin & Paclitaxel Q 21 days x 6 cycles    Depression -Continue fluoxetine  GERD - Continue PPI   Time spent in minutes: 35 DVT prophylaxis: heparin injection 5,000 Units Start: 10/07/20 0200 SCDs Start: 10/07/20 0101  Code Status: Full code Family Communication:  Level of Care: Level of care: Med-Surg Disposition Plan:  Status is: Inpatient  Remains inpatient appropriate because:IV treatments appropriate due to intensity of illness or inability to take PO  Dispo: The patient is from: Home              Anticipated d/c is to: Home              Patient currently is not  medically stable to d/c.   Difficult to place patient No      Consultants:  General surgery Procedures:  None Antimicrobials:  Anti-infectives (From admission, onward)    None        Objective: Vitals:   10/07/20 0418 10/07/20 0500 10/07/20 0823 10/07/20 1119  BP: (!) 160/93  (!) 147/85 (!) 143/92  Pulse: (!) 111 90 (!) 105 (!) 106  Resp: 18 17 20 18   Temp: 98.2 F (36.8 C)  98.2 F (36.8 C) 97.9 F (36.6 C)  TempSrc: Oral  Oral Oral  SpO2: 98% 98% 98% 98%  Weight:      Height:        Intake/Output Summary (Last 24 hours) at 10/07/2020 1618 Last data filed at 10/07/2020 0600 Gross per 24 hour  Intake 0 ml  Output 200 ml  Net -200 ml   Filed Weights   10/06/20 1920  Weight: 68 kg    Examination: General exam: Appears comfortable  HEENT: PERRLA, oral mucosa moist, no sclera icterus or thrush Respiratory system: Clear to auscultation. Respiratory effort normal. Cardiovascular system: S1 & S2 heard, RRR.   Gastrointestinal system: Abdomen soft, tender in epigastrium, nondistended. Normal bowel sounds.  Liquid, watery stool present in colostomy bag Central nervous system: Alert and oriented. No focal neurological deficits. Extremities: No cyanosis, clubbing or edema Skin: No rashes or ulcers Psychiatry:  Mood & affect appropriate.     Data Reviewed:  I have personally reviewed following labs and imaging studies  CBC: Recent Labs  Lab 10/06/20 0915 10/07/20 0656  WBC 9.2 9.9  NEUTROABS 7.4  --   HGB 15.4* 13.3  HCT 44.2 37.0  MCV 101.1* 100.8*  PLT 191 458   Basic Metabolic Panel: Recent Labs  Lab 10/06/20 0915 10/07/20 0656  NA 135 136  K 5.2* 4.0  CL 102 104  CO2 23 22  GLUCOSE 132* 121*  BUN 26* 20  CREATININE 1.38* 1.05*  CALCIUM 10.7* 10.1  MG 1.9 2.0   GFR: Estimated Creatinine Clearance: 49.9 mL/min (A) (by C-G formula based on SCr of 1.05 mg/dL (H)). Liver Function Tests: Recent Labs  Lab 10/06/20 0915 10/07/20 0656  AST  24 24  ALT 22 21  ALKPHOS 81 70  BILITOT 0.8 1.8*  PROT 9.2* 7.6  ALBUMIN 4.6 3.9   No results for input(s): LIPASE, AMYLASE in the last 168 hours. No results for input(s): AMMONIA in the last 168 hours. Coagulation Profile: No results for input(s): INR, PROTIME in the last 168 hours. Cardiac Enzymes: No results for input(s): CKTOTAL, CKMB, CKMBINDEX, TROPONINI in the last 168 hours. BNP (last 3 results) No results for input(s): PROBNP in the last 8760 hours. HbA1C: No results for input(s): HGBA1C in the last 72 hours. CBG: No results for input(s): GLUCAP in the last 168 hours. Lipid Profile: No results for input(s): CHOL, HDL, LDLCALC, TRIG, CHOLHDL, LDLDIRECT in the last 72 hours. Thyroid Function Tests: No results for input(s): TSH, T4TOTAL, FREET4, T3FREE, THYROIDAB in the last 72 hours. Anemia Panel: No results for input(s): VITAMINB12, FOLATE, FERRITIN, TIBC, IRON, RETICCTPCT in the last 72 hours. Urine analysis:    Component Value Date/Time   COLORURINE Yellow 09/25/2012 1546   APPEARANCEUR Clear 09/25/2012 1546   LABSPEC 1.008 09/25/2012 1546   PHURINE 6.0 09/25/2012 1546   GLUCOSEU Negative 09/25/2012 1546   HGBUR Negative 09/25/2012 1546   BILIRUBINUR Negative 09/25/2012 1546   KETONESUR Negative 09/25/2012 1546   PROTEINUR Negative 09/25/2012 1546   NITRITE Negative 09/25/2012 1546   LEUKOCYTESUR Negative 09/25/2012 1546   Sepsis Labs: @LABRCNTIP (procalcitonin:4,lacticidven:4) ) Recent Results (from the past 240 hour(s))  Resp Panel by RT-PCR (Flu A&B, Covid) Nasopharyngeal Swab     Status: Abnormal   Collection Time: 10/06/20  9:40 PM   Specimen: Nasopharyngeal Swab; Nasopharyngeal(NP) swabs in vial transport medium  Result Value Ref Range Status   SARS Coronavirus 2 by RT PCR POSITIVE (A) NEGATIVE Final    Comment: RESULT CALLED TO, READ BACK BY AND VERIFIED WITH: Ammi Watts @2240  on 10/06/20 skl (NOTE) SARS-CoV-2 target nucleic acids are  DETECTED.  The SARS-CoV-2 RNA is generally detectable in upper respiratory specimens during the acute phase of infection. Positive results are indicative of the presence of the identified virus, but do not rule out bacterial infection or co-infection with other pathogens not detected by the test. Clinical correlation with patient history and other diagnostic information is necessary to determine patient infection status. The expected result is Negative.  Fact Sheet for Patients: EntrepreneurPulse.com.au  Fact Sheet for Healthcare Providers: IncredibleEmployment.be  This test is not yet approved or cleared by the [place] FDA and  has been authorized for detection and/or diagnosis of SARS-CoV-2 by FDA under an Emergency Use Authorization (EUA).  This EUA will remain in effect (meaning this test can be u sed) for the duration of  the COVID-19 declaration under Section 564(b)(1) of the Act, 21 U.S.C. section 360bbb-3(b)(1), unless the  authorization is terminated or revoked sooner.     Influenza A by PCR NEGATIVE NEGATIVE Final   Influenza B by PCR NEGATIVE NEGATIVE Final    Comment: (NOTE) The Xpert Xpress SARS-CoV-2/FLU/RSV plus assay is intended as an aid in the diagnosis of influenza from Nasopharyngeal swab specimens and should not be used as a sole basis for treatment. Nasal washings and aspirates are unacceptable for Xpert Xpress SARS-CoV-2/FLU/RSV testing.  Fact Sheet for Patients: EntrepreneurPulse.com.au  Fact Sheet for Healthcare Providers: IncredibleEmployment.be  This test is not yet approved or cleared by the [place] FDA and has been authorized for detection and/or diagnosis of SARS-CoV-2 by FDA under an Emergency Use Authorization (EUA). This EUA will remain in effect (meaning this test can be used) for the duration of the COVID-19 declaration under Section 564(b)(1) of the Act, 21  U.S.C. section 360bbb-3(b)(1), unless the authorization is terminated or revoked.  Performed at [facility], [address] [place], [place] [address]          Radiology Studies: CT ABDOMEN PELVIS W CONTRAST  Result Date: 10/06/2020 CLINICAL DATA:  Abdominal pain and findings suggestive of small-bowel obstruction on recent plain film examination. EXAM: CT ABDOMEN AND PELVIS WITH CONTRAST TECHNIQUE: Multidetector CT imaging of the abdomen and pelvis was performed using the standard protocol following bolus administration of intravenous contrast. CONTRAST:  32mL OMNIPAQUE IOHEXOL 350 MG/ML SOLN COMPARISON:  Plain film from earlier in the same day, PET-CT from 08/25/2020 FINDINGS: Lower chest: No acute abnormality. Hepatobiliary: Tiny dependent gallstone is noted. No wall thickening is seen. Liver is unremarkable. Pancreas: Unremarkable. No pancreatic ductal dilatation or surrounding inflammatory changes. Spleen: Normal in size without focal abnormality. Adrenals/Urinary Tract: Adrenal glands are within normal limits. Kidneys demonstrate a normal enhancement pattern bilaterally. Normal excretion of contrast is noted on delayed images. The bladder is partially distended. 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 More proximal colon appears within normal limits. The appendix has been surgically removed. Stomach is within normal limits. Proximal jejunum appears unremarkable. Dilated loops of distal jejunum and proximal ileum are noted with air and fluid identified. No discrete transition zone is identified. There is a more gradual tapering from dilated to nondilated small bowel. This is well before the loop ileostomy. The distal ileal loops beyond the loop ileostomy are within normal limits. These changes may be related to adhesions from prior surgery. No discrete mass lesion is noted. Vascular/Lymphatic: Aortic atherosclerosis. No enlarged abdominal or pelvic  lymph nodes. Reproductive: Status post hysterectomy. No adnexal masses. Other: Mild free fluid is noted within the abdomen. No discrete peritoneal deposits are seen. Musculoskeletal: Mild degenerative changes of lumbar spine are noted. IMPRESSION: Changes consistent with prior hysterectomy as well as left-sided colostomy and right-sided loop ileostomy. Some dilated fluid and air-filled loops of small bowel are noted without discrete transition zone. These changes are likely related to adhesions with mild partial small bowel obstruction. Continued follow-up is recommended. Cholelithiasis without complicating factors. Slight increase in free fluid when compared with the prior PET-CT. No discrete peritoneal implants are noted. Electronically Signed   By: Inez Catalina M.D.   On: 10/06/2020 21:33   DG Abd 2 Views  Result Date: 10/06/2020 CLINICAL DATA:  Abdominal pain, history of endometrial cancer EXAM: ABDOMEN - 2 VIEW COMPARISON:  CT chest abdomen pelvis 07/08/2020 FINDINGS: Dilated small bowel loops in the mid abdomen. Paucity of colonic gas. Findings consistent with small bowel obstruction. Ostomy RIGHT lower quadrant. No bowel wall thickening  or free air. Scattered pelvic phleboliths without urinary tract calcification. Bones demineralized. IMPRESSION: Dilated small bowel loops and paucity of colonic gas consistent with small bowel obstruction. These results will be called to the ordering clinician or representative by the Radiologist Assistant, and communication documented in the PACS or [employer]. Electronically Signed   By: Lavonia Dana M.D.   On: 10/06/2020 12:32   DG Abd Portable 1V-Small Bowel Obstruction Protocol-initial, 8 hr delay  Result Date: 10/07/2020 CLINICAL DATA:  65 year old female with possible small-bowel obstruction on CT 2 days ago. EXAM: PORTABLE ABDOMEN - 1 VIEW COMPARISON:  CT Abdomen and Pelvis 10/06/2020 and earlier. FINDINGS: Right lower quadrant ileostomy on the comparison  CT. Portable AP supine view at 0902 hours. Oral contrast appears to be present throughout the small bowel now to the level of the ileostomy. Right lower quadrant loops remain relatively decompressed as before. Excreted IV contrast also in the urinary bladder. Incidental pelvic phleboliths. There is some residual oral contrast within the stomach. Stable visualized osseous structures. Right lung base appears negative. IMPRESSION: Evidence that oral contrast has reached the right lower quadrant ileostomy on this image suggesting an incomplete or resolving bowel obstruction. Electronically Signed   By: Genevie Ann M.D.   On: 10/07/2020 09:33      Scheduled Meds:  amLODipine  5 mg Oral Daily   FLUoxetine  40 mg Oral Daily   heparin  5,000 Units Subcutaneous Q8H   pantoprazole (PROTONIX) IV  40 mg Intravenous Q24H   prochlorperazine  10 mg Intravenous Q6H   Continuous Infusions:  0.9 % NaCl with KCl 20 mEq / L 100 mL/hr at 10/07/20 1247     LOS: 0 days      Debbe Odea, MD [facility] Pager: [email or web address] 10/07/2020, 4:18 PM

## 2020-10-07 NOTE — Consult Note (Signed)
Subjective:   CC: pSBO  HPI:  Jeanette Allen is a 65 y.o. female who was consulted by Tamala Julian for issue above.  Symptoms were first noted 2 days ago. Pain is crampy, entire abdomen.  Associated with N/V, exacerbated by nothing specifc.  Last ileostomy output one day ago, usually fills up bag by now.  Hx of endometrial cancer, undergoing chemo.  S/p hysterectomy, LAR, Loop ileostomy and end mucus colostomy     Past Medical History:  has a past medical history of Arthritis, Cancer ([place]), and Hypertension.  Past Surgical History:  Past Surgical History:  Procedure Laterality Date   APPENDECTOMY     COLONOSCOPY WITH PROPOFOL N/A 10/01/2019   Procedure: COLONOSCOPY WITH PROPOFOL;  Surgeon: Lin Landsman, MD;  Location: [facility] ENDOSCOPY;  Service: Gastroenterology;  Laterality: N/A;   ESOPHAGOGASTRODUODENOSCOPY (EGD) WITH PROPOFOL N/A 09/12/2019   Procedure: ESOPHAGOGASTRODUODENOSCOPY (EGD) WITH PROPOFOL;  Surgeon: Lin Landsman, MD;  Location: [facility] ENDOSCOPY;  Service: Gastroenterology;  Laterality: N/A;    Family History: family history includes Diabetes in her maternal grandmother; Hypertension in her maternal grandmother; Stroke in her maternal grandmother.  Social History:  reports that she has been smoking. She has never used smokeless tobacco. She reports that she does not currently use alcohol. She reports that she does not use drugs.  Current Medications:  Prior to Admission medications   Medication Sig Start Date End Date Taking? Authorizing Provider  acetaminophen (TYLENOL) 500 MG tablet Take by mouth.    [provider]  ergocalciferol (VITAMIN D2) 1.25 MG (50000 UT) capsule Take by mouth. 07/09/19   [provider]  FLUoxetine (PROZAC) 40 MG capsule Take 1 capsule (40 mg total) by mouth daily. 04/16/20   Cammie Sickle, MD  lenvatinib 20 mg daily dose (LENVIMA, 20 MG DAILY DOSE,) 2 x 10 MG capsule Take 2 capsules (20 mg total) by mouth daily. 10/03/20    Cammie Sickle, MD  omeprazole (PRILOSEC) 40 MG capsule Take 1 capsule (40 mg total) by mouth 2 (two) times daily before a meal. 09/17/20   Cammie Sickle, MD  ondansetron (ZOFRAN) 8 MG tablet One pill every 8 hours as needed for nausea/vomitting. 10/05/19   Cammie Sickle, MD  ondansetron (ZOFRAN-ODT) 4 MG disintegrating tablet Take 4 mg by mouth every 8 (eight) hours as needed for nausea or vomiting.    [provider]  prochlorperazine (COMPAZINE) 10 MG tablet Take 1 tablet (10 mg total) by mouth every 6 (six) hours as needed for nausea or vomiting. 05/07/20   Cammie Sickle, MD    Allergies:  Allergies as of 10/06/2020   (No Known Allergies)    ROS:  General: Denies weight loss, weight gain, fatigue, fevers, chills, and night sweats. Eyes: Denies blurry vision, double vision, eye pain, itchy eyes, and tearing. Ears: Denies hearing loss, earache, and ringing in ears. Nose: Denies sinus pain, congestion, infections, runny nose, and nosebleeds. Mouth/throat: Denies hoarseness, sore throat, bleeding gums, and difficulty swallowing. Heart: Denies chest pain, palpitations, racing heart, irregular heartbeat, leg pain or swelling, and decreased activity tolerance. Respiratory: Denies breathing difficulty, shortness of breath, wheezing, cough, and sputum. GI: Denies change in appetite, heartburn, constipation, diarrhea, and blood in stool. GU: Denies difficulty urinating, pain with urinating, urgency, frequency, blood in urine. Musculoskeletal: Denies joint stiffness, pain, swelling, muscle weakness. Skin: Denies rash, itching, mass, tumors, sores, and boils Neurologic: Denies headache, fainting, dizziness, seizures, numbness, and tingling. Psychiatric: Denies depression, anxiety, difficulty sleeping, and  memory loss. Endocrine: Denies heat or cold intolerance, and increased thirst or urination. Blood/lymph: Denies easy bruising, easy bruising, and swollen  glands     Objective:     BP (!) 143/68 (BP Location: Left Arm)   Pulse 88   Temp 98.2 F (36.8 C) (Oral)   Resp 19   Ht 5\' 4"  (1.626 m)   Wt 68 kg   SpO2 97%   BMI 25.75 kg/m   Constitutional :  alert, cooperative, appears stated age, and no distress  Lymphatics/Throat:  no asymmetry, masses, or scars  Respiratory:  clear to auscultation bilaterally  Cardiovascular:  regular rate and rhythm  Gastrointestinal: Soft, no guarding, minimal distention. Ileostomy with pink mucosa, patent lumens in both limbs.  Mucus colostomy present as well .   Musculoskeletal: Steady movement  Skin: Cool and moist, midline surgical scars   Psychiatric: Normal affect, non-agitated, not confused       LABS:  CMP Latest Ref Rng & Units 10/06/2020 09/12/2020 08/29/2020  Glucose 70 - 99 mg/dL 132(H) 99 101(H)  BUN 8 - 23 mg/dL 26(H) 22 15  Creatinine 0.44 - 1.00 mg/dL 1.38(H) 1.06(H) 0.95  Sodium 135 - 145 mmol/L 135 137 136  Potassium 3.5 - 5.1 mmol/L 5.2(H) 4.5 4.2  Chloride 98 - 111 mmol/L 102 107 107  CO2 22 - 32 mmol/L 23 24 23   Calcium 8.9 - 10.3 mg/dL 10.7(H) 9.7 9.8  Total Protein 6.5 - 8.1 g/dL 9.2(H) 7.8 7.8  Total Bilirubin 0.3 - 1.2 mg/dL 0.8 0.5 0.4  Alkaline Phos 38 - 126 U/L 81 79 91  AST 15 - 41 U/L 24 29 26   ALT 0 - 44 U/L 22 35 31   CBC Latest Ref Rng & Units 10/06/2020 09/12/2020 08/29/2020  WBC 4.0 - 10.5 K/uL 9.2 4.9 4.4  Hemoglobin 12.0 - 15.0 g/dL 15.4(H) 12.9 13.0  Hematocrit 36.0 - 46.0 % 44.2 37.8 37.7  Platelets 150 - 400 K/uL 191 134(L) 138(L)    RADS: CLINICAL DATA:  Abdominal pain and findings suggestive of small-bowel obstruction on recent plain film examination.   EXAM: CT ABDOMEN AND PELVIS WITH CONTRAST   TECHNIQUE: Multidetector CT imaging of the abdomen and pelvis was performed using the standard protocol following bolus administration of intravenous contrast.   CONTRAST:  46mL OMNIPAQUE IOHEXOL 350 MG/ML SOLN   COMPARISON:  Plain film from earlier  in the same day, PET-CT from 08/25/2020   FINDINGS: Lower chest: No acute abnormality.   Hepatobiliary: Tiny dependent gallstone is noted. No wall thickening is seen. Liver is unremarkable.   Pancreas: Unremarkable. No pancreatic ductal dilatation or surrounding inflammatory changes.   Spleen: Normal in size without focal abnormality.   Adrenals/Urinary Tract: Adrenal glands are within normal limits. Kidneys demonstrate a normal enhancement pattern bilaterally. Normal excretion of contrast is noted on delayed images. The bladder is partially distended.   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 More proximal colon appears within normal limits. The appendix has been surgically removed. Stomach is within normal limits. Proximal jejunum appears unremarkable. Dilated loops of distal jejunum and proximal ileum are noted with air and fluid identified. No discrete transition zone is identified. There is a more gradual tapering from dilated to nondilated small bowel. This is well before the loop ileostomy. The distal ileal loops beyond the loop ileostomy are within normal limits. These changes may be related to adhesions from prior surgery. No discrete mass lesion is noted.   Vascular/Lymphatic: Aortic atherosclerosis. No  enlarged abdominal or pelvic lymph nodes.   Reproductive: Status post hysterectomy. No adnexal masses.   Other: Mild free fluid is noted within the abdomen. No discrete peritoneal deposits are seen.   Musculoskeletal: Mild degenerative changes of lumbar spine are noted.   IMPRESSION: Changes consistent with prior hysterectomy as well as left-sided colostomy and right-sided loop ileostomy.   Some dilated fluid and air-filled loops of small bowel are noted without discrete transition zone. These changes are likely related to adhesions with mild partial small bowel obstruction. Continued follow-up is recommended.    Cholelithiasis without complicating factors.   Slight increase in free fluid when compared with the prior PET-CT. No discrete peritoneal implants are noted.     Electronically Signed   By: Inez Catalina M.D.   On: 10/06/2020 21:33 Assessment:   pSBO? Incidental positive COVID Hx of endometrial CA on chemo  Plan:    Exam is very benign at this time, with patient stating pain has resolved.  Decreased output from ileostomy is concerning but CT images reviewed personally and agree there is no discrete transition point.  Will order SBFT to see if contrast extends to ileostomy.  Asympotmatic, so can hold off on NG tube placement for now.  Discussed how majority of these cases resolve without surgical intervention, so can monitor for now but will need to consider transfer back to [facility] due to her complicated surgical history if surgery maybe needed to resolve.  Incidental covid and further medical management per primary in the meantime.

## 2020-10-08 ENCOUNTER — Other Ambulatory Visit: Payer: Self-pay | Admitting: Obstetrics and Gynecology

## 2020-10-08 ENCOUNTER — Inpatient Hospital Stay: Payer: Medicare Other

## 2020-10-08 LAB — BASIC METABOLIC PANEL
Anion gap: 7 (ref 5–15)
BUN: 22 mg/dL (ref 8–23)
CO2: 23 mmol/L (ref 22–32)
Calcium: 8.8 mg/dL — ABNORMAL LOW (ref 8.9–10.3)
Chloride: 104 mmol/L (ref 98–111)
Creatinine, Ser: 0.97 mg/dL (ref 0.44–1.00)
GFR, Estimated: >60 mL/min (ref >60)
Glucose, Bld: 120 mg/dL — ABNORMAL HIGH (ref 70–99)
Potassium: 3.9 mmol/L (ref 3.5–5.1)
Sodium: 134 mmol/L — ABNORMAL LOW (ref 135–145)

## 2020-10-08 MED ORDER — PNEUMOCOCCAL VAC POLYVALENT 25 MCG/0.5ML IJ INJ: 0.5000 mL | INJECTION | INTRAMUSCULAR | Status: DC

## 2020-10-08 MED ORDER — METOCLOPRAMIDE HCL 5 MG/ML IJ SOLN
5.0000 mg | Freq: Three times a day (TID) | INTRAMUSCULAR | Status: DC
  Administered 2020-10-08 – 2020-10-09 (×3): 5 mg via INTRAVENOUS
  Filled 2020-10-08 (×3): qty 2

## 2020-10-08 MED ORDER — PROCHLORPERAZINE EDISYLATE 10 MG/2ML IJ SOLN
10.0000 mg | Freq: Four times a day (QID) | INTRAMUSCULAR | Status: DC | PRN
  Administered 2020-10-08 – 2020-10-09 (×2): 10 mg via INTRAVENOUS
  Filled 2020-10-08 (×3): qty 2

## 2020-10-08 MED ORDER — AMLODIPINE BESYLATE 10 MG PO TABS
10.0000 mg | ORAL_TABLET | Freq: Every day | ORAL | Status: DC
  Filled 2020-10-08: qty 1

## 2020-10-08 MED ORDER — ENOXAPARIN SODIUM 40 MG/0.4ML IJ SOSY
40.0000 mg | PREFILLED_SYRINGE | INTRAMUSCULAR | Status: DC
  Administered 2020-10-08: 20:00:00 40 mg via SUBCUTANEOUS
  Filled 2020-10-08: qty 0.4

## 2020-10-08 MED ORDER — PANTOPRAZOLE SODIUM 40 MG PO TBEC
40.0000 mg | DELAYED_RELEASE_TABLET | Freq: Two times a day (BID) | ORAL | Status: DC
  Administered 2020-10-08 – 2020-10-09 (×2): 40 mg via ORAL
  Filled 2020-10-08 (×2): qty 1

## 2020-10-08 MED ORDER — FLUOXETINE HCL 20 MG PO CAPS: 20.0000 mg | ORAL_CAPSULE | Freq: Every day | ORAL | Status: DC

## 2020-10-08 NOTE — Progress Notes (Signed)
Jeanette Allen  KXF:[id number] DOB: [birth year] DOA: 10/06/2020 PCP: Danelle Berry, NP    Brief Narrative:  68CV with a history of endometrial cancer, hepatitis C, and colostomy who presented to the hospital with severe abdominal pain nausea and vomiting.  She was found to be COVID-positive at the time of her presentation.  In the ED a CT of the abdomen/pelvis revealed dilated loops of distal jejunum and proximal ileum with air-fluid levels c/w a pSBO.  Disposition: Anticipate discharge home  Date of Positive COVID Test:  10/06/20  COVID-19 specific Treatment: Remdesivir 10/4 > 10/5  Consultants:  General Surgery  Code Status: FULL CODE  Antimicrobials:  None  DVT prophylaxis: Lovenox  Subjective: Afebrile.  Vital signs stable with blood pressure modestly elevated.  Saturations 100% on room air.  Potassium 3.9 with magnesium 2.0.  Still experiencing unrelenting severe peroxisomal's of nausea which is preventing her from being able to eat or drink.  No abdominal pain.  No shortness of breath fevers or chills.  Assessment & Plan:  Partial small bowel obstruction - nausea and vomiting General surgery has evaluated - resolving with ileostomy production and improving imaging -repeat imaging in a.m. -short trial of scheduled Reglan -continue as needed Zofran and backup Compazine  COVID-19 infection May be contributing to her nausea and vomiting -discontinue Remdesivir as nausea is a potential side effect and she otherwise is not likely benefiting from this medication  Acute kidney injury Baseline creatinine approximately 0.85  High-grade endometrial cancer Currently undergoing chemotherapy with carboplatin and paclitaxel  Depression Continue fluoxetine  GERD Continue home medical therapy  Family Communication:  Status is: Inpatient  Remains inpatient appropriate because:IV treatments appropriate due to intensity of illness or inability to take PO  Dispo: The  patient is from: Home              Anticipated d/c is to: Home              Patient currently is not medically stable to d/c.   Difficult to place patient No    Objective: Blood pressure (!) 151/80, pulse 98, temperature 98.5 F (36.9 C), temperature source Oral, resp. rate 16, height 5\' 4"  (1.626 m), weight 68 kg, SpO2 100 %.  Intake/Output Summary (Last 24 hours) at 10/08/2020 1235 Last data filed at 10/08/2020 0600 Gross per 24 hour  Intake 1625.94 ml  Output 1100 ml  Net 525.94 ml   Filed Weights   10/06/20 1920  Weight: 68 kg    Examination: General: No acute respiratory distress Lungs: Clear to auscultation bilaterally without wheezes or crackles Cardiovascular: Regular rate and rhythm without murmur gallop or rub normal S1 and S2 Abdomen: Nontender, nondistended, soft, bowel sounds positive, no rebound, no ascites, no appreciable mass Extremities: No significant cyanosis, clubbing, or edema bilateral lower extremities  CBC: Recent Labs  Lab 10/06/20 0915 10/07/20 0656  WBC 9.2 9.9  NEUTROABS 7.4  --   HGB 15.4* 13.3  HCT 44.2 37.0  MCV 101.1* 100.8*  PLT 191 893   Basic Metabolic Panel: Recent Labs  Lab 10/06/20 0915 10/07/20 0656 10/08/20 0452  NA 135 136 134*  K 5.2* 4.0 3.9  CL 102 104 104  CO2 23 22 23   GLUCOSE 132* 121* 120*  BUN 26* 20 22  CREATININE 1.38* 1.05* 0.97  CALCIUM 10.7* 10.1 8.8*  MG 1.9 2.0  --    GFR: Estimated Creatinine Clearance: 54 mL/min (by C-G formula based on SCr of 0.97 mg/dL).  Liver Function Tests: Recent Labs  Lab 10/06/20 0915 10/07/20 0656  AST 24 24  ALT 22 21  ALKPHOS 81 70  BILITOT 0.8 1.8*  PROT 9.2* 7.6  ALBUMIN 4.6 3.9     Recent Results (from the past 240 hour(s))  Resp Panel by RT-PCR (Flu A&B, Covid) Nasopharyngeal Swab     Status: Abnormal   Collection Time: 10/06/20  9:40 PM   Specimen: Nasopharyngeal Swab; Nasopharyngeal(NP) swabs in vial transport medium  Result Value Ref Range Status    SARS Coronavirus 2 by RT PCR POSITIVE (A) NEGATIVE Final    Comment: RESULT CALLED TO, READ BACK BY AND VERIFIED WITH: Ammi Watts @2240  on 10/06/20 skl (NOTE) SARS-CoV-2 target nucleic acids are DETECTED.  The SARS-CoV-2 RNA is generally detectable in upper respiratory specimens during the acute phase of infection. Positive results are indicative of the presence of the identified virus, but do not rule out bacterial infection or co-infection with other pathogens not detected by the test. Clinical correlation with patient history and other diagnostic information is necessary to determine patient infection status. The expected result is Negative.  Fact Sheet for Patients: EntrepreneurPulse.com.au  Fact Sheet for Healthcare Providers: IncredibleEmployment.be  This test is not yet approved or cleared by the [place] FDA and  has been authorized for detection and/or diagnosis of SARS-CoV-2 by FDA under an Emergency Use Authorization (EUA).  This EUA will remain in effect (meaning this test can be u sed) for the duration of  the COVID-19 declaration under Section 564(b)(1) of the Act, 21 U.S.C. section 360bbb-3(b)(1), unless the authorization is terminated or revoked sooner.     Influenza A by PCR NEGATIVE NEGATIVE Final   Influenza B by PCR NEGATIVE NEGATIVE Final    Comment: (NOTE) The Xpert Xpress SARS-CoV-2/FLU/RSV plus assay is intended as an aid in the diagnosis of influenza from Nasopharyngeal swab specimens and should not be used as a sole basis for treatment. Nasal washings and aspirates are unacceptable for Xpert Xpress SARS-CoV-2/FLU/RSV testing.  Fact Sheet for Patients: EntrepreneurPulse.com.au  Fact Sheet for Healthcare Providers: IncredibleEmployment.be  This test is not yet approved or cleared by the [place] FDA and has been authorized for detection and/or diagnosis of SARS-CoV-2  by FDA under an Emergency Use Authorization (EUA). This EUA will remain in effect (meaning this test can be used) for the duration of the COVID-19 declaration under Section 564(b)(1) of the Act, 21 U.S.C. section 360bbb-3(b)(1), unless the authorization is terminated or revoked.  Performed at [facility], [address] [place], [place] [address]      Scheduled Meds:  amLODipine  5 mg Oral Daily   FLUoxetine  40 mg Oral Daily   heparin  5,000 Units Subcutaneous Q8H   pantoprazole (PROTONIX) IV  40 mg Intravenous Q24H   [START ON 10/09/2020] pneumococcal 23 valent vaccine  0.5 mL Intramuscular Tomorrow-1000   prochlorperazine  10 mg Intravenous Q6H   Continuous Infusions:  0.9 % NaCl with KCl 20 mEq / L 100 mL/hr at 10/08/20 0831   remdesivir 100 mg in NS 100 mL 100 mg (10/08/20 0830)     LOS: 1 day   Cherene Altes, MD [facility] Office  [phone] Pager - Text Page per Shea Evans  If 7PM-7AM, please contact night-coverage per Amion 10/08/2020, 12:35 PM

## 2020-10-08 NOTE — Plan of Care (Signed)

## 2020-10-08 NOTE — Progress Notes (Signed)
Pharmacist Chemotherapy Monitoring - Initial Assessment    Anticipated start date: 10/15/20   The following has been reviewed per standard work regarding the patient's treatment regimen: The patient's diagnosis, treatment plan and drug doses, and organ/hematologic function Lab orders and baseline tests specific to treatment regimen  The treatment plan start date, drug sequencing, and pre-medications Prior authorization status  Patient's documented medication list, including drug-drug interaction screen and prescriptions for anti-emetics and supportive care specific to the treatment regimen The drug concentrations, fluid compatibility, administration routes, and timing of the medications to be used The patient's access for treatment and lifetime cumulative dose history, if applicable  The patient's medication allergies and previous infusion related reactions, if applicable   Changes made to treatment plan:  treatment plan date  Follow up needed:  signing treatment plan   Adelina Mings, [place], 10/08/2020  11:28 AM

## 2020-10-09 ENCOUNTER — Inpatient Hospital Stay: Payer: Medicare Other

## 2020-10-09 LAB — COMPREHENSIVE METABOLIC PANEL
ALT: 24 U/L (ref 0–44)
AST: 27 U/L (ref 15–41)
Albumin: 3.2 g/dL — ABNORMAL LOW (ref 3.5–5.0)
Alkaline Phosphatase: 64 U/L (ref 38–126)
Anion gap: 6 (ref 5–15)
BUN: 16 mg/dL (ref 8–23)
CO2: 23 mmol/L (ref 22–32)
Calcium: 8.1 mg/dL — ABNORMAL LOW (ref 8.9–10.3)
Chloride: 108 mmol/L (ref 98–111)
Creatinine, Ser: 0.81 mg/dL (ref 0.44–1.00)
GFR, Estimated: >60 mL/min (ref >60)
Glucose, Bld: 85 mg/dL (ref 70–99)
Potassium: 3.5 mmol/L (ref 3.5–5.1)
Sodium: 137 mmol/L (ref 135–145)
Total Bilirubin: 0.9 mg/dL (ref 0.3–1.2)
Total Protein: 6.4 g/dL — ABNORMAL LOW (ref 6.5–8.1)

## 2020-10-09 LAB — CBC
HCT: 32.1 % — ABNORMAL LOW (ref 36.0–46.0)
Hemoglobin: 11.2 g/dL — ABNORMAL LOW (ref 12.0–15.0)
MCH: 35.6 pg — ABNORMAL HIGH (ref 26.0–34.0)
MCHC: 34.9 g/dL (ref 30.0–36.0)
MCV: 101.9 fL — ABNORMAL HIGH (ref 80.0–100.0)
Platelets: 130 10*3/uL — ABNORMAL LOW (ref 150–400)
RBC: 3.15 MIL/uL — ABNORMAL LOW (ref 3.87–5.11)
RDW: 12.2 % (ref 11.5–15.5)
WBC: 7.3 10*3/uL (ref 4.0–10.5)
nRBC: 0 % (ref 0.0–0.2)

## 2020-10-09 LAB — MAGNESIUM: Magnesium: 1.8 mg/dL (ref 1.7–2.4)

## 2020-10-09 LAB — PHOSPHORUS: Phosphorus: 1.9 mg/dL — ABNORMAL LOW (ref 2.5–4.6)

## 2020-10-09 MED ORDER — METOCLOPRAMIDE HCL 5 MG PO TABS: 5.0000 mg | ORAL_TABLET | Freq: Three times a day (TID) | ORAL | 0 refills | Status: DC

## 2020-10-09 NOTE — Plan of Care (Signed)
Nausea managed with prn and scheduled medications. Pt reports no episodes of vomiting this shift.   Problem: Nutrition: Goal: Adequate nutrition will be maintained Outcome: Progressing   Problem: Elimination: Goal: Will not experience complications related to bowel motility Outcome: Progressing Goal: Will not experience complications related to urinary retention Outcome: Progressing

## 2020-10-09 NOTE — Discharge Summary (Signed)
DISCHARGE SUMMARY  Jeanette Allen  MR#: [id number]  DOB:08/12/1955  Date of Admission: 10/06/2020 Date of Discharge: 10/09/2020  Attending Physician:Kacyn Souder Hennie Duos, MD  Patient's RCV:[id number], Jeanette Rowan, NP  Consults: Gen Surgery   Disposition: D/C home   Date of Positive COVID Test: 10/06/2020   Follow-up Appts:  Follow-up Information     Jeanette Berry, NP. Schedule an appointment as soon as possible for a visit in 1 week(s).   Specialty: Nurse Practitioner Contact information: [address] [place] [address] [phone]                 Discharge Diagnoses: Partial small bowel obstruction COVID-19 infection Acute kidney injury High-grade endometrial cancer Depression GERD  Initial presentation: 65yo with a history of endometrial cancer, hepatitis C, and colostomy who presented to the hospital with severe abdominal pain nausea and vomiting.  She was found to be COVID-positive at the time of her presentation.  In the ED a CT of the abdomen/pelvis revealed dilated loops of distal jejunum and proximal ileum with air-fluid levels c/w a pSBO.  Hospital Course:  Partial small bowel obstruction - nausea and vomiting General surgery evaluated - resolved with ileostomy output and improving f/u imaging 10/5 - repeat imaging 10/6 noted stability of bowel gas pattern -short trial of scheduled Reglan to continue after d/c w/ transition to oral route for 5 days more -continue as needed Zofran and backup Compazine   COVID-19 infection May be contributing to her nausea and vomiting -discontinued Remdesivir as nausea is a potential side effect and she otherwise was not likely benefiting from this medication - no severe sx at time of d/c    Acute kidney injury Baseline creatinine approximately 0.85 - creatinine has normalized at time of discharge   High-grade endometrial cancer Currently undergoing chemotherapy with carboplatin and paclitaxel -follow-up with  oncology clinic in outpatient setting   Depression Continue fluoxetine   GERD Continue home medical therapy    Allergies as of 10/09/2020   No Known Allergies      Medication List     TAKE these medications    acetaminophen 500 MG tablet Commonly known as: TYLENOL Take by mouth.   ergocalciferol 1.25 MG (50000 UT) capsule Commonly known as: VITAMIN D2 Take by mouth.   FLUoxetine 40 MG capsule Commonly known as: PROzac Take 1 capsule (40 mg total) by mouth daily. What changed: Another medication with the same name was removed. Continue taking this medication, and follow the directions you see here.   Lenvima (20 MG Daily Dose) 2 x 10 MG capsule Generic drug: lenvatinib 20 mg daily dose Take 2 capsules (20 mg total) by mouth daily.   metoCLOPramide 5 MG tablet Commonly known as: REGLAN Take 1 tablet (5 mg total) by mouth every 8 (eight) hours for 5 days.   omeprazole 40 MG capsule Commonly known as: PRILOSEC Take 1 capsule (40 mg total) by mouth 2 (two) times daily before a meal.   ondansetron 4 MG disintegrating tablet Commonly known as: ZOFRAN-ODT Take 4 mg by mouth every 8 (eight) hours as needed for nausea or vomiting.   ondansetron 8 MG tablet Commonly known as: ZOFRAN One pill every 8 hours as needed for nausea/vomitting.   prochlorperazine 10 MG tablet Commonly known as: COMPAZINE Take 1 tablet (10 mg total) by mouth every 6 (six) hours as needed for nausea or vomiting.        Day of Discharge BP (!) 120/58 (BP Location: Right Arm)  Pulse 96   Temp 98 F (36.7 C) (Oral)   Resp 18   Ht 5\' 4"  (1.626 m)   Wt 68 kg   SpO2 98%   BMI 25.75 kg/m   Physical Exam: General: No acute respiratory distress Lungs: Clear to auscultation bilaterally without wheezes or crackles Cardiovascular: Regular rate and rhythm without murmur gallop or rub normal S1 and S2 Abdomen: Nontender, nondistended, soft, bowel sounds positive, no rebound, no ascites, no  appreciable mass Extremities: No significant cyanosis, clubbing, or edema bilateral lower extremities  Basic Metabolic Panel: Recent Labs  Lab 10/06/20 0915 10/07/20 0656 10/08/20 0452 10/09/20 0458  NA 135 136 134* 137  K 5.2* 4.0 3.9 3.5  CL 102 104 104 108  CO2 23 22 23 23   GLUCOSE 132* 121* 120* 85  BUN 26* 20 22 16   CREATININE 1.38* 1.05* 0.97 0.81  CALCIUM 10.7* 10.1 8.8* 8.1*  MG 1.9 2.0  --  1.8  PHOS  --   --   --  1.9*    Liver Function Tests: Recent Labs  Lab 10/06/20 0915 10/07/20 0656 10/09/20 0458  AST 24 24 27   ALT 22 21 24   ALKPHOS 81 70 64  BILITOT 0.8 1.8* 0.9  PROT 9.2* 7.6 6.4*  ALBUMIN 4.6 3.9 3.2*    CBC: Recent Labs  Lab 10/06/20 0915 10/07/20 0656 10/09/20 0458  WBC 9.2 9.9 7.3  NEUTROABS 7.4  --   --   HGB 15.4* 13.3 11.2*  HCT 44.2 37.0 32.1*  MCV 101.1* 100.8* 101.9*  PLT 191 172 130*     Recent Results (from the past 240 hour(s))  Resp Panel by RT-PCR (Flu A&B, Covid) Nasopharyngeal Swab     Status: Abnormal   Collection Time: 10/06/20  9:40 PM   Specimen: Nasopharyngeal Swab; Nasopharyngeal(NP) swabs in vial transport medium  Result Value Ref Range Status   SARS Coronavirus 2 by RT PCR POSITIVE (A) NEGATIVE Final    Comment: RESULT CALLED TO, READ BACK BY AND VERIFIED WITH: Ammi Watts @2240  on 10/06/20 skl (NOTE) SARS-CoV-2 target nucleic acids are DETECTED.  The SARS-CoV-2 RNA is generally detectable in upper respiratory specimens during the acute phase of infection. Positive results are indicative of the presence of the identified virus, but do not rule out bacterial infection or co-infection with other pathogens not detected by the test. Clinical correlation with patient history and other diagnostic information is necessary to determine patient infection status. The expected result is Negative.  Fact Sheet for Patients: EntrepreneurPulse.com.au  Fact Sheet for Healthcare  Providers: IncredibleEmployment.be  This test is not yet approved or cleared by the [place] FDA and  has been authorized for detection and/or diagnosis of SARS-CoV-2 by FDA under an Emergency Use Authorization (EUA).  This EUA will remain in effect (meaning this test can be u sed) for the duration of  the COVID-19 declaration under Section 564(b)(1) of the Act, 21 U.S.C. section 360bbb-3(b)(1), unless the authorization is terminated or revoked sooner.     Influenza A by PCR NEGATIVE NEGATIVE Final   Influenza B by PCR NEGATIVE NEGATIVE Final    Comment: (NOTE) The Xpert Xpress SARS-CoV-2/FLU/RSV plus assay is intended as an aid in the diagnosis of influenza from Nasopharyngeal swab specimens and should not be used as a sole basis for treatment. Nasal washings and aspirates are unacceptable for Xpert Xpress SARS-CoV-2/FLU/RSV testing.  Fact Sheet for Patients: EntrepreneurPulse.com.au  Fact Sheet for Healthcare Providers: IncredibleEmployment.be  This test is not yet approved  or cleared by the [place] and has been authorized for detection and/or diagnosis of SARS-CoV-2 by FDA under an Emergency Use Authorization (EUA). This EUA will remain in effect (meaning this test can be used) for the duration of the COVID-19 declaration under Section 564(b)(1) of the Act, 21 U.S.C. section 360bbb-3(b)(1), unless the authorization is terminated or revoked.  Performed at [facility], [address] [place], [place] [address]       Time spent in discharge (includes decision making & examination of pt): 35 minutes  10/09/2020, 11:28 AM   Cherene Altes, MD [facility] Office  [phone]

## 2020-10-09 NOTE — Progress Notes (Signed)
Patient verbalized understanding of all discharge instructions.

## 2020-10-09 NOTE — Progress Notes (Signed)
Patient discharged to home in stable condition

## 2020-10-11 ENCOUNTER — Other Ambulatory Visit: Payer: Self-pay

## 2020-10-11 ENCOUNTER — Emergency Department: Payer: Medicare Other

## 2020-10-11 ENCOUNTER — Observation Stay: Payer: Medicare Other

## 2020-10-11 ENCOUNTER — Inpatient Hospital Stay
Admission: EM | Admit: 2020-10-11 | Discharge: 2020-10-14 | DRG: 388 | Disposition: A | Discharge status: Home / Self Care | Payer: Medicare Other | Attending: Internal Medicine | Admitting: Internal Medicine

## 2020-10-11 DIAGNOSIS — E878 Other disorders of electrolyte and fluid balance, not elsewhere classified: Secondary | ICD-10-CM | POA: Diagnosis present

## 2020-10-11 DIAGNOSIS — F32A Depression, unspecified: Secondary | ICD-10-CM | POA: Diagnosis present

## 2020-10-11 DIAGNOSIS — N170 Acute kidney failure with tubular necrosis: Secondary | ICD-10-CM | POA: Diagnosis present

## 2020-10-11 DIAGNOSIS — K56609 Unspecified intestinal obstruction, unspecified as to partial versus complete obstruction: Secondary | ICD-10-CM | POA: Diagnosis not present

## 2020-10-11 DIAGNOSIS — K5651 Intestinal adhesions [bands], with partial obstruction: Principal | ICD-10-CM | POA: Diagnosis present

## 2020-10-11 DIAGNOSIS — Z833 Family history of diabetes mellitus: Secondary | ICD-10-CM

## 2020-10-11 DIAGNOSIS — F419 Anxiety disorder, unspecified: Secondary | ICD-10-CM | POA: Diagnosis present

## 2020-10-11 DIAGNOSIS — C763 Malignant neoplasm of pelvis: Secondary | ICD-10-CM | POA: Diagnosis present

## 2020-10-11 DIAGNOSIS — Z79899 Other long term (current) drug therapy: Secondary | ICD-10-CM

## 2020-10-11 DIAGNOSIS — M5136 Other intervertebral disc degeneration, lumbar region: Secondary | ICD-10-CM | POA: Diagnosis present

## 2020-10-11 DIAGNOSIS — R8281 Pyuria: Secondary | ICD-10-CM | POA: Diagnosis present

## 2020-10-11 DIAGNOSIS — Z8542 Personal history of malignant neoplasm of other parts of uterus: Secondary | ICD-10-CM

## 2020-10-11 DIAGNOSIS — K219 Gastro-esophageal reflux disease without esophagitis: Secondary | ICD-10-CM | POA: Diagnosis present

## 2020-10-11 DIAGNOSIS — R112 Nausea with vomiting, unspecified: Secondary | ICD-10-CM | POA: Diagnosis present

## 2020-10-11 DIAGNOSIS — C799 Secondary malignant neoplasm of unspecified site: Secondary | ICD-10-CM | POA: Diagnosis present

## 2020-10-11 DIAGNOSIS — Z720 Tobacco use: Secondary | ICD-10-CM | POA: Diagnosis present

## 2020-10-11 DIAGNOSIS — E876 Hypokalemia: Secondary | ICD-10-CM | POA: Diagnosis present

## 2020-10-11 DIAGNOSIS — Z9049 Acquired absence of other specified parts of digestive tract: Secondary | ICD-10-CM

## 2020-10-11 DIAGNOSIS — E871 Hypo-osmolality and hyponatremia: Secondary | ICD-10-CM | POA: Diagnosis not present

## 2020-10-11 DIAGNOSIS — I2510 Atherosclerotic heart disease of native coronary artery without angina pectoris: Secondary | ICD-10-CM | POA: Diagnosis present

## 2020-10-11 DIAGNOSIS — E785 Hyperlipidemia, unspecified: Secondary | ICD-10-CM | POA: Diagnosis present

## 2020-10-11 DIAGNOSIS — E860 Dehydration: Secondary | ICD-10-CM | POA: Diagnosis present

## 2020-10-11 DIAGNOSIS — U071 COVID-19: Secondary | ICD-10-CM | POA: Diagnosis present

## 2020-10-11 DIAGNOSIS — R8290 Unspecified abnormal findings in urine: Secondary | ICD-10-CM | POA: Diagnosis present

## 2020-10-11 DIAGNOSIS — N179 Acute kidney failure, unspecified: Secondary | ICD-10-CM | POA: Diagnosis present

## 2020-10-11 DIAGNOSIS — Z933 Colostomy status: Secondary | ICD-10-CM

## 2020-10-11 DIAGNOSIS — C541 Malignant neoplasm of endometrium: Secondary | ICD-10-CM | POA: Diagnosis present

## 2020-10-11 DIAGNOSIS — E861 Hypovolemia: Secondary | ICD-10-CM | POA: Diagnosis present

## 2020-10-11 DIAGNOSIS — Z8249 Family history of ischemic heart disease and other diseases of the circulatory system: Secondary | ICD-10-CM

## 2020-10-11 DIAGNOSIS — Z0189 Encounter for other specified special examinations: Secondary | ICD-10-CM

## 2020-10-11 DIAGNOSIS — Z87891 Personal history of nicotine dependence: Secondary | ICD-10-CM

## 2020-10-11 DIAGNOSIS — I10 Essential (primary) hypertension: Secondary | ICD-10-CM | POA: Diagnosis present

## 2020-10-11 DIAGNOSIS — Z823 Family history of stroke: Secondary | ICD-10-CM

## 2020-10-11 DIAGNOSIS — D539 Nutritional anemia, unspecified: Secondary | ICD-10-CM | POA: Diagnosis present

## 2020-10-11 LAB — URINALYSIS, COMPLETE (UACMP) WITH MICROSCOPIC
Bacteria, UA: NONE SEEN
Bilirubin Urine: NEGATIVE
Glucose, UA: NEGATIVE mg/dL
Hgb urine dipstick: NEGATIVE
Ketones, ur: 20 mg/dL — AB
Nitrite: NEGATIVE
Protein, ur: 30 mg/dL — AB
Specific Gravity, Urine: >1.046 — ABNORMAL HIGH (ref 1.005–1.030)
pH: 5 (ref 5.0–8.0)

## 2020-10-11 LAB — HEPATIC FUNCTION PANEL
ALT: 21 U/L (ref 0–44)
AST: 21 U/L (ref 15–41)
Albumin: 4.1 g/dL (ref 3.5–5.0)
Alkaline Phosphatase: 67 U/L (ref 38–126)
Bilirubin, Direct: 0.2 mg/dL (ref 0.0–0.2)
Indirect Bilirubin: 0.9 mg/dL (ref 0.3–0.9)
Total Bilirubin: 1.1 mg/dL (ref 0.3–1.2)
Total Protein: 7.6 g/dL (ref 6.5–8.1)

## 2020-10-11 LAB — CBC WITH DIFFERENTIAL/PLATELET
Abs Immature Granulocytes: 0.04 10*3/uL (ref 0.00–0.07)
Basophils Absolute: 0 10*3/uL (ref 0.0–0.1)
Basophils Relative: 0 %
Eosinophils Absolute: 0 10*3/uL (ref 0.0–0.5)
Eosinophils Relative: 0 %
HCT: 40.1 % (ref 36.0–46.0)
Hemoglobin: 14.3 g/dL (ref 12.0–15.0)
Immature Granulocytes: 0 %
Lymphocytes Relative: 10 %
Lymphs Abs: 0.9 10*3/uL (ref 0.7–4.0)
MCH: 35.4 pg — ABNORMAL HIGH (ref 26.0–34.0)
MCHC: 35.7 g/dL (ref 30.0–36.0)
MCV: 99.3 fL (ref 80.0–100.0)
Monocytes Absolute: 0.8 10*3/uL (ref 0.1–1.0)
Monocytes Relative: 9 %
Neutro Abs: 7.4 10*3/uL (ref 1.7–7.7)
Neutrophils Relative %: 81 %
Platelets: 189 10*3/uL (ref 150–400)
RBC: 4.04 MIL/uL (ref 3.87–5.11)
RDW: 12.2 % (ref 11.5–15.5)
WBC: 9.2 10*3/uL (ref 4.0–10.5)
nRBC: 0 % (ref 0.0–0.2)

## 2020-10-11 LAB — LACTIC ACID, PLASMA: Lactic Acid, Venous: 1.8 mmol/L (ref 0.5–1.9)

## 2020-10-11 LAB — RESP PANEL BY RT-PCR (FLU A&B, COVID) ARPGX2
Influenza A by PCR: NEGATIVE
Influenza B by PCR: NEGATIVE
SARS Coronavirus 2 by RT PCR: POSITIVE — AB

## 2020-10-11 LAB — BASIC METABOLIC PANEL
Anion gap: 13 (ref 5–15)
BUN: 22 mg/dL (ref 8–23)
CO2: 20 mmol/L — ABNORMAL LOW (ref 22–32)
Calcium: 8.3 mg/dL — ABNORMAL LOW (ref 8.9–10.3)
Chloride: 96 mmol/L — ABNORMAL LOW (ref 98–111)
Creatinine, Ser: 1.29 mg/dL — ABNORMAL HIGH (ref 0.44–1.00)
GFR, Estimated: 46 mL/min — ABNORMAL LOW (ref >60)
Glucose, Bld: 127 mg/dL — ABNORMAL HIGH (ref 70–99)
Potassium: 3.8 mmol/L (ref 3.5–5.1)
Sodium: 129 mmol/L — ABNORMAL LOW (ref 135–145)

## 2020-10-11 LAB — PHOSPHORUS: Phosphorus: 1.8 mg/dL — ABNORMAL LOW (ref 2.5–4.6)

## 2020-10-11 LAB — MAGNESIUM: Magnesium: 1.6 mg/dL — ABNORMAL LOW (ref 1.7–2.4)

## 2020-10-11 MED ORDER — IOHEXOL 350 MG/ML SOLN
100.0000 mL | Freq: Once | INTRAVENOUS | Status: AC | PRN
  Administered 2020-10-11: 100 mL via INTRAVENOUS

## 2020-10-11 MED ORDER — ACETAMINOPHEN 325 MG PO TABS: 650.0000 mg | ORAL_TABLET | Freq: Four times a day (QID) | ORAL | Status: DC | PRN

## 2020-10-11 MED ORDER — SODIUM CHLORIDE 0.9 % IV SOLN
1.0000 g | INTRAVENOUS | Status: AC
  Administered 2020-10-12 – 2020-10-13 (×2): 1 g via INTRAVENOUS
  Filled 2020-10-11 (×2): qty 1

## 2020-10-11 MED ORDER — PHENOL 1.4 % MT LIQD
1.0000 | OROMUCOSAL | Status: DC | PRN
  Filled 2020-10-11: qty 177

## 2020-10-11 MED ORDER — SODIUM CHLORIDE 0.9 % IV BOLUS
1000.0000 mL | Freq: Once | INTRAVENOUS | Status: AC
  Administered 2020-10-11: 1000 mL via INTRAVENOUS

## 2020-10-11 MED ORDER — ACETAMINOPHEN 650 MG RE SUPP: 650.0000 mg | Freq: Four times a day (QID) | RECTAL | Status: DC | PRN

## 2020-10-11 MED ORDER — SODIUM CHLORIDE 0.9 % IV SOLN
12.5000 mg | Freq: Three times a day (TID) | INTRAVENOUS | Status: DC | PRN
  Filled 2020-10-11: qty 0.5

## 2020-10-11 MED ORDER — METOCLOPRAMIDE HCL 5 MG/ML IJ SOLN
10.0000 mg | Freq: Four times a day (QID) | INTRAMUSCULAR | Status: DC | PRN
  Administered 2020-10-11: 22:00:00 10 mg via INTRAVENOUS
  Filled 2020-10-11: qty 2

## 2020-10-11 MED ORDER — ONDANSETRON HCL 4 MG/2ML IJ SOLN
4.0000 mg | Freq: Once | INTRAMUSCULAR | Status: AC
  Administered 2020-10-11: 4 mg via INTRAVENOUS
  Filled 2020-10-11: qty 2

## 2020-10-11 MED ORDER — FENTANYL CITRATE PF 50 MCG/ML IJ SOSY: 25.0000 ug | PREFILLED_SYRINGE | INTRAMUSCULAR | Status: DC | PRN

## 2020-10-11 MED ORDER — HEPARIN SODIUM (PORCINE) 5000 UNIT/ML IJ SOLN
5000.0000 [IU] | Freq: Three times a day (TID) | INTRAMUSCULAR | Status: DC
  Administered 2020-10-11 (×2): 5000 [IU] via SUBCUTANEOUS
  Filled 2020-10-11 (×2): qty 1

## 2020-10-11 MED ORDER — SODIUM CHLORIDE 0.9 % IV SOLN
INTRAVENOUS | Status: DC
Start: 2020-10-11 — End: 2020-10-12

## 2020-10-11 MED ORDER — MORPHINE SULFATE (PF) 2 MG/ML IV SOLN
2.0000 mg | INTRAVENOUS | Status: DC | PRN
  Administered 2020-10-11 – 2020-10-12 (×3): 2 mg via INTRAVENOUS
  Filled 2020-10-11 (×3): qty 1

## 2020-10-11 MED ORDER — ONDANSETRON HCL 4 MG PO TABS: 4.0000 mg | ORAL_TABLET | Freq: Four times a day (QID) | ORAL | Status: DC | PRN

## 2020-10-11 MED ORDER — MORPHINE SULFATE (PF) 4 MG/ML IV SOLN
4.0000 mg | Freq: Once | INTRAVENOUS | Status: AC
  Administered 2020-10-11: 4 mg via INTRAVENOUS
  Filled 2020-10-11: qty 1

## 2020-10-11 MED ORDER — ONDANSETRON HCL 4 MG/2ML IJ SOLN
4.0000 mg | Freq: Four times a day (QID) | INTRAMUSCULAR | Status: DC | PRN
Start: 2020-10-11 — End: 2020-10-11

## 2020-10-11 MED ORDER — PANTOPRAZOLE SODIUM 40 MG IV SOLR
40.0000 mg | Freq: Every day | INTRAVENOUS | Status: DC
  Administered 2020-10-12 – 2020-10-13 (×2): 40 mg via INTRAVENOUS
  Filled 2020-10-11 (×3): qty 40

## 2020-10-11 MED ORDER — SODIUM CHLORIDE 0.9 % IV SOLN
1.0000 g | Freq: Once | INTRAVENOUS | Status: AC
  Administered 2020-10-11: 1 g via INTRAVENOUS
  Filled 2020-10-11: qty 10

## 2020-10-11 NOTE — Progress Notes (Signed)
Patient arrived to unit in stable condition. 

## 2020-10-11 NOTE — ED Notes (Signed)
Return from CT

## 2020-10-11 NOTE — Consult Note (Signed)
Date of Consultation:  10/11/2020  Requesting Physician:  Arta Silence, MD  Reason for Consultation:  Small bowel obstruction  History of Present Illness: Jeanette Allen is a 65 y.o. female with recent admission on 10/07/20 for SBO who was discharged on 10/09/20.  She only required conservative measures and did not get NG tube placed.  She has a history of stage IV endometrial cancer and she's s/p debulking surgery at [facility] in March 2022 which also required sigmoid resection as well as diverting loop ileostomy and mucous fistula creation due to masses that were causing obstruction of the TI and cecum as well as splenic flexure.  She's been undergoing chemotherapy and is followed by Dr. Rogue Bussing.  She reports that she only lasted a day at home on the day of discharge feeling well and then started having nausea and emesis again yesterday, associated with abdominal pain in the mid abdomen.  She presented again to ED.  Labs showed dehydration with elevated Cr, hyponatremia.  CT scan showed diffuse dilation of small bowel loops, with transition point towards the low abdomen/pelvis.  I have personally viewed the images and agree with findings.  She says there's still some gas and liquid stool coming out of the loop ileostomy and it was just emptied prior to me seeing her in her room today.  Of note, she tested positive for COVID-19 on her last admission on 10/3 and is currently on airborne/contact precautions.  Past Medical History: Past Medical History:  Diagnosis Date   Arthritis    Cancer ([place])    Hypertension      Past Surgical History: Past Surgical History:  Procedure Laterality Date   APPENDECTOMY     COLONOSCOPY WITH PROPOFOL N/A 10/01/2019   Procedure: COLONOSCOPY WITH PROPOFOL;  Surgeon: Lin Landsman, MD;  Location: [facility] ENDOSCOPY;  Service: Gastroenterology;  Laterality: N/A;   ESOPHAGOGASTRODUODENOSCOPY (EGD) WITH PROPOFOL N/A 09/12/2019   Procedure:  ESOPHAGOGASTRODUODENOSCOPY (EGD) WITH PROPOFOL;  Surgeon: Lin Landsman, MD;  Location: [facility] ENDOSCOPY;  Service: Gastroenterology;  Laterality: N/A;    Home Medications: Prior to Admission medications   Medication Sig Start Date End Date Taking? Authorizing Provider  acetaminophen (TYLENOL) 500 MG tablet Take 500-1,000 mg by mouth every 6 (six) hours as needed for mild pain or moderate pain.   Yes [provider]  ergocalciferol (VITAMIN D2) 1.25 MG (50000 UT) capsule Take 50,000 Units by mouth every Monday.   Yes [provider]  FLUoxetine (PROZAC) 40 MG capsule Take 1 capsule (40 mg total) by mouth daily. 04/16/20  Yes Cammie Sickle, MD  metoCLOPramide (REGLAN) 5 MG tablet Take 1 tablet (5 mg total) by mouth every 8 (eight) hours for 5 days. 10/09/20 10/14/20 Yes Cherene Altes, MD  omeprazole (PRILOSEC) 40 MG capsule Take 1 capsule (40 mg total) by mouth 2 (two) times daily before a meal. Patient taking differently: Take 40 mg by mouth daily. 09/17/20  Yes Cammie Sickle, MD  ondansetron (ZOFRAN) 8 MG tablet One pill every 8 hours as needed for nausea/vomitting. 10/05/19  Yes Cammie Sickle, MD  ondansetron (ZOFRAN-ODT) 4 MG disintegrating tablet Take 4 mg by mouth every 8 (eight) hours as needed for nausea or vomiting.   Yes [provider]  prochlorperazine (COMPAZINE) 10 MG tablet Take 1 tablet (10 mg total) by mouth every 6 (six) hours as needed for nausea or vomiting. 05/07/20  Yes Cammie Sickle, MD  lenvatinib 20 mg daily dose (LENVIMA, 20 MG DAILY  DOSE,) 2 x 10 MG capsule Take 2 capsules (20 mg total) by mouth daily. Patient not taking: No sig reported 10/03/20   Cammie Sickle, MD    Allergies: No Known Allergies  Social History:  reports that she has quit smoking. Her smoking use included cigarettes. She has never used smokeless tobacco. She reports that she does not currently use alcohol. She reports that she  does not use drugs.   Family History: Family History  Problem Relation Age of Onset   Diabetes Maternal Grandmother    Hypertension Maternal Grandmother    Stroke Maternal Grandmother     Review of Systems: Review of Systems  Constitutional:  Negative for chills and fever.  HENT:  Negative for hearing loss.   Respiratory:  Negative for shortness of breath.   Cardiovascular:  Negative for chest pain.  Gastrointestinal:  Positive for abdominal pain, nausea and vomiting. Negative for constipation and diarrhea.  Genitourinary:  Negative for dysuria.  Musculoskeletal:  Negative for myalgias.  Skin:  Negative for rash.  Neurological:  Negative for dizziness.  Psychiatric/Behavioral:  Negative for depression.    Physical Exam BP (!) 150/73 (BP Location: Left Arm)   Pulse 96   Temp 98.4 F (36.9 C) (Oral)   Resp 18   Ht 5\' 4"  (1.626 m)   Wt 68 kg   SpO2 99%   BMI 25.75 kg/m  CONSTITUTIONAL: No acute distress HEENT:  Normocephalic, atraumatic, extraocular motion intact. NECK: Trachea is midline, and there is no jugular venous distension. RESPIRATORY:  Normal respiratory effort without pathologic use of accessory muscles. CARDIOVASCULAR: Regular rhythm and rate. GI: The abdomen is soft, distended, currently non-tender to palpation.  RLQ loop ileostomy with healthy mucosa with currently some liquid stool in the bag.  LUQ mucous fistula healthy, with no output.  Midline incision is well healed. MUSCULOSKELETAL:  Normal muscle strength and tone in all four extremities.  No peripheral edema or cyanosis. SKIN: Skin turgor is normal. There are no pathologic skin lesions.  NEUROLOGIC:  Motor and sensation is grossly normal.  Cranial nerves are grossly intact. PSYCH:  Alert and oriented to person, place and time. Affect is normal.  Laboratory Analysis: Results for orders placed or performed during the hospital encounter of 10/11/20 (from the past 24 hour(s))  Basic metabolic panel      Status: Abnormal   Collection Time: 10/11/20 11:42 AM  Result Value Ref Range   Sodium 129 (L) 135 - 145 mmol/L   Potassium 3.8 3.5 - 5.1 mmol/L   Chloride 96 (L) 98 - 111 mmol/L   CO2 20 (L) 22 - 32 mmol/L   Glucose, Bld 127 (H) 70 - 99 mg/dL   BUN 22 8 - 23 mg/dL   Creatinine, Ser 1.29 (H) 0.44 - 1.00 mg/dL   Calcium 8.3 (L) 8.9 - 10.3 mg/dL   GFR, Estimated 46 (L) >60 mL/min   Anion gap 13 5 - 15  Hepatic function panel     Status: None   Collection Time: 10/11/20 11:42 AM  Result Value Ref Range   Total Protein 7.6 6.5 - 8.1 g/dL   Albumin 4.1 3.5 - 5.0 g/dL   AST 21 15 - 41 U/L   ALT 21 0 - 44 U/L   Alkaline Phosphatase 67 38 - 126 U/L   Total Bilirubin 1.1 0.3 - 1.2 mg/dL   Bilirubin, Direct 0.2 0.0 - 0.2 mg/dL   Indirect Bilirubin 0.9 0.3 - 0.9 mg/dL  CBC with Differential  Status: Abnormal   Collection Time: 10/11/20 11:42 AM  Result Value Ref Range   WBC 9.2 4.0 - 10.5 K/uL   RBC 4.04 3.87 - 5.11 MIL/uL   Hemoglobin 14.3 12.0 - 15.0 g/dL   HCT 40.1 36.0 - 46.0 %   MCV 99.3 80.0 - 100.0 fL   MCH 35.4 (H) 26.0 - 34.0 pg   MCHC 35.7 30.0 - 36.0 g/dL   RDW 12.2 11.5 - 15.5 %   Platelets 189 150 - 400 K/uL   nRBC 0.0 0.0 - 0.2 %   Neutrophils Relative % 81 %   Neutro Abs 7.4 1.7 - 7.7 K/uL   Lymphocytes Relative 10 %   Lymphs Abs 0.9 0.7 - 4.0 K/uL   Monocytes Relative 9 %   Monocytes Absolute 0.8 0.1 - 1.0 K/uL   Eosinophils Relative 0 %   Eosinophils Absolute 0.0 0.0 - 0.5 K/uL   Basophils Relative 0 %   Basophils Absolute 0.0 0.0 - 0.1 K/uL   Immature Granulocytes 0 %   Abs Immature Granulocytes 0.04 0.00 - 0.07 K/uL  Lactic acid, plasma     Status: None   Collection Time: 10/11/20 12:00 PM  Result Value Ref Range   Lactic Acid, Venous 1.8 0.5 - 1.9 mmol/L  Urinalysis, Complete w Microscopic     Status: Abnormal   Collection Time: 10/11/20  1:02 PM  Result Value Ref Range   Color, Urine YELLOW (A) YELLOW   APPearance HAZY (A) CLEAR   Specific  Gravity, Urine >1.046 (H) 1.005 - 1.030   pH 5.0 5.0 - 8.0   Glucose, UA NEGATIVE NEGATIVE mg/dL   Hgb urine dipstick NEGATIVE NEGATIVE   Bilirubin Urine NEGATIVE NEGATIVE   Ketones, ur 20 (A) NEGATIVE mg/dL   Protein, ur 30 (A) NEGATIVE mg/dL   Nitrite NEGATIVE NEGATIVE   Leukocytes,Ua LARGE (A) NEGATIVE   RBC / HPF 6-10 0 - 5 RBC/hpf   WBC, UA 21-50 0 - 5 WBC/hpf   Bacteria, UA NONE SEEN NONE SEEN   Squamous Epithelial / LPF 6-10 0 - 5   Mucus PRESENT   Phosphorus     Status: Abnormal   Collection Time: 10/11/20  2:13 PM  Result Value Ref Range   Phosphorus 1.8 (L) 2.5 - 4.6 mg/dL  Magnesium     Status: Abnormal   Collection Time: 10/11/20  2:13 PM  Result Value Ref Range   Magnesium 1.6 (L) 1.7 - 2.4 mg/dL    Imaging: DG Chest 2 View  Result Date: 10/11/2020 CLINICAL DATA:  Shortness of breath. EXAM: CHEST - 2 VIEW COMPARISON:  None. FINDINGS: The heart size and mediastinal contours are within normal limits. Both lungs are clear. The visualized skeletal structures are unremarkable. IMPRESSION: No active cardiopulmonary disease. Electronically Signed   By: Abelardo Diesel M.D.   On: 10/11/2020 12:58   CT ABDOMEN PELVIS W CONTRAST  Result Date: 10/11/2020 CLINICAL DATA:  Abdominal pain. EXAM: CT ABDOMEN AND PELVIS WITH CONTRAST TECHNIQUE: Multidetector CT imaging of the abdomen and pelvis was performed using the standard protocol following bolus administration of intravenous contrast. CONTRAST:  153mL OMNIPAQUE IOHEXOL 350 MG/ML SOLN COMPARISON:  October 06, 2020 FINDINGS: Lower chest: Scattered areas of ground-glass consolidation in the right lung base. Hepatobiliary: Normal attenuation of the liver. Multiple too small to be actually characterize by CT hypoattenuated circumscribed nodules throughout the liver. Gallbladder is grossly normal. Pancreas: Unremarkable. No pancreatic ductal dilatation or surrounding inflammatory changes. Spleen: Normal in size without focal  abnormality.  Adrenals/Urinary Tract: Adrenal glands are unremarkable. Kidneys are normal, without renal calculi, focal lesion, or hydronephrosis. Bladder is unremarkable. Stomach/Bowel: Normal appearance of the stomach. Diffuse dilation of small bowel loops measuring up to 4.1 cm. Gradual transition to normal caliber and collapsed state of the distal small bowel. Loop ileostomy is unchanged and is not the source of obstruction. The distal ileal loops downstream to the loop ileostomy are collapsed. Prior left-sided colonic resection with Hartmann's pouch in the left mid upper abdomen. Decompressed residual colon. Vascular/Lymphatic: Aortic atherosclerosis. No enlarged abdominal or pelvic lymph nodes. Reproductive: Status post hysterectomy. No adnexal masses. Other: Increased free fluid in the abdomen and pelvis. Musculoskeletal: Lumbosacral spine spondylosis. IMPRESSION: 1. Postsurgical changes of left-sided colostomy and right-sided loop ileostomy. Diffuse dilation of small bowel loops measuring up to 4.1 cm with gradual transition to normal caliber and collapsed state of the distal small bowel. The distal ileal loops downstream to the loop ileostomy are collapsed. Findings are consistent with small-bowel obstruction, likely due to adhesions. 2. Increased free fluid in the abdomen and pelvis. 3. Multiple too small to be actually characterize by CT hypoattenuated circumscribed nodules throughout the liver. 4. Scattered areas of ground-glass consolidation in the right lung base, which may represent infectious or inflammatory changes. Aortic Atherosclerosis (ICD10-I70.0). Electronically Signed   By: Fidela Salisbury M.D.   On: 10/11/2020 12:36   DG Abd Portable 1V  Result Date: 10/11/2020 CLINICAL DATA:  Enteric catheter placement, ileus, vomiting EXAM: PORTABLE ABDOMEN - 1 VIEW COMPARISON:  10/09/2020 FINDINGS: Supine frontal view of the abdomen and pelvis demonstrates enteric catheter tip passing below diaphragm overlying  the gastric fundus. Side port projects just above the gastroesophageal junction, recommend advancing at least 3-4 cm to ensure placement within the gastric lumen. Continued gaseous distention of the small bowel measuring up to 4 cm consistent with small-bowel obstruction. Ostomy right lower quadrant. Excreted contrast identified within the kidneys, ureters, and bladder from preceding CT. IMPRESSION: 1. Enteric catheter tip projecting over the gastric fundus. Recommend advancing catheter 3-4 cm to ensure side port placement within the gastric lumen. 2. Continued small bowel obstruction. Electronically Signed   By: Randa Ngo M.D.   On: 10/11/2020 16:24    Assessment and Plan: This is a 65 y.o. female with recurrent small bowel obstruction.  --Patient has history of metastatic endometrial cancer, s/p debulking surgery with loop ileostomy and mucous fistula.  She had recent admission for SBO and was treated conservatively. She presents again with the same.  NG tube is now placed and she feels better.  Discussed with her that this time around we would do still conservative measures but with NG tube to suction while awaiting return of bowel function.  Recommend keeping her NPO, with IV fluid hydration, and appropriate pain/nausea control.  Discussed with her that the majority of episodes resolve with conservative measures, but a minority of patients need surgery.  Given her complex case, if she were to need surgery, would be beneficial to consider transferring to [facility], as she may have further peritoneal disease that could make this more complicated.  For now will start with conservative measures and reassess.  Face-to-face time spent with the patient and care providers was 80 minutes, with more than 50% of the time spent counseling, educating, and coordinating care of the patient.     Melvyn Neth, MD [facility] Pg:  [phone]

## 2020-10-11 NOTE — ED Notes (Signed)
Patient transported to CT 

## 2020-10-11 NOTE — ED Notes (Signed)
MD at bedside. 

## 2020-10-11 NOTE — ED Notes (Signed)
Patient returns from CT

## 2020-10-11 NOTE — Progress Notes (Signed)
Patient's NG hooked up to low intermittent sx by general surgeon at bedside.

## 2020-10-11 NOTE — ED Notes (Signed)
Informed RN bed assigned 

## 2020-10-11 NOTE — H&P (Addendum)
History and Physical   Jeanette Allen:[id number] DOB: 1955/12/24 DOA: 10/11/2020  PCP: Danelle Berry, NP  Outpatient Specialists: Dr. Rogue Bussing Patient coming from: Home  I have personally briefly reviewed patient's old medical records in [place].  Chief Concern: Vomiting and weakness  HPI: Jeanette Allen is a 65 y.o. 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  On day of ED presentation, she vomitted 5 times, yellow green vomitus prior to presentation to the ED. She vomited all day on 10/10/20.   She denies fever. She denies chest pain. She endorses shortness of breath during and after vomiting. She denies lost of consciousness or syncope. The ostomy bag is producing stool, watery, dark green and brown.   She reports nothing helped her nausea and vomiting at home. In the ED, she states the morphine and zofran helped improve the symptoms.  At bedside patient is able to tell me her name, age, current location of hospital.  She is awake and alert and oriented.  She has a flat affect.  NG tube is present in the right nares. Colostomy bag was in place with watery yellow stool.  Paratomal hernia is present.  Social history: She lives at home with her husband. She is a former tobacco user, at her peak smoking 0.5 ppd. She quit smoking in March 2022. She infrequently drinks etoh and does not remember the last time she drank alcohol. She formerly worked as a Research scientist (physical sciences).   Vaccination history: She is vaccinated for covid 19, two doses of Moderna. She received one booster from  [place]: Constitutional: no weight change, no fever ENT/Mouth: no sore throat, no rhinorrhea Eyes: no eye pain, no vision changes Cardiovascular: no chest pain, no dyspnea,  no edema, no palpitations Respiratory: no cough, no sputum, no wheezing Gastrointestinal: + nausea, + vomiting, + diarrhea, no constipation Genitourinary: no urinary incontinence, no dysuria, no hematuria Musculoskeletal: no arthralgias, no myalgias Skin: no skin lesions, no pruritus, Neuro: + weakness, no loss of consciousness, no syncope Psych: no anxiety, no depression, + decrease appetite Heme/Lymph: no bruising, no bleeding  ED Course: Discussed with emergency medicine provider, patient requiring hospitalization for chief concerns of small bowel obstruction.  Vitals in the emergency department was remarkable for temperature of 97.8, respiration rate of 16, heart rate of 110, initial blood pressure 143/91, and increased to 155/80, SPO2 100% on room air.  Labs in the emergency department showed sodium 129, potassium 3.8, chloride 96, bicarb 20, BUN of 22, serum creatinine of 1.29, nonfasting blood glucose 127, EGFR 46.  WBC was 9.2, hemoglobin 14.3, platelets 189.  Lactic acid 1.8.  UA in the emergency department was remarkable for large leukocytes.  ED provider consulted general surgery, Dr. Hampton Abbot who recommended NG tube at this time.  In the emergency department patient was given morphine 4 mg IV once, Zofran 4 mg IV, ceftriaxone 1 g IV, sodium chloride 1 L bolus.  NG tube placement was ordered by EDP.   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 Hampton Abbot has been consulted, we appreciate further recommendations - Continue n.p.o. - Status post sodium chloride 1 L bolus - Supplement with IV fluids, sodium chloride 125 mL/h - Supportive care: Metoclopramide 10 mg IV every 6 hours as needed for nausea and vomiting for prokinetic; Phenergan 12.5 mg IV every 8 hours for refractory nausea and vomiting, 2 doses ordered - Pain control: Morphine 2 mg IV every 3 hours as needed for moderate pain, 5 doses ordered; fentanyl 25 mcg IV every 3 hours as needed for severe pain, 3 doses ordered - Check magnesium and phosphorus - EKG ordered to assess for current QTC - Admit to MedSurg bed, observation, telemetry ordered for 24 hours  # Acute kidney injury-presumed prerenal secondary to poor p.o. intake in setting of weakness, abdominal pain, vomiting - Status post sodium chloride 1 L bolus per EDP - Continue with sodium chloride 125 mL/h, 1 day ordered  # Hyponatremia and hypochloremia-suspect secondary to the above, treat as above - BMP in the a.m.  # Pyuria on presentation-status post ceftriaxone 1 g IV, one-time dose per EDP - Continue ceftriaxone 1 g IV, 2 more doses starting on 10/12/2020  # Endometrial cancer with metastasis-outpatient follow-up with oncology - Patient is currently on carboplatin and paclitaxel - Patient has infusion planned for 10/15/2020  # Depression/anxiety-holding home fluoxetine 40 mg daily at this time - Patient has not taken a dose in 2 days due to emesis  # GERD-oral PPI has been held at this time; pantoprazole 40 mg IV initiated for 10/13/2018  # Patient tested positive for COVID-19 on 10/06/2020-continue  airborne and contact precautions  Continue flutter valve, incentive spirometry Progressive mobility as tolerated  Chart reviewed.   DVT prophylaxis: Heparin 5000 units every 8 hours subcutaneous Code Status: Full code Diet: N.p.o. Family Communication: no  Disposition Plan: Pending clinical course Consults called: General surgery Admission status: MedSurg, observation, telemetry  Past Medical History:  Diagnosis Date   Arthritis    Cancer ([place])    Hypertension    Past Surgical History:  Procedure Laterality Date   APPENDECTOMY     COLONOSCOPY WITH PROPOFOL N/A 10/01/2019   Procedure: COLONOSCOPY WITH PROPOFOL;  Surgeon: Lin Landsman, MD;  Location: [facility] ENDOSCOPY;  Service: Gastroenterology;  Laterality: N/A;   ESOPHAGOGASTRODUODENOSCOPY (EGD) WITH PROPOFOL N/A 09/12/2019   Procedure: ESOPHAGOGASTRODUODENOSCOPY (EGD) WITH PROPOFOL;  Surgeon: Lin Landsman, MD;  [facility] END[facility]COPY;  Service: Gastroenterology;  Laterality: N/A;   Social History:  reports that she has quit smoking. Her smoking use included cigarettes. She has never used smokeless tobacco. She reports that she does not currently use alcohol. She reports that she does not use drugs.  No Known Allergies Family History  Problem Relation Age of Onset   Diabetes Maternal Grandmother    Hypertension Maternal Grandmother    Stroke Maternal Grandmother    Family history: Family history reviewed and not pertinent  Prior to Admission medications   Medication Sig Start  Date End Date Taking? Authorizing Provider  acetaminophen (TYLENOL) 500 MG tablet Take 500-1,000 mg by mouth every 6 (six) hours as needed for mild pain or moderate pain.   Yes [provider]  ergocalciferol (VITAMIN D2) 1.25 MG (50000 UT) capsule Take 50,000 Units by mouth every Monday.   Yes [provider]  FLUoxetine (PROZAC) 40 MG capsule Take 1 capsule (40 mg total) by mouth daily. 04/16/20  Yes Cammie Sickle, MD  metoCLOPramide (REGLAN) 5 MG tablet Take 1 tablet (5 mg total) by mouth every 8 (eight) hours for 5 days. 10/09/20 10/14/20 Yes Cherene Altes, MD  omeprazole (PRILOSEC) 40 MG capsule Take 1 capsule (40 mg total) by mouth 2 (two) times daily before a meal. Patient taking differently: Take 40 mg by mouth daily. 09/17/20  Yes Cammie Sickle, MD  ondansetron (ZOFRAN) 8 MG tablet One pill every 8 hours as needed for nausea/vomitting. 10/05/19  Yes Cammie Sickle, MD  ondansetron (ZOFRAN-ODT) 4 MG disintegrating tablet Take 4 mg by mouth every 8 (eight) hours as needed for nausea or vomiting.   Yes [provider]  prochlorperazine (COMPAZINE) 10 MG tablet Take 1 tablet (10 mg total) by mouth every 6 (six) hours as needed for nausea or vomiting. 05/07/20  Yes Cammie Sickle, MD  lenvatinib 20 mg daily dose (LENVIMA, 20 MG DAILY DOSE,) 2 x 10 MG capsule Take 2 capsules (20 mg total) by mouth daily. Patient not taking: No sig reported 10/03/20   Cammie Sickle, MD   Physical Exam: Vitals:   10/11/20 1303 10/11/20 1415 10/11/20 1503 10/11/20 1512  BP: (!) 155/80 139/71 (!) 154/99 (!) 154/99  Pulse: (!) 101 93 (!) 124 (!) 105  Resp: '16 16 18 16  ' Temp:    98.4 F (36.9 C)  TempSrc:    Oral  SpO2: 100% 100% 100% 100%  Weight:      Height:       Constitutional: appears older than chronological age, frail, chronically ill, NAD, calm, comfortable Eyes: PERRL, lids and conjunctivae normal ENMT: Mucous membranes are moist. Posterior pharynx clear of any exudate or lesions. Age-appropriate dentition. Hearing appropriate.  NG tube in place at the right nare. Neck: normal, supple, no masses, no thyromegaly Respiratory: clear to auscultation bilaterally, no wheezing, no crackles. Normal respiratory effort. No accessory muscle use.  Cardiovascular: Regular rate and rhythm, no murmurs / rubs / gallops. No extremity edema. 2+ pedal pulses. No carotid bruits.  Abdomen:  + mild tenderness, no masses palpated, no hepatosplenomegaly. Bowel sounds positive.  Colostomy bag in place with yellow watery substance.  Parastomal hernia is present. Musculoskeletal: no clubbing / cyanosis. No joint deformity upper and lower extremities. Good ROM, no contractures, no atrophy. Normal muscle tone.  Skin: no rashes, lesions, ulcers. No induration Neurologic: Sensation intact. Strength 5/5 in all 4.  Psychiatric: Normal judgment and insight. Alert and oriented x 3.  Flat affect  EKG: ordered  Chest x-ray on Admission: I personally reviewed and I agree with radiologist reading as below.  DG Chest 2 View  Result Date: 10/11/2020 CLINICAL DATA:  Shortness of breath. EXAM: CHEST - 2 VIEW COMPARISON:  None. FINDINGS: The heart size and mediastinal contours are within normal limits. Both lungs are clear. The visualized skeletal structures are unremarkable. IMPRESSION: No active cardiopulmonary disease. Electronically Signed   By: Abelardo Diesel M.D.   On: 10/11/2020 12:58   CT ABDOMEN PELVIS W CONTRAST  Result Date: 10/11/2020 CLINICAL DATA:  Abdominal pain. EXAM: CT ABDOMEN AND PELVIS WITH CONTRAST TECHNIQUE: Multidetector CT imaging of the abdomen and pelvis was performed using the standard protocol following bolus administration of intravenous contrast. CONTRAST:  166m OMNIPAQUE IOHEXOL 350 MG/ML SOLN COMPARISON:  October 06, 2020 FINDINGS: Lower chest: Scattered areas of ground-glass consolidation in the right lung base. Hepatobiliary: Normal attenuation of the liver. Multiple too small to be actually characterize by CT hypoattenuated circumscribed nodules throughout the liver. Gallbladder is grossly normal. Pancreas: Unremarkable. No pancreatic ductal dilatation or surrounding inflammatory changes. Spleen: Normal in size without focal abnormality. Adrenals/Urinary Tract: Adrenal glands are unremarkable. Kidneys are normal, without renal calculi, focal lesion, or hydronephrosis. Bladder  is unremarkable. Stomach/Bowel: Normal appearance of the stomach. Diffuse dilation of small bowel loops measuring up to 4.1 cm. Gradual transition to normal caliber and collapsed state of the distal small bowel. Loop ileostomy is unchanged and is not the source of obstruction. The distal ileal loops downstream to the loop ileostomy are collapsed. Prior left-sided colonic resection with Hartmann's pouch in the left mid upper abdomen. Decompressed residual colon. Vascular/Lymphatic: Aortic atherosclerosis. No enlarged abdominal or pelvic lymph nodes. Reproductive: Status post hysterectomy. No adnexal masses. Other: Increased free fluid in the abdomen and pelvis. Musculoskeletal: Lumbosacral spine spondylosis. IMPRESSION: 1. Postsurgical changes of left-sided colostomy and right-sided loop ileostomy. Diffuse dilation of small bowel loops measuring up to 4.1 cm with gradual transition to normal caliber and collapsed state of the distal small bowel. The distal ileal loops downstream to the loop ileostomy are collapsed. Findings are consistent with small-bowel obstruction, likely due to adhesions. 2. Increased free fluid in the abdomen and pelvis. 3. Multiple too small to be actually characterize by CT hypoattenuated circumscribed nodules throughout the liver. 4. Scattered areas of ground-glass consolidation in the right lung base, which may represent infectious or inflammatory changes. Aortic Atherosclerosis (ICD10-I70.0). Electronically Signed   By: DFidela SalisburyM.D.   On: 10/11/2020 12:36    Labs on Admission: I have personally reviewed following labs  CBC: Recent Labs  Lab 10/06/20 0915 10/07/20 0656 10/09/20 0458 10/11/20 1142  WBC 9.2 9.9 7.3 9.2  NEUTROABS 7.4  --   --  7.4  HGB 15.4* 13.3 11.2* 14.3  HCT 44.2 37.0 32.1* 40.1  MCV 101.1* 100.8* 101.9* 99.3  PLT 191 172 130* 1854  Basic Metabolic Panel: Recent Labs  Lab 10/06/20 0915 10/07/20 0656 10/08/20 0452 10/09/20 0458  10/11/20 1142 10/11/20 1413  NA 135 136 134* 137 129*  --   K 5.2* 4.0 3.9 3.5 3.8  --   CL 102 104 104 108 96*  --   CO2 '23 22 23 23 ' 20*  --   GLUCOSE 132* 121* 120* 85 127*  --   BUN 26* '20 22 16 22  ' --   CREATININE 1.38* 1.05* 0.97 0.81 1.29*  --   CALCIUM 10.7* 10.1 8.8* 8.1* 8.3*  --   MG 1.9 2.0  --  1.8  --  1.6*  PHOS  --   --   --  1.9*  --  1.8*   GFR: Estimated Creatinine Clearance: 40.6 mL/min (A) (by C-G formula based on SCr of 1.29 mg/dL (H)).  Liver Function Tests: Recent Labs  Lab 10/06/20 0915 10/07/20 0656 10/09/20 0458 10/11/20 1142  AST '24 24 27 21  ' ALT '22 21 24 21  ' ALKPHOS 81 70 64 67  BILITOT 0.8 1.8* 0.9 1.1  PROT 9.2* 7.6 6.4* 7.6  ALBUMIN 4.6 3.9 3.2* 4.1  Urine analysis:    Component Value Date/Time   COLORURINE YELLOW (A) 10/11/2020 1302   APPEARANCEUR HAZY (A) 10/11/2020 1302   APPEARANCEUR Clear 09/25/2012 1546   LABSPEC >1.046 (H) 10/11/2020 1302   LABSPEC 1.008 09/25/2012 1546   PHURINE 5.0 10/11/2020 1302   GLUCOSEU NEGATIVE 10/11/2020 1302   GLUCOSEU Negative 09/25/2012 1546   HGBUR NEGATIVE 10/11/2020 1302   BILIRUBINUR NEGATIVE 10/11/2020 1302   BILIRUBINUR Negative 09/25/2012 1546   KETONESUR 20 (A) 10/11/2020 1302   PROTEINUR 30 (A) 10/11/2020 1302   NITRITE NEGATIVE 10/11/2020 1302   LEUKOCYTESUR LARGE (A) 10/11/2020 1302   LEUKOCYTESUR Negative 09/25/2012 1546   Dr. Tobie Poet [facility]  If 7PM-7AM, please contact overnight-coverage provider If 7AM-7PM, please contact day coverage provider [email or web address]  10/11/2020, 4:05 PM

## 2020-10-11 NOTE — ED Provider Notes (Signed)
[facility] Emergency Department Provider Note ____________________________________________   Event Date/Time   First MD Initiated Contact with Patient 10/11/20 1128     (approximate)  I have reviewed the triage vital signs and the nursing notes.   HISTORY  Chief Complaint Emesis    HPI Jeanette Allen is a 65 y.o. female with PMH as noted below including endometrial cancer currently undergoing chemotherapy who presents with nausea and vomiting over the last day, multiple episodes, nonbloody, associated with upper abdominal pain but no distention.  The patient states that she is having normal output from her colostomy.  She denies any fever or chills.  She does have some shortness of breath.  She was just admitted for an SBO which was treated nonsurgically but states that this does not feel the same.  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   Goals of care, counseling/discussion 09/04/2019   Hepatitis C infection 08/29/2019   CAD (coronary artery disease) 08/29/2019   Depression 08/29/2019   Esophageal reflux 08/29/2019   Hyperlipidemia 08/29/2019   Hypertension 08/29/2019   Nonrheumatic mitral valve disorder 08/29/2019   Vitamin B12 deficiency 08/29/2019   Vitamin D deficiency 08/29/2019   Tobacco abuse 08/29/2019   Acute hepatitis C 08/13/2019   DDD (degenerative disc disease), lumbar 08/02/2018   Elevation of level of transaminase and lactic acid dehydrogenase (LDH) 02/18/2017   Nausea & vomiting 06/27/2013    Past  Surgical History:  Procedure Laterality Date   APPENDECTOMY     COLONOSCOPY WITH PROPOFOL N/A 10/01/2019   Procedure: COLONOSCOPY WITH PROPOFOL;  Surgeon: Lin Landsman, MD;  Location: [facility] ENDOSCOPY;  Service: Gastroenterology;  Laterality: N/A;   ESOPHAGOGASTRODUODENOSCOPY (EGD) WITH PROPOFOL N/A 09/12/2019   Procedure: ESOPHAGOGASTRODUODENOSCOPY (EGD) WITH PROPOFOL;  Surgeon: Lin Landsman, MD;  Location: [facility] ENDOSCOPY;  Service: Gastroenterology;  Laterality: N/A;    Prior to Admission medications   Medication Sig Start Date End Date Taking? Authorizing Provider  acetaminophen (TYLENOL) 500 MG tablet Take 500-1,000 mg by mouth every 6 (six) hours as needed for mild pain or moderate pain.   Yes [provider]  ergocalciferol (VITAMIN D2) 1.25 MG (50000 UT) capsule Take 50,000 Units by mouth every Monday.   Yes [provider]  FLUoxetine (PROZAC) 40 MG capsule Take 1 capsule (40 mg total) by mouth daily. 04/16/20  Yes Cammie Sickle, MD  metoCLOPramide (REGLAN) 5 MG tablet Take 1 tablet (5 mg total) by mouth every 8 (eight) hours for 5 days. 10/09/20 10/14/20 Yes Cherene Altes, MD  omeprazole (PRILOSEC) 40 MG capsule Take 1 capsule (40 mg total) by mouth 2 (two) times daily before a meal. Patient taking differently: Take 40 mg by mouth daily. 09/17/20  Yes Cammie Sickle, MD  ondansetron (ZOFRAN) 8 MG tablet One pill every 8 hours as needed for nausea/vomitting. 10/05/19  Yes Cammie Sickle, MD  ondansetron (ZOFRAN-ODT) 4 MG disintegrating tablet Take 4 mg by mouth every 8 (eight) hours as needed for nausea or vomiting.  Yes [provider]  prochlorperazine (COMPAZINE) 10 MG tablet Take 1 tablet (10 mg total) by mouth every 6 (six) hours as needed for nausea or vomiting. 05/07/20  Yes Cammie Sickle, MD  lenvatinib 20 mg daily dose (LENVIMA, 20 MG DAILY DOSE,) 2 x 10 MG capsule Take 2 capsules (20 mg total) by mouth  daily. Patient not taking: No sig reported 10/03/20   Cammie Sickle, MD    Allergies Patient has no known allergies.  Family History  Problem Relation Age of Onset   Diabetes Maternal Grandmother    Hypertension Maternal Grandmother    Stroke Maternal Grandmother     Social History Social History   Tobacco Use   Smoking status: Former    Types: Cigarettes   Smokeless tobacco: Never  Vaping Use   Vaping Use: Never used  Substance Use Topics   Alcohol use: Not Currently   Drug use: Never    Review of Systems  Constitutional: No fever. Eyes: No redness. ENT: No sore throat. Cardiovascular: Denies chest pain. Respiratory: Positive for shortness of breath. Gastrointestinal: Positive for nausea and vomiting.  No diarrhea.  Genitourinary: Negative for dysuria.  Musculoskeletal: Negative for back pain. Skin: Negative for rash. Neurological: Negative for headache.   ____________________________________________   PHYSICAL EXAM:  VITAL SIGNS: ED Triage Vitals  Enc Vitals Group     BP 10/11/20 1112 127/70     Pulse Rate 10/11/20 1112 (!) 124     Resp 10/11/20 1112 18     Temp 10/11/20 1112 97.8 F (36.6 C)     Temp Source 10/11/20 1112 Oral     SpO2 10/11/20 1112 98 %     Weight 10/11/20 1113 150 lb (68 kg)     Height 10/11/20 1113 5\' 4"  (1.626 m)     Head Circumference --      Peak Flow --      Pain Score 10/11/20 1112 10     Pain Loc --      Pain Edu? --      Excl. in [place]? --     Constitutional: Alert and oriented.  Somewhat frail-appearing but in no acute distress. Eyes: Conjunctivae are normal.  No scleral icterus. Head: Atraumatic. Nose: No congestion/rhinnorhea. Mouth/Throat: Mucous membranes are dry. Neck: Normal range of motion.  Cardiovascular: Normal rate, regular rhythm. Good peripheral circulation. Respiratory: Normal respiratory effort.  No retractions. Lungs CTAB. Gastrointestinal: Soft with mild epigastric tenderness.  No distention.   Genitourinary: No flank tenderness. Musculoskeletal: Extremities warm and well perfused.  Neurologic:  Normal speech and language. No gross focal neurologic deficits are appreciated.  Skin:  Skin is warm and dry. No rash noted. Psychiatric: Mood and affect are normal. Speech and behavior are normal.  ____________________________________________   LABS (all labs ordered are listed, but only abnormal results are displayed)  Labs Reviewed  BASIC METABOLIC PANEL - Abnormal; Notable for the following components:      Result Value   Sodium 129 (*)    Chloride 96 (*)    CO2 20 (*)    Glucose, Bld 127 (*)    Creatinine, Ser 1.29 (*)    Calcium 8.3 (*)    GFR, Estimated 46 (*)    All other components within normal limits  CBC WITH DIFFERENTIAL/PLATELET - Abnormal; Notable for the following components:   MCH 35.4 (*)    All other components within normal limits  URINALYSIS, COMPLETE (UACMP) WITH MICROSCOPIC - Abnormal; Notable for the following components:  Color, Urine YELLOW (*)    APPearance HAZY (*)    Specific Gravity, Urine >1.046 (*)    Ketones, ur 20 (*)    Protein, ur 30 (*)    Leukocytes,Ua LARGE (*)    All other components within normal limits  URINE CULTURE  HEPATIC FUNCTION PANEL  LACTIC ACID, PLASMA  PHOSPHORUS  MAGNESIUM   ____________________________________________  EKG  ED ECG REPORT I, Arta Silence, the attending physician, personally viewed and interpreted this ECG.  Date: 10/11/2020 EKG Time: 1251 Rate: 89 Rhythm: normal sinus rhythm QRS Axis: normal Intervals: Borderline QTc ST/T Wave abnormalities: normal Narrative Interpretation: no evidence of acute ischemia  ____________________________________________  RADIOLOGY  Chest x-ray interpreted by me shows no focal consolidation or edema  CT abdomen/pelvis:  IMPRESSION:  1. Postsurgical changes of left-sided colostomy and right-sided loop  ileostomy. Diffuse dilation of small bowel  loops measuring up to 4.1  cm with gradual transition to normal caliber and collapsed state of  the distal small bowel. The distal ileal loops downstream to the  loop ileostomy are collapsed. Findings are consistent with  small-bowel obstruction, likely due to adhesions.  2. Increased free fluid in the abdomen and pelvis.  3. Multiple too small to be actually characterize by CT  hypoattenuated circumscribed nodules throughout the liver.  4. Scattered areas of ground-glass consolidation in the right lung  base, which may represent infectious or inflammatory changes.    ____________________________________________   PROCEDURES  Procedure(s) performed: No  Procedures  Critical Care performed: No ____________________________________________   INITIAL IMPRESSION / ASSESSMENT AND PLAN / ED COURSE  Pertinent labs & imaging results that were available during my care of the patient were reviewed by me and considered in my medical decision making (see chart for details).   65 year old female with PMH as noted above presents with recurrent nausea and vomiting as well as upper abdominal pain over the last day.  I reviewed the past medical records in [place].  The patient was just admitted earlier this week and discharged 2 days ago for a small bowel obstruction.  She also tested positive for COVID.  She was treated nonoperatively and did not require an NG tube.  She also had AKI which resolved.  Her most recent chemotherapy was before the admission.  On exam the patient is somewhat frail and chronically ill-appearing but in no acute distress.  She is borderline tachycardic with otherwise normal vital signs.  The abdomen is soft with mild epigastric tenderness.  Differential includes recurrent SBO, volvulus, gastritis, gastroenteritis, pancreatitis, other pulmonary cause, colitis, diverticulitis.  We will obtain lab work-up and a CT.  The patient declines oral contrast and states that she cannot  tolerate it.  ----------------------------------------- 2:28 PM on 10/11/2020 -----------------------------------------  CT reveals evidence of a small bowel obstruction.  I consulted Dr. Hampton Abbot from general surgery who recommends placing an NG tube due to distention of the stomach on the CT.  The patient remains hemodynamically stable.  Her lab work-up is overall reassuring except for hyponatremia likely due to vomiting.  I consulted Dr. Tobie Poet from the hospitalist service for admission.  ____________________________________________   FINAL CLINICAL IMPRESSION(S) / ED DIAGNOSES  Final diagnoses:  Small bowel obstruction ([place])  Hyponatremia      NEW MEDICATIONS STARTED DURING THIS VISIT:  New Prescriptions   No medications on file     Note:  This document was prepared using Dragon voice recognition software and may include unintentional dictation errors.    Arta Silence,  MD 10/11/20 1428

## 2020-10-11 NOTE — ED Triage Notes (Signed)
Pt c/o vomiting and weakness- pt was admitted here and released Wed for a SBO- pt started throwing up again yesterday- pt does have covid, and is a cancer pt

## 2020-10-11 NOTE — Progress Notes (Signed)
Patient's NG tube advanced 4cm as per radiology recommendation. Pt tolerated well.

## 2020-10-11 NOTE — ED Notes (Signed)
Patient c/o vomiting and abdominal pain that started today. Was discharged from hospital Wednesday for SBO.

## 2020-10-12 ENCOUNTER — Encounter: Payer: Self-pay | Admitting: Internal Medicine

## 2020-10-12 ENCOUNTER — Observation Stay: Payer: Medicare Other

## 2020-10-12 DIAGNOSIS — E876 Hypokalemia: Secondary | ICD-10-CM | POA: Diagnosis present

## 2020-10-12 DIAGNOSIS — Z9049 Acquired absence of other specified parts of digestive tract: Secondary | ICD-10-CM | POA: Diagnosis not present

## 2020-10-12 DIAGNOSIS — K5651 Intestinal adhesions [bands], with partial obstruction: Secondary | ICD-10-CM | POA: Diagnosis present

## 2020-10-12 DIAGNOSIS — E878 Other disorders of electrolyte and fluid balance, not elsewhere classified: Secondary | ICD-10-CM | POA: Diagnosis present

## 2020-10-12 DIAGNOSIS — N170 Acute kidney failure with tubular necrosis: Secondary | ICD-10-CM | POA: Diagnosis present

## 2020-10-12 DIAGNOSIS — E860 Dehydration: Secondary | ICD-10-CM | POA: Diagnosis present

## 2020-10-12 DIAGNOSIS — Z933 Colostomy status: Secondary | ICD-10-CM | POA: Diagnosis not present

## 2020-10-12 DIAGNOSIS — K56609 Unspecified intestinal obstruction, unspecified as to partial versus complete obstruction: Secondary | ICD-10-CM | POA: Diagnosis not present

## 2020-10-12 DIAGNOSIS — U071 COVID-19: Secondary | ICD-10-CM | POA: Diagnosis present

## 2020-10-12 DIAGNOSIS — F419 Anxiety disorder, unspecified: Secondary | ICD-10-CM | POA: Diagnosis present

## 2020-10-12 DIAGNOSIS — F32A Depression, unspecified: Secondary | ICD-10-CM | POA: Diagnosis present

## 2020-10-12 DIAGNOSIS — R8290 Unspecified abnormal findings in urine: Secondary | ICD-10-CM | POA: Diagnosis present

## 2020-10-12 DIAGNOSIS — C799 Secondary malignant neoplasm of unspecified site: Secondary | ICD-10-CM | POA: Diagnosis present

## 2020-10-12 DIAGNOSIS — I10 Essential (primary) hypertension: Secondary | ICD-10-CM | POA: Diagnosis present

## 2020-10-12 DIAGNOSIS — I2510 Atherosclerotic heart disease of native coronary artery without angina pectoris: Secondary | ICD-10-CM | POA: Diagnosis present

## 2020-10-12 DIAGNOSIS — E871 Hypo-osmolality and hyponatremia: Secondary | ICD-10-CM | POA: Diagnosis present

## 2020-10-12 DIAGNOSIS — M5136 Other intervertebral disc degeneration, lumbar region: Secondary | ICD-10-CM | POA: Diagnosis present

## 2020-10-12 DIAGNOSIS — D539 Nutritional anemia, unspecified: Secondary | ICD-10-CM | POA: Diagnosis present

## 2020-10-12 DIAGNOSIS — E785 Hyperlipidemia, unspecified: Secondary | ICD-10-CM | POA: Diagnosis present

## 2020-10-12 DIAGNOSIS — E861 Hypovolemia: Secondary | ICD-10-CM | POA: Diagnosis present

## 2020-10-12 DIAGNOSIS — K219 Gastro-esophageal reflux disease without esophagitis: Secondary | ICD-10-CM | POA: Diagnosis present

## 2020-10-12 DIAGNOSIS — Z87891 Personal history of nicotine dependence: Secondary | ICD-10-CM | POA: Diagnosis not present

## 2020-10-12 DIAGNOSIS — Z8542 Personal history of malignant neoplasm of other parts of uterus: Secondary | ICD-10-CM | POA: Diagnosis not present

## 2020-10-12 LAB — BASIC METABOLIC PANEL
Anion gap: 9 (ref 5–15)
BUN: 13 mg/dL (ref 8–23)
CO2: 21 mmol/L — ABNORMAL LOW (ref 22–32)
Calcium: 7.1 mg/dL — ABNORMAL LOW (ref 8.9–10.3)
Chloride: 105 mmol/L (ref 98–111)
Creatinine, Ser: 0.86 mg/dL (ref 0.44–1.00)
GFR, Estimated: >60 mL/min (ref >60)
Glucose, Bld: 81 mg/dL (ref 70–99)
Potassium: 3.4 mmol/L — ABNORMAL LOW (ref 3.5–5.1)
Sodium: 135 mmol/L (ref 135–145)

## 2020-10-12 LAB — CBC
HCT: 34.6 % — ABNORMAL LOW (ref 36.0–46.0)
Hemoglobin: 11.9 g/dL — ABNORMAL LOW (ref 12.0–15.0)
MCH: 34.9 pg — ABNORMAL HIGH (ref 26.0–34.0)
MCHC: 34.4 g/dL (ref 30.0–36.0)
MCV: 101.5 fL — ABNORMAL HIGH (ref 80.0–100.0)
Platelets: 163 10*3/uL (ref 150–400)
RBC: 3.41 MIL/uL — ABNORMAL LOW (ref 3.87–5.11)
RDW: 12.1 % (ref 11.5–15.5)
WBC: 7.2 10*3/uL (ref 4.0–10.5)
nRBC: 0 % (ref 0.0–0.2)

## 2020-10-12 LAB — PHOSPHORUS: Phosphorus: 1.6 mg/dL — ABNORMAL LOW (ref 2.5–4.6)

## 2020-10-12 LAB — MAGNESIUM: Magnesium: 1.6 mg/dL — ABNORMAL LOW (ref 1.7–2.4)

## 2020-10-12 MED ORDER — POTASSIUM CHLORIDE 10 MEQ/100ML IV SOLN
10.0000 meq | INTRAVENOUS | Status: AC
  Administered 2020-10-12 (×4): 10 meq via INTRAVENOUS
  Filled 2020-10-12 (×4): qty 100

## 2020-10-12 MED ORDER — ENOXAPARIN SODIUM 40 MG/0.4ML IJ SOSY
40.0000 mg | PREFILLED_SYRINGE | INTRAMUSCULAR | Status: DC
  Filled 2020-10-12: qty 0.4

## 2020-10-12 MED ORDER — ACETAMINOPHEN 500 MG PO TABS
500.0000 mg | ORAL_TABLET | Freq: Four times a day (QID) | ORAL | Status: AC | PRN
  Administered 2020-10-13 – 2020-10-14 (×2): 500 mg via ORAL
  Filled 2020-10-12 (×3): qty 1

## 2020-10-12 MED ORDER — METOCLOPRAMIDE HCL 5 MG/ML IJ SOLN
5.0000 mg | Freq: Three times a day (TID) | INTRAMUSCULAR | Status: DC
  Administered 2020-10-12 – 2020-10-14 (×5): 5 mg via INTRAVENOUS
  Filled 2020-10-12 (×4): qty 2

## 2020-10-12 MED ORDER — MAGNESIUM SULFATE 4 GM/100ML IV SOLN
4.0000 g | Freq: Once | INTRAVENOUS | Status: AC
  Administered 2020-10-12: 4 g via INTRAVENOUS
  Filled 2020-10-12: qty 100

## 2020-10-12 MED ORDER — MORPHINE SULFATE (PF) 2 MG/ML IV SOLN
2.0000 mg | INTRAVENOUS | Status: DC | PRN
  Administered 2020-10-12 – 2020-10-14 (×6): 2 mg via INTRAVENOUS
  Filled 2020-10-12 (×6): qty 1

## 2020-10-12 MED ORDER — DEXTROSE-NACL 5-0.9 % IV SOLN: INTRAVENOUS | Status: DC

## 2020-10-12 MED ORDER — POTASSIUM PHOSPHATES 15 MMOLE/5ML IV SOLN
30.0000 mmol | Freq: Once | INTRAVENOUS | Status: AC
  Administered 2020-10-12: 14:00:00 30 mmol via INTRAVENOUS
  Filled 2020-10-12: qty 10

## 2020-10-12 NOTE — Progress Notes (Signed)
10/12/2020  Subjective: No acute events overnight.  The wall suction apparently was having issues and there was minimal NG output recorded.  The patient tells me today that she's very frustrated with her care and how long it takes for things to get done, whether the IV machine is beeping, time to get ice chips, and the suction not working.  There is still ostomy function and the patient reports that she is feeling better this morning.  Vital signs: Temp:  [98.1 F (36.7 C)-99.2 F (37.3 C)] 99.2 F (37.3 C) (10/09 0740) Pulse Rate:  [92-124] 100 (10/09 0740) Resp:  [16-20] 17 (10/09 0740) BP: (129-155)/(63-99) 129/63 (10/09 0740) SpO2:  [99 %-100 %] 99 % (10/09 0740)   Intake/Output: 10/08 0701 - 10/09 0700 In: 1463.7 [P.O.:20; I.V.:1443.7] Out: 2110 [Urine:1800; Emesis/NG output:10; Stool:300] Last BM Date: (P) 10/11/20 (colostomy bag)  Physical Exam: Constitutional: No acute distress Abdomen: Soft, still mildly distended, with no tenderness to palpation today.  NG tube in place with green clear fluid in the tubing and canister.  After manipulating the tubing better, was able to get the suction back to working with low amount of output.  Labs:  Recent Labs    10/11/20 1142 10/12/20 0446  WBC 9.2 7.2  HGB 14.3 11.9*  HCT 40.1 34.6*  PLT 189 163   Recent Labs    10/11/20 1142 10/12/20 0446  NA 129* 135  K 3.8 3.4*  CL 96* 105  CO2 20* 21*  GLUCOSE 127* 81  BUN 22 13  CREATININE 1.29* 0.86  CALCIUM 8.3* 7.1*   No results for input(s): LABPROT, INR in the last 72 hours.  Imaging: DG Chest 2 View  Result Date: 10/11/2020 CLINICAL DATA:  Shortness of breath. EXAM: CHEST - 2 VIEW COMPARISON:  None. FINDINGS: The heart size and mediastinal contours are within normal limits. Both lungs are clear. The visualized skeletal structures are unremarkable. IMPRESSION: No active cardiopulmonary disease. Electronically Signed   By: Abelardo Diesel M.D.   On: 10/11/2020 12:58   CT  ABDOMEN PELVIS W CONTRAST  Result Date: 10/11/2020 CLINICAL DATA:  Abdominal pain. EXAM: CT ABDOMEN AND PELVIS WITH CONTRAST TECHNIQUE: Multidetector CT imaging of the abdomen and pelvis was performed using the standard protocol following bolus administration of intravenous contrast. CONTRAST:  120mL OMNIPAQUE IOHEXOL 350 MG/ML SOLN COMPARISON:  October 06, 2020 FINDINGS: Lower chest: Scattered areas of ground-glass consolidation in the right lung base. Hepatobiliary: Normal attenuation of the liver. Multiple too small to be actually characterize by CT hypoattenuated circumscribed nodules throughout the liver. Gallbladder is grossly normal. Pancreas: Unremarkable. No pancreatic ductal dilatation or surrounding inflammatory changes. Spleen: Normal in size without focal abnormality. Adrenals/Urinary Tract: Adrenal glands are unremarkable. Kidneys are normal, without renal calculi, focal lesion, or hydronephrosis. Bladder is unremarkable. Stomach/Bowel: Normal appearance of the stomach. Diffuse dilation of small bowel loops measuring up to 4.1 cm. Gradual transition to normal caliber and collapsed state of the distal small bowel. Loop ileostomy is unchanged and is not the source of obstruction. The distal ileal loops downstream to the loop ileostomy are collapsed. Prior left-sided colonic resection with Hartmann's pouch in the left mid upper abdomen. Decompressed residual colon. Vascular/Lymphatic: Aortic atherosclerosis. No enlarged abdominal or pelvic lymph nodes. Reproductive: Status post hysterectomy. No adnexal masses. Other: Increased free fluid in the abdomen and pelvis. Musculoskeletal: Lumbosacral spine spondylosis. IMPRESSION: 1. Postsurgical changes of left-sided colostomy and right-sided loop ileostomy. Diffuse dilation of small bowel loops measuring up to 4.1 cm  with gradual transition to normal caliber and collapsed state of the distal small bowel. The distal ileal loops downstream to the loop ileostomy  are collapsed. Findings are consistent with small-bowel obstruction, likely due to adhesions. 2. Increased free fluid in the abdomen and pelvis. 3. Multiple too small to be actually characterize by CT hypoattenuated circumscribed nodules throughout the liver. 4. Scattered areas of ground-glass consolidation in the right lung base, which may represent infectious or inflammatory changes. Aortic Atherosclerosis (ICD10-I70.0). Electronically Signed   By: Fidela Salisbury M.D.   On: 10/11/2020 12:36   DG Abd Portable 1V  Result Date: 10/12/2020 CLINICAL DATA:  65 year old female with history of small-bowel obstruction. EXAM: PORTABLE ABDOMEN - 1 VIEW COMPARISON:  Abdominal radiograph 10/11/2020. FINDINGS: Nasogastric tube has been advanced, now with tip in the distal body of the stomach. Stomach is decompressed. Several loops of dilated small bowel are again noted in the central abdomen, largest of which measures up to 4.6 cm in diameter. There is a paucity of colonic gas and stool noted. No definite pneumoperitoneum noted on these supine images. Iodinated contrast material is evident in the urinary bladder. Right lower quadrant colostomy. IMPRESSION: 1. Bowel gas pattern remains compatible with small bowel obstruction, as above. 2. No pneumoperitoneum. 3. Tip of nasogastric tube is in the distal body of the stomach. Electronically Signed   By: Vinnie Langton M.D.   On: 10/12/2020 05:16   DG Abd Portable 1V  Result Date: 10/11/2020 CLINICAL DATA:  Enteric catheter placement, ileus, vomiting EXAM: PORTABLE ABDOMEN - 1 VIEW COMPARISON:  10/09/2020 FINDINGS: Supine frontal view of the abdomen and pelvis demonstrates enteric catheter tip passing below diaphragm overlying the gastric fundus. Side port projects just above the gastroesophageal junction, recommend advancing at least 3-4 cm to ensure placement within the gastric lumen. Continued gaseous distention of the small bowel measuring up to 4 cm consistent  with small-bowel obstruction. Ostomy right lower quadrant. Excreted contrast identified within the kidneys, ureters, and bladder from preceding CT. IMPRESSION: 1. Enteric catheter tip projecting over the gastric fundus. Recommend advancing catheter 3-4 cm to ensure side port placement within the gastric lumen. 2. Continued small bowel obstruction. Electronically Signed   By: Randa Ngo M.D.   On: 10/11/2020 16:24    Assessment/Plan: This is a 65 y.o. female with likely partial small bowel obstruction.  - Patient's KUB from this morning still shows distended loop of small bowel consistent with small bowel obstruction.  I have personally viewed the images and agree with the findings.  However the patient feels significantly better with less distention, softer abdomen, and no further pain. - The patient's main complaint is really her NG tube at this point.  She feels miserable with it in place and is frustrated that it was not working well overnight.  Given that she still having ostomy output, we will do a clamping trial of her NG tube and check residuals in 4 hours.  If residuals are less than 150 mL, then we will be able to remove the NG tube more safely.  We would likely keep her n.p.o. today as a precaution given her KUB findings. - We will continue following along with you.  Will discuss with Dr. Lysle Pearl tomorrow given that he had seen her on her previous admission.   Melvyn Neth, [facility]

## 2020-10-12 NOTE — Progress Notes (Signed)
Jeanette Allen  MWN:[id number] DOB: 1955-11-11 DOA: 10/11/2020 PCP: Jeanette Berry, NP    Brief Narrative:  65 year old who lives independently with a history of stage IV endometrial cancer s/p debulking surgery March 2022 with colectomy and ileostomy, depression, GERD, serous carcinoma of the pelvis, HLD, HTN, and CAD who presented to the ED with severe generalized weakness and intractable vomiting x24 hours.  In the ED she was found to have a sodium of 129, BUN of 22, creatinine 1.29, and a CT abdomen consistent with a small bowel obstruction.  Disposition: Anticipate discharge home   Date of Positive COVID Test:  10/06/20 - (last day of isolation 10/16/2020)  Consultants:  General surgery  Code Status: FULL CODE  Antimicrobials:  Rocephin 10/8 >  DVT prophylaxis: Subcu heparin  Subjective: Patient known to me from her recent admission and discharged 10/09/2020.  Unfortunately her symptoms rapidly returned after going home.  Afebrile.  Vital signs stable.  Saturations 100% on room air.  No new complaints at the time of my visit.  States she is feeling better overall with decreased nausea and decreased abdominal discomfort.  No shortness of breath or chest pain  Assessment & Plan:  Small bowel obstruction Likely due to adhesions from extensive surgery - NG placed in ED and will be continued -General surgery following -n.p.o. -maximize electrolytes   Hypomagnesemia Supplement to goal of 2.0 to maximize bowel motility  Hypophosphatemia Supplement to goal of 3.0 to maximize bowel motility  Hypokalemia Supplement to goal of 4.0 to maximize bowel motility  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 Continue outpatient follow-up with oncology as previously planned  Acute kidney injury Most consistent with prerenal azotemia versus low-grade ATN -monitor with ongoing volume resuscitation  Hyponatremia Hypovolemic in nature -follow  with IV fluid support  Macrocytic anemia Check Q03 and folic acid levels  Abnormal UA Being treated with empiric Rocephin  Depression/anxiety Resume usual home medications when emesis allows  GERD Continue PPI  COVID-positive Tested + 10/06/2020 therefore will need to remain on isolation through 10/13   Family Communication:  Status is: Inpatient  Remains inpatient appropriate because:Inpatient level of care appropriate due to severity of illness  Dispo: The patient is from: Home              Anticipated d/c is to: Home              Patient currently is not medically stable to d/c.   Difficult to place patient No    Objective: Blood pressure 129/63, pulse 100, temperature 99.2 F (37.3 C), temperature source Oral, resp. rate 17, height 5\' 4"  (1.626 m), weight 68 kg, SpO2 99 %.  Intake/Output Summary (Last 24 hours) at 10/12/2020 0946 Last data filed at 10/12/2020 0400 Gross per 24 hour  Intake 1463.66 ml  Output 2110 ml  Net -646.34 ml   Filed Weights   10/11/20 1113  Weight: 68 kg    Examination: General: No acute respiratory distress Lungs: Clear to auscultation bilaterally without wheezes or crackles Cardiovascular: Regular rate and rhythm without murmur gallop or rub normal S1 and S2 Abdomen: Ostomy in place in right lower quadrant, nondistended, no mass, no rebound, soft Extremities: No significant cyanosis, clubbing, or edema bilateral lower extremities  CBC: Recent Labs  Lab 10/06/20 0915 10/07/20 0656 10/09/20 0458 10/11/20 1142 10/12/20 0446  WBC 9.2   < > 7.3 9.2 7.2  NEUTROABS 7.4  --   --  7.4  --  HGB 15.4*   < > 11.2* 14.3 11.9*  HCT 44.2   < > 32.1* 40.1 34.6*  MCV 101.1*   < > 101.9* 99.3 101.5*  PLT 191   < > 130* 189 163   < > = values in this interval not displayed.   Basic Metabolic Panel: Recent Labs  Lab 10/09/20 0458 10/11/20 1142 10/11/20 1413 10/12/20 0446 10/12/20 0819  NA 137 129*  --  135  --   K 3.5 3.8  --  3.4*   --   CL 108 96*  --  105  --   CO2 23 20*  --  21*  --   GLUCOSE 85 127*  --  81  --   BUN 16 22  --  13  --   CREATININE 0.81 1.29*  --  0.86  --   CALCIUM 8.1* 8.3*  --  7.1*  --   MG 1.8  --  1.6*  --  1.6*  PHOS 1.9*  --  1.8*  --  1.6*   GFR: Estimated Creatinine Clearance: 60.9 mL/min (by C-G formula based on SCr of 0.86 mg/dL).  Liver Function Tests: Recent Labs  Lab 10/06/20 0915 10/07/20 0656 10/09/20 0458 10/11/20 1142  AST 24 24 27 21   ALT 22 21 24 21   ALKPHOS 81 70 64 67  BILITOT 0.8 1.8* 0.9 1.1  PROT 9.2* 7.6 6.4* 7.6  ALBUMIN 4.6 3.9 3.2* 4.1     Recent Results (from the past 240 hour(s))  Resp Panel by RT-PCR (Flu A&B, Covid) Nasopharyngeal Swab     Status: Abnormal   Collection Time: 10/06/20  9:40 PM   Specimen: Nasopharyngeal Swab; Nasopharyngeal(NP) swabs in vial transport medium  Result Value Ref Range Status   SARS Coronavirus 2 by RT PCR POSITIVE (A) NEGATIVE Final    Comment: RESULT CALLED TO, READ BACK BY AND VERIFIED WITH: Ammi Watts @2240  on 10/06/20 skl (NOTE) SARS-CoV-2 target nucleic acids are DETECTED.  The SARS-CoV-2 RNA is generally detectable in upper respiratory specimens during the acute phase of infection. Positive results are indicative of the presence of the identified virus, but do not rule out bacterial infection or co-infection with other pathogens not detected by the test. Clinical correlation with patient history and other diagnostic information is necessary to determine patient infection status. The expected result is Negative.  Fact Sheet for Patients: EntrepreneurPulse.com.au  Fact Sheet for Healthcare Providers: IncredibleEmployment.be  This test is not yet approved or cleared by the [place] FDA and  has been authorized for detection and/or diagnosis of SARS-CoV-2 by FDA under an Emergency Use Authorization (EUA).  This EUA will remain in effect (meaning this test can be u  sed) for the duration of  the COVID-19 declaration under Section 564(b)(1) of the Act, 21 U.S.C. section 360bbb-3(b)(1), unless the authorization is terminated or revoked sooner.     Influenza A by PCR NEGATIVE NEGATIVE Final   Influenza B by PCR NEGATIVE NEGATIVE Final    Comment: (NOTE) The Xpert Xpress SARS-CoV-2/FLU/RSV plus assay is intended as an aid in the diagnosis of influenza from Nasopharyngeal swab specimens and should not be used as a sole basis for treatment. Nasal washings and aspirates are unacceptable for Xpert Xpress SARS-CoV-2/FLU/RSV testing.  Fact Sheet for Patients: EntrepreneurPulse.com.au  Fact Sheet for Healthcare Providers: IncredibleEmployment.be  This test is not yet approved or cleared by the [place] FDA and has been authorized for detection and/or diagnosis of SARS-CoV-2 by FDA under an  Emergency Use Authorization (EUA). This EUA will remain in effect (meaning this test can be used) for the duration of the COVID-19 declaration under Section 564(b)(1) of the Act, 21 U.S.C. section 360bbb-3(b)(1), unless the authorization is terminated or revoked.  Performed at [facility], [address] [place], [place] [address]   Resp Panel by RT-PCR (Flu A&B, Covid) Nasopharyngeal Swab     Status: Abnormal   Collection Time: 10/11/20  4:15 PM   Specimen: Nasopharyngeal Swab; Nasopharyngeal(NP) swabs in vial transport medium  Result Value Ref Range Status   SARS Coronavirus 2 by RT PCR POSITIVE (A) NEGATIVE Final    Comment: RESULT CALLED TO, READ BACK BY AND VERIFIED WITH: TIFFANY ROBINSON ON 10/11/20 AT 1751 QSD (NOTE) SARS-CoV-2 target nucleic acids are DETECTED.  The SARS-CoV-2 RNA is generally detectable in upper respiratory specimens during the acute phase of infection. Positive results are indicative of the presence of the identified virus, but do not rule out bacterial infection or co-infection  with other pathogens not detected by the test. Clinical correlation with patient history and other diagnostic information is necessary to determine patient infection status. The expected result is Negative.  Fact Sheet for Patients: EntrepreneurPulse.com.au  Fact Sheet for Healthcare Providers: IncredibleEmployment.be  This test is not yet approved or cleared by the [place] FDA and  has been authorized for detection and/or diagnosis of SARS-CoV-2 by FDA under an Emergency Use Authorization (EUA).  This EUA will remain in effect (meaning this test  can be used) for the duration of  the COVID-19 declaration under Section 564(b)(1) of the Act, 21 U.S.C. section 360bbb-3(b)(1), unless the authorization is terminated or revoked sooner.     Influenza A by PCR NEGATIVE NEGATIVE Final   Influenza B by PCR NEGATIVE NEGATIVE Final    Comment: (NOTE) The Xpert Xpress SARS-CoV-2/FLU/RSV plus assay is intended as an aid in the diagnosis of influenza from Nasopharyngeal swab specimens and should not be used as a sole basis for treatment. Nasal washings and aspirates are unacceptable for Xpert Xpress SARS-CoV-2/FLU/RSV testing.  Fact Sheet for Patients: EntrepreneurPulse.com.au  Fact Sheet for Healthcare Providers: IncredibleEmployment.be  This test is not yet approved or cleared by the [place] FDA and has been authorized for detection and/or diagnosis of SARS-CoV-2 by FDA under an Emergency Use Authorization (EUA). This EUA will remain in effect (meaning this test can be used) for the duration of the COVID-19 declaration under Section 564(b)(1) of the Act, 21 U.S.C. section 360bbb-3(b)(1), unless the authorization is terminated or revoked.  Performed at [facility], [address] [place], [place] [address]      Scheduled Meds:  heparin  5,000 Units Subcutaneous Q8H   pantoprazole  (PROTONIX) IV  40 mg Intravenous QAC breakfast   Continuous Infusions:  sodium chloride 125 mL/hr at 10/12/20 0007   cefTRIAXone (ROCEPHIN)  IV     potassium chloride 10 mEq (10/12/20 0818)   promethazine (PHENERGAN) injection (IM or IVPB)       LOS: 0 days   Cherene Altes, MD [facility] Office  [phone] Pager - Text Page per Shea Evans  If 7PM-7AM, please contact night-coverage per Amion 10/12/2020, 9:46 AM

## 2020-10-13 ENCOUNTER — Ambulatory Visit: Payer: Medicare Other | Admitting: Internal Medicine

## 2020-10-13 ENCOUNTER — Other Ambulatory Visit: Payer: Medicare Other

## 2020-10-13 ENCOUNTER — Inpatient Hospital Stay: Payer: Medicare Other

## 2020-10-13 LAB — CBC
HCT: 33 % — ABNORMAL LOW (ref 36.0–46.0)
Hemoglobin: 11.4 g/dL — ABNORMAL LOW (ref 12.0–15.0)
MCH: 34.9 pg — ABNORMAL HIGH (ref 26.0–34.0)
MCHC: 34.5 g/dL (ref 30.0–36.0)
MCV: 100.9 fL — ABNORMAL HIGH (ref 80.0–100.0)
Platelets: 147 10*3/uL — ABNORMAL LOW (ref 150–400)
RBC: 3.27 MIL/uL — ABNORMAL LOW (ref 3.87–5.11)
RDW: 12.2 % (ref 11.5–15.5)
WBC: 5.5 10*3/uL (ref 4.0–10.5)
nRBC: 0 % (ref 0.0–0.2)

## 2020-10-13 LAB — COMPREHENSIVE METABOLIC PANEL
ALT: 14 U/L (ref 0–44)
AST: 15 U/L (ref 15–41)
Albumin: 3 g/dL — ABNORMAL LOW (ref 3.5–5.0)
Alkaline Phosphatase: 45 U/L (ref 38–126)
Anion gap: 5 (ref 5–15)
BUN: <5 mg/dL — ABNORMAL LOW (ref 8–23)
CO2: 25 mmol/L (ref 22–32)
Calcium: 6.7 mg/dL — ABNORMAL LOW (ref 8.9–10.3)
Chloride: 105 mmol/L (ref 98–111)
Creatinine, Ser: 0.72 mg/dL (ref 0.44–1.00)
GFR, Estimated: >60 mL/min (ref >60)
Glucose, Bld: 110 mg/dL — ABNORMAL HIGH (ref 70–99)
Potassium: 3.7 mmol/L (ref 3.5–5.1)
Sodium: 135 mmol/L (ref 135–145)
Total Bilirubin: 0.6 mg/dL (ref 0.3–1.2)
Total Protein: 6 g/dL — ABNORMAL LOW (ref 6.5–8.1)

## 2020-10-13 LAB — RETICULOCYTES
Immature Retic Fract: 21.4 % — ABNORMAL HIGH (ref 2.3–15.9)
RBC.: 3.22 MIL/uL — ABNORMAL LOW (ref 3.87–5.11)
Retic Count, Absolute: 58.3 10*3/uL (ref 19.0–186.0)
Retic Ct Pct: 1.8 % (ref 0.4–3.1)

## 2020-10-13 LAB — URINE CULTURE: Culture: 70000 — AB

## 2020-10-13 LAB — IRON AND TIBC
Iron: 40 ug/dL (ref 28–170)
Saturation Ratios: 18 % (ref 10.4–31.8)
TIBC: 228 ug/dL — ABNORMAL LOW (ref 250–450)
UIBC: 188 ug/dL

## 2020-10-13 LAB — FOLATE: Folate: 16.3 ng/mL (ref >5.9)

## 2020-10-13 LAB — PHOSPHORUS: Phosphorus: 1.3 mg/dL — ABNORMAL LOW (ref 2.5–4.6)

## 2020-10-13 LAB — MAGNESIUM: Magnesium: 2.4 mg/dL (ref 1.7–2.4)

## 2020-10-13 LAB — FERRITIN: Ferritin: 408 ng/mL — ABNORMAL HIGH (ref 11–307)

## 2020-10-13 LAB — VITAMIN B12: Vitamin B-12: 274 pg/mL (ref 180–914)

## 2020-10-13 MED ORDER — FLUOXETINE HCL 20 MG PO CAPS
40.0000 mg | ORAL_CAPSULE | Freq: Every day | ORAL | Status: DC
  Administered 2020-10-13 – 2020-10-14 (×2): 40 mg via ORAL
  Filled 2020-10-13 (×2): qty 2

## 2020-10-13 MED ORDER — ONDANSETRON HCL 4 MG/2ML IJ SOLN
4.0000 mg | INTRAMUSCULAR | Status: DC | PRN
  Administered 2020-10-13 (×2): 4 mg via INTRAVENOUS
  Filled 2020-10-13 (×2): qty 2

## 2020-10-13 MED ORDER — POTASSIUM PHOSPHATES 15 MMOLE/5ML IV SOLN
30.0000 mmol | Freq: Once | INTRAVENOUS | Status: AC
  Administered 2020-10-13: 30 mmol via INTRAVENOUS
  Filled 2020-10-13: qty 10

## 2020-10-13 NOTE — Progress Notes (Signed)
Subjective:  CC: Jeanette Allen is a 65 y.o. female  Hospital stay day 1,   pSBO  HPI: No acute issues overnight.  Ostomy productive, no pain, but still nauseated.  Nausea controlled with meds.  ROS:  General: Denies weight loss, weight gain, fatigue, fevers, chills, and night sweats. Heart: Denies chest pain, palpitations, racing heart, irregular heartbeat, leg pain or swelling, and decreased activity tolerance. Respiratory: Denies breathing difficulty, shortness of breath, wheezing, cough, and sputum. GI: Denies change in appetite, heartburn, nausea, vomiting, constipation, diarrhea, and blood in stool. GU: Denies difficulty urinating, pain with urinating, urgency, frequency, blood in urine.   Objective:   Temp:  [98 F (36.7 C)-98.3 F (36.8 C)] 98 F (36.7 C) (10/10 1146) Pulse Rate:  [86-95] 86 (10/10 1146) Resp:  [17-20] 17 (10/10 1146) BP: (133-150)/(58-81) 141/74 (10/10 1146) SpO2:  [100 %] 100 % (10/10 1146)     Height: 5\' 4"  (162.6 cm) Weight: 68 kg BMI (Calculated): 25.73   Intake/Output this shift:   Intake/Output Summary (Last 24 hours) at 10/13/2020 1428 Last data filed at 10/13/2020 0500 Gross per 24 hour  Intake 1704.88 ml  Output 970 ml  Net 734.88 ml    Constitutional :  alert, cooperative, appears stated age, and no distress  Respiratory:  clear to auscultation bilaterally  Cardiovascular:  regular rate and rhythm  Gastrointestinal: soft, non-tender; bowel sounds normal; no masses,  no organomegaly. Ostomy productive of green liquid stool  Skin: Cool and moist.   Psychiatric: Normal affect, non-agitated, not confused       LABS:  CMP Latest Ref Rng & Units 10/13/2020 10/12/2020 10/11/2020  Glucose 70 - 99 mg/dL 110(H) 81 127(H)  BUN 8 - 23 mg/dL <5(L) 13 22  Creatinine 0.44 - 1.00 mg/dL 0.72 0.86 1.29(H)  Sodium 135 - 145 mmol/L 135 135 129(L)  Potassium 3.5 - 5.1 mmol/L 3.7 3.4(L) 3.8  Chloride 98 - 111 mmol/L 105 105 96(L)  CO2 22 - 32 mmol/L 25  21(L) 20(L)  Calcium 8.9 - 10.3 mg/dL 6.7(L) 7.1(L) 8.3(L)  Total Protein 6.5 - 8.1 g/dL 6.0(L) - 7.6  Total Bilirubin 0.3 - 1.2 mg/dL 0.6 - 1.1  Alkaline Phos 38 - 126 U/L 45 - 67  AST 15 - 41 U/L 15 - 21  ALT 0 - 44 U/L 14 - 21   CBC Latest Ref Rng & Units 10/13/2020 10/12/2020 10/11/2020  WBC 4.0 - 10.5 K/uL 5.5 7.2 9.2  Hemoglobin 12.0 - 15.0 g/dL 11.4(L) 11.9(L) 14.3  Hematocrit 36.0 - 46.0 % 33.0(L) 34.6(L) 40.1  Platelets 150 - 400 K/uL 147(L) 163 189    RADS: N/a Assessment:   pSBO.  Resolving.  Will resume clears and advance diet as tolerated.  Still unsure if this truly was a obstructive episode based on CT images, and no issues with constipation with this episode.  Definitely not a candidate for surgical exploration at this time.

## 2020-10-13 NOTE — Progress Notes (Signed)
Jeanette Allen  UQJ:[id number] DOB: 21-Jan-1955 DOA: 10/11/2020 PCP: Danelle Berry, NP    Brief Narrative:  37LH who lives independently with a history of stage IV endometrial cancer s/p debulking surgery March 2022 with colectomy and ileostomy, depression, GERD, serous carcinoma of the pelvis, HLD, HTN, and CAD who presented to the ED with severe generalized weakness and intractable vomiting x24 hours.  In the ED she was found to have a sodium of 129, BUN of 22, creatinine 1.29, and a CT abdomen consistent with a small bowel obstruction.  Disposition: Anticipate discharge home   Date of Positive COVID Test:  10/06/20 - (last day of isolation 10/16/2020)  Consultants:  General surgery  Code Status: FULL CODE  Antimicrobials:  Rocephin 10/8 >  DVT prophylaxis: Subcu heparin  Subjective: NG tube able to be removed yesterday afternoon with clinical improvement.  Afebrile.  Vital signs stable.  Electrolytes improving with exception of phosphorus. Oral intake going well thus far. Feels much better.   Assessment & Plan:  Small bowel obstruction Likely due to adhesions from extensive surgery - NG placed in ED and was able to be discontinued 10/9 afternoon - General Surgery following and is advancing diet - maximize electrolytes   Hypomagnesemia Supplemented to goal of 2.0 to maximize bowel motility  Hypophosphatemia Cont to supplement to goal of 3.0 to maximize bowel motility  Hypokalemia Supplement to goal of 4.0 to maximize bowel motility  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 Continue outpatient follow-up with Oncology as previously planned  Acute kidney injury Most consistent with prerenal azotemia versus low-grade ATN -resolved with simple volume resuscitation  Hyponatremia Hypovolemic in nature -corrected with volume resuscitation  Macrocytic anemia Folic acid level normal -B12 below ideal therefore will supplement  - will need recheck in 6-8 weeks   Abnormal UA Being treated with empiric Rocephin -urine culture with no growth thus far - d/c abx after 3 days of tx   Depression/anxiety Resume usual home medications today   GERD Continue PPI  COVID-positive Tested + 10/06/2020 therefore will need to remain on isolation through 10/13   Family Communication: No family present at time of exam Status is: Inpatient  Remains inpatient appropriate because:Inpatient level of care appropriate due to severity of illness  Dispo: The patient is from: Home              Anticipated d/c is to: Home              Patient currently is not medically stable to d/c.   Difficult to place patient No    Objective: Blood pressure 135/61, pulse 86, temperature 98.3 F (36.8 C), temperature source Oral, resp. rate 18, height 5\' 4"  (1.626 m), weight 68 kg, SpO2 100 %.  Intake/Output Summary (Last 24 hours) at 10/13/2020 0938 Last data filed at 10/13/2020 0500 Gross per 24 hour  Intake 1704.88 ml  Output 970 ml  Net 734.88 ml    Filed Weights   10/11/20 1113  Weight: 68 kg    Examination: General: No acute respiratory distress Lungs: Clear to auscultation bilaterally  Cardiovascular: Regular rate and rhythm without murmur  Abdomen: Ostomy in place in right lower quadrant, nondistended, no mass, no rebound, soft Extremities: No edema bilateral lower extremities  CBC: Recent Labs  Lab 10/11/20 1142 10/12/20 0446 10/13/20 0458  WBC 9.2 7.2 5.5  NEUTROABS 7.4  --   --   HGB 14.3 11.9* 11.4*  HCT 40.1 34.6*  33.0*  MCV 99.3 101.5* 100.9*  PLT 189 163 147*    Basic Metabolic Panel: Recent Labs  Lab 10/11/20 1142 10/11/20 1413 10/12/20 0446 10/12/20 0819 10/13/20 0458  NA 129*  --  135  --  135  K 3.8  --  3.4*  --  3.7  CL 96*  --  105  --  105  CO2 20*  --  21*  --  25  GLUCOSE 127*  --  81  --  110*  BUN 22  --  13  --  <5*  CREATININE 1.29*  --  0.86  --  0.72  CALCIUM 8.3*  --  7.1*   --  6.7*  MG  --  1.6*  --  1.6* 2.4  PHOS  --  1.8*  --  1.6* 1.3*    GFR: Estimated Creatinine Clearance: 65.5 mL/min (by C-G formula based on SCr of 0.72 mg/dL).  Liver Function Tests: Recent Labs  Lab 10/07/20 0656 10/09/20 0458 10/11/20 1142 10/13/20 0458  AST 24 27 21 15   ALT 21 24 21 14   ALKPHOS 70 64 67 45  BILITOT 1.8* 0.9 1.1 0.6  PROT 7.6 6.4* 7.6 6.0*  ALBUMIN 3.9 3.2* 4.1 3.0*      Recent Results (from the past 240 hour(s))  Resp Panel by RT-PCR (Flu A&B, Covid) Nasopharyngeal Swab     Status: Abnormal   Collection Time: 10/06/20  9:40 PM   Specimen: Nasopharyngeal Swab; Nasopharyngeal(NP) swabs in vial transport medium  Result Value Ref Range Status   SARS Coronavirus 2 by RT PCR POSITIVE (A) NEGATIVE Final    Comment: RESULT CALLED TO, READ BACK BY AND VERIFIED WITH: Ammi Watts @2240  on 10/06/20 skl (NOTE) SARS-CoV-2 target nucleic acids are DETECTED.  The SARS-CoV-2 RNA is generally detectable in upper respiratory specimens during the acute phase of infection. Positive results are indicative of the presence of the identified virus, but do not rule out bacterial infection or co-infection with other pathogens not detected by the test. Clinical correlation with patient history and other diagnostic information is necessary to determine patient infection status. The expected result is Negative.  Fact Sheet for Patients: EntrepreneurPulse.com.au  Fact Sheet for Healthcare Providers: IncredibleEmployment.be  This test is not yet approved or cleared by the [place] FDA and  has been authorized for detection and/or diagnosis of SARS-CoV-2 by FDA under an Emergency Use Authorization (EUA).  This EUA will remain in effect (meaning this test can be u sed) for the duration of  the COVID-19 declaration under Section 564(b)(1) of the Act, 21 U.S.C. section 360bbb-3(b)(1), unless the authorization is terminated or revoked  sooner.     Influenza A by PCR NEGATIVE NEGATIVE Final   Influenza B by PCR NEGATIVE NEGATIVE Final    Comment: (NOTE) The Xpert Xpress SARS-CoV-2/FLU/RSV plus assay is intended as an aid in the diagnosis of influenza from Nasopharyngeal swab specimens and should not be used as a sole basis for treatment. Nasal washings and aspirates are unacceptable for Xpert Xpress SARS-CoV-2/FLU/RSV testing.  Fact Sheet for Patients: EntrepreneurPulse.com.au  Fact Sheet for Healthcare Providers: IncredibleEmployment.be  This test is not yet approved or cleared by the [place] FDA and has been authorized for detection and/or diagnosis of SARS-CoV-2 by FDA under an Emergency Use Authorization (EUA). This EUA will remain in effect (meaning this test can be used) for the duration of the COVID-19 declaration under Section 564(b)(1) of the Act, 21 U.S.C. section 360bbb-3(b)(1), unless the authorization  is terminated or revoked.  Performed at [facility], [address] [place], [place] [address]   Urine Culture     Status: None (Preliminary result)   Collection Time: 10/11/20  1:02 PM   Specimen: Urine, Clean Catch  Result Value Ref Range Status   Specimen Description   Final    URINE, CLEAN CATCH Performed at [facility], [address] [place], [place] [address]    Special Requests   Final    NONE Performed at [facility], [address], [place], [place] [address]    Culture   Final    CULTURE REINCUBATED FOR BETTER GROWTH Performed at [facility], [place] [address], [place], [place] [address]    Report Status PENDING  Incomplete  Resp Panel by RT-PCR (Flu A&B, Covid) Nasopharyngeal Swab     Status: Abnormal   Collection Time: 10/11/20  4:15 PM   Specimen: Nasopharyngeal Swab; Nasopharyngeal(NP) swabs in vial transport medium  Result Value Ref Range Status   SARS Coronavirus 2 by RT PCR POSITIVE (A)  NEGATIVE Final    Comment: RESULT CALLED TO, READ BACK BY AND VERIFIED WITH: TIFFANY ROBINSON ON 10/11/20 AT 1751 QSD (NOTE) SARS-CoV-2 target nucleic acids are DETECTED.  The SARS-CoV-2 RNA is generally detectable in upper respiratory specimens during the acute phase of infection. Positive results are indicative of the presence of the identified virus, but do not rule out bacterial infection or co-infection with other pathogens not detected by the test. Clinical correlation with patient history and other diagnostic information is necessary to determine patient infection status. The expected result is Negative.  Fact Sheet for Patients: EntrepreneurPulse.com.au  Fact Sheet for Healthcare Providers: IncredibleEmployment.be  This test is not yet approved or cleared by the [place] FDA and  has been authorized for detection and/or diagnosis of SARS-CoV-2 by FDA under an Emergency Use Authorization (EUA).  This EUA will remain in effect (meaning this test  can be used) for the duration of  the COVID-19 declaration under Section 564(b)(1) of the Act, 21 U.S.C. section 360bbb-3(b)(1), unless the authorization is terminated or revoked sooner.     Influenza A by PCR NEGATIVE NEGATIVE Final   Influenza B by PCR NEGATIVE NEGATIVE Final    Comment: (NOTE) The Xpert Xpress SARS-CoV-2/FLU/RSV plus assay is intended as an aid in the diagnosis of influenza from Nasopharyngeal swab specimens and should not be used as a sole basis for treatment. Nasal washings and aspirates are unacceptable for Xpert Xpress SARS-CoV-2/FLU/RSV testing.  Fact Sheet for Patients: EntrepreneurPulse.com.au  Fact Sheet for Healthcare Providers: IncredibleEmployment.be  This test is not yet approved or cleared by the [place] FDA and has been authorized for detection and/or diagnosis of SARS-CoV-2 by FDA under an Emergency Use  Authorization (EUA). This EUA will remain in effect (meaning this test can be used) for the duration of the COVID-19 declaration under Section 564(b)(1) of the Act, 21 U.S.C. section 360bbb-3(b)(1), unless the authorization is terminated or revoked.  Performed at [facility], [address] [place], [place] [address]       Scheduled Meds:  enoxaparin (LOVENOX) injection  40 mg Subcutaneous Q24H   metoCLOPramide (REGLAN) injection  5 mg Intravenous Q8H   pantoprazole (PROTONIX) IV  40 mg Intravenous QAC breakfast   Continuous Infusions:  dextrose 5 % and 0.9% NaCl 50 mL/hr at 10/12/20 1645   promethazine (PHENERGAN) injection (IM or IVPB)       LOS: 1 day  Cherene Altes, MD [facility] Office  [phone] Pager - Text Page per Amion  If 7PM-7AM, please contact night-coverage per Amion 10/13/2020, 9:38 AM

## 2020-10-14 DIAGNOSIS — K56609 Unspecified intestinal obstruction, unspecified as to partial versus complete obstruction: Secondary | ICD-10-CM | POA: Diagnosis not present

## 2020-10-14 LAB — CBC
HCT: 31.2 % — ABNORMAL LOW (ref 36.0–46.0)
Hemoglobin: 11.3 g/dL — ABNORMAL LOW (ref 12.0–15.0)
MCH: 36.9 pg — ABNORMAL HIGH (ref 26.0–34.0)
MCHC: 36.2 g/dL — ABNORMAL HIGH (ref 30.0–36.0)
MCV: 102 fL — ABNORMAL HIGH (ref 80.0–100.0)
Platelets: 135 10*3/uL — ABNORMAL LOW (ref 150–400)
RBC: 3.06 MIL/uL — ABNORMAL LOW (ref 3.87–5.11)
RDW: 12.2 % (ref 11.5–15.5)
WBC: 5 10*3/uL (ref 4.0–10.5)
nRBC: 0 % (ref 0.0–0.2)

## 2020-10-14 LAB — BASIC METABOLIC PANEL
Anion gap: 7 (ref 5–15)
BUN: <5 mg/dL — ABNORMAL LOW (ref 8–23)
CO2: 26 mmol/L (ref 22–32)
Calcium: 7 mg/dL — ABNORMAL LOW (ref 8.9–10.3)
Chloride: 103 mmol/L (ref 98–111)
Creatinine, Ser: 0.84 mg/dL (ref 0.44–1.00)
GFR, Estimated: >60 mL/min (ref >60)
Glucose, Bld: 92 mg/dL (ref 70–99)
Potassium: 4.1 mmol/L (ref 3.5–5.1)
Sodium: 136 mmol/L (ref 135–145)

## 2020-10-14 LAB — MAGNESIUM: Magnesium: 2.2 mg/dL (ref 1.7–2.4)

## 2020-10-14 LAB — PHOSPHORUS: Phosphorus: 2 mg/dL — ABNORMAL LOW (ref 2.5–4.6)

## 2020-10-14 MED ORDER — ACETAMINOPHEN 500 MG PO TABS: 500.0000 mg | ORAL_TABLET | Freq: Four times a day (QID) | ORAL | 0 refills | Status: AC | PRN

## 2020-10-14 MED ORDER — CYANOCOBALAMIN 1000 MCG/ML IJ SOLN
1000.0000 ug | Freq: Every day | INTRAMUSCULAR | Status: DC
  Administered 2020-10-14: 1000 ug via SUBCUTANEOUS
  Filled 2020-10-14: qty 1

## 2020-10-14 MED ORDER — ONDANSETRON 4 MG PO TBDP
4.0000 mg | ORAL_TABLET | Freq: Three times a day (TID) | ORAL | Status: DC | PRN
  Administered 2020-10-14: 09:00:00 4 mg via ORAL
  Filled 2020-10-14 (×2): qty 1

## 2020-10-14 NOTE — Discharge Instructions (Signed)
Continue to consume only a full liquid diet for the next 24hours. Advance to soft and then regular foods as able if you are tolerating the full liquid diet w/o difficulty.

## 2020-10-14 NOTE — Consult Note (Signed)
[facility] CONSULT NOTE  Patient Care Team: Danelle Berry, NP as PCP - General (Nurse Practitioner) Clent Jacks, RN as Oncology Nurse Navigator Cammie Sickle, MD as Consulting Physician (Internal Medicine) Gillis Ends, MD as Referring Physician (Obstetrics)  CHIEF COMPLAINTS/PURPOSE OF CONSULTATION: Endometrial cancer  HISTORY OF PRESENTING ILLNESS:  Jeanette Allen 65 y.o.  female with metastatic/stage IV endometrial cancer is currently admitted to hospital for nausea vomiting abdominal discomfort.  Diagnosed with small bowel obstruction.  This was patient's second admission for the last week for similar complaints.  Patient s/p NG tube placement noted to have improvement of her symptoms.  NG tube was taken out yesterday.  Patient able to tolerate clear liquids today.  Awaiting surgical evaluation prior to discharge.  Patient sitting in the chair.  Denies any nausea.+  Review of Systems  Constitutional:  Positive for malaise/fatigue. Negative for chills, diaphoresis, fever and weight loss.  HENT:  Negative for nosebleeds and sore throat.   Eyes:  Negative for double vision.  Respiratory:  Negative for cough, hemoptysis, sputum production, shortness of breath and wheezing.   Cardiovascular:  Negative for chest pain, palpitations, orthopnea and leg swelling.  Gastrointestinal:  Positive for abdominal pain and nausea. Negative for blood in stool, constipation, diarrhea, heartburn, melena and vomiting.  Genitourinary:  Negative for dysuria, frequency and urgency.  Musculoskeletal:  Negative for back pain and joint pain.  Skin: Negative.  Negative for itching and rash.  Neurological:  Negative for dizziness, tingling, focal weakness, weakness and headaches.  Endo/Heme/Allergies:  Does not bruise/bleed easily.  Psychiatric/Behavioral:  Negative for depression. The patient is not nervous/anxious and does not have insomnia.     MEDICAL HISTORY:   Past Medical History:  Diagnosis Date   Arthritis    Cancer ([place])    Hypertension     SURGICAL HISTORY: Past Surgical History:  Procedure Laterality Date   APPENDECTOMY     COLONOSCOPY WITH PROPOFOL N/A 10/01/2019   Procedure: COLONOSCOPY WITH PROPOFOL;  Surgeon: Lin Landsman, MD;  Location: [facility] ENDOSCOPY;  Service: Gastroenterology;  Laterality: N/A;   ESOPHAGOGASTRODUODENOSCOPY (EGD) WITH PROPOFOL N/A 09/12/2019   Procedure: ESOPHAGOGASTRODUODENOSCOPY (EGD) WITH PROPOFOL;  Surgeon: Lin Landsman, MD;  Location: [facility] ENDOSCOPY;  Service: Gastroenterology;  Laterality: N/A;    SOCIAL HISTORY: Social History   Socioeconomic History   Marital status: Married    Spouse name: Not on file   Number of children: Not on file   Years of education: Not on file   Highest education level: Not on file  Occupational History   Not on file  Tobacco Use   Smoking status: Former    Types: Cigarettes   Smokeless tobacco: Never  Vaping Use   Vaping Use: Never used  Substance and Sexual Activity   Alcohol use: Not Currently   Drug use: Never   Sexual activity: Yes  Other Topics Concern   Not on file  Social History Narrative   Lives with husband; near [place]. Worked Network engineer at [employer]; smoke 1/2 ppd/slowed; no alcohol; 3 children [2 boys; one girl- [employer]]   Social Determinants of Health   Financial Resource Strain: Not on file  Food Insecurity: Not on file  Transportation Needs: Not on file  Physical Activity: Not on file  Stress: Not on file  Social Connections: Not on file  Intimate Partner Violence: Not on file    FAMILY HISTORY: Family History  Problem Relation Age of Onset  Diabetes Maternal Grandmother    Hypertension Maternal Grandmother    Stroke Maternal Grandmother     ALLERGIES:  has No Known Allergies.  MEDICATIONS:  No current facility-administered medications for this encounter.   Current Outpatient Medications  Medication  Sig Dispense Refill   ergocalciferol (VITAMIN D2) 1.25 MG (50000 UT) capsule Take 50,000 Units by mouth every Monday.     FLUoxetine (PROZAC) 40 MG capsule Take 1 capsule (40 mg total) by mouth daily. 30 capsule 3   omeprazole (PRILOSEC) 40 MG capsule Take 1 capsule (40 mg total) by mouth 2 (two) times daily before a meal. (Patient taking differently: Take 40 mg by mouth daily.) 60 capsule 1   ondansetron (ZOFRAN) 8 MG tablet One pill every 8 hours as needed for nausea/vomitting. 60 tablet 1   ondansetron (ZOFRAN-ODT) 4 MG disintegrating tablet Take 4 mg by mouth every 8 (eight) hours as needed for nausea or vomiting.     prochlorperazine (COMPAZINE) 10 MG tablet Take 1 tablet (10 mg total) by mouth every 6 (six) hours as needed for nausea or vomiting. 40 tablet 1   acetaminophen (TYLENOL) 500 MG tablet Take 1 tablet (500 mg total) by mouth every 6 (six) hours as needed for mild pain, fever or headache (or Fever >/= 101). 30 tablet 0   lenvatinib 20 mg daily dose (LENVIMA, 20 MG DAILY DOSE,) 2 x 10 MG capsule Take 2 capsules (20 mg total) by mouth daily. (Patient not taking: No sig reported) 60 capsule 1      .  PHYSICAL EXAMINATION:  Vitals:   10/14/20 0858 10/14/20 1223  BP: 133/80 (!) 148/75  Pulse: (!) 104 (!) 101  Resp: 18 20  Temp: 98.3 F (36.8 C) 98.1 F (36.7 C)  SpO2: 99% 100%   Filed Weights   10/11/20 1113  Weight: 150 lb (68 kg)    Physical Exam Vitals and nursing note reviewed.  HENT:     Head: Normocephalic and atraumatic.     Mouth/Throat:     Pharynx: Oropharynx is clear.  Eyes:     Extraocular Movements: Extraocular movements intact.     Pupils: Pupils are equal, round, and reactive to light.  Cardiovascular:     Rate and Rhythm: Normal rate and regular rhythm.  Pulmonary:     Comments: Decreased breath sounds bilaterally.  Abdominal:     Palpations: Abdomen is soft.  Musculoskeletal:        General: Normal range of motion.     Cervical back: Normal  range of motion.  Skin:    General: Skin is warm.  Neurological:     General: No focal deficit present.     Mental Status: She is alert and oriented to person, place, and time.  Psychiatric:        Behavior: Behavior normal.        Judgment: Judgment normal.     LABORATORY DATA:  I have reviewed the data as listed Lab Results  Component Value Date   WBC 5.0 10/14/2020   HGB 11.3 (L) 10/14/2020   HCT 31.2 (L) 10/14/2020   MCV 102.0 (H) 10/14/2020   PLT 135 (L) 10/14/2020   Recent Labs    10/09/20 0458 10/11/20 1142 10/12/20 0446 10/13/20 0458 10/14/20 0438  NA 137 129* 135 135 136  K 3.5 3.8 3.4* 3.7 4.1  CL 108 96* 105 105 103  CO2 23 20* 21* 25 26  GLUCOSE 85 127* 81 110* 92  BUN 16 22 13  <  5* <5*  CREATININE 0.81 1.29* 0.86 0.72 0.84  CALCIUM 8.1* 8.3* 7.1* 6.7* 7.0*  GFRNONAA >60 46* >60 >60 >60  PROT 6.4* 7.6  --  6.0*  --   ALBUMIN 3.2* 4.1  --  3.0*  --   AST 27 21  --  15  --   ALT 24 21  --  14  --   ALKPHOS 64 67  --  45  --   BILITOT 0.9 1.1  --  0.6  --   BILIDIR  --  0.2  --   --   --   IBILI  --  0.9  --   --   --     RADIOGRAPHIC STUDIES: I have personally reviewed the radiological images as listed and agreed with the findings in the report. DG Chest 2 View  Result Date: 10/11/2020 CLINICAL DATA:  Shortness of breath. EXAM: CHEST - 2 VIEW COMPARISON:  None. FINDINGS: The heart size and mediastinal contours are within normal limits. Both lungs are clear. The visualized skeletal structures are unremarkable. IMPRESSION: No active cardiopulmonary disease. Electronically Signed   By: Abelardo Diesel M.D.   On: 10/11/2020 12:58   MR LUMBAR SPINE W WO CONTRAST  Result Date: 09/30/2020 CLINICAL DATA:  Low back pain for over 6 weeks. Cancer suspected (undergoing treatment for endometrial cancer. EXAM: MRI LUMBAR SPINE WITHOUT AND WITH CONTRAST TECHNIQUE: Multiplanar and multiecho pulse sequences of the lumbar spine were obtained without and with intravenous  contrast. CONTRAST:  80mL GADAVIST GADOBUTROL 1 MMOL/ML IV SOLN COMPARISON:  Five lumbar type vertebrae FINDINGS: Segmentation:  Standard. Alignment:  Mild retrolisthesis at L3-4.  Mild scoliosis Vertebrae: Benign heterogeneity of marrow. No evidence of fracture or aggressive bone lesion Conus medullaris and cauda equina: Conus extends to the L1-2 level. Conus and cauda equina appear normal. Paraspinal and other soft tissues: No evidence of perispinal mass or inflammation Disc levels: T12- L1: Unremarkable. L1-L2: Unremarkable. L2-L3: Mild disc narrowing and bulging L3-L4: Disc narrowing and bulging with right foraminal annular fissure. Mild retrolisthesis. Patent canal and foramina L4-L5: Disc narrowing and bulging.  Negative facets L5-S1:Degenerative facet spurring which is mild. Asymmetric left foraminal impingement from disc protrusion and disc narrowing best seen on sagittal images. IMPRESSION: 1. No acute finding or evidence of metastatic disease. 2. Lumbar spine degeneration with mild scoliosis and L3-4 retrolisthesis. No impingement to explain right leg symptoms. Focal stenosis at the left L5-S1 foramen. Electronically Signed   By: Jorje Guild M.D.   On: 09/30/2020 05:49   CT ABDOMEN PELVIS W CONTRAST  Result Date: 10/11/2020 CLINICAL DATA:  Abdominal pain. EXAM: CT ABDOMEN AND PELVIS WITH CONTRAST TECHNIQUE: Multidetector CT imaging of the abdomen and pelvis was performed using the standard protocol following bolus administration of intravenous contrast. CONTRAST:  129mL OMNIPAQUE IOHEXOL 350 MG/ML SOLN COMPARISON:  October 06, 2020 FINDINGS: Lower chest: Scattered areas of ground-glass consolidation in the right lung base. Hepatobiliary: Normal attenuation of the liver. Multiple too small to be actually characterize by CT hypoattenuated circumscribed nodules throughout the liver. Gallbladder is grossly normal. Pancreas: Unremarkable. No pancreatic ductal dilatation or surrounding inflammatory  changes. Spleen: Normal in size without focal abnormality. Adrenals/Urinary Tract: Adrenal glands are unremarkable. Kidneys are normal, without renal calculi, focal lesion, or hydronephrosis. Bladder is unremarkable. Stomach/Bowel: Normal appearance of the stomach. Diffuse dilation of small bowel loops measuring up to 4.1 cm. Gradual transition to normal caliber and collapsed state of the distal small bowel. Loop ileostomy is unchanged  and is not the source of obstruction. The distal ileal loops downstream to the loop ileostomy are collapsed. Prior left-sided colonic resection with Hartmann's pouch in the left mid upper abdomen. Decompressed residual colon. Vascular/Lymphatic: Aortic atherosclerosis. No enlarged abdominal or pelvic lymph nodes. Reproductive: Status post hysterectomy. No adnexal masses. Other: Increased free fluid in the abdomen and pelvis. Musculoskeletal: Lumbosacral spine spondylosis. IMPRESSION: 1. Postsurgical changes of left-sided colostomy and right-sided loop ileostomy. Diffuse dilation of small bowel loops measuring up to 4.1 cm with gradual transition to normal caliber and collapsed state of the distal small bowel. The distal ileal loops downstream to the loop ileostomy are collapsed. Findings are consistent with small-bowel obstruction, likely due to adhesions. 2. Increased free fluid in the abdomen and pelvis. 3. Multiple too small to be actually characterize by CT hypoattenuated circumscribed nodules throughout the liver. 4. Scattered areas of ground-glass consolidation in the right lung base, which may represent infectious or inflammatory changes. Aortic Atherosclerosis (ICD10-I70.0). Electronically Signed   By: Fidela Salisbury M.D.   On: 10/11/2020 12:36   CT ABDOMEN PELVIS W CONTRAST  Result Date: 10/06/2020 CLINICAL DATA:  Abdominal pain and findings suggestive of small-bowel obstruction on recent plain film examination. EXAM: CT ABDOMEN AND PELVIS WITH CONTRAST TECHNIQUE:  Multidetector CT imaging of the abdomen and pelvis was performed using the standard protocol following bolus administration of intravenous contrast. CONTRAST:  31mL OMNIPAQUE IOHEXOL 350 MG/ML SOLN COMPARISON:  Plain film from earlier in the same day, PET-CT from 08/25/2020 FINDINGS: Lower chest: No acute abnormality. Hepatobiliary: Tiny dependent gallstone is noted. No wall thickening is seen. Liver is unremarkable. Pancreas: Unremarkable. No pancreatic ductal dilatation or surrounding inflammatory changes. Spleen: Normal in size without focal abnormality. Adrenals/Urinary Tract: Adrenal glands are within normal limits. Kidneys demonstrate a normal enhancement pattern bilaterally. Normal excretion of contrast is noted on delayed images. The bladder is partially distended. 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 More proximal colon appears within normal limits. The appendix has been surgically removed. Stomach is within normal limits. Proximal jejunum appears unremarkable. Dilated loops of distal jejunum and proximal ileum are noted with air and fluid identified. No discrete transition zone is identified. There is a more gradual tapering from dilated to nondilated small bowel. This is well before the loop ileostomy. The distal ileal loops beyond the loop ileostomy are within normal limits. These changes may be related to adhesions from prior surgery. No discrete mass lesion is noted. Vascular/Lymphatic: Aortic atherosclerosis. No enlarged abdominal or pelvic lymph nodes. Reproductive: Status post hysterectomy. No adnexal masses. Other: Mild free fluid is noted within the abdomen. No discrete peritoneal deposits are seen. Musculoskeletal: Mild degenerative changes of lumbar spine are noted. IMPRESSION: Changes consistent with prior hysterectomy as well as left-sided colostomy and right-sided loop ileostomy. Some dilated fluid and air-filled loops of small bowel are noted without  discrete transition zone. These changes are likely related to adhesions with mild partial small bowel obstruction. Continued follow-up is recommended. Cholelithiasis without complicating factors. Slight increase in free fluid when compared with the prior PET-CT. No discrete peritoneal implants are noted. Electronically Signed   By: Inez Catalina M.D.   On: 10/06/2020 21:33   DG Abd 2 Views  Result Date: 10/06/2020 CLINICAL DATA:  Abdominal pain, history of endometrial cancer EXAM: ABDOMEN - 2 VIEW COMPARISON:  CT chest abdomen pelvis 07/08/2020 FINDINGS: Dilated small bowel loops in the mid abdomen. Paucity of colonic gas. Findings consistent with small bowel obstruction. Ostomy RIGHT lower  quadrant. No bowel wall thickening or free air. Scattered pelvic phleboliths without urinary tract calcification. Bones demineralized. IMPRESSION: Dilated small bowel loops and paucity of colonic gas consistent with small bowel obstruction. These results will be called to the ordering clinician or representative by the Radiologist Assistant, and communication documented in the PACS or [employer]. Electronically Signed   By: Lavonia Dana M.D.   On: 10/06/2020 12:32   DG Abd Portable 1V  Result Date: 10/13/2020 CLINICAL DATA:  Small-bowel obstruction EXAM: PORTABLE ABDOMEN - 1 VIEW COMPARISON:  10/12/2020 FINDINGS: Enteric tube is no longer present. Decreased small bowel distension. Right lower quadrant ostomy. IMPRESSION: Decreased small bowel distension. Enteric tube is no longer present. Electronically Signed   By: Macy Mis M.D.   On: 10/13/2020 08:38   DG Abd Portable 1V  Result Date: 10/12/2020 CLINICAL DATA:  65 year old female with history of small-bowel obstruction. EXAM: PORTABLE ABDOMEN - 1 VIEW COMPARISON:  Abdominal radiograph 10/11/2020. FINDINGS: Nasogastric tube has been advanced, now with tip in the distal body of the stomach. Stomach is decompressed. Several loops of dilated small bowel are  again noted in the central abdomen, largest of which measures up to 4.6 cm in diameter. There is a paucity of colonic gas and stool noted. No definite pneumoperitoneum noted on these supine images. Iodinated contrast material is evident in the urinary bladder. Right lower quadrant colostomy. IMPRESSION: 1. Bowel gas pattern remains compatible with small bowel obstruction, as above. 2. No pneumoperitoneum. 3. Tip of nasogastric tube is in the distal body of the stomach. Electronically Signed   By: Vinnie Langton M.D.   On: 10/12/2020 05:16   DG Abd Portable 1V  Result Date: 10/11/2020 CLINICAL DATA:  Enteric catheter placement, ileus, vomiting EXAM: PORTABLE ABDOMEN - 1 VIEW COMPARISON:  10/09/2020 FINDINGS: Supine frontal view of the abdomen and pelvis demonstrates enteric catheter tip passing below diaphragm overlying the gastric fundus. Side port projects just above the gastroesophageal junction, recommend advancing at least 3-4 cm to ensure placement within the gastric lumen. Continued gaseous distention of the small bowel measuring up to 4 cm consistent with small-bowel obstruction. Ostomy right lower quadrant. Excreted contrast identified within the kidneys, ureters, and bladder from preceding CT. IMPRESSION: 1. Enteric catheter tip projecting over the gastric fundus. Recommend advancing catheter 3-4 cm to ensure side port placement within the gastric lumen. 2. Continued small bowel obstruction. Electronically Signed   By: Randa Ngo M.D.   On: 10/11/2020 16:24   DG Abd Portable 1V  Result Date: 10/09/2020 CLINICAL DATA:  Small bowel obstruction EXAM: PORTABLE ABDOMEN - 1 VIEW COMPARISON:  Radiograph dated October 07, 2020. FINDINGS: Numerous dilated gas-filled loops of small bowel are seen throughout the abdomen, degree of distention is similar to prior exam. Enteric contrast material is no longer seen. No evidence of free air, although supine positioning limits evaluation. No acute osseous  abnormality. IMPRESSION: Numerous dilated gas-filled loops of small bowel are seen throughout the abdomen, degree of distention is similar to prior exam. Enteric contrast material is no longer seen. Electronically Signed   By: Yetta Glassman M.D.   On: 10/09/2020 10:18   DG Abd Portable 1V-Small Bowel Obstruction Protocol-initial, 8 hr delay  Result Date: 10/07/2020 CLINICAL DATA:  65 year old female with possible small-bowel obstruction on CT 2 days ago. EXAM: PORTABLE ABDOMEN - 1 VIEW COMPARISON:  CT Abdomen and Pelvis 10/06/2020 and earlier. FINDINGS: Right lower quadrant ileostomy on the comparison CT. Portable AP supine view at 0902 hours.  Oral contrast appears to be present throughout the small bowel now to the level of the ileostomy. Right lower quadrant loops remain relatively decompressed as before. Excreted IV contrast also in the urinary bladder. Incidental pelvic phleboliths. There is some residual oral contrast within the stomach. Stable visualized osseous structures. Right lung base appears negative. IMPRESSION: Evidence that oral contrast has reached the right lower quadrant ileostomy on this image suggesting an incomplete or resolving bowel obstruction. Electronically Signed   By: Genevie Ann M.D.   On: 10/07/2020 09:33    Endometrial cancer [facility]) #65 year old female patient with endometrial cancer-with recurrence is currently admitted to hospital for nausea vomiting-imaging for history of small bowel obstruction  # STAGE IV-High-grade serous carcinoma endometrial]  August 2022-PET scan no obvious evidence of any progression noted; minimal residual nodularity in the left upper quadrant without any clear metabolic uptake.  CT scan on admission-evidence of small bowel obstruction- see below.   #Abdominal pain nausea vomiting-s/p NG tube; improved.  Appreciate surgical evaluation recommendations.  #COVID-positive; asymptomatic.  Recommendations:   #Long discussion with patient that she is  to get started on therapy as soon as possible as patient/clinically stable.  Discussed with surgery and the primary team.  As patient is being discharged today-reasonable to get started on immunotherapy/Keytruda tomorrow as planned.  We will plan to start lenvatinib-once patient symptoms of bowel obstruction completely resolve.  Discussed with the team at the cancer center-plan proceed with Keytruda on 10/12-in a isolation room.  #The above plan of care was discussed with the patient and her daughter in detail.  Daughter had questions about surgical options, which I would defer to surgical team.  # Thank you Dr. Phineas Douglas allowing me to participate in the care of your pleasant patient. Please do not hesitate to contact me with questions or concerns in the interim.  # I reviewed the blood work- with the patient in detail; also reviewed the imaging independently [as summarized above]; and with the patient in detail.   All questions were answered. The patient knows to call the clinic with any problems, questions or concerns.     Cammie Sickle, MD 10/14/2020 10:42 PM

## 2020-10-14 NOTE — Discharge Summary (Signed)
DISCHARGE SUMMARY  Jeanette Allen  MR#: [id number]  DOB:[birth year]  Date of Admission: 10/11/2020 Date of Discharge: 10/14/2020  Attending Physician:Greenley Martone Hennie Duos, MD  Patient's MWN:[id number], Jeanette Rowan, NP  Consults: Oncology  General Surgery   Disposition: D/C home   Date of Positive COVID Test: 10/06/20 - (last day of isolation 10/16/2020)  Follow-up Appts:  Follow-up Information     Jeanette Berry, NP Follow up.   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  In the ED she was found to have a sodium of 129, BUN of 22, creatinine 1.29, and a CT abdomen consistent with a small bowel obstruction.  Hospital Course:  Recurrent Small bowel obstruction Likely due to adhesions from extensive surgery - NG placed in ED and was able to be discontinued 10/9 afternoon - General Surgery followed - diet advanced w/o incident - maximized electrolytes -patient tolerating oral intake by 10/11 and very anxious to go home ASAP -patient encouraged to continue a full liquid diet for another day or 2 and then very slowly advance at home depending upon how she felt    Hypomagnesemia Supplemented to goal of 2.0 to maximize bowel motility   Hypophosphatemia Supplemented during hospital stay to maximize bowel motility   Hypokalemia Supplemented during hospital stay   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 Continue outpatient follow-up with Oncology as previously planned -oncology evaluated the patient during this hospital stay and is planning a short-term follow-up to initiate treatment   Acute kidney injury Most consistent with prerenal azotemia versus low-grade ATN -resolved with simple volume resuscitation   Hyponatremia Hypovolemic in nature -corrected with volume resuscitation   Macrocytic anemia Folic acid level normal -B12 below ideal therefore supplemented via SQ route- will need recheck in 6-8 weeks    Abnormal UA Was treated with empiric Rocephin x 3 days - urine culture with no growth   Depression/anxiety Resumed usual home medications today    GERD Continue PPI   COVID-positive Tested + 10/06/2020 therefore will need to remain on isolation through 10/13  Allergies as of 10/14/2020   No Known Allergies      Medication List     STOP taking these medications    metoCLOPramide 5 MG tablet Commonly known as: REGLAN       TAKE these medications    acetaminophen 500 MG tablet Commonly known as: TYLENOL Take 1 tablet (500 mg total) by mouth every 6 (six) hours as needed for mild pain, fever or headache (or Fever >/= 101). What changed:  how much to take reasons to take this   ergocalciferol 1.25 MG (50000 UT) capsule Commonly known as: VITAMIN D2 Take 50,000 Units by mouth every Monday.   FLUoxetine 40 MG capsule Commonly known as: PROzac Take 1  capsule (40 mg total) by mouth daily.   Lenvima (20 MG Daily Dose) 2 x 10 MG capsule Generic drug: lenvatinib 20 mg daily dose Take 2 capsules (20 mg total) by mouth daily.   omeprazole 40 MG capsule Commonly known as:  PRILOSEC Take 1 capsule (40 mg total) by mouth 2 (two) times daily before a meal. What changed: when to take this   ondansetron 4 MG disintegrating tablet Commonly known as: ZOFRAN-ODT Take 4 mg by mouth every 8 (eight) hours as needed for nausea or vomiting.   ondansetron 8 MG tablet Commonly known as: ZOFRAN One pill every 8 hours as needed for nausea/vomitting.   prochlorperazine 10 MG tablet Commonly known as: COMPAZINE Take 1 tablet (10 mg total) by mouth every 6 (six) hours as needed for nausea or vomiting.        Day of Discharge BP (!) 148/75 (BP Location: Left Arm)   Pulse (!) 101   Temp 98.1 F (36.7 C)   Resp 20   Ht 5\' 4"  (1.626 m)   Wt 68 kg   SpO2 100%   BMI 25.75 kg/m   Physical Exam: General: No acute respiratory distress Lungs: Clear to auscultation bilaterally without wheezes or crackles Cardiovascular: Regular rate and rhythm without murmur gallop or rub normal S1 and S2 Abdomen: Nontender, nondistended, soft, bowel sounds positive, no rebound, no ascites, no appreciable mass Extremities: No significant cyanosis, clubbing, or edema bilateral lower extremities  Basic Metabolic Panel: Recent Labs  Lab 10/09/20 0458 10/11/20 1142 10/11/20 1413 10/12/20 0446 10/12/20 0819 10/13/20 0458 10/14/20 0438  NA 137 129*  --  135  --  135 136  K 3.5 3.8  --  3.4*  --  3.7 4.1  CL 108 96*  --  105  --  105 103  CO2 23 20*  --  21*  --  25 26  GLUCOSE 85 127*  --  81  --  110* 92  BUN 16 22  --  13  --  <5* <5*  CREATININE 0.81 1.29*  --  0.86  --  0.72 0.84  CALCIUM 8.1* 8.3*  --  7.1*  --  6.7* 7.0*  MG 1.8  --  1.6*  --  1.6* 2.4 2.2  PHOS 1.9*  --  1.8*  --  1.6* 1.3* 2.0*    Liver Function Tests: Recent Labs  Lab 10/09/20 0458 10/11/20 1142 10/13/20 0458  AST 27 21 15   ALT 24 21 14   ALKPHOS 64 67 45  BILITOT 0.9 1.1 0.6  PROT 6.4* 7.6 6.0*  ALBUMIN 3.2* 4.1 3.0*    CBC: Recent Labs  Lab 10/09/20 0458 10/11/20 1142 10/12/20 0446  10/13/20 0458 10/14/20 0438  WBC 7.3 9.2 7.2 5.5 5.0  NEUTROABS  --  7.4  --   --   --   HGB 11.2* 14.3 11.9* 11.4* 11.3*  HCT 32.1* 40.1 34.6* 33.0* 31.2*  MCV 101.9* 99.3 101.5* 100.9* 102.0*  PLT 130* 189 163 147* 135*    Recent Results (from the past 240 hour(s))  Resp Panel by RT-PCR (Flu A&B, Covid) Nasopharyngeal Swab     Status: Abnormal   Collection Time: 10/06/20  9:40 PM   Specimen: Nasopharyngeal Swab; Nasopharyngeal(NP) swabs in vial transport medium  Result Value Ref Range Status   SARS Coronavirus 2 by RT PCR POSITIVE (A) NEGATIVE Final    Comment: RESULT CALLED TO, READ BACK BY AND VERIFIED WITH: Ammi Watts @2240  on 10/06/20 skl (NOTE)  SARS-CoV-2 target nucleic acids are DETECTED.  The SARS-CoV-2 RNA is generally detectable in upper respiratory specimens during the acute phase of infection. Positive results are indicative of the presence of the identified virus, but do not rule out bacterial infection or co-infection with other pathogens not detected by the test. Clinical correlation with patient history and other diagnostic information is necessary to determine patient infection status. The expected result is Negative.  Fact Sheet for Patients: EntrepreneurPulse.com.au  Fact Sheet for Healthcare Providers: IncredibleEmployment.be  This test is not yet approved or cleared by the [place] FDA and  has been authorized for detection and/or diagnosis of SARS-CoV-2 by FDA under an Emergency Use Authorization (EUA).  This EUA will remain in effect (meaning this test can be u sed) for the duration of  the COVID-19 declaration under Section 564(b)(1) of the Act, 21 U.S.C. section 360bbb-3(b)(1), unless the authorization is terminated or revoked sooner.     Influenza A by PCR NEGATIVE NEGATIVE Final   Influenza B by PCR NEGATIVE NEGATIVE Final    Comment: (NOTE) The Xpert Xpress SARS-CoV-2/FLU/RSV plus assay is intended as  an aid in the diagnosis of influenza from Nasopharyngeal swab specimens and should not be used as a sole basis for treatment. Nasal washings and aspirates are unacceptable for Xpert Xpress SARS-CoV-2/FLU/RSV testing.  Fact Sheet for Patients: EntrepreneurPulse.com.au  Fact Sheet for Healthcare Providers: IncredibleEmployment.be  This test is not yet approved or cleared by the [place] FDA and has been authorized for detection and/or diagnosis of SARS-CoV-2 by FDA under an Emergency Use Authorization (EUA). This EUA will remain in effect (meaning this test can be used) for the duration of the COVID-19 declaration under Section 564(b)(1) of the Act, 21 U.S.C. section 360bbb-3(b)(1), unless the authorization is terminated or revoked.  Performed at [facility], [address], [place], [place] [address]   Urine Culture     Status: Abnormal   Collection Time: 10/11/20  1:02 PM   Specimen: Urine, Clean Catch  Result Value Ref Range Status   Specimen Description   Final    URINE, CLEAN CATCH Performed at [facility] [address] [place], [place] [address]    Special Requests   Final    NONE Performed at [facility], [address] [place], [place] [address]    Culture (A)  Final    70,000 COLONIES/mL LACTOBACILLUS SPECIES Standardized susceptibility testing for this organism is not available. Performed at [facility], [place] [address] [place], [place] [address]    Report Status 10/13/2020 FINAL  Final  Resp Panel by RT-PCR (Flu A&B, Covid) Nasopharyngeal Swab     Status: Abnormal   Collection Time: 10/11/20  4:15 PM   Specimen: Nasopharyngeal Swab; Nasopharyngeal(NP) swabs in vial transport medium  Result Value Ref Range Status   SARS Coronavirus 2 by RT PCR POSITIVE (A) NEGATIVE Final    Comment: RESULT CALLED TO, READ BACK BY AND VERIFIED WITH: TIFFANY ROBINSON ON 10/11/20 AT 1751  QSD (NOTE) SARS-CoV-2 target nucleic acids are DETECTED.  The SARS-CoV-2 RNA is generally detectable in upper respiratory specimens during the acute phase of infection. Positive results are indicative of the presence of the identified virus, but do not rule out bacterial infection or co-infection with other pathogens not detected by the test. Clinical correlation with patient history and other diagnostic information is necessary to determine patient infection status. The expected result is Negative.  Fact Sheet for Patients: EntrepreneurPulse.com.au  Fact Sheet  for Healthcare Providers: IncredibleEmployment.be  This test is not yet approved or cleared by the [place] and  has been authorized for detection and/or diagnosis of SARS-CoV-2 by FDA under an Emergency Use Authorization (EUA).  This EUA will remain in effect (meaning this test  can be used) for the duration of  the COVID-19 declaration under Section 564(b)(1) of the Act, 21 U.S.C. section 360bbb-3(b)(1), unless the authorization is terminated or revoked sooner.     Influenza A by PCR NEGATIVE NEGATIVE Final   Influenza B by PCR NEGATIVE NEGATIVE Final    Comment: (NOTE) The Xpert Xpress SARS-CoV-2/FLU/RSV plus assay is intended as an aid in the diagnosis of influenza from Nasopharyngeal swab specimens and should not be used as a sole basis for treatment. Nasal washings and aspirates are unacceptable for Xpert Xpress SARS-CoV-2/FLU/RSV testing.  Fact Sheet for Patients: EntrepreneurPulse.com.au  Fact Sheet for Healthcare Providers: IncredibleEmployment.be  This test is not yet approved or cleared by the [place] FDA and has been authorized for detection and/or diagnosis of SARS-CoV-2 by FDA under an Emergency Use Authorization (EUA). This EUA will remain in effect (meaning this test can be used) for the duration of the COVID-19  declaration under Section 564(b)(1) of the Act, 21 U.S.C. section 360bbb-3(b)(1), unless the authorization is terminated or revoked.  Performed at [facility], [address] [place], [place] [address]       Time spent in discharge (includes decision making & examination of pt): 35 minutes  10/14/2020, 1:55 PM   Cherene Altes, MD [facility] Office  [phone]

## 2020-10-14 NOTE — Assessment & Plan Note (Signed)
#  65 year old female patient with endometrial cancer-with recurrence is currently admitted to hospital for nausea vomiting-imaging for history of small bowel obstruction  # STAGE IV-High-grade serous carcinoma endometrial]  August 2022-PET scan no obvious evidence of any progression noted; minimal residual nodularity in the left upper quadrant without any clear metabolic uptake.  CT scan on admission-evidence of small bowel obstruction- see below.   #Abdominal pain nausea vomiting-s/p NG tube; improved.  Appreciate surgical evaluation recommendations.  #COVID-positive; asymptomatic.  Recommendations:   #Long discussion with patient that she is to get started on therapy as soon as possible as patient/clinically stable.  Discussed with surgery and the primary team.  As patient is being discharged today-reasonable to get started on immunotherapy/Keytruda tomorrow as planned.  We will plan to start lenvatinib-once patient symptoms of bowel obstruction completely resolve.  Discussed with the team at the cancer center-plan proceed with Keytruda on 10/12-in a isolation room.  #The above plan of care was discussed with the patient and her daughter in detail.  Daughter had questions about surgical options, which I would defer to surgical team.  # Thank you Dr. Phineas Douglas allowing me to participate in the care of your pleasant patient. Please do not hesitate to contact me with questions or concerns in the interim.  # I reviewed the blood work- with the patient in detail; also reviewed the imaging independently [as summarized above]; and with the patient in detail.

## 2020-10-15 ENCOUNTER — Encounter: Payer: Self-pay | Admitting: Internal Medicine

## 2020-10-15 ENCOUNTER — Other Ambulatory Visit: Payer: Self-pay

## 2020-10-15 ENCOUNTER — Inpatient Hospital Stay: Payer: Medicare Other

## 2020-10-15 ENCOUNTER — Inpatient Hospital Stay (HOSPITAL_BASED_OUTPATIENT_CLINIC_OR_DEPARTMENT_OTHER): Payer: Medicare Other | Admitting: Internal Medicine

## 2020-10-15 ENCOUNTER — Other Ambulatory Visit: Payer: Self-pay | Admitting: *Deleted

## 2020-10-15 VITALS — BP 128/72 | HR 95 | Temp 97.1°F | Resp 18 | Wt 150.0 lb

## 2020-10-15 DIAGNOSIS — C541 Malignant neoplasm of endometrium: Secondary | ICD-10-CM

## 2020-10-15 DIAGNOSIS — Z87891 Personal history of nicotine dependence: Secondary | ICD-10-CM | POA: Diagnosis not present

## 2020-10-15 DIAGNOSIS — K56609 Unspecified intestinal obstruction, unspecified as to partial versus complete obstruction: Secondary | ICD-10-CM | POA: Diagnosis not present

## 2020-10-15 DIAGNOSIS — I10 Essential (primary) hypertension: Secondary | ICD-10-CM | POA: Diagnosis not present

## 2020-10-15 DIAGNOSIS — Z7189 Other specified counseling: Secondary | ICD-10-CM

## 2020-10-15 DIAGNOSIS — F418 Other specified anxiety disorders: Secondary | ICD-10-CM | POA: Diagnosis not present

## 2020-10-15 DIAGNOSIS — Z5112 Encounter for antineoplastic immunotherapy: Secondary | ICD-10-CM | POA: Diagnosis not present

## 2020-10-15 DIAGNOSIS — R110 Nausea: Secondary | ICD-10-CM | POA: Diagnosis not present

## 2020-10-15 DIAGNOSIS — R5383 Other fatigue: Secondary | ICD-10-CM

## 2020-10-15 LAB — COMPREHENSIVE METABOLIC PANEL
ALT: 17 U/L (ref 0–44)
AST: 25 U/L (ref 15–41)
Albumin: 3.6 g/dL (ref 3.5–5.0)
Alkaline Phosphatase: 58 U/L (ref 38–126)
Anion gap: 10 (ref 5–15)
BUN: 10 mg/dL (ref 8–23)
CO2: 25 mmol/L (ref 22–32)
Calcium: 7.6 mg/dL — ABNORMAL LOW (ref 8.9–10.3)
Chloride: 100 mmol/L (ref 98–111)
Creatinine, Ser: 0.82 mg/dL (ref 0.44–1.00)
GFR, Estimated: >60 mL/min (ref >60)
Glucose, Bld: 127 mg/dL — ABNORMAL HIGH (ref 70–99)
Potassium: 3.6 mmol/L (ref 3.5–5.1)
Sodium: 135 mmol/L (ref 135–145)
Total Bilirubin: 0.4 mg/dL (ref 0.3–1.2)
Total Protein: 7.3 g/dL (ref 6.5–8.1)

## 2020-10-15 LAB — TSH: TSH: 1.15 u[IU]/mL (ref 0.350–4.500)

## 2020-10-15 MED ORDER — LORAZEPAM 0.5 MG PO TABS
1.0000 mg | ORAL_TABLET | Freq: Once | ORAL | Status: AC
  Administered 2020-10-15: 1 mg via ORAL
  Filled 2020-10-15: qty 2

## 2020-10-15 MED ORDER — SODIUM CHLORIDE 0.9 % IV SOLN
200.0000 mg | Freq: Once | INTRAVENOUS | Status: AC
  Administered 2020-10-15: 200 mg via INTRAVENOUS
  Filled 2020-10-15: qty 8

## 2020-10-15 MED ORDER — SODIUM CHLORIDE 0.9 % IV SOLN
Freq: Once | INTRAVENOUS | Status: AC
  Filled 2020-10-15: qty 250

## 2020-10-15 MED ORDER — LORAZEPAM 0.5 MG PO TABS: 0.5000 mg | ORAL_TABLET | Freq: Three times a day (TID) | ORAL | 0 refills | Status: DC | PRN

## 2020-10-15 NOTE — Patient Instructions (Signed)
[facility] [place]   Discharge Instructions: Thank you for choosing [facility] to provide your oncology and hematology care.  If you have a lab appointment with the Cancer Center, please go directly to the Cancer Center and check in at the registration area.  Wear comfortable clothing and clothing appropriate for easy access to any Portacath or PICC line.   We strive to give you quality time with your provider. You may need to reschedule your appointment if you arrive late (15 or more minutes).  Arriving late affects you and other patients whose appointments are after yours.  Also, if you miss three or more appointments without notifying the office, you may be dismissed from the clinic at the provider's discretion.      For prescription refill requests, have your pharmacy contact our office and allow 72 hours for refills to be completed.    Today you received the following chemotherapy and/or immunotherapy agents Keytruda  Pembrolizumab injection What is this medication? PEMBROLIZUMAB (pem broe liz ue mab) is a monoclonal antibody. It is used totreat certain types of cancer. This medicine may be used for other purposes; ask your health care provider orpharmacist if you have questions. COMMON BRAND NAME(S): Keytruda What should I tell my care team before I take this medication? They need to know if you have any of these conditions: autoimmune diseases like Crohn's disease, ulcerative colitis, or lupus have had or planning to have an allogeneic stem cell transplant (uses someone else's stem cells) history of organ transplant history of chest radiation nervous system problems like myasthenia gravis or Guillain-Barre syndrome an unusual or allergic reaction to pembrolizumab, other medicines, foods, dyes, or preservatives pregnant or trying to get pregnant breast-feeding How should I use this medication? This medicine is for infusion into a vein.  It is given by a health careprofessional in a hospital or clinic setting. A special MedGuide will be given to you before each treatment. Be sure to readthis information carefully each time. Talk to your pediatrician regarding the use of this medicine in children. While this drug may be prescribed for children as young as 6 months for selectedconditions, precautions do apply. Overdosage: If you think you have taken too much of this medicine contact apoison control center or emergency room at once. NOTE: This medicine is only for you. Do not share this medicine with others. What if I miss a dose? It is important not to miss your dose. Call your doctor or health careprofessional if you are unable to keep an appointment. What may interact with this medication? Interactions have not been studied. This list may not describe all possible interactions. Give your health care provider a list of all the medicines, herbs, non-prescription drugs, or dietary supplements you use. Also tell them if you smoke, drink alcohol, or use illegaldrugs. Some items may interact with your medicine. What should I watch for while using this medication? Your condition will be monitored carefully while you are receiving thismedicine. You may need blood work done while you are taking this medicine. Do not become pregnant while taking this medicine or for 4 months after stopping it. Women should inform their doctor if they wish to become pregnant or think they might be pregnant. There is a potential for serious side effects to an unborn child. Talk to your health care professional or pharmacist for more information. Do not breast-feed an infant while taking this medicine orfor 4 months after the last dose. What side   effects may I notice from receiving this medication? Side effects that you should report to your doctor or health care professionalas soon as possible: allergic reactions like skin rash, itching or hives, swelling of the  face, lips, or tongue bloody or black, tarry breathing problems changes in vision chest pain chills confusion constipation cough diarrhea dizziness or feeling faint or lightheaded fast or irregular heartbeat fever flushing joint pain low blood counts - this medicine may decrease the number of white blood cells, red blood cells and platelets. You may be at increased risk for infections and bleeding. muscle pain muscle weakness pain, tingling, numbness in the hands or feet persistent headache redness, blistering, peeling or loosening of the skin, including inside the mouth signs and symptoms of high blood sugar such as dizziness; dry mouth; dry skin; fruity breath; nausea; stomach pain; increased hunger or thirst; increased urination signs and symptoms of kidney injury like trouble passing urine or change in the amount of urine signs and symptoms of liver injury like dark urine, light-colored stools, loss of appetite, nausea, right upper belly pain, yellowing of the eyes or skin sweating swollen lymph nodes weight loss Side effects that usually do not require medical attention (report to yourdoctor or health care professional if they continue or are bothersome): decreased appetite hair loss tiredness This list may not describe all possible side effects. Call your doctor for medical advice about side effects. You may report side effects to FDA at1-800-FDA-1088. Where should I keep my medication? This drug is given in a hospital or clinic and will not be stored at home. NOTE: This sheet is a summary. It may not cover all possible information. If you have questions about this medicine, talk to your doctor, pharmacist, orhealth care provider.  2022 Elsevier/Gold Standard (2018-11-22 21:44:53)       To help prevent nausea and vomiting after your treatment, we encourage you to take your nausea medication as directed.  BELOW ARE SYMPTOMS THAT SHOULD BE REPORTED IMMEDIATELY: *FEVER  GREATER THAN 100.4 F (38 C) OR HIGHER *CHILLS OR SWEATING *NAUSEA AND VOMITING THAT IS NOT CONTROLLED WITH YOUR NAUSEA MEDICATION *UNUSUAL SHORTNESS OF BREATH *UNUSUAL BRUISING OR BLEEDING *URINARY PROBLEMS (pain or burning when urinating, or frequent urination) *BOWEL PROBLEMS (unusual diarrhea, constipation, pain near the anus) TENDERNESS IN MOUTH AND THROAT WITH OR WITHOUT PRESENCE OF ULCERS (sore throat, sores in mouth, or a toothache) UNUSUAL RASH, SWELLING OR PAIN  UNUSUAL VAGINAL DISCHARGE OR ITCHING   Items with * indicate a potential emergency and should be followed up as soon as possible or go to the Emergency Department if any problems should occur.  Please show the CHEMOTHERAPY ALERT CARD or IMMUNOTHERAPY ALERT CARD at check-in to the Emergency Department and triage nurse.  Should you have questions after your visit or need to cancel or reschedule your appointment, please contact [facility]  [phone] and follow the prompts.  Office hours are 8:00 a.m. to 4:30 p.m. Monday - Friday. Please note that voicemails left after 4:00 p.m. may not be returned until the following business day.  We are closed weekends and major holidays. You have access to a nurse at all times for urgent questions. Please call the main number to the clinic [phone] and follow the prompts.  For any non-urgent questions, you may also contact your provider using MyChart. We now offer e-Visits for anyone 18 and older to request care online for non-urgent symptoms. For details visit [email or web address].   Also download   the MyChart app! Go to the app store, search "MyChart", open the app, select [facility], and log in with your MyChart username and password.  Due to Covid, a mask is required upon entering the hospital/clinic. If you do not have a mask, one will be given to you upon arrival. For doctor visits, patients may have 1 support person aged 18 or older with  them. For treatment visits, patients cannot have anyone with them due to current Covid guidelines and our immunocompromised population.  

## 2020-10-15 NOTE — Progress Notes (Signed)
Pt received care / tx in private room as she tested positive at 4 and 9 days ago. Pt is asymptomatic.

## 2020-10-15 NOTE — Assessment & Plan Note (Addendum)
#   STAGE IV-High-grade serous carcinoma endometrial] pre-treatment-CEA 125 +1500   #10  CarboTaxol.-Currently on surveillance.  August 2022-PET scan no obvious evidence of any progression noted; minimal residual nodularity in the left upper quadrant without any clear metabolic uptake.  No new peritoneal disease noted.  However, Ca125-slightly elevated at 80.  However concern for clinical progression-given the recent admission to hospital for small bowel obstruction-see below  #Proceed with [facility] today cycle #1.  Labs adequate.  TSH normal.  Also start lenvatinib-however start with a lower dose 10 mg a day [given patient's nausea; recent admission to hospital for small bowel obstruction].  If tolerating well would increase the dose to 20 mg in the next 1 to 2 weeks.   I discussed the mechanism of action; The goal of therapy is palliative; and length of treatments are likely ongoing/based upon the results of the scans. Discussed the potential side effects of immunotherapy including but not limited to diarrhea; skin rash; elevated LFTs/endocrine abnormalities etc. EKG no evidence of any QT prolongation.  #Nausea/difficulty sleeping/anxiety-recommend Ativan.  Prescription sent.  #Recent small bowel obstruction-tends to admission to hospital-s/p conservative measures.  Monitor closely.  # fatgiue- depression-STABLE; on prozac to 40 mg/day   # DISPOSITION:  #  Follow up in 1 week- MD; labs- cbc/cmp; possible IV fluids.-Dr.B

## 2020-10-15 NOTE — Progress Notes (Signed)
[place] NOTE  Patient Care Team: Danelle Berry, NP as PCP - General (Nurse Practitioner) Clent Jacks, RN as Oncology Nurse Navigator Cammie Sickle, MD as Consulting Physician (Internal Medicine) Gillis Ends, MD as Referring Physician (Obstetrics)  CHIEF COMPLAINTS/PURPOSE OF CONSULTATION: high grade serous cancer   #  Oncology History Overview Note  #August 2021-ascitic fluid cytology-high-grade serous carcinoma of gynecologic origin; CT scan-behind the abdominal wall 4.6 x 2.1 mass ; within the mesentery 6.7 x 4.5 cm left of mid abdomen . BIOPSY of the peritoneal mass; high grade carcinoma ca-[id number] [Dr.Byrnett]; SEP 9th 2021- PET scan-shows peritoneal carcinomatosis; uterine uptake [Dr.Secord; unable to biopsy cervical stenosis] sigmoid colonic uptake concerning for malignancy.  No ovarian uptake.  CEA 125 +1300; CEA-2.    #Hepatitis C/ectopic pregnancy/PRBC transfusion 1995-untreated.[Dr.Vanga]  # 09/14/2019-neoadjuvant CARBO-TAXOL s/p #4 cycles-partial response; DEC 9th 2021-laparoscopy-surgery aborted given the need for multiple bowel resections; DEc 22nd, 2021-proceed with neoadjuvant carbotaxol; STARTING cycle #6- AUC-5 sec to persistent thrombocytopenia.  #Status post #7 CarboTaxol -March 2022-debulking surgery TAH/BSO; bowel resections; ileostomy/mucous fistula.  # April 13th-restart CarboTaxol No. 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 GENETICS- NEG; HRD-    # PALLIATIVE CARE EVALUATION:  # PAIN MANAGEMENT:    DIAGNOSIS: Uterine cancer  STAGE:   IV      ;  GOALS: control  CURRENT/MOST RECENT THERAPY : Carbotaxol    Uterine cancer ([place]) (Resolved)  09/04/2019 Initial Diagnosis   Gynecologic cancer [facility])   Endometrial cancer ([place])  09/14/2019 - 06/12/2020 Chemotherapy   Patient is on  Treatment Plan : OVARIAN Carboplatin (AUC 6) / Paclitaxel (175) q21d x 6 cycles     10/05/2019 Initial Diagnosis   Endometrial cancer ([facility])    Genetic Testing   Negative germline genetic testing. No pathogenic variants identified on the [facility] panel. The report date is 10/01/2019. HRD testing was ordered but cancelled due to not enough sample/endometrial diagnosis.   The [facility] gene panel offered by [employer] includes sequencing and deletion/duplication testing of the following 35 genes: APC, ATM, AXIN2, BARD1, BMPR1A, BRCA1, BRCA2, BRIP1, CHD1, CDK4, CDKN2A, CHEK2, EPCAM (large rearrangement only), HOXB13, GALNT12, MLH1, MSH2, MSH3, MSH6, MUTYH, NBN, NTHL1, PALB2, PMS2, PTEN, RAD51C, RAD51D, RNF43, RPS20, SMAD4, STK11, and TP53. Sequencing was performed for select regions of POLE and POLD1, and large rearrangement analysis was performed for select regions of GREM1.    01/16/2020 Cancer Staging   Staging form: Corpus Uteri - Carcinoma and Carcinosarcoma, AJCC 8th Edition - Clinical: Stage IVB (cM1) - Signed by Cammie Sickle, MD on 01/16/2020   10/15/2020 -  Chemotherapy   Patient is on Treatment Plan : UTERINE Lenvatinib + Pembrolizumab q21d        HISTORY OF PRESENTING ILLNESS: Ambulating independently.  Alone. Jeanette Allen 65 y.o.  female stage IV endometrial high-grade serous carcinoma-recurrent is here to proceed with Keytruda plus Lenvima.  Patient was recently admitted to hospital twice for small bowel obstruction.  S/p conservative measures as NG tube resolved.   Currently continues to complain of mild nausea.  Difficulty sleeping at night.  No constipation.  Passing of stool noted through the ostomy.  Review of Systems  Constitutional:  Positive for malaise/fatigue. Negative for chills, diaphoresis, fever and weight loss.  HENT:  Negative for nosebleeds and sore throat.   Eyes:  Negative for double vision.  Respiratory:  Negative for  cough,  hemoptysis, sputum production, shortness of breath and wheezing.   Cardiovascular:  Negative for chest pain, palpitations, orthopnea and leg swelling.  Gastrointestinal:  Positive for nausea. Negative for blood in stool, diarrhea, heartburn, melena and vomiting.  Genitourinary:  Negative for dysuria, frequency and urgency.  Musculoskeletal:  Positive for back pain and myalgias. Negative for joint pain.  Skin: Negative.  Negative for itching and rash.  Neurological:  Negative for dizziness, tingling, focal weakness, weakness and headaches.  Endo/Heme/Allergies:  Does not bruise/bleed easily.  Psychiatric/Behavioral:  Negative for depression. The patient is not nervous/anxious and does not have insomnia.     MEDICAL HISTORY:  Past Medical History:  Diagnosis Date  . Arthritis   . Cancer ([place])   . Hypertension     SURGICAL HISTORY: Past Surgical History:  Procedure Laterality Date  . APPENDECTOMY    . COLONOSCOPY WITH PROPOFOL N/A 10/01/2019   Procedure: COLONOSCOPY WITH PROPOFOL;  Surgeon: Lin Landsman, MD;  Location[facility] END[facility]COPY;  Service: Gastroenterology;  Laterality: N/A;  . ESOPHAGOGASTRODUODENOSCOPY (EGD) WITH PROPOFOL N/A 09/12/2019   Procedure: ESOPHAGOGASTRODUODENOSCOPY (EGD) WITH PROPOFOL;  Surgeon: Lin Landsman, MD;  Location: [facility] ENDOSCOPY;  Service: Gastroenterology;  Laterality: N/A;    SOCIAL HISTORY: Social History   Socioeconomic History  . Marital status: Married    Spouse name: Not on file  . Number of children: Not on file  . Years of education: Not on file  . Highest education level: Not on file  Occupational History  . Not on file  Tobacco Use  . Smoking status: Former    Types: Cigarettes  . Smokeless tobacco: Never  Vaping Use  . Vaping Use: Never used  Substance and Sexual Activity  . Alcohol use: Not Currently  . Drug use: Never  . Sexual activity: Yes  Other Topics Concern  . Not on file  Social History Narrative   Lives with  husband; near [place]. Worked Network engineer at [employer]; smoke 1/2 ppd/slowed; no alcohol; 3 children [2 boys; one girl- [employer]]   Social Determinants of Health   Financial Resource Strain: Not on file  Food Insecurity: Not on file  Transportation Needs: Not on file  Physical Activity: Not on file  Stress: Not on file  Social Connections: Not on file  Intimate Partner Violence: Not on file    FAMILY HISTORY: Family History  Problem Relation Age of Onset  . Diabetes Maternal Grandmother   . Hypertension Maternal Grandmother   . Stroke Maternal Grandmother     ALLERGIES:  has No Known Allergies.  MEDICATIONS:  Current Outpatient Medications  Medication Sig Dispense Refill  . diphenhydramine-acetaminophen (TYLENOL PM) 25-500 MG TABS tablet Take 1 tablet by mouth at bedtime as needed.    . ergocalciferol (VITAMIN D2) 1.25 MG (50000 UT) capsule Take 50,000 Units by mouth every Monday.    Marland Kitchen LORazepam (ATIVAN) 0.5 MG tablet Take 1 tablet (0.5 mg total) by mouth every 8 (eight) hours as needed for anxiety. /nausea. 60 tablet 0  . ondansetron (ZOFRAN) 8 MG tablet One pill every 8 hours as needed for nausea/vomitting. 60 tablet 1  . prochlorperazine (COMPAZINE) 10 MG tablet Take 1 tablet (10 mg total) by mouth every 6 (six) hours as needed for nausea or vomiting. 40 tablet 1  . acetaminophen (TYLENOL) 500 MG tablet Take 1 tablet (500 mg total) by mouth every 6 (six) hours as needed for mild pain, fever or headache (or Fever >/= 101). (Patient not taking: Reported  on 10/15/2020) 30 tablet 0  . FLUoxetine (PROZAC) 40 MG capsule Take 1 capsule (40 mg total) by mouth daily. (Patient not taking: Reported on 10/15/2020) 30 capsule 3  . lenvatinib 20 mg daily dose (LENVIMA, 20 MG DAILY DOSE,) 2 x 10 MG capsule Take 2 capsules (20 mg total) by mouth daily. (Patient not taking: No sig reported) 60 capsule 1  . omeprazole (PRILOSEC) 40 MG capsule Take 1 capsule (40 mg total) by mouth 2 (two)  times daily before a meal. (Patient not taking: Reported on 10/15/2020) 60 capsule 1  . ondansetron (ZOFRAN-ODT) 4 MG disintegrating tablet Take 4 mg by mouth every 8 (eight) hours as needed for nausea or vomiting. (Patient not taking: Reported on 10/15/2020)     No current facility-administered medications for this visit.      Marland Kitchen  PHYSICAL EXAMINATION: ECOG PERFORMANCE STATUS: 1 - Symptomatic but completely ambulatory  Vitals:   10/15/20 0922  BP: 128/72  Pulse: 95  Resp: 18  Temp: (!) 97.1 F (36.2 C)   There were no vitals filed for this visit.   Physical Exam Constitutional:      Comments: Ambulating independently.  Accompanied by husband.  HENT:     Head: Normocephalic and atraumatic.     Mouth/Throat:     Pharynx: No oropharyngeal exudate.  Eyes:     Pupils: Pupils are equal, round, and reactive to light.  Cardiovascular:     Rate and Rhythm: Normal rate and regular rhythm.  Pulmonary:     Effort: Pulmonary effort is normal. No respiratory distress.     Breath sounds: Normal breath sounds. No wheezing.  Abdominal:     General: Bowel sounds are normal. There is distension.     Palpations: Abdomen is soft. There is no mass.     Tenderness: There is no abdominal tenderness. There is no guarding or rebound.  Musculoskeletal:        General: No tenderness. Normal range of motion.     Cervical back: Normal range of motion and neck supple.  Skin:    General: Skin is warm.  Neurological:     Mental Status: She is alert and oriented to person, place, and time.  Psychiatric:        Mood and Affect: Affect normal.     LABORATORY DATA:  I have reviewed the data as listed Lab Results  Component Value Date   WBC 5.0 10/14/2020   HGB 11.3 (L) 10/14/2020   HCT 31.2 (L) 10/14/2020   MCV 102.0 (H) 10/14/2020   PLT 135 (L) 10/14/2020   Recent Labs    10/11/20 1142 10/12/20 0446 10/13/20 0458 10/14/20 0438 10/15/20 0903  NA 129*   < > 135 136 135  K 3.8   < >  3.7 4.1 3.6  CL 96*   < > 105 103 100  CO2 20*   < > '25 26 25  ' GLUCOSE 127*   < > 110* 92 127*  BUN 22   < > <5* <5* 10  CREATININE 1.29*   < > 0.72 0.84 0.82  CALCIUM 8.3*   < > 6.7* 7.0* 7.6*  GFRNONAA 46*   < > >60 >60 >60  PROT 7.6  --  6.0*  --  7.3  ALBUMIN 4.1  --  3.0*  --  3.6  AST 21  --  15  --  25  ALT 21  --  14  --  17  ALKPHOS 67  --  45  --  58  BILITOT 1.1  --  0.6  --  0.4  BILIDIR 0.2  --   --   --   --   IBILI 0.9  --   --   --   --    < > = values in this interval not displayed.    RADIOGRAPHIC STUDIES: I have personally reviewed the radiological images as listed and agreed with the findings in the report. DG Chest 2 View  Result Date: 10/11/2020 CLINICAL DATA:  Shortness of breath. EXAM: CHEST - 2 VIEW COMPARISON:  None. FINDINGS: The heart size and mediastinal contours are within normal limits. Both lungs are clear. The visualized skeletal structures are unremarkable. IMPRESSION: No active cardiopulmonary disease. Electronically Signed   By: Abelardo Diesel M.D.   On: 10/11/2020 12:58   MR LUMBAR SPINE W WO CONTRAST  Result Date: 09/30/2020 CLINICAL DATA:  Low back pain for over 6 weeks. Cancer suspected (undergoing treatment for endometrial cancer. EXAM: MRI LUMBAR SPINE WITHOUT AND WITH CONTRAST TECHNIQUE: Multiplanar and multiecho pulse sequences of the lumbar spine were obtained without and with intravenous contrast. CONTRAST:  71m GADAVIST GADOBUTROL 1 MMOL/ML IV SOLN COMPARISON:  Five lumbar type vertebrae FINDINGS: Segmentation:  Standard. Alignment:  Mild retrolisthesis at L3-4.  Mild scoliosis Vertebrae: Benign heterogeneity of marrow. No evidence of fracture or aggressive bone lesion Conus medullaris and cauda equina: Conus extends to the L1-2 level. Conus and cauda equina appear normal. Paraspinal and other soft tissues: No evidence of perispinal mass or inflammation Disc levels: T12- L1: Unremarkable. L1-L2: Unremarkable. L2-L3: Mild disc narrowing and  bulging L3-L4: Disc narrowing and bulging with right foraminal annular fissure. Mild retrolisthesis. Patent canal and foramina L4-L5: Disc narrowing and bulging.  Negative facets L5-S1:Degenerative facet spurring which is mild. Asymmetric left foraminal impingement from disc protrusion and disc narrowing best seen on sagittal images. IMPRESSION: 1. No acute finding or evidence of metastatic disease. 2. Lumbar spine degeneration with mild scoliosis and L3-4 retrolisthesis. No impingement to explain right leg symptoms. Focal stenosis at the left L5-S1 foramen. Electronically Signed   By: JJorje GuildM.D.   On: 09/30/2020 05:49   CT ABDOMEN PELVIS W CONTRAST  Result Date: 10/11/2020 CLINICAL DATA:  Abdominal pain. EXAM: CT ABDOMEN AND PELVIS WITH CONTRAST TECHNIQUE: Multidetector CT imaging of the abdomen and pelvis was performed using the standard protocol following bolus administration of intravenous contrast. CONTRAST:  1067mOMNIPAQUE IOHEXOL 350 MG/ML SOLN COMPARISON:  October 06, 2020 FINDINGS: Lower chest: Scattered areas of ground-glass consolidation in the right lung base. Hepatobiliary: Normal attenuation of the liver. Multiple too small to be actually characterize by CT hypoattenuated circumscribed nodules throughout the liver. Gallbladder is grossly normal. Pancreas: Unremarkable. No pancreatic ductal dilatation or surrounding inflammatory changes. Spleen: Normal in size without focal abnormality. Adrenals/Urinary Tract: Adrenal glands are unremarkable. Kidneys are normal, without renal calculi, focal lesion, or hydronephrosis. Bladder is unremarkable. Stomach/Bowel: Normal appearance of the stomach. Diffuse dilation of small bowel loops measuring up to 4.1 cm. Gradual transition to normal caliber and collapsed state of the distal small bowel. Loop ileostomy is unchanged and is not the source of obstruction. The distal ileal loops downstream to the loop ileostomy are collapsed. Prior left-sided  colonic resection with Hartmann's pouch in the left mid upper abdomen. Decompressed residual colon. Vascular/Lymphatic: Aortic atherosclerosis. No enlarged abdominal or pelvic lymph nodes. Reproductive: Status post hysterectomy. No adnexal masses. Other: Increased free fluid in the abdomen and pelvis. Musculoskeletal: Lumbosacral  spine spondylosis. IMPRESSION: 1. Postsurgical changes of left-sided colostomy and right-sided loop ileostomy. Diffuse dilation of small bowel loops measuring up to 4.1 cm with gradual transition to normal caliber and collapsed state of the distal small bowel. The distal ileal loops downstream to the loop ileostomy are collapsed. Findings are consistent with small-bowel obstruction, likely due to adhesions. 2. Increased free fluid in the abdomen and pelvis. 3. Multiple too small to be actually characterize by CT hypoattenuated circumscribed nodules throughout the liver. 4. Scattered areas of ground-glass consolidation in the right lung base, which may represent infectious or inflammatory changes. Aortic Atherosclerosis (ICD10-I70.0). Electronically Signed   By: Fidela Salisbury M.D.   On: 10/11/2020 12:36   CT ABDOMEN PELVIS W CONTRAST  Result Date: 10/06/2020 CLINICAL DATA:  Abdominal pain and findings suggestive of small-bowel obstruction on recent plain film examination. EXAM: CT ABDOMEN AND PELVIS WITH CONTRAST TECHNIQUE: Multidetector CT imaging of the abdomen and pelvis was performed using the standard protocol following bolus administration of intravenous contrast. CONTRAST:  22m OMNIPAQUE IOHEXOL 350 MG/ML SOLN COMPARISON:  Plain film from earlier in the same day, PET-CT from 08/25/2020 FINDINGS: Lower chest: No acute abnormality. Hepatobiliary: Tiny dependent gallstone is noted. No wall thickening is seen. Liver is unremarkable. Pancreas: Unremarkable. No pancreatic ductal dilatation or surrounding inflammatory changes. Spleen: Normal in size without focal abnormality.  Adrenals/Urinary Tract: Adrenal glands are within normal limits. Kidneys demonstrate a normal enhancement pattern bilaterally. Normal excretion of contrast is noted on delayed images. The bladder is partially distended. 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 More proximal colon appears within normal limits. The appendix has been surgically removed. Stomach is within normal limits. Proximal jejunum appears unremarkable. Dilated loops of distal jejunum and proximal ileum are noted with air and fluid identified. No discrete transition zone is identified. There is a more gradual tapering from dilated to nondilated small bowel. This is well before the loop ileostomy. The distal ileal loops beyond the loop ileostomy are within normal limits. These changes may be related to adhesions from prior surgery. No discrete mass lesion is noted. Vascular/Lymphatic: Aortic atherosclerosis. No enlarged abdominal or pelvic lymph nodes. Reproductive: Status post hysterectomy. No adnexal masses. Other: Mild free fluid is noted within the abdomen. No discrete peritoneal deposits are seen. Musculoskeletal: Mild degenerative changes of lumbar spine are noted. IMPRESSION: Changes consistent with prior hysterectomy as well as left-sided colostomy and right-sided loop ileostomy. Some dilated fluid and air-filled loops of small bowel are noted without discrete transition zone. These changes are likely related to adhesions with mild partial small bowel obstruction. Continued follow-up is recommended. Cholelithiasis without complicating factors. Slight increase in free fluid when compared with the prior PET-CT. No discrete peritoneal implants are noted. Electronically Signed   By: MInez CatalinaM.D.   On: 10/06/2020 21:33   DG Abd 2 Views  Result Date: 10/06/2020 CLINICAL DATA:  Abdominal pain, history of endometrial cancer EXAM: ABDOMEN - 2 VIEW COMPARISON:  CT chest abdomen pelvis 07/08/2020 FINDINGS:  Dilated small bowel loops in the mid abdomen. Paucity of colonic gas. Findings consistent with small bowel obstruction. Ostomy RIGHT lower quadrant. No bowel wall thickening or free air. Scattered pelvic phleboliths without urinary tract calcification. Bones demineralized. IMPRESSION: Dilated small bowel loops and paucity of colonic gas consistent with small bowel obstruction. These results will be called to the ordering clinician or representative by the Radiologist Assistant, and communication documented in the PACS or [employer] Electronically Signed   By: MElta Guadeloupe  Thornton Papas M.D.   On: 10/06/2020 12:32   DG Abd Portable 1V  Result Date: 10/13/2020 CLINICAL DATA:  Small-bowel obstruction EXAM: PORTABLE ABDOMEN - 1 VIEW COMPARISON:  10/12/2020 FINDINGS: Enteric tube is no longer present. Decreased small bowel distension. Right lower quadrant ostomy. IMPRESSION: Decreased small bowel distension. Enteric tube is no longer present. Electronically Signed   By: Macy Mis M.D.   On: 10/13/2020 08:38   DG Abd Portable 1V  Result Date: 10/12/2020 CLINICAL DATA:  65 year old female with history of small-bowel obstruction. EXAM: PORTABLE ABDOMEN - 1 VIEW COMPARISON:  Abdominal radiograph 10/11/2020. FINDINGS: Nasogastric tube has been advanced, now with tip in the distal body of the stomach. Stomach is decompressed. Several loops of dilated small bowel are again noted in the central abdomen, largest of which measures up to 4.6 cm in diameter. There is a paucity of colonic gas and stool noted. No definite pneumoperitoneum noted on these supine images. Iodinated contrast material is evident in the urinary bladder. Right lower quadrant colostomy. IMPRESSION: 1. Bowel gas pattern remains compatible with small bowel obstruction, as above. 2. No pneumoperitoneum. 3. Tip of nasogastric tube is in the distal body of the stomach. Electronically Signed   By: Vinnie Langton M.D.   On: 10/12/2020 05:16   DG Abd  Portable 1V  Result Date: 10/11/2020 CLINICAL DATA:  Enteric catheter placement, ileus, vomiting EXAM: PORTABLE ABDOMEN - 1 VIEW COMPARISON:  10/09/2020 FINDINGS: Supine frontal view of the abdomen and pelvis demonstrates enteric catheter tip passing below diaphragm overlying the gastric fundus. Side port projects just above the gastroesophageal junction, recommend advancing at least 3-4 cm to ensure placement within the gastric lumen. Continued gaseous distention of the small bowel measuring up to 4 cm consistent with small-bowel obstruction. Ostomy right lower quadrant. Excreted contrast identified within the kidneys, ureters, and bladder from preceding CT. IMPRESSION: 1. Enteric catheter tip projecting over the gastric fundus. Recommend advancing catheter 3-4 cm to ensure side port placement within the gastric lumen. 2. Continued small bowel obstruction. Electronically Signed   By: Randa Ngo M.D.   On: 10/11/2020 16:24   DG Abd Portable 1V  Result Date: 10/09/2020 CLINICAL DATA:  Small bowel obstruction EXAM: PORTABLE ABDOMEN - 1 VIEW COMPARISON:  Radiograph dated October 07, 2020. FINDINGS: Numerous dilated gas-filled loops of small bowel are seen throughout the abdomen, degree of distention is similar to prior exam. Enteric contrast material is no longer seen. No evidence of free air, although supine positioning limits evaluation. No acute osseous abnormality. IMPRESSION: Numerous dilated gas-filled loops of small bowel are seen throughout the abdomen, degree of distention is similar to prior exam. Enteric contrast material is no longer seen. Electronically Signed   By: Yetta Glassman M.D.   On: 10/09/2020 10:18   DG Abd Portable 1V-Small Bowel Obstruction Protocol-initial, 8 hr delay  Result Date: 10/07/2020 CLINICAL DATA:  65 year old female with possible small-bowel obstruction on CT 2 days ago. EXAM: PORTABLE ABDOMEN - 1 VIEW COMPARISON:  CT Abdomen and Pelvis 10/06/2020 and earlier.  FINDINGS: Right lower quadrant ileostomy on the comparison CT. Portable AP supine view at 0902 hours. Oral contrast appears to be present throughout the small bowel now to the level of the ileostomy. Right lower quadrant loops remain relatively decompressed as before. Excreted IV contrast also in the urinary bladder. Incidental pelvic phleboliths. There is some residual oral contrast within the stomach. Stable visualized osseous structures. Right lung base appears negative. IMPRESSION: Evidence that oral contrast has reached the  right lower quadrant ileostomy on this image suggesting an incomplete or resolving bowel obstruction. Electronically Signed   By: Genevie Ann M.D.   On: 10/07/2020 09:33    ASSESSMENT & PLAN:   Endometrial cancer ([place]) # STAGE IV-High-grade serous carcinoma endometrial] pre-treatment-CEA 125 +1500   #10  CarboTaxol.-Currently on surveillance.  August 2022-PET scan no obvious evidence of any progression noted; minimal residual nodularity in the left upper quadrant without any clear metabolic uptake.  No new peritoneal disease noted.  However, Ca125-slightly elevated at 80.  However concern for clinical progression-given the recent admission to hospital for small bowel obstruction-see below  #Proceed with [facility] today cycle #1.  Labs adequate.  TSH normal.  Also start lenvatinib-however start with a lower dose 10 mg a day [given patient's nausea; recent admission to hospital for small bowel obstruction].  If tolerating well would increase the dose to 20 mg in the next 1 to 2 weeks.   I discussed the mechanism of action; The goal of therapy is palliative; and length of treatments are likely ongoing/based upon the results of the scans. Discussed the potential side effects of immunotherapy including but not limited to diarrhea; skin rash; elevated LFTs/endocrine abnormalities etc. EKG no evidence of any QT prolongation.  #Nausea/difficulty sleeping/anxiety-recommend Ativan.  Prescription  sent.  #Recent small bowel obstruction-tends to admission to hospital-s/p conservative measures.  Monitor closely.  # fatgiue- depression-STABLE; on prozac to 40 mg/day   # DISPOSITION:  #  Follow up in 1 week- MD; labs- cbc/cmp; possible IV fluids.-Dr.B     All questions were answered. The patient knows to call the clinic with any problems, questions or concerns.   Cammie Sickle, MD 10/15/2020 10:06 PM

## 2020-10-16 ENCOUNTER — Telehealth: Payer: Self-pay | Admitting: *Deleted

## 2020-10-16 LAB — CA 125: Cancer Antigen (CA) 125: 228 U/mL — ABNORMAL HIGH (ref 0.0–38.1)

## 2020-10-16 NOTE — Telephone Encounter (Signed)
I spoke with Mr Nokes who said that he just got off phone with anther nurse and nurse and he is planing to take her to the ER

## 2020-10-16 NOTE — Telephone Encounter (Signed)
Spoke with patient's husband.  Advise husband to have patient to the ER asap to r/o a SBO. Husband agreeable.

## 2020-10-16 NOTE — Telephone Encounter (Signed)
Incoming call message from patient husband reporting that patient is having same symptoms as other times that she was admitted to hospital. I attempted to return call to number he left, but got voice mail message and I left message to return my call so that I can get more information. Of note, patient was seen in office yesterday and had treatment.

## 2020-10-16 NOTE — Telephone Encounter (Signed)
I got in touch with patient husband, She vomiting 4 times this morning and the medicine is not working. The emesis is yellow and pure liquid. Unable to eat or drink this morning. She does not have diarrhea. She is taking ice chips. Please advise

## 2020-10-17 NOTE — Telephone Encounter (Signed)
Oral Oncology Patient Advocate Encounter  Received notification from [facility] Patient Assistance program that patient has been successfully enrolled into their program to receive Lenvima from the manufacturer at $0 out of pocket until 01/03/21.    I will reach out to the with patient to let her know of the approval. EISAI did try to call her to let her know but her mailbox was full.   Specialty Pharmacy that will dispense medication is [facility].  Patient knows to call the office with questions or concerns.   Oral Oncology Clinic will continue to follow.  [place] [facility] [place] Phone [phone] Fax [phone] 10/17/2020 2:08 PM

## 2020-10-22 ENCOUNTER — Inpatient Hospital Stay: Payer: Medicare Other

## 2020-10-22 ENCOUNTER — Inpatient Hospital Stay: Payer: Medicare Other | Admitting: Internal Medicine

## 2020-10-22 DIAGNOSIS — G893 Neoplasm related pain (acute) (chronic): Secondary | ICD-10-CM | POA: Insufficient documentation

## 2020-10-29 ENCOUNTER — Other Ambulatory Visit: Payer: Self-pay

## 2020-10-29 ENCOUNTER — Inpatient Hospital Stay (HOSPITAL_BASED_OUTPATIENT_CLINIC_OR_DEPARTMENT_OTHER): Payer: Medicare Other | Admitting: Obstetrics and Gynecology

## 2020-10-29 ENCOUNTER — Inpatient Hospital Stay: Payer: Medicare Other

## 2020-10-29 VITALS — BP 112/91 | HR 145 | Temp 97.8°F | Resp 16 | Wt 139.6 lb

## 2020-10-29 DIAGNOSIS — C541 Malignant neoplasm of endometrium: Secondary | ICD-10-CM | POA: Diagnosis not present

## 2020-10-29 DIAGNOSIS — Z5112 Encounter for antineoplastic immunotherapy: Secondary | ICD-10-CM | POA: Diagnosis not present

## 2020-10-29 LAB — COMPREHENSIVE METABOLIC PANEL
ALT: 18 U/L (ref 0–44)
AST: 22 U/L (ref 15–41)
Albumin: 3.9 g/dL (ref 3.5–5.0)
Alkaline Phosphatase: 66 U/L (ref 38–126)
Anion gap: 11 (ref 5–15)
BUN: 40 mg/dL — ABNORMAL HIGH (ref 8–23)
CO2: 24 mmol/L (ref 22–32)
Calcium: 9.9 mg/dL (ref 8.9–10.3)
Chloride: 97 mmol/L — ABNORMAL LOW (ref 98–111)
Creatinine, Ser: 1.72 mg/dL — ABNORMAL HIGH (ref 0.44–1.00)
GFR, Estimated: 32 mL/min — ABNORMAL LOW (ref >60)
Glucose, Bld: 116 mg/dL — ABNORMAL HIGH (ref 70–99)
Potassium: 3.6 mmol/L (ref 3.5–5.1)
Sodium: 132 mmol/L — ABNORMAL LOW (ref 135–145)
Total Bilirubin: 0.8 mg/dL (ref 0.3–1.2)
Total Protein: 8.1 g/dL (ref 6.5–8.1)

## 2020-10-29 LAB — CBC WITH DIFFERENTIAL/PLATELET
Abs Immature Granulocytes: 0.04 10*3/uL (ref 0.00–0.07)
Basophils Absolute: 0 10*3/uL (ref 0.0–0.1)
Basophils Relative: 1 %
Eosinophils Absolute: 0.1 10*3/uL (ref 0.0–0.5)
Eosinophils Relative: 2 %
HCT: 40.2 % (ref 36.0–46.0)
Hemoglobin: 13.8 g/dL (ref 12.0–15.0)
Immature Granulocytes: 1 %
Lymphocytes Relative: 20 %
Lymphs Abs: 1.3 10*3/uL (ref 0.7–4.0)
MCH: 34.8 pg — ABNORMAL HIGH (ref 26.0–34.0)
MCHC: 34.3 g/dL (ref 30.0–36.0)
MCV: 101.5 fL — ABNORMAL HIGH (ref 80.0–100.0)
Monocytes Absolute: 0.9 10*3/uL (ref 0.1–1.0)
Monocytes Relative: 15 %
Neutro Abs: 4 10*3/uL (ref 1.7–7.7)
Neutrophils Relative %: 61 %
Platelets: 263 10*3/uL (ref 150–400)
RBC: 3.96 MIL/uL (ref 3.87–5.11)
RDW: 13 % (ref 11.5–15.5)
WBC: 6.4 10*3/uL (ref 4.0–10.5)
nRBC: 0 % (ref 0.0–0.2)

## 2020-10-29 LAB — MAGNESIUM: Magnesium: 1.4 mg/dL — ABNORMAL LOW (ref 1.7–2.4)

## 2020-10-29 NOTE — Progress Notes (Signed)
Gynecologic Oncology Interval Visit   Referring Provider: Dr. 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 G4P3 female (h/o ruptured ectopic s/p XL) who is seen in consultation from Dr. Rogue Bussing and Bary Castilla for elevated high grade serous cancer s/p 7 cycles carboplatin-Taxol chemotherapy followed by TLH-BSO on 03/27/20 who returns to clinic for re-evaluation. She has a medical history significant for Hepatitis C (ectopic pregnancy in 1990 requiring transfusion).    10/13-19/22 admitted to [place] She presented to the ED at [facility] with pain, nausea, and emesis. She has had several recent admissions over the past several months for small bowel obstruction, in the setting of recent initiation of pembro/lenvatinib. General surgery was consulted in the Ed did not believe she was a surgical candidate due to extensive prior surgeries and no need for acute surgical intervention. An NG tube was placed and patient made NPO. After discussion, the decision was made to proceed with a venting g-tube for symptomatic management while continuing chemotherapy. A g-tube was placed by IR on 10/17 which the patient tolerated well, and after her diet was advanced without complications and teaching completed she was discharged home.   Since then continues to eat small amounts with some vomiting. Does vent with the G tube when vomiting.   CT 07/09/2020 IMPRESSION: Status post hysterectomy, bilateral salpingo oophorectomy, and omentectomy. Status post left hemicolectomy.  Mild peritoneal disease in the left upper abdomen, difficult to distinguish from adjacent loops of colon, but grossly unchanged. No abdominopelvic ascites.  No evidence of metastatic disease in the abdomen/pelvis.  Result Notes  Component Ref Range & Units 4 wk ago  (06/11/20) 1 mo ago  (05/28/20) 2 mo ago  (05/07/20) 2 mo ago  (04/16/20) 4 mo ago  (02/19/20) 5 mo ago  (02/05/20) 5 mo ago   (01/16/20)  Cancer Antigen (CA) 125 0.0 - 38.1 U/mL 42.5 High   43.3 High  CM  59.0 High  CM  111.0 High  CM  54.6 High  CM  43.8 High  CM  72.3 High  CM       She presents for a pelvic exam today. She will be seeing Dr. Patrecia Pace on 07/11/2020.   Gynecologic Oncology History Jeanette Allen is a pleasant female who is seen in consultation from Dr. Charlynn Grimes and Bary Castilla for elevated CA125 and ascites. She has a medical history significant for Hepatitis C (ectopic pregnancy in 1990 requiring transfusion).  Her history is as follows:  She presented with abdominal pain for 2-3 weeks, daily nausea (episode of emesis of CT contrast), early satiety and abdominal bloating.    08/20/2019 US Abdomen  Cirrhotic liver with small amount of perihepatic ascites. Multiple peripancreatic and perihepatic hypoechoic nodules, the largest of which measures 2.0 cm at the inferior liver edge. Recommend CT for further evaluation.   08/22/2019 The patient underwent a contrast-enhanced CT. This described possibility of gas around the gallbladder. Cholecystitis is a consideration.  Extensive abdominal and pelvic ascites. No retroperitoneal masses. No evidence of an acute inflammatory process. Suspected uterine fibroid. No free air.   08/23/2019 She saw Dr. Bary Castilla for evaluation given gallbladder findings.   Labs: elevated hepatitis C virus antibody. Liver function with elevated transaminases. Normal total bilirubin, albumin and electrolytes. Normal renal function. CBC showed a hemoglobin of 15.6 with an MCV of 95, white blood cell count of 6500 with normal differential. Hemoglobin A1c 5.5.  CA125= 1228; CEA= 2.1; alpha-fetoprotein = 2,2, and  CA19-9 <2.  08/27/2019 Paracentesis total of approximately 1250 mL of clear, amber colored fluid was Removed. LDH 334; ANC42; total nucleated cell 3,035; No WBC; No organisms; No growth; Albumin 2.8; total protein 5.0; Glucose 98. Cytology- POSITIVE FOR MALIGNANCY. - COMPATIBLE WITH  HIGH-GRADE SEROUS CARCINOMA.   Attempted endometrial biopsy but procedure aborted due to cervical stenosis.   09/07/2019 biopsy of peritoneal mass DIAGNOSIS:  A. PERITONEAL MASS, LEFT LATERAL; CT-GUIDED CORE BIOPSY:  - METASTATIC HIGH-GRADE SEROUS CARCINOMA.   09/12/2019 DUODENAL BULB; COLD BIOPSY DIAGNOSIS:  A. DUODENAL BULB; COLD BIOPSY:  - DUODENAL MUCOSA DISPLAYING NORMAL VILLOUS ARCHITECTURE AND MILD  BRUNNER GLAND HYPERPLASIA.  - NEGATIVE FOR FEATURES OF CELIAC, DYSPLASIA, AND MALIGNANCY.   PET 09/13/2019 IMPRESSION: 1. Diffuse and extensive peritoneal carcinomatosis as detailed above. The uterus and the sigmoid colon are abnormal and the primary neoplasm could be the sigmoid colon or possibly endometrial carcinoma. Given their close proximity it is also possible that this is sigmoid colon cancer invading the uterus or less likely vice versa. Recommend omental biopsy or sigmoidoscopy. 2. Single left upper lobe pulmonary nodule is mildly hypermetabolic and could reflect metastatic disease.  Given extensive nature of disease recommendation was for NACT.   09/14/19- Cycle 1  arboplatin/Taxol CA125 1,501 10/05/19- Cycle 2  CA125 1,196  10/11/19- US Venous Bilateral for leg pain post chemotherapy was negative for DVT  10/26/19- Cycle 3 CA125 614  11/06/2019- CT for restaging- Decreased ascites and decreased size of bulky omental disease and areas of peritoneal disease. Omental disease remains bulky as described. Mild to moderate improvement in upper abdominal adenopathy. Particularly with respect to gastrohepatic implants or lymph nodes that were seen previously. Resolution of LEFT- sided pleural fluid. Subjective decrease in mesenteric thickening. New signs of disease though smaller areas would be difficult to detect given the extensive nature of preexistent disease. No evidence of metastatic disease to the chest. Aortic atherosclerosis. Mild distension of distal small bowel in the setting of  peritoneal disease with mild angulation of some small bowel loops.   11/16/19- Cycle 4 CA125 260  On 12/13/2019 she underwent EUA, diagnostic laparoscopy and D&C.   Upon diagnostic laparoscopy the Fagotti score was calculated based on: Tumor diffusion along the omentum up to the large stomach curvature score 2 Massive peritoneal involvement and/or miliary pattern of distribution of peritoneal carcinomatosis score 2 Wide spread infiltrating carcinomatosis and/or confluent nodules to the majority of the diaphragmatic surface absent, score 0 Large infiltrating nodules and/or an involvement of the root of the mesentery supposed on the basis of limited movements of the various intestinal segments 2 Possible large/small bowel resection (excluding recto-sigmoid resection) and/or extended carcinomatosis on the ansae score 2 Obvious neoplastic involvement of the gastric wall score 2 Liver surface lesions larger than 2 cm score 0   Total score: 10  A.  Endometrium, curettage:   Scant atypical cells present suspicious but not diagnostic of malignancy, A1-4    WT-1   Negative A1-5    P16     Patchy positive A1-6    P53     Positive/abnormal in atypical cells   These findings are suspicious but not diagnostic of malignancy.  Evaluation is limited by scant cellularity.    We recommended additional chemotherapy.  12/26/2019 Cycle #5 CA125 99.3 01/29/2020 Cycle #6 Dose reduction carboplatin AUC- 5 secondary thrombocytopenia at cycle #6.  CA125 72.3 02/05/20 CA125 43.8 02/19/2020 she underwent cycle #7 of chemotherapy. AST/ALT-G-1-2; secondary to Taxol. CA125 on 02/19/20 54.6     02/29/2020 PET scan CHEST: Previous described LEFT upper lobe nodule is no longer identified. Hypermetabolic mediastinal lymph nodes.   ABDOMEN/PELVIS: Dramatic reduction in multifocal hypermetabolic peritoneal carcinomatosis with near complete resolution of the previously intensely radiotracer avid and extensive peritoneal  carcinomatosis.   Two foci of hypermetabolic of peritoneal tissue remain. LEFT upper quadrant tubular thickening measuring 3.2 by 1.2 cm with SUV max equal 4.5. This is reduced significantly in size from bulky peritoneal mass with SUV max equal 9.6 on prior.   Second focus of radiotracer activity along the inferior margin of the LEFT hepatic lobe just beneath the falciform ligament with SUV max equal 5.1. This activity is more difficult to place and presumed along the surface of the liver but cannot fully exclude physiologic activity in adjacent bowel.   Resolution of the previously seen bulky hypermetabolic tissue associated with the sigmoid colon. Near complete resolution of the metabolic activity associated with the uterus. Mild activity remain the uterus which is nonspecific (SUV max equal 3.8 on image 75).   IMPRESSION: 1. Dramatic positive response to therapy with near complete resolution of the hypermetabolic peritoneal metastasis. 2. Tubular focus of hypermetabolic peritoneal thickening in the LEFT upper quadrant does remain and has moderate metabolic activity decreased from bulky intense radiotracer activity on prior. 3. A second focus of metabolic activity along the inferior margin LEFT hepatic lobe may also represent mild residual disease. 4. Resolution of metabolic activity within the uterus and sigmoid colon. Mild residual activity may be physiologic. 5. Resolution of intraperitoneal free fluid. 6. Resolution of LEFT upper lobe pulmonary nodule.  03/27/20 - TAH- BSO, omentectomy, sigmoid bowel resection with loop ileostomy and colonic mucous fistula by Dr. Secord. Pathology: Endometrial adenocarcinoma (3.0 cm), serous type. Tumor invades the full thickness of the myometrium to involve the uterine serosa (1.9 of 1.9 cm). Right ovary: Metastatic adenocarcinoma, involving the ovarian surface and outer parenchyma. Left ovary: Metastatic adenocarcinoma, involving the ovarian surface and  outer parenchyma.  04/16/20 She resumed chemotherapy with paclitaxel and carboplatin, cycle #8. Received 10 cycles - last treatment 06/11/2020. Carboplatin AUC 5; Taxol decrease dose to 80%.   Genetic testing - [facility] was negative.   Negative germline genetic testing. No pathogenic variants identified on the [facility] panel. The report date is 10/01/2019. HRD testing ([facility]) still pending.    The MyRisk gene panel offered by [facility] includes sequencing and deletion/duplication testing of the following 35 genes: APC, ATM, AXIN2, BARD1, BMPR1A, BRCA1, BRCA2, BRIP1, CHD1, CDK4, CDKN2A, CHEK2, EPCAM (large rearrangement only), HOXB13, GALNT12, MLH1, MSH2, MSH3, MSH6, MUTYH, NBN, NTHL1, PALB2, PMS2, PTEN, RAD51C, RAD51D, RNF43, RPS20, SMAD4, STK11, and TP53. Sequencing was performed for select regions of POLE and POLD1, and large rearrangement analysis was performed for select regions of GREM1.     Tumor testing -  ATM and T53 alterations; ESRI-CCDC170 fusion Omniseq. Insufficient tissue from biopsy for HRD testing. Opted for Omniseq upfront. Post surgery, pathology consistent with endometrial cancer. Negative for HER-2 and MSS           % of Cells Staining Intensity Score Interpretation  HER2/neu IHC 11 2+ EQUIVOCAL      HER2/Neu Gene Copy Number Centromere 17 (CEP17) Gene Copy Number HER2/Neu to CEP17 Ratio Interpretation  HER2/Neu FISH  2.7 1.6 1.94 NOT AMPLIFIED      COVID vaccination 05/24/2019 and 06/21/2019  Problem List: Patient Active Problem List   Diagnosis Date Noted   COVID-19 virus infection 10/07/2020   Genetic testing 10/11/2019     Endometrial cancer (HCC) 10/05/2019   Abnormal PET scan of colon    Serous carcinoma of female pelvis (HCC) 09/26/2019   Goals of care, counseling/discussion 09/04/2019   Hepatitis C infection 08/29/2019   CAD (coronary artery disease) 08/29/2019   Depression 08/29/2019   Esophageal reflux 08/29/2019    Hyperlipidemia 08/29/2019   Hypertension 08/29/2019   Nonrheumatic mitral valve disorder 08/29/2019   Vitamin B12 deficiency 08/29/2019   Vitamin D deficiency 08/29/2019   Tobacco abuse 08/29/2019   Acute hepatitis C 08/13/2019   DDD (degenerative disc disease), lumbar 08/02/2018   Elevation of level of transaminase and lactic acid dehydrogenase (LDH) 02/18/2017    Past Medical History: Past Medical History:  Diagnosis Date   Arthritis    Cancer (HCC)    Hypertension    Past Surgical History: Past Surgical History:  Procedure Laterality Date   APPENDECTOMY     COLONOSCOPY WITH PROPOFOL N/A 10/01/2019   Procedure: COLONOSCOPY WITH PROPOFOL;  Surgeon: Vanga, Rohini Reddy, MD;  Location: [facility] END[facility]COPY;  Service: Gastroenterology;  Laterality: N/A;   ESOPHAGOGASTRODUODENOSCOPY (EGD) WITH PROPOFOL N/A 09/12/2019   Procedure: ESOPHAGOGASTRODUODENOSCOPY (EGD) WITH PROPOFOL;  Surgeon: Vanga, Rohini Reddy, MD;  Location: [facility] ENDOSCOPY;  Service: Gastroenterology;  Laterality: N/A;  colonoscopy 2011 Tubal pregnancy 1990 Tubal reversal  Past Gynecologic History:  Menarche: age 11 Last menstrual period: age 49  OB History:  OB History  Gravida Para Term Preterm AB Living  4 3     1    SAB IAB Ectopic Multiple Live Births      1        # Outcome Date GA Lbr Len/2nd Weight Sex Delivery Anes PTL Lv  4 Ectopic           3 Para           2 Para           1 Para             Family History: Family History  Problem Relation Age of Onset   Diabetes Maternal Grandmother    Hypertension Maternal Grandmother    Stroke Maternal Grandmother    Immunization History  Administered Date(s) Administered   Fluad Quad(high Dose 65+) 10/26/2019   Moderna Sars-Covid-2 Vaccination 05/24/2019, 06/21/2019   Social History: Social History   Socioeconomic History   Marital status: Married    Spouse name: Not on file   Number of children: Not on file   Years of education: Not on file    Highest education level: Not on file  Occupational History   Not on file  Tobacco Use   Smoking status: Former    Types: Cigarettes   Smokeless tobacco: Never  Vaping Use   Vaping Use: Never used  Substance and Sexual Activity   Alcohol use: Not Currently   Drug use: Never   Sexual activity: Yes  Other Topics Concern   Not on file  Social History Narrative   Lives with husband; near [place]. 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; smoke 1/2 ppd/slowed; no alcohol; 3 children [2 boys; one girl- [employer]]   Social Determinants of Health   Financial Resource Strain: Not on file  Food Insecurity: Not on file  Transportation Needs: Not on file  Physical Activity: Not on file  Stress: Not on file  Social Connections: Not on file  Intimate Partner Violence: Not on file   Immunization History  Administered Date(s) Administered   Fluad Quad(high Dose 65+) 10/26/2019   Moderna Sars-Covid-2   Vaccination 05/24/2019, 06/21/2019   Allergies: No Known Allergies  Current Medications: Current Outpatient Medications  Medication Sig Dispense Refill   acetaminophen (TYLENOL) 500 MG tablet Take 1 tablet (500 mg total) by mouth every 6 (six) hours as needed for mild pain, fever or headache (or Fever >/= 101). (Patient not taking: Reported on 10/15/2020) 30 tablet 0   diphenhydramine-acetaminophen (TYLENOL PM) 25-500 MG TABS tablet Take 1 tablet by mouth at bedtime as needed.     ergocalciferol (VITAMIN D2) 1.25 MG (50000 UT) capsule Take 50,000 Units by mouth every Monday.     FLUoxetine (PROZAC) 40 MG capsule Take 1 capsule (40 mg total) by mouth daily. (Patient not taking: Reported on 10/15/2020) 30 capsule 3   lenvatinib 20 mg daily dose (LENVIMA, 20 MG DAILY DOSE,) 2 x 10 MG capsule Take 2 capsules (20 mg total) by mouth daily. (Patient not taking: No sig reported) 60 capsule 1   LORazepam (ATIVAN) 0.5 MG tablet Take 1 tablet (0.5 mg total) by mouth every 8 (eight) hours as needed for  anxiety. /nausea. 60 tablet 0   omeprazole (PRILOSEC) 40 MG capsule Take 1 capsule (40 mg total) by mouth 2 (two) times daily before a meal. (Patient not taking: Reported on 10/15/2020) 60 capsule 1   ondansetron (ZOFRAN) 8 MG tablet One pill every 8 hours as needed for nausea/vomitting. 60 tablet 1   ondansetron (ZOFRAN-ODT) 4 MG disintegrating tablet Take 4 mg by mouth every 8 (eight) hours as needed for nausea or vomiting. (Patient not taking: Reported on 10/15/2020)     prochlorperazine (COMPAZINE) 10 MG tablet Take 1 tablet (10 mg total) by mouth every 6 (six) hours as needed for nausea or vomiting. 40 tablet 1   No current facility-administered medications for this visit.   Review of Systems General: fatigue  HEENT: no complaints  Lungs: no complaints  Cardiac: no complaints  GI: difficulty eating with partial SBO  GU: no complaints  Musculoskeletal: back pain  Extremities: no complaints  Skin: no complaints  Neuro: no complaints  Endocrine: no complaints  Psych: no complaints      Objective:  Physical Examination:  BP (!) 112/91   Pulse (!) 145   Temp 97.8 F (36.6 C)   Resp 16   Wt 139 lb 9.6 oz (63.3 kg)   SpO2 95%   BMI 23.96 kg/m    ECOG Performance Status: 1 - Symptomatic but completely ambulatory  GENERAL: Patient is a well appearing female in no acute distress HEENT:  atraumatic and normocephalic ABDOMEN:  Soft, nontender, nondistended. No obvious masses or ascites. Ostomy in the RLQ red and healthy appearing, slightly prolapsed but easily reducible. Mucous fistula covered with bandage.   MSK:  No focal spinal tenderness to palpation. Full range of motion bilaterally in the upper extremities. EXTREMITIES:  No peripheral edema.   SKIN:  Clear with no obvious rashes or skin changes. No nail dyscrasia. Incisions intact and no evidence of hernia.  NEURO:  Nonfocal. Well oriented.  Appropriate affect.  Pelvic: EGBUS: no lesions Cervix: surgically  absent Vagina: no lesions, no discharge or bleeding Uterus: surgically absent BME: no palpable masses  Lab Review CBC Latest Ref Rng & Units 10/14/2020 10/13/2020 10/12/2020  WBC 4.0 - 10.5 K/uL 5.0 5.5 7.2  Hemoglobin 12.0 - 15.0 g/dL 11.3(L) 11.4(L) 11.9(L)  Hematocrit 36.0 - 46.0 % 31.2(L) 33.0(L) 34.6(L)  Platelets 150 - 400 K/uL 135(L) 147(L) 163  ANC 800 (11/07/19)    Chemistry        Component Value Date/Time   NA 135 10/15/2020 0903   K 3.6 10/15/2020 0903   CL 100 10/15/2020 0903   CO2 25 10/15/2020 0903   BUN 10 10/15/2020 0903   CREATININE 0.82 10/15/2020 0903      Component Value Date/Time   CALCIUM 7.6 (L) 10/15/2020 0903   ALKPHOS 58 10/15/2020 0903   AST 25 10/15/2020 0903   ALT 17 10/15/2020 0903   BILITOT 0.4 10/15/2020 4315     Radiologic Imaging: No imaging on site today    Assessment:  JAKAYLEE SASAKI is a 65 y.o. 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 PET imaging concerning for endometrial primary. EMBx could not be performed due to cervical stenosis, but curettage performed in the OR on 12/13/2019 and concerning but not diagnostic of malignancy. S/p 7 cycles paclitaxel/carboplatin with evidence of response based in CA125 and PET. Underwent TAH, BSO, RS resection, loop ileostomy, mucous fistula 3/22.  A diverting loop ileostomy done given concern for possible large bowel obstruction at the levels of the cecum and the transverse colon; and a mucous fistula.  Cancer was involving uterus, both ovaries and sigmoid mesentery and there was residual disease.  Clearly is an endometrial high grade serous primary. 04/16/20 She resumed chemotherapy with paclitaxel and carboplatin, cycle #8. Received 10 cycles - last treatment 06/11/2020. Carboplatin AUC 5; Taxol decrease dose to 80%  Started Keytruda and Lenvima, but only got one cycle and admitted to [facility] for partial SBO  and got venting G tube placed and was eating well enough to be discharged.  Since then continues to eat small amounts with some vomiting.   HER2 negative, MSS  Pulmonary nodule, resolved. Stable irregular focal cystic change in the right lower lobe (series 3/image 91), unchanged  Hepatitis C, elevated AST/ALT normal bilirubin and albumin.   Medical co-morbidities complicating care: HTN and prior abdominal surgery and Hepatitis C.  Plan:   Problem List Items Addressed This Visit       Genitourinary   Endometrial cancer ([place]) - Primary    Continue to follow up with Dr Rogue Bussing next week to consider restarting Keytruda/Lenvima.  We can see her back in a few months or sooner as needed.  May be appropriate to transition to hospice if bowel obstruction progresses.  Hepatitis C. Continue to follow LFTs.    Mellody Drown, MD

## 2020-10-30 ENCOUNTER — Telehealth: Payer: Self-pay

## 2020-10-30 ENCOUNTER — Telehealth: Payer: Self-pay | Admitting: Internal Medicine

## 2020-10-30 NOTE — Telephone Encounter (Signed)
Returned call patient requested to change appointment time to afternoon, time changed and confirmed with patient. Patient verbalizes understanding

## 2020-10-30 NOTE — Telephone Encounter (Signed)
Pt call to change the time for her appt in the am. Please call back at [phone]

## 2020-10-30 NOTE — Telephone Encounter (Signed)
Called to inform patient about Mg and per NP, patient needs to be scheduled for fluids and Mg today. Husband states patient is seeing home RN at the current time and it is better for patient to come in Friday morning due to tight schedule today. Scheduler informed. Will call patient back with appointment time.

## 2020-10-30 NOTE — Telephone Encounter (Signed)
Changed to 1

## 2020-10-31 ENCOUNTER — Inpatient Hospital Stay: Payer: Medicare Other

## 2020-10-31 ENCOUNTER — Other Ambulatory Visit: Payer: Self-pay

## 2020-10-31 VITALS — BP 136/72 | HR 102 | Temp 97.3°F | Resp 18

## 2020-10-31 DIAGNOSIS — Z5112 Encounter for antineoplastic immunotherapy: Secondary | ICD-10-CM | POA: Diagnosis not present

## 2020-10-31 DIAGNOSIS — C541 Malignant neoplasm of endometrium: Secondary | ICD-10-CM

## 2020-10-31 MED ORDER — MAGNESIUM SULFATE 4 GM/100ML IV SOLN
4.0000 g | Freq: Once | INTRAVENOUS | Status: AC
  Administered 2020-10-31: 4 g via INTRAVENOUS
  Filled 2020-10-31: qty 100

## 2020-10-31 MED ORDER — SODIUM CHLORIDE 0.9 % IV SOLN
INTRAVENOUS | Status: DC
  Filled 2020-10-31 (×2): qty 250

## 2020-10-31 NOTE — Telephone Encounter (Signed)
Patient has follow up appt with Dr B on 11/3.  Consider restarting Lenvima at this appt.

## 2020-10-31 NOTE — Patient Instructions (Signed)
Magnesium Sulfate Injection What is this medication? MAGNESIUM SULFATE (mag NEE zee um SUL fate) prevents and treats low levels of magnesium in your body. It may also be used to prevent and treat seizures during pregnancy in people with high blood pressure disorders, such as preeclampsia or eclampsia. Magnesium plays an important role in maintaining the health of your muscles and nervous system. This medicine may be used for other purposes; ask your health care provider or pharmacist if you have questions. What should I tell my care team before I take this medication? They need to know if you have any of these conditions: Heart disease History of irregular heart beat Kidney disease An unusual or allergic reaction to magnesium sulfate, medications, foods, dyes, or preservatives Pregnant or trying to get pregnant Breast-feeding How should I use this medication? This medication is for infusion into a vein. It is given in a hospital or clinic setting. Talk to your care team about the use of this medication in children. While this medication may be prescribed for selected conditions, precautions do apply. Overdosage: If you think you have taken too much of this medicine contact a poison control center or emergency room at once. NOTE: This medicine is only for you. Do not share this medicine with others. What if I miss a dose? This does not apply. What may interact with this medication? Certain medications for anxiety or sleep Certain medications for seizures like phenobarbital Digoxin Medications that relax muscles for surgery Narcotic medications for pain This list may not describe all possible interactions. Give your health care provider a list of all the medicines, herbs, non-prescription drugs, or dietary supplements you use. Also tell them if you smoke, drink alcohol, or use illegal drugs. Some items may interact with your medicine. What should I watch for while using this medication? Your  condition will be monitored carefully while you are receiving this medication. You may need blood work done while you are receiving this medication. What side effects may I notice from receiving this medication? Side effects that you should report to your care team as soon as possible: Allergic reactions-skin rash, itching, hives, swelling of the face, lips, tongue, or throat High magnesium level-confusion, drowsiness, facial flushing, redness, sweating, muscle weakness, fast or irregular heartbeat, trouble breathing Low blood pressure-dizziness, feeling faint or lightheaded, blurry vision Side effects that usually do not require medical attention (report to your care team if they continue or are bothersome): Headache Nausea This list may not describe all possible side effects. Call your doctor for medical advice about side effects. You may report side effects to FDA at 1-800-FDA-1088. Where should I keep my medication? This medication is given in a hospital or clinic and will not be stored at home. NOTE: This sheet is a summary. It may not cover all possible information. If you have questions about this medicine, talk to your doctor, pharmacist, or health care provider.  2022 Elsevier/Gold Standard (2020-02-11 13:10:26)  

## 2020-10-31 NOTE — Progress Notes (Signed)
Pt here for IV fluids and magnesium. Reports that she feels weak and shaky, and endorses poor appetite. Denies nausea, vomiting, or diarrhea.  Prior to discharge, pt voiced that her [facility] nurse was concerned about appearance of skin around G-tube, and she wanted staff to check it. Gauze placement was noted over the anchor. Site was assessed and moderate exudate was noted, along with red moistened skin around insertion site. Area was cleansed with NS and clean gauze reapplied under anchor. Instructed pt on proper cleaning and application of dressing. Per Beckey Rutter, pt may also apply a barrier cream such as Desitin or Vaseline around insertion site after cleansing. Pt verbalized understanding.

## 2020-11-03 NOTE — Telephone Encounter (Signed)
Appt moved to 11/7 with Beckey Rutter.

## 2020-11-06 ENCOUNTER — Inpatient Hospital Stay: Payer: Medicare Other

## 2020-11-06 ENCOUNTER — Inpatient Hospital Stay: Payer: Medicare Other | Admitting: Internal Medicine

## 2020-11-10 ENCOUNTER — Inpatient Hospital Stay (HOSPITAL_BASED_OUTPATIENT_CLINIC_OR_DEPARTMENT_OTHER): Payer: Medicare Other | Admitting: Internal Medicine

## 2020-11-10 ENCOUNTER — Other Ambulatory Visit: Payer: Self-pay

## 2020-11-10 ENCOUNTER — Inpatient Hospital Stay: Payer: Medicare Other

## 2020-11-10 ENCOUNTER — Inpatient Hospital Stay: Payer: Medicare Other | Attending: Obstetrics and Gynecology

## 2020-11-10 ENCOUNTER — Encounter: Payer: Self-pay | Admitting: Internal Medicine

## 2020-11-10 DIAGNOSIS — F418 Other specified anxiety disorders: Secondary | ICD-10-CM | POA: Diagnosis not present

## 2020-11-10 DIAGNOSIS — N179 Acute kidney failure, unspecified: Secondary | ICD-10-CM | POA: Diagnosis not present

## 2020-11-10 DIAGNOSIS — E871 Hypo-osmolality and hyponatremia: Secondary | ICD-10-CM | POA: Insufficient documentation

## 2020-11-10 DIAGNOSIS — E876 Hypokalemia: Secondary | ICD-10-CM | POA: Diagnosis not present

## 2020-11-10 DIAGNOSIS — C786 Secondary malignant neoplasm of retroperitoneum and peritoneum: Secondary | ICD-10-CM | POA: Insufficient documentation

## 2020-11-10 DIAGNOSIS — C541 Malignant neoplasm of endometrium: Secondary | ICD-10-CM | POA: Insufficient documentation

## 2020-11-10 DIAGNOSIS — I10 Essential (primary) hypertension: Secondary | ICD-10-CM | POA: Diagnosis not present

## 2020-11-10 DIAGNOSIS — Z5112 Encounter for antineoplastic immunotherapy: Secondary | ICD-10-CM | POA: Insufficient documentation

## 2020-11-10 DIAGNOSIS — K56609 Unspecified intestinal obstruction, unspecified as to partial versus complete obstruction: Secondary | ICD-10-CM | POA: Diagnosis not present

## 2020-11-10 DIAGNOSIS — R110 Nausea: Secondary | ICD-10-CM

## 2020-11-10 DIAGNOSIS — R112 Nausea with vomiting, unspecified: Secondary | ICD-10-CM | POA: Insufficient documentation

## 2020-11-10 DIAGNOSIS — Z87891 Personal history of nicotine dependence: Secondary | ICD-10-CM | POA: Insufficient documentation

## 2020-11-10 LAB — CBC WITH DIFFERENTIAL/PLATELET
Abs Immature Granulocytes: 0.1 10*3/uL — ABNORMAL HIGH (ref 0.00–0.07)
Basophils Absolute: 0 10*3/uL (ref 0.0–0.1)
Basophils Relative: 0 %
Eosinophils Absolute: 0 10*3/uL (ref 0.0–0.5)
Eosinophils Relative: 0 %
HCT: 37.2 % (ref 36.0–46.0)
Hemoglobin: 13.2 g/dL (ref 12.0–15.0)
Immature Granulocytes: 1 %
Lymphocytes Relative: 12 %
Lymphs Abs: 1.2 10*3/uL (ref 0.7–4.0)
MCH: 35.1 pg — ABNORMAL HIGH (ref 26.0–34.0)
MCHC: 35.5 g/dL (ref 30.0–36.0)
MCV: 98.9 fL (ref 80.0–100.0)
Monocytes Absolute: 0.7 10*3/uL (ref 0.1–1.0)
Monocytes Relative: 8 %
Neutro Abs: 7.7 10*3/uL (ref 1.7–7.7)
Neutrophils Relative %: 79 %
Platelets: 276 10*3/uL (ref 150–400)
RBC: 3.76 MIL/uL — ABNORMAL LOW (ref 3.87–5.11)
RDW: 12.5 % (ref 11.5–15.5)
WBC: 9.8 10*3/uL (ref 4.0–10.5)
nRBC: 0 % (ref 0.0–0.2)

## 2020-11-10 LAB — COMPREHENSIVE METABOLIC PANEL
ALT: 27 U/L (ref 0–44)
AST: 31 U/L (ref 15–41)
Albumin: 4.2 g/dL (ref 3.5–5.0)
Alkaline Phosphatase: 65 U/L (ref 38–126)
Anion gap: 16 — ABNORMAL HIGH (ref 5–15)
BUN: 71 mg/dL — ABNORMAL HIGH (ref 8–23)
CO2: 26 mmol/L (ref 22–32)
Calcium: 9.6 mg/dL (ref 8.9–10.3)
Chloride: 84 mmol/L — ABNORMAL LOW (ref 98–111)
Creatinine, Ser: 2.73 mg/dL — ABNORMAL HIGH (ref 0.44–1.00)
GFR, Estimated: 19 mL/min — ABNORMAL LOW (ref >60)
Glucose, Bld: 115 mg/dL — ABNORMAL HIGH (ref 70–99)
Potassium: 4 mmol/L (ref 3.5–5.1)
Sodium: 126 mmol/L — ABNORMAL LOW (ref 135–145)
Total Bilirubin: 0.7 mg/dL (ref 0.3–1.2)
Total Protein: 8.6 g/dL — ABNORMAL HIGH (ref 6.5–8.1)

## 2020-11-10 LAB — TSH: TSH: 1.835 u[IU]/mL (ref 0.350–4.500)

## 2020-11-10 LAB — MAGNESIUM: Magnesium: 2.7 mg/dL — ABNORMAL HIGH (ref 1.7–2.4)

## 2020-11-10 MED ORDER — ONDANSETRON HCL 4 MG PO TABS
8.0000 mg | ORAL_TABLET | Freq: Once | ORAL | Status: AC
  Administered 2020-11-10: 8 mg via ORAL
  Filled 2020-11-10: qty 2

## 2020-11-10 MED ORDER — SODIUM CHLORIDE 0.9 % IV SOLN
Freq: Once | INTRAVENOUS | Status: AC
Start: 2020-11-10 — End: 2020-11-10
  Filled 2020-11-10: qty 250

## 2020-11-10 MED ORDER — SODIUM CHLORIDE 0.9 % IV SOLN: Freq: Once | INTRAVENOUS | Status: DC

## 2020-11-10 MED ORDER — ONDANSETRON HCL 4 MG/2ML IJ SOLN
8.0000 mg | Freq: Once | INTRAMUSCULAR | Status: AC
  Administered 2020-11-10: 8 mg via INTRAVENOUS
  Filled 2020-11-10: qty 4

## 2020-11-10 NOTE — Assessment & Plan Note (Addendum)
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  No new peritoneal disease noted.  Clinical progression noted treatment given recurrent small bowel obstruction [see below].  Currently on Keytruda s/p cycle #1; Lenvima on hold because of bowel obstruction.  #Hold Keytruda No. 2 because of acute renal failure/borderline performance status/low blood pressures [see below].  We will revisit evaluation for Keytruda at next visit/1 week.  Discussed with the patient and daughter that is less likely that patient's clinical status would improve just with IV fluids.  # Acute renal failure: Baseline creatinine is 0.8.  Today creatinine is 2.7.  Likely prerenal/ ? Motrin.  Recommend discontinuation of Motrin.  Plan IV fluids today.  Also plan fluids tomorrow/see below  #Nausea with vomiting-likely secondary to recurrent partial small bowel obstruction s/p PEG tube placement/gastric decompression/progression of malignancy proceed with Zofran today.  #Nausea/difficulty sleeping/anxiety-recommend Ativan; increase trazadone to 100 mg qhs..   # fatgiue- depression-STABLE; on prozac to 40 mg/day   #Prognosis: I had a long discussion with the patient and her husband; daughter over the phone-given patient's unfortunate poor prognosis-given her progressive disease/worsening symptoms/partial small bowel obstruction/renal failure-lack of effective available therapies.  Discussed regarding CODE STATUS; in the futility of full code given her poor prognosis.  No decisions were made.  Recommend evaluation with palliative care tomorrow.  # DISPOSITION: check Magnesium # HOLD keytruda; IVFs over 1 lit today # Reval in [place]/pallaitive care tomorrow- IVFs/ possible mag 2 gm.  #  Follow up in 1 week- MD; labs- cbc/cmp; mag possible IV  fluids;Keytruda .-Dr.B

## 2020-11-10 NOTE — Progress Notes (Signed)
[facility] NOTE  Patient Care Team: Danelle Berry, NP as PCP - General (Nurse Practitioner) Clent Jacks, RN as Oncology Nurse Navigator Cammie Sickle, MD as Consulting Physician (Internal Medicine) Gillis Ends, MD as Referring Physician (Obstetrics)  CHIEF COMPLAINTS/PURPOSE OF CONSULTATION: high grade serous cancer   #  Oncology History Overview Note  #August 2021-ascitic fluid cytology-high-grade serous carcinoma of gynecologic origin; CT scan-behind the abdominal wall 4.6 x 2.1 mass ; within the mesentery 6.7 x 4.5 cm left of mid abdomen . BIOPSY of the peritoneal mass; high grade carcinoma [id number]-[phone] [Dr.Byrnett]; SEP 9th 2021- PET scan-shows peritoneal carcinomatosis; uterine uptake [Dr.Secord; unable to biopsy cervical stenosis] sigmoid colonic uptake concerning for malignancy.  No ovarian uptake.  CEA 125 +1300; CEA-2.    #Hepatitis C/ectopic pregnancy/PRBC transfusion 1995-untreated.[Dr.Vanga]  # 09/14/2019-neoadjuvant CARBO-TAXOL s/p #4 cycles-partial response; DEC 9th 2021-laparoscopy-surgery aborted given the need for multiple bowel resections; DEc 22nd, 2021-proceed with neoadjuvant carbotaxol; STARTING cycle #6- AUC-5 sec to persistent thrombocytopenia.  #Status post #7 CarboTaxol -March 2022-debulking surgery TAH/BSO; bowel resections; ileostomy/mucous fistula.  # April 13th-restart CarboTaxol No. 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 GENETICS- NEG; HRD-    # PALLIATIVE CARE EVALUATION:  # PAIN MANAGEMENT:    DIAGNOSIS: Uterine cancer  STAGE:   IV      ;  GOALS: control  CURRENT/MOST RECENT THERAPY : Carbotaxol    Uterine cancer ([place]) (Resolved)  09/04/2019 Initial Diagnosis   Gynecologic cancer [facility])   Endometrial cancer ([place])  09/14/2019 - 06/12/2020 Chemotherapy   Patient is on  Treatment Plan : OVARIAN Carboplatin (AUC 6) / Paclitaxel (175) q21d x 6 cycles     10/05/2019 Initial Diagnosis   Endometrial cancer ([facility])    Genetic Testing   Negative germline genetic testing. No pathogenic variants identified on the [facility] panel. The report date is 10/01/2019. HRD testing was ordered but cancelled due to not enough sample/endometrial diagnosis.   The [facility] gene panel offered by [employer] includes sequencing and deletion/duplication testing of the following 35 genes: APC, ATM, AXIN2, BARD1, BMPR1A, BRCA1, BRCA2, BRIP1, CHD1, CDK4, CDKN2A, CHEK2, EPCAM (large rearrangement only), HOXB13, GALNT12, MLH1, MSH2, MSH3, MSH6, MUTYH, NBN, NTHL1, PALB2, PMS2, PTEN, RAD51C, RAD51D, RNF43, RPS20, SMAD4, STK11, and TP53. Sequencing was performed for select regions of POLE and POLD1, and large rearrangement analysis was performed for select regions of GREM1.    01/16/2020 Cancer Staging   Staging form: Corpus Uteri - Carcinoma and Carcinosarcoma, AJCC 8th Edition - Clinical: Stage IVB (cM1) - Signed by Cammie Sickle, MD on 01/16/2020    10/15/2020 -  Chemotherapy   Patient is on Treatment Plan : UTERINE Lenvatinib + Pembrolizumab q21d        HISTORY OF PRESENTING ILLNESS: Ambulating in a wheelchair.  Accompanied by husband.  Daughter on the phone.    Jeanette Allen 65 y.o.  female stage IV endometrial high-grade serous carcinoma-recurrent is currently on Keytruda plus [Lenvima not started sec to SBO].  Patient was recently admitted to the hospital at [facility] bowel obstruction.  S/p PEG tube/gastric decompression.   Complains of nausea/vomiting.  Feels poorly.  Poor p.o. intake.  Complains of abdominal discomfort.   Review of Systems  Constitutional:  Positive for malaise/fatigue and weight loss. Negative for chills, diaphoresis and fever.  HENT:  Negative for nosebleeds and sore throat.   Eyes:  Negative for  double vision.  Respiratory:   Negative for cough, hemoptysis, sputum production, shortness of breath and wheezing.   Cardiovascular:  Negative for chest pain, palpitations, orthopnea and leg swelling.  Gastrointestinal:  Positive for abdominal pain, nausea and vomiting. Negative for blood in stool, diarrhea, heartburn and melena.  Genitourinary:  Negative for dysuria, frequency and urgency.  Musculoskeletal:  Positive for back pain and myalgias. Negative for joint pain.  Skin: Negative.  Negative for itching and rash.  Neurological:  Negative for dizziness, tingling, focal weakness, weakness and headaches.  Endo/Heme/Allergies:  Does not bruise/bleed easily.  Psychiatric/Behavioral:  Negative for depression. The patient is not nervous/anxious and does not have insomnia.     MEDICAL HISTORY:  Past Medical History:  Diagnosis Date  . Arthritis   . Cancer ([place])   . Hypertension     SURGICAL HISTORY: Past Surgical History:  Procedure Laterality Date  . APPENDECTOMY    . COLONOSCOPY WITH PROPOFOL N/A 10/01/2019   Procedure: COLONOSCOPY WITH PROPOFOL;  Surgeon: Lin Landsman, MD;  Location: [facility] ENDOSCOPY;  Service: Gastroenterology;  Laterality: N/A;  . ESOPHAGOGASTRODUODENOSCOPY (EGD) WITH PROPOFOL N/A 09/12/2019   Procedure: ESOPHAGOGASTRODUODENOSCOPY (EGD) WITH PROPOFOL;  Surgeon: Lin Landsman, MD;  Location: [facility] ENDOSCOPY;  Service: Gastroenterology;  Laterality: N/A;    SOCIAL HISTORY: Social History   Socioeconomic History  . Marital status: Married    Spouse name: Not on file  . Number of children: Not on file  . Years of education: Not on file  . Highest education level: Not on file  Occupational History  . Not on file  Tobacco Use  . Smoking status: Former    Types: Cigarettes  . Smokeless tobacco: Never  Vaping Use  . Vaping Use: Never used  Substance and Sexual Activity  . Alcohol use: Not Currently  . Drug use: Never  . Sexual activity: Yes  Other Topics Concern  . Not on file   Social History Narrative   Lives with husband; near [place]. Worked Network engineer at [employer]; smoke 1/2 ppd/slowed; no alcohol; 3 children [2 boys; one girl- [employer]]   Social Determinants of Health   Financial Resource Strain: Not on file  Food Insecurity: Not on file  Transportation Needs: Not on file  Physical Activity: Not on file  Stress: Not on file  Social Connections: Not on file  Intimate Partner Violence: Not on file    FAMILY HISTORY: Family History  Problem Relation Age of Onset  . Diabetes Maternal Grandmother   . Hypertension Maternal Grandmother   . Stroke Maternal Grandmother     ALLERGIES:  has No Known Allergies.  MEDICATIONS:  Current Outpatient Medications  Medication Sig Dispense Refill  . FLUoxetine (PROZAC) 40 MG capsule Take 1 capsule (40 mg total) by mouth daily. 30 capsule 3  . LORazepam (ATIVAN) 0.5 MG tablet Take 1 tablet (0.5 mg total) by mouth every 8 (eight) hours as needed for anxiety. /nausea. 60 tablet 0  . omeprazole (PRILOSEC) 40 MG capsule Take 1 capsule (40 mg total) by mouth 2 (two) times daily before a meal. 60 capsule 1  . ondansetron (ZOFRAN) 8 MG tablet One pill every 8 hours as needed for nausea/vomitting. 60 tablet 1  . prochlorperazine (COMPAZINE) 10 MG tablet Take 1 tablet (10 mg total) by mouth every 6 (six) hours as needed for nausea or vomiting. 40 tablet 1  . traZODone (DESYREL) 50 MG tablet Take 1 tablet by mouth at bedtime as needed.    Marland Kitchen  acetaminophen (TYLENOL) 500 MG tablet Take 1 tablet (500 mg total) by mouth every 6 (six) hours as needed for mild pain, fever or headache (or Fever >/= 101). (Patient not taking: No sig reported) 30 tablet 0  . diphenhydramine-acetaminophen (TYLENOL PM) 25-500 MG TABS tablet Take 1 tablet by mouth at bedtime as needed. (Patient not taking: No sig reported)    . ergocalciferol (VITAMIN D2) 1.25 MG (50000 UT) capsule Take 50,000 Units by mouth every Monday. (Patient not taking: No sig  reported)    . lenvatinib 20 mg daily dose (LENVIMA, 20 MG DAILY DOSE,) 2 x 10 MG capsule Take 2 capsules (20 mg total) by mouth daily. (Patient not taking: No sig reported) 60 capsule 1  . ondansetron (ZOFRAN-ODT) 4 MG disintegrating tablet Take 4 mg by mouth every 8 (eight) hours as needed for nausea or vomiting. (Patient not taking: No sig reported)     No current facility-administered medications for this visit.      Marland Kitchen  PHYSICAL EXAMINATION: ECOG PERFORMANCE STATUS: 1 - Symptomatic but completely ambulatory  Vitals:   11/10/20 0924  BP: (!) 83/65  Pulse: (!) 125  Resp: 20  Temp: 97.8 F (36.6 C)  SpO2: 100%   Filed Weights   11/10/20 0924  Weight: 129 lb 6.4 oz (58.7 kg)     Physical Exam Constitutional:      Comments: Ambulating independently.  Accompanied by husband.  HENT:     Head: Normocephalic and atraumatic.     Mouth/Throat:     Pharynx: No oropharyngeal exudate.  Eyes:     Pupils: Pupils are equal, round, and reactive to light.  Cardiovascular:     Rate and Rhythm: Normal rate and regular rhythm.  Pulmonary:     Effort: Pulmonary effort is normal. No respiratory distress.     Breath sounds: Normal breath sounds. No wheezing.  Abdominal:     General: Bowel sounds are normal. There is distension.     Palpations: Abdomen is soft. There is no mass.     Tenderness: There is no abdominal tenderness. There is no guarding or rebound.  Musculoskeletal:        General: No tenderness. Normal range of motion.     Cervical back: Normal range of motion and neck supple.  Skin:    General: Skin is warm.  Neurological:     Mental Status: Jeanette Allen is alert and oriented to person, place, and time.  Psychiatric:        Mood and Affect: Affect normal.     LABORATORY DATA:  I have reviewed the data as listed Lab Results  Component Value Date   WBC 9.8 11/10/2020   HGB 13.2 11/10/2020   HCT 37.2 11/10/2020   MCV 98.9 11/10/2020   PLT 276 11/10/2020   Recent Labs     10/11/20 1142 10/12/20 0446 10/15/20 0903 10/29/20 1615 11/10/20 0905  NA 129*   < > 135 132* 126*  K 3.8   < > 3.6 3.6 4.0  CL 96*   < > 100 97* 84*  CO2 20*   < > _0 GLUCOSE 127*   < > 127* 116* 115*  BUN 22   < > 10 40* 71*  CREATININE 1.29*   < > 0.82 1.72* 2.73*  CALCIUM 8.3*   < > 7.6* 9.9 9.6  GFRNONAA 46*   < > >60 32* 19*  PROT 7.6   < > 7.3 8.1 8.6*  ALBUMIN 4.1   < >  3.6 3.9 4.2  AST 21   < > _0 ALT 21   < > _1 ALKPHOS 67   < > 58 66 65  BILITOT 1.1   < > 0.4 0.8 0.7  BILIDIR 0.2  --   --   --   --   IBILI 0.9  --   --   --   --    < > = values in this interval not displayed.    RADIOGRAPHIC STUDIES: I have personally reviewed the radiological images as listed and agreed with the findings in the report. DG Abd Portable 1V  Result Date: 10/13/2020 CLINICAL DATA:  Small-bowel obstruction EXAM: PORTABLE ABDOMEN - 1 VIEW COMPARISON:  10/12/2020 FINDINGS: Enteric tube is no longer present. Decreased small bowel distension. Right lower quadrant ostomy. IMPRESSION: Decreased small bowel distension. Enteric tube is no longer present. Electronically Signed   By: Macy Mis M.D.   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  No new peritoneal disease noted.  Clinical progression noted treatment given recurrent small bowel obstruction [see below].  Currently on Keytruda s/p cycle #1; Lenvima on hold because of bowel obstruction...  # .Acute renal failure:   #Nausea with vomiting-likely secondary to recurrent partial small bowel obstruction s/p PEG tube placement/gastric decompression.  #Nausea/difficulty sleeping/anxiety-recommend Ativan; cintrase trazadone to 100 mg qhs..   #  fatgiue- depression-STABLE; on prozac to 40 mg/day   # DISPOSITION: check Magnesium # HOLD keytruda; IVFs over 1 lit today # Reval in Josh/pallaitive care tomorrow- IVFs/ possible mag 2 gm.  #  Follow up in 1 week- MD; labs- cbc/cmp; mag possible IV fluids;Keytruda .-Dr.B   All questions were answered. The patient knows to call the clinic with any problems, questions or concerns.   Cammie Sickle, MD 11/11/2020 8:38 AM

## 2020-11-10 NOTE — Progress Notes (Signed)
Patient and her husband states patient is weak, nausea, and has no appetite. They states she can keep the little bit of food she does eat she can keep down. Patient has been feeling like this for a week now.Patient isn't sleeping because she is nausea. Patient is nausea with no vomiting. Only reports of vomiting on 11/5. Patient believes to be dehydrated. Patient states G-tube hurts every now and again.

## 2020-11-11 ENCOUNTER — Encounter: Payer: Self-pay | Admitting: Internal Medicine

## 2020-11-11 ENCOUNTER — Encounter: Payer: Self-pay | Admitting: Hospice and Palliative Medicine

## 2020-11-11 ENCOUNTER — Inpatient Hospital Stay (HOSPITAL_BASED_OUTPATIENT_CLINIC_OR_DEPARTMENT_OTHER): Payer: Medicare Other | Admitting: Hospice and Palliative Medicine

## 2020-11-11 ENCOUNTER — Inpatient Hospital Stay: Payer: Medicare Other

## 2020-11-11 VITALS — BP 100/73 | HR 110 | Wt 130.3 lb

## 2020-11-11 DIAGNOSIS — C541 Malignant neoplasm of endometrium: Secondary | ICD-10-CM

## 2020-11-11 DIAGNOSIS — Z515 Encounter for palliative care: Secondary | ICD-10-CM | POA: Diagnosis not present

## 2020-11-11 DIAGNOSIS — E860 Dehydration: Secondary | ICD-10-CM

## 2020-11-11 DIAGNOSIS — R110 Nausea: Secondary | ICD-10-CM

## 2020-11-11 DIAGNOSIS — Z5112 Encounter for antineoplastic immunotherapy: Secondary | ICD-10-CM | POA: Diagnosis not present

## 2020-11-11 LAB — CA 125: Cancer Antigen (CA) 125: 561 U/mL — ABNORMAL HIGH (ref 0.0–38.1)

## 2020-11-11 MED ORDER — SODIUM CHLORIDE 0.9 % IV SOLN
Freq: Once | INTRAVENOUS | Status: AC
  Filled 2020-11-11: qty 250

## 2020-11-11 NOTE — Patient Instructions (Signed)

## 2020-11-11 NOTE — Progress Notes (Signed)
Received IV hydration for poor oral intake. Denies any nausea today. VSS. Discharged to home.

## 2020-11-11 NOTE — Addendum Note (Signed)
Addended by: Altha Harm R on: 11/11/2020 10:52 AM   Modules accepted: Orders

## 2020-11-11 NOTE — Progress Notes (Signed)
Jeanette Allen  [phone] [phone]   Name: Jeanette Allen Date: 11/11/2020 MRN: [id number]  DOB: 09/20/1955  Patient Care Team: Danelle Berry, NP as PCP - General (Nurse Practitioner) Clent Jacks, RN as Oncology Nurse Navigator Cammie Sickle, MD as Consulting Physician (Internal Medicine) Gillis Ends, MD as Referring Physician (Obstetrics)    REASON FOR CONSULTATION: Jeanette Allen is a 65 y.o. 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  Patient was hospitalized several times in October 2022 at [facility] and [place] for recurrent SBO.  She is not felt to be a surgical candidate and venting G-tube was placed on 10/20/2020 for symptom relief.  Palliative care was consulted help address goals.  SOCIAL HISTORY:     reports that she has quit smoking. Her smoking use included cigarettes. She has never used smokeless tobacco. She reports that she does not currently use alcohol. She reports that she does not use drugs.  Patient is married and lives at home with her husband.  She was her husband's primary caregiver as he is an amputee.  She has a daughter who is involved.  Patient also has a son who lives nearby and another son in [place].  Patient formally worked in a [employer].  ADVANCE DIRECTIVES:  Not on file  CODE STATUS:   PAST MEDICAL HISTORY: Past Medical History:  Diagnosis Date   Arthritis    Cancer ([place])    Hypertension     PAST SURGICAL HISTORY:  Past Surgical History:  Procedure Laterality Date   APPENDECTOMY     COLONOSCOPY WITH PROPOFOL N/A 10/01/2019   Procedure: COLONOSCOPY WITH PROPOFOL;  Surgeon: Lin Landsman, MD;  Location: [facility] ENDOSCOPY;  Service:  Gastroenterology;  Laterality: N/A;   ESOPHAGOGASTRODUODENOSCOPY (EGD) WITH PROPOFOL N/A 09/12/2019   Procedure: ESOPHAGOGASTRODUODENOSCOPY (EGD) WITH PROPOFOL;  Surgeon: Lin Landsman, MD;  Location: [facility] ENDOSCOPY;  Service: Gastroenterology;  Laterality: N/A;    HEMATOLOGY/ONCOLOGY HISTORY:  Oncology History Overview Note  #August 2021-ascitic fluid cytology-high-grade serous carcinoma of gynecologic origin; CT scan-behind the abdominal wall 4.6 x 2.1 mass ; within the mesentery 6.7 x 4.5 cm left of mid abdomen . BIOPSY of the peritoneal mass; high grade carcinoma [phone] [Dr.Byrnett]; SEP 9th 2021- PET scan-shows peritoneal carcinomatosis; uterine uptake [Dr.Secord; unable to biopsy cervical stenosis] sigmoid colonic uptake concerning for malignancy.  No ovarian uptake.  CEA 125 +1300; CEA-2.    #Hepatitis C/ectopic pregnancy/PRBC transfusion 1995-untreated.[Dr.Vanga]  # 09/14/2019-neoadjuvant CARBO-TAXOL s/p #4 cycles-partial response; DEC 9th 2021-laparoscopy-surgery aborted given the need for multiple bowel resections; DEc 22nd, 2021-proceed with neoadjuvant carbotaxol; STARTING cycle #6- AUC-5 sec to persistent thrombocytopenia.  #Status post #7 CarboTaxol -March 2022-debulking surgery TAH/BSO; bowel resections; ileostomy/mucous fistula.  # April 13th-restart CarboTaxol No. 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 GENETICS- NEG; HRD-    # PALLIATIVE CARE EVALUATION:  # PAIN MANAGEMENT:    DIAGNOSIS: Uterine cancer  STAGE:   IV      ;  GOALS: control  CURRENT/MOST RECENT THERAPY : Carbotaxol    Uterine cancer ([place]) (Resolved)  09/04/2019 Initial Diagnosis   Gynecologic cancer [facility])   Endometrial cancer ([place])  09/14/2019 - 06/12/2020 Chemotherapy   Patient is on Treatment Plan : OVARIAN Carboplatin (  AUC 6) / Paclitaxel (175) q21d x 6 cycles      10/05/2019 Initial Diagnosis   Endometrial cancer (HCC)    Genetic Testing   Negative germline genetic testing. No pathogenic variants identified on the [facility] panel. The report date is 10/01/2019. HRD testing was ordered but cancelled due to not enough sample/endometrial diagnosis.   The [facility] gene panel offered by [employer] includes sequencing and deletion/duplication testing of the following 35 genes: APC, ATM, AXIN2, BARD1, BMPR1A, BRCA1, BRCA2, BRIP1, CHD1, CDK4, CDKN2A, CHEK2, EPCAM (large rearrangement only), HOXB13, GALNT12, MLH1, MSH2, MSH3, MSH6, MUTYH, NBN, NTHL1, PALB2, PMS2, PTEN, RAD51C, RAD51D, RNF43, RPS20, SMAD4, STK11, and TP53. Sequencing was performed for select regions of POLE and POLD1, and large rearrangement analysis was performed for select regions of GREM1.    01/16/2020 Cancer Staging   Staging form: Corpus Uteri - Carcinoma and Carcinosarcoma, AJCC 8th Edition - Clinical: Stage IVB (cM1) - Signed by Cammie Sickle, MD on 01/16/2020    10/15/2020 -  Chemotherapy   Patient is on Treatment Plan : UTERINE Lenvatinib + Pembrolizumab q21d       ALLERGIES:  has No Known Allergies.  MEDICATIONS:  Current Outpatient Medications  Medication Sig Dispense Refill   acetaminophen (TYLENOL) 500 MG tablet Take 1 tablet (500 mg total) by mouth every 6 (six) hours as needed for mild pain, fever or headache (or Fever >/= 101). (Patient not taking: No sig reported) 30 tablet 0   diphenhydramine-acetaminophen (TYLENOL PM) 25-500 MG TABS tablet Take 1 tablet by mouth at bedtime as needed. (Patient not taking: No sig reported)     ergocalciferol (VITAMIN D2) 1.25 MG (50000 UT) capsule Take 50,000 Units by mouth every Monday. (Patient not taking: No sig reported)     FLUoxetine (PROZAC) 40 MG capsule Take 1 capsule (40 mg total) by mouth daily. 30 capsule 3   lenvatinib 20 mg daily dose (LENVIMA, 20 MG DAILY DOSE,) 2 x 10 MG capsule Take 2 capsules (20  mg total) by mouth daily. (Patient not taking: No sig reported) 60 capsule 1   LORazepam (ATIVAN) 0.5 MG tablet Take 1 tablet (0.5 mg total) by mouth every 8 (eight) hours as needed for anxiety. /nausea. 60 tablet 0   omeprazole (PRILOSEC) 40 MG capsule Take 1 capsule (40 mg total) by mouth 2 (two) times daily before a meal. 60 capsule 1   ondansetron (ZOFRAN) 8 MG tablet One pill every 8 hours as needed for nausea/vomitting. 60 tablet 1   ondansetron (ZOFRAN-ODT) 4 MG disintegrating tablet Take 4 mg by mouth every 8 (eight) hours as needed for nausea or vomiting. (Patient not taking: No sig reported)     prochlorperazine (COMPAZINE) 10 MG tablet Take 1 tablet (10 mg total) by mouth every 6 (six) hours as needed for nausea or vomiting. 40 tablet 1   traZODone (DESYREL) 50 MG tablet Take 1 tablet by mouth at bedtime as needed.     No current facility-administered medications for this visit.    VITAL SIGNS: There were no vitals taken for this visit. There were no vitals filed for this visit.  Estimated body mass index is 22.21 kg/m as calculated from the following:   Height as of 10/11/20: _0  (1.626 m).   Weight as of 11/10/20: 129 lb 6.4 oz (58.7 kg).  LABS: CBC:    Component Value Date/Time   WBC 9.8 11/10/2020 0905   HGB 13.2 11/10/2020 0905   HCT 37.2 11/10/2020 0905  PLT 276 11/10/2020 0905   MCV 98.9 11/10/2020 0905   NEUTROABS 7.7 11/10/2020 0905   LYMPHSABS 1.2 11/10/2020 0905   MONOABS 0.7 11/10/2020 0905   EOSABS 0.0 11/10/2020 0905   BASOSABS 0.0 11/10/2020 0905   Comprehensive Metabolic Panel:    Component Value Date/Time   NA 126 (L) 11/10/2020 0905   K 4.0 11/10/2020 0905   CL 84 (L) 11/10/2020 0905   CO2 26 11/10/2020 0905   BUN 71 (H) 11/10/2020 0905   CREATININE 2.73 (H) 11/10/2020 0905   GLUCOSE 115 (H) 11/10/2020 0905   CALCIUM 9.6 11/10/2020 0905   AST 31 11/10/2020 0905   ALT 27 11/10/2020 0905   ALKPHOS 65 11/10/2020 0905   BILITOT 0.7 11/10/2020  0905   PROT 8.6 (H) 11/10/2020 0905   ALBUMIN 4.2 11/10/2020 0905    RADIOGRAPHIC STUDIES: DG Abd Portable 1V  Result Date: 10/13/2020 CLINICAL DATA:  Small-bowel obstruction EXAM: PORTABLE ABDOMEN - 1 VIEW COMPARISON:  10/12/2020 FINDINGS: Enteric tube is no longer present. Decreased small bowel distension. Right lower quadrant ostomy. IMPRESSION: Decreased small bowel distension. Enteric tube is no longer present. Electronically Signed   By: Macy Mis M.D.   On: 10/13/2020 08:38    PERFORMANCE STATUS (ECOG) : 2 - Symptomatic, <50% confined to bed  Review of Systems Unless otherwise noted, a complete review of systems is negative.  Physical Exam General: NAD Cardiovascular: regular rate and rhythm Pulmonary: clear ant fields Abdomen: Ostomy, venting PEG, abdomen soft and nontender GU: no suprapubic tenderness Extremities: no edema, no joint deformities Skin: no rashes Neurological: Weakness but otherwise nonfocal  IMPRESSION: I met with patient and introduced palliative care services.  Patient has done poorly over the past month with multiple hospitalizations and now with venting PEG due to recurrent SBO.  She endorses poor appetite and has been seen in clinic multiple times for dehydration requiring IV fluids.  Patient is also symptomatic with nausea requiring utilization of venting PEG 3-4 times a day.  Nausea is improved after gastric decompression.  Patient endorses a desire to continue pursuing cancer treatment.  She seems quite hopeful that Beryle Flock will result in significant improvement in her tumor burden.  Patient is also interested in frequent IV fluids for management.  I discussed with her that this is not an ideal long-term strategy but we will pursue follow-up later this week for fluids.  Daughter is also interested in a nutrition consultation.  We will order.  Patient says that she completed ACP documents at [facility].  However, it does not sound like family  knows where these documents are located.  Patient sent home with ACP documents and a MOST form.  PLAN: -Continue current scope of treatment -Nutrition referral -IV fluids on Friday -ACP documents reviewed -Patient RTC next week to see Dr. Jacinto Reap.  Discussed with Beckey Rutter, NP and will see if we can schedule follow-up GYN oncology visit in the near future.   Patient expressed understanding and was in agreement with this plan. She also understands that She can call the clinic at any time with any questions, concerns, or complaints.     Time Total: 25 minutes  Visit consisted of counseling and education dealing with the complex and emotionally intense issues of symptom management and palliative care in the setting of serious and potentially life-threatening illness.Greater than 50%  of this time was spent counseling and coordinating care related to the above assessment and plan.  Signed by: Altha Harm, PhD, NP-C

## 2020-11-11 NOTE — Progress Notes (Signed)
I called the daughter to check on the patient. As per the daughter patient is doing better after IV fluids. Continue follow up as planned. The daughter in agreement.

## 2020-11-12 ENCOUNTER — Other Ambulatory Visit: Payer: Self-pay | Admitting: *Deleted

## 2020-11-12 DIAGNOSIS — C541 Malignant neoplasm of endometrium: Secondary | ICD-10-CM

## 2020-11-14 ENCOUNTER — Inpatient Hospital Stay: Payer: Medicare Other

## 2020-11-14 ENCOUNTER — Other Ambulatory Visit: Payer: Self-pay

## 2020-11-14 VITALS — BP 116/66 | HR 95 | Temp 98.0°F | Resp 18

## 2020-11-14 DIAGNOSIS — C541 Malignant neoplasm of endometrium: Secondary | ICD-10-CM

## 2020-11-14 DIAGNOSIS — Z5112 Encounter for antineoplastic immunotherapy: Secondary | ICD-10-CM | POA: Diagnosis not present

## 2020-11-14 MED ORDER — SODIUM CHLORIDE 0.9 % IV SOLN: Freq: Once | INTRAVENOUS | Status: DC

## 2020-11-14 MED ORDER — SODIUM CHLORIDE 0.9 % IV SOLN
Freq: Once | INTRAVENOUS | Status: AC
  Filled 2020-11-14: qty 250

## 2020-11-14 MED ORDER — ONDANSETRON HCL 4 MG/2ML IJ SOLN
8.0000 mg | Freq: Once | INTRAMUSCULAR | Status: AC
  Administered 2020-11-14: 8 mg via INTRAVENOUS

## 2020-11-14 MED ORDER — DEXAMETHASONE SODIUM PHOSPHATE 10 MG/ML IJ SOLN
10.0000 mg | Freq: Once | INTRAMUSCULAR | Status: AC
  Administered 2020-11-14: 10 mg via INTRAVENOUS

## 2020-11-14 NOTE — Progress Notes (Signed)
Pt feels weak, nauseated. Appetite is not good. Received orders for antiemetic and IV steroid to be given along with 1 L NS over 1 hour. IV obtained in R hand #24 angiocath. Changed G tube dressing to assess redness around tube, caused gy gastric secretions being irritating, no signs of infection. Educ to keep clean and dry. May apply small amount of desitin around tube for the irritation.

## 2020-11-17 ENCOUNTER — Inpatient Hospital Stay (HOSPITAL_BASED_OUTPATIENT_CLINIC_OR_DEPARTMENT_OTHER): Payer: Medicare Other | Admitting: Hospice and Palliative Medicine

## 2020-11-17 ENCOUNTER — Other Ambulatory Visit: Payer: Self-pay

## 2020-11-17 ENCOUNTER — Inpatient Hospital Stay (HOSPITAL_BASED_OUTPATIENT_CLINIC_OR_DEPARTMENT_OTHER): Payer: Medicare Other | Admitting: Internal Medicine

## 2020-11-17 ENCOUNTER — Inpatient Hospital Stay: Payer: Medicare Other

## 2020-11-17 ENCOUNTER — Encounter: Payer: Self-pay | Admitting: Internal Medicine

## 2020-11-17 DIAGNOSIS — Z515 Encounter for palliative care: Secondary | ICD-10-CM

## 2020-11-17 DIAGNOSIS — C541 Malignant neoplasm of endometrium: Secondary | ICD-10-CM | POA: Diagnosis not present

## 2020-11-17 DIAGNOSIS — Z5112 Encounter for antineoplastic immunotherapy: Secondary | ICD-10-CM | POA: Diagnosis not present

## 2020-11-17 DIAGNOSIS — Z7189 Other specified counseling: Secondary | ICD-10-CM

## 2020-11-17 LAB — COMPREHENSIVE METABOLIC PANEL
ALT: 58 U/L — ABNORMAL HIGH (ref 0–44)
AST: 60 U/L — ABNORMAL HIGH (ref 15–41)
Albumin: 3.7 g/dL (ref 3.5–5.0)
Alkaline Phosphatase: 58 U/L (ref 38–126)
Anion gap: 10 (ref 5–15)
BUN: 44 mg/dL — ABNORMAL HIGH (ref 8–23)
CO2: 38 mmol/L — ABNORMAL HIGH (ref 22–32)
Calcium: 10.4 mg/dL — ABNORMAL HIGH (ref 8.9–10.3)
Chloride: 83 mmol/L — ABNORMAL LOW (ref 98–111)
Creatinine, Ser: 1.3 mg/dL — ABNORMAL HIGH (ref 0.44–1.00)
GFR, Estimated: 45 mL/min — ABNORMAL LOW (ref >60)
Glucose, Bld: 120 mg/dL — ABNORMAL HIGH (ref 70–99)
Potassium: 3.3 mmol/L — ABNORMAL LOW (ref 3.5–5.1)
Sodium: 131 mmol/L — ABNORMAL LOW (ref 135–145)
Total Bilirubin: 0.5 mg/dL (ref 0.3–1.2)
Total Protein: 7.5 g/dL (ref 6.5–8.1)

## 2020-11-17 LAB — CBC WITH DIFFERENTIAL/PLATELET
Abs Immature Granulocytes: 0.04 10*3/uL (ref 0.00–0.07)
Basophils Absolute: 0 10*3/uL (ref 0.0–0.1)
Basophils Relative: 0 %
Eosinophils Absolute: 0 10*3/uL (ref 0.0–0.5)
Eosinophils Relative: 0 %
HCT: 37.2 % (ref 36.0–46.0)
Hemoglobin: 12.5 g/dL (ref 12.0–15.0)
Immature Granulocytes: 1 %
Lymphocytes Relative: 15 %
Lymphs Abs: 0.9 10*3/uL (ref 0.7–4.0)
MCH: 34.6 pg — ABNORMAL HIGH (ref 26.0–34.0)
MCHC: 33.6 g/dL (ref 30.0–36.0)
MCV: 103 fL — ABNORMAL HIGH (ref 80.0–100.0)
Monocytes Absolute: 0.7 10*3/uL (ref 0.1–1.0)
Monocytes Relative: 11 %
Neutro Abs: 4.5 10*3/uL (ref 1.7–7.7)
Neutrophils Relative %: 73 %
Platelets: 215 10*3/uL (ref 150–400)
RBC: 3.61 MIL/uL — ABNORMAL LOW (ref 3.87–5.11)
RDW: 12.8 % (ref 11.5–15.5)
WBC: 6.1 10*3/uL (ref 4.0–10.5)
nRBC: 0 % (ref 0.0–0.2)

## 2020-11-17 LAB — MAGNESIUM: Magnesium: 2.3 mg/dL (ref 1.7–2.4)

## 2020-11-17 MED ORDER — LORAZEPAM 0.5 MG PO TABS
1.0000 mg | ORAL_TABLET | Freq: Once | ORAL | Status: AC
  Administered 2020-11-17: 1 mg via ORAL
  Filled 2020-11-17: qty 2

## 2020-11-17 MED ORDER — SODIUM CHLORIDE 0.9 % IV SOLN
Freq: Once | INTRAVENOUS | Status: AC
  Filled 2020-11-17: qty 250

## 2020-11-17 MED ORDER — SODIUM CHLORIDE 0.9 % IV SOLN
Freq: Once | INTRAVENOUS | Status: DC
  Filled 2020-11-17: qty 250

## 2020-11-17 MED ORDER — HEPARIN SOD (PORK) LOCK FLUSH 100 UNIT/ML IV SOLN
500.0000 [IU] | Freq: Once | INTRAVENOUS | Status: DC | PRN
  Filled 2020-11-17: qty 5

## 2020-11-17 MED ORDER — SODIUM CHLORIDE 0.9 % IV SOLN
200.0000 mg | Freq: Once | INTRAVENOUS | Status: AC
  Administered 2020-11-17: 200 mg via INTRAVENOUS
  Filled 2020-11-17: qty 8

## 2020-11-17 NOTE — Progress Notes (Signed)
Patient here for oncology follow-up appointment,  concerns of weakness  

## 2020-11-17 NOTE — Progress Notes (Signed)
[place]  Telephone:([phone] [phone]   Name: Jeanette Allen Date: 11/17/2020 MRN: [id number]  DOB: [birth year]  Patient Care Team: Danelle Berry, NP as PCP - General (Nurse Practitioner) Clent Jacks, RN as Oncology Nurse Navigator Cammie Sickle, MD as Consulting Physician (Internal Medicine) Gillis Ends, MD as Referring Physician (Obstetrics)    REASON FOR CONSULTATION: Jeanette Allen is a 65 y.o. 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  Patient was hospitalized several times in October 2022 at [facility] and [place] for recurrent SBO.  She is not felt to be a surgical candidate and venting G-tube was placed on 10/20/2020 for symptom relief.  Palliative care was consulted help address goals.  SOCIAL HISTORY:     reports that she has quit smoking. Her smoking use included cigarettes. She has never used smokeless tobacco. She reports that she does not currently use alcohol. She reports that she does not use drugs.  Patient is married and lives at home with her husband.  She was her husband's primary caregiver as he is an amputee.  She has a daughter who is involved.  Patient also has a son who lives nearby and another son in [place].  Patient formally worked in a [employer].  ADVANCE DIRECTIVES:  Not on file  CODE STATUS:   PAST MEDICAL HISTORY: Past Medical History:  Diagnosis Date   Arthritis    Cancer ([place])    Hypertension     PAST SURGICAL HISTORY:  Past Surgical History:  Procedure Laterality Date   APPENDECTOMY     COLONOSCOPY WITH PROPOFOL N/A 10/01/2019   Procedure: COLONOSCOPY WITH PROPOFOL;  Surgeon: Lin Landsman, MD;  Location: [facility] ENDOSCOPY;  Service:  Gastroenterology;  Laterality: N/A;   ESOPHAGOGASTRODUODENOSCOPY (EGD) WITH PROPOFOL N/A 09/12/2019   Procedure: ESOPHAGOGASTRODUODENOSCOPY (EGD) WITH PROPOFOL;  Surgeon: Lin Landsman, MD;  Location: [facility] ENDOSCOPY;  Service: Gastroenterology;  Laterality: N/A;    HEMATOLOGY/ONCOLOGY HISTORY:  Oncology History Overview Note  #August 2021-ascitic fluid cytology-high-grade serous carcinoma of gynecologic origin; CT scan-behind the abdominal wall 4.6 x 2.1 mass ; within the mesentery 6.7 x 4.5 cm left of mid abdomen . BIOPSY of the peritoneal mass; high grade carcinoma [phone] [Dr.Byrnett]; SEP 9th 2021- PET scan-shows peritoneal carcinomatosis; uterine uptake [Dr.Secord; unable to biopsy cervical stenosis] sigmoid colonic uptake concerning for malignancy.  No ovarian uptake.  CEA 125 +1300; CEA-2.    #Hepatitis C/ectopic pregnancy/PRBC transfusion 1995-untreated.[Dr.Vanga]  # 09/14/2019-neoadjuvant CARBO-TAXOL s/p #4 cycles-partial response; DEC 9th 2021-laparoscopy-surgery aborted given the need for multiple bowel resections; DEc 22nd, 2021-proceed with neoadjuvant carbotaxol; STARTING cycle #6- AUC-5 sec to persistent thrombocytopenia.  #Status post #7 CarboTaxol -March 2022-debulking surgery TAH/BSO; bowel resections; ileostomy/mucous fistula.  # April 13th-restart CarboTaxol No. 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 GENETICS- NEG; HRD-    # PALLIATIVE CARE EVALUATION:  # PAIN MANAGEMENT:    DIAGNOSIS: Uterine cancer  STAGE:   IV      ;  GOALS: control  CURRENT/MOST RECENT THERAPY : Carbotaxol    Uterine cancer ([place]) (Resolved)  09/04/2019 Initial Diagnosis   Gynecologic cancer [facility])   Endometrial cancer ([place])  09/14/2019 - 06/12/2020 Chemotherapy   Patient is on Treatment Plan : OVARIAN Carboplatin (  AUC 6) / Paclitaxel (175) q21d x 6 cycles      10/05/2019 Initial Diagnosis   Endometrial cancer (HCC)    Genetic Testing   Negative germline genetic testing. No pathogenic variants identified on the [facility] panel. The report date is 10/01/2019. HRD testing was ordered but cancelled due to not enough sample/endometrial diagnosis.   The [facility] gene panel offered by [employer] includes sequencing and deletion/duplication testing of the following 35 genes: APC, ATM, AXIN2, BARD1, BMPR1A, BRCA1, BRCA2, BRIP1, CHD1, CDK4, CDKN2A, CHEK2, EPCAM (large rearrangement only), HOXB13, GALNT12, MLH1, MSH2, MSH3, MSH6, MUTYH, NBN, NTHL1, PALB2, PMS2, PTEN, RAD51C, RAD51D, RNF43, RPS20, SMAD4, STK11, and TP53. Sequencing was performed for select regions of POLE and POLD1, and large rearrangement analysis was performed for select regions of GREM1.    01/16/2020 Cancer Staging   Staging form: Corpus Uteri - Carcinoma and Carcinosarcoma, AJCC 8th Edition - Clinical: Stage IVB (cM1) - Signed by Cammie Sickle, MD on 01/16/2020   10/15/2020 -  Chemotherapy   Patient is on Treatment Plan : UTERINE Lenvatinib + Pembrolizumab q21d       ALLERGIES:  has No Known Allergies.  MEDICATIONS:  Current Outpatient Medications  Medication Sig Dispense Refill   acetaminophen (TYLENOL) 500 MG tablet Take 1 tablet (500 mg total) by mouth every 6 (six) hours as needed for mild pain, fever or headache (or Fever >/= 101). 30 tablet 0   diphenhydramine-acetaminophen (TYLENOL PM) 25-500 MG TABS tablet Take 1 tablet by mouth at bedtime as needed. (Patient not taking: No sig reported)     ergocalciferol (VITAMIN D2) 1.25 MG (50000 UT) capsule Take 50,000 Units by mouth every Monday.     FLUoxetine (PROZAC) 40 MG capsule Take 1 capsule (40 mg total) by mouth daily. 30 capsule 3   lenvatinib 20 mg daily dose (LENVIMA, 20 MG DAILY DOSE,) 2 x 10 MG capsule Take 2 capsules (20 mg total) by mouth daily. 60 capsule 1   LORazepam (ATIVAN) 0.5 MG tablet Take  1 tablet (0.5 mg total) by mouth every 8 (eight) hours as needed for anxiety. /nausea. 60 tablet 0   omeprazole (PRILOSEC) 40 MG capsule Take 1 capsule (40 mg total) by mouth 2 (two) times daily before a meal. (Patient not taking: Reported on 11/17/2020) 60 capsule 1   ondansetron (ZOFRAN) 8 MG tablet One pill every 8 hours as needed for nausea/vomitting. 60 tablet 1   ondansetron (ZOFRAN-ODT) 4 MG disintegrating tablet Take 4 mg by mouth every 8 (eight) hours as needed for nausea or vomiting.     prochlorperazine (COMPAZINE) 10 MG tablet Take 1 tablet (10 mg total) by mouth every 6 (six) hours as needed for nausea or vomiting. 40 tablet 1   traZODone (DESYREL) 50 MG tablet Take 1 tablet by mouth at bedtime as needed.     No current facility-administered medications for this visit.   Facility-Administered Medications Ordered in Other Visits  Medication Dose Route Frequency Provider Last Rate Last Admin   0.9 %  sodium chloride infusion   Intravenous Once Charlaine Dalton R, MD       heparin lock flush 100 unit/mL  500 Units Intracatheter Once PRN Cammie Sickle, MD       pembrolizumab [facility]) 200 mg in sodium chloride 0.9 % 50 mL chemo infusion  200 mg Intravenous Once Cammie Sickle, MD        VITAL SIGNS: There were no vitals taken for this visit. There were no  vitals filed for this visit.  Estimated body mass index is 22.31 kg/m as calculated from the following:   Height as of 10/11/20: '5\' 4"'  (1.626 m).   Weight as of an earlier encounter on 11/17/20: 130 lb (59 kg).  LABS: CBC:    Component Value Date/Time   WBC 6.1 11/17/2020 0901   HGB 12.5 11/17/2020 0901   HCT 37.2 11/17/2020 0901   PLT 215 11/17/2020 0901   MCV 103.0 (H) 11/17/2020 0901   NEUTROABS 4.5 11/17/2020 0901   LYMPHSABS 0.9 11/17/2020 0901   MONOABS 0.7 11/17/2020 0901   EOSABS 0.0 11/17/2020 0901   BASOSABS 0.0 11/17/2020 0901   Comprehensive Metabolic Panel:    Component Value Date/Time    NA 131 (L) 11/17/2020 0901   K 3.3 (L) 11/17/2020 0901   CL 83 (L) 11/17/2020 0901   CO2 38 (H) 11/17/2020 0901   BUN 44 (H) 11/17/2020 0901   CREATININE 1.30 (H) 11/17/2020 0901   GLUCOSE 120 (H) 11/17/2020 0901   CALCIUM 10.4 (H) 11/17/2020 0901   AST 60 (H) 11/17/2020 0901   ALT 58 (H) 11/17/2020 0901   ALKPHOS 58 11/17/2020 0901   BILITOT 0.5 11/17/2020 0901   PROT 7.5 11/17/2020 0901   ALBUMIN 3.7 11/17/2020 0901    RADIOGRAPHIC STUDIES: No results found.  PERFORMANCE STATUS (ECOG) : 2 - Symptomatic, <50% confined to bed  Review of Systems Unless otherwise noted, a complete review of systems is negative.  Physical Exam General: NAD Cardiovascular: regular rate and rhythm Pulmonary: clear ant fields Abdomen: Ostomy, venting PEG, abdomen soft and nontender GU: no suprapubic tenderness Extremities: no edema, no joint deformities Skin: no rashes Neurological: Weakness but otherwise nonfocal  IMPRESSION: Routine follow-up.  Patient seen in infusion.  Patient reports she is doing better today.  She says she is felt markedly improved with fluids.  She is having less nausea and requiring drainage via venting PEG less frequently.  Patient denies any symptomatic complaints at present.  She reports that her daughter is still working on [employer].  PLAN: -Continue current scope of treatment -ACP documents previously reviewed -Patient RTC next week for [facility]   Patient expressed understanding and was in agreement with this plan. She also understands that She can call the clinic at any time with any questions, concerns, or complaints.     Time Total: 15 minutes  Visit consisted of counseling and education dealing with the complex and emotionally intense issues of symptom management and palliative care in the setting of serious and potentially life-threatening illness.Greater than 50%  of this time was spent counseling and coordinating care related to the above  assessment and plan.  Signed by: Altha Harm, PhD, NP-C

## 2020-11-17 NOTE — Progress Notes (Signed)
[place] NOTE  Patient Care Team: Danelle Berry, NP as PCP - General (Nurse Practitioner) Clent Jacks, RN as Oncology Nurse Navigator Cammie Sickle, MD as Consulting Physician (Internal Medicine) Gillis Ends, MD as Referring Physician (Obstetrics)  CHIEF COMPLAINTS/PURPOSE OF CONSULTATION: high grade serous cancer   #  Oncology History Overview Note  #August 2021-ascitic fluid cytology-high-grade serous carcinoma of gynecologic origin; CT scan-behind the abdominal wall 4.6 x 2.1 mass ; within the mesentery 6.7 x 4.5 cm left of mid abdomen . BIOPSY of the peritoneal mass; high grade carcinoma [phone] [Dr.Byrnett]; SEP 9th 2021- PET scan-shows peritoneal carcinomatosis; uterine uptake [Dr.Secord; unable to biopsy cervical stenosis] sigmoid colonic uptake concerning for malignancy.  No ovarian uptake.  CEA 125 +1300; CEA-2.    #Hepatitis C/ectopic pregnancy/PRBC transfusion 1995-untreated.[Dr.Vanga]  # 09/14/2019-neoadjuvant CARBO-TAXOL s/p #4 cycles-partial response; DEC 9th 2021-laparoscopy-surgery aborted given the need for multiple bowel resections; DEc 22nd, 2021-proceed with neoadjuvant carbotaxol; STARTING cycle #6- AUC-5 sec to persistent thrombocytopenia.  #Status post #7 CarboTaxol -March 2022-debulking surgery TAH/BSO; bowel resections; ileostomy/mucous fistula.  # April 13th-restart CarboTaxol No. 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 GENETICS- NEG; HRD-    # PALLIATIVE CARE EVALUATION:  # PAIN MANAGEMENT:    DIAGNOSIS: Uterine cancer  STAGE:   IV      ;  GOALS: control  CURRENT/MOST RECENT THERAPY : Carbotaxol    Uterine cancer ([place]) (Resolved)  09/04/2019 Initial Diagnosis   Gynecologic cancer [facility])   Endometrial cancer ([place])  09/14/2019 - 06/12/2020 Chemotherapy   Patient is on  Treatment Plan : OVARIAN Carboplatin (AUC 6) / Paclitaxel (175) q21d x 6 cycles     10/05/2019 Initial Diagnosis   Endometrial cancer ([facility])    Genetic Testing   Negative germline genetic testing. No pathogenic variants identified on the [facility] panel. The report date is 10/01/2019. HRD testing was ordered but cancelled due to not enough sample/endometrial diagnosis.   The [facility] gene panel offered by [employer] includes sequencing and deletion/duplication testing of the following 35 genes: APC, ATM, AXIN2, BARD1, BMPR1A, BRCA1, BRCA2, BRIP1, CHD1, CDK4, CDKN2A, CHEK2, EPCAM (large rearrangement only), HOXB13, GALNT12, MLH1, MSH2, MSH3, MSH6, MUTYH, NBN, NTHL1, PALB2, PMS2, PTEN, RAD51C, RAD51D, RNF43, RPS20, SMAD4, STK11, and TP53. Sequencing was performed for select regions of POLE and POLD1, and large rearrangement analysis was performed for select regions of GREM1.    01/16/2020 Cancer Staging   Staging form: Corpus Uteri - Carcinoma and Carcinosarcoma, AJCC 8th Edition - Clinical: Stage IVB (cM1) - Signed by Cammie Sickle, MD on 01/16/2020    10/15/2020 -  Chemotherapy   Patient is on Treatment Plan : UTERINE Lenvatinib + Pembrolizumab q21d        HISTORY OF PRESENTING ILLNESS: Ambulating in a wheelchair.  Accompanied by husband.    Jeanette Allen 65 y.o.  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  S/p PEG tube/gastric decompression. -recurrent is currently on Keytruda plus [Lenvima not started sec to SBO].  Patient's Beryle Flock was held last week because of acute renal failure.  Patient received IV fluids.  Complains of intermittent nausea/vomiting.   Poor p.o. intake.  Complains of intermittent abdominal discomfort.  However overall she is slightly improved compared to last visit a week ago.  Review of Systems  Constitutional:  Positive for malaise/fatigue and weight loss. Negative for chills,  diaphoresis  and fever.  HENT:  Negative for nosebleeds and sore throat.   Eyes:  Negative for double vision.  Respiratory:  Negative for cough, hemoptysis, sputum production, shortness of breath and wheezing.   Cardiovascular:  Negative for chest pain, palpitations, orthopnea and leg swelling.  Gastrointestinal:  Positive for abdominal pain, nausea and vomiting. Negative for blood in stool, diarrhea, heartburn and melena.  Genitourinary:  Negative for dysuria, frequency and urgency.  Musculoskeletal:  Positive for back pain and myalgias. Negative for joint pain.  Skin: Negative.  Negative for itching and rash.  Neurological:  Negative for dizziness, tingling, focal weakness, weakness and headaches.  Endo/Heme/Allergies:  Does not bruise/bleed easily.  Psychiatric/Behavioral:  Negative for depression. The patient is not nervous/anxious and does not have insomnia.     MEDICAL HISTORY:  Past Medical History:  Diagnosis Date  . Arthritis   . Cancer ([place])   . Hypertension     SURGICAL HISTORY: Past Surgical History:  Procedure Laterality Date  . APPENDECTOMY    . COLONOSCOPY WITH PROPOFOL N/A 10/01/2019   Procedure: COLONOSCOPY WITH PROPOFOL;  Surgeon: Lin Landsman, MD;  Location: [facility] ENDOSCOPY;  Service: Gastroenterology;  Laterality: N/A;  . ESOPHAGOGASTRODUODENOSCOPY (EGD) WITH PROPOFOL N/A 09/12/2019   Procedure: ESOPHAGOGASTRODUODENOSCOPY (EGD) WITH PROPOFOL;  Surgeon: Lin Landsman, MD;  Location[facility] ENDOSCOPY;  Service: Gastroenterology;  Laterality: N/A;    SOCIAL HISTORY: Social History   Socioeconomic History  . Marital status: Married    Spouse name: Not on file  . Number of children: Not on file  . Years of education: Not on file  . Highest education level: Not on file  Occupational History  . Not on file  Tobacco Use  . Smoking status: Former    Types: Cigarettes  . Smokeless tobacco: Never  Vaping Use  . Vaping Use: Never used  Substance and  Sexual Activity  . Alcohol use: Not Currently  . Drug use: Never  . Sexual activity: Yes  Other Topics Concern  . Not on file  Social History Narrative   Lives with husband; near [place]. Worked Network engineer at [employer]; smoke 1/2 ppd/slowed; no alcohol; 3 children [2 boys; one girl- [employer]]   Social Determinants of Health   Financial Resource Strain: Not on file  Food Insecurity: Not on file  Transportation Needs: Not on file  Physical Activity: Not on file  Stress: Not on file  Social Connections: Not on file  Intimate Partner Violence: Not on file    FAMILY HISTORY: Family History  Problem Relation Age of Onset  . Diabetes Maternal Grandmother   . Hypertension Maternal Grandmother   . Stroke Maternal Grandmother     ALLERGIES:  has No Known Allergies.  MEDICATIONS:  Current Outpatient Medications  Medication Sig Dispense Refill  . acetaminophen (TYLENOL) 500 MG tablet Take 1 tablet (500 mg total) by mouth every 6 (six) hours as needed for mild pain, fever or headache (or Fever >/= 101). 30 tablet 0  . ergocalciferol (VITAMIN D2) 1.25 MG (50000 UT) capsule Take 50,000 Units by mouth every Monday.    Marland Kitchen FLUoxetine (PROZAC) 40 MG capsule Take 1 capsule (40 mg total) by mouth daily. 30 capsule 3  . lenvatinib 20 mg daily dose (LENVIMA, 20 MG DAILY DOSE,) 2 x 10 MG capsule Take 2 capsules (20 mg total) by mouth daily. 60 capsule 1  . LORazepam (ATIVAN) 0.5 MG tablet Take 1 tablet (0.5 mg total) by mouth every 8 (eight) hours as needed  for anxiety. /nausea. 60 tablet 0  . ondansetron (ZOFRAN) 8 MG tablet One pill every 8 hours as needed for nausea/vomitting. 60 tablet 1  . ondansetron (ZOFRAN-ODT) 4 MG disintegrating tablet Take 4 mg by mouth every 8 (eight) hours as needed for nausea or vomiting.    . prochlorperazine (COMPAZINE) 10 MG tablet Take 1 tablet (10 mg total) by mouth every 6 (six) hours as needed for nausea or vomiting. 40 tablet 1  . traZODone (DESYREL) 50  MG tablet Take 1 tablet by mouth at bedtime as needed.    . diphenhydramine-acetaminophen (TYLENOL PM) 25-500 MG TABS tablet Take 1 tablet by mouth at bedtime as needed. (Patient not taking: No sig reported)    . omeprazole (PRILOSEC) 40 MG capsule Take 1 capsule (40 mg total) by mouth 2 (two) times daily before a meal. (Patient not taking: Reported on 11/17/2020) 60 capsule 1   No current facility-administered medications for this visit.   Facility-Administered Medications Ordered in Other Visits  Medication Dose Route Frequency Provider Last Rate Last Admin  . 0.9 %  sodium chloride infusion   Intravenous Once Charlaine Dalton R, MD      . heparin lock flush 100 unit/mL  500 Units Intracatheter Once PRN Cammie Sickle, MD          .  PHYSICAL EXAMINATION: ECOG PERFORMANCE STATUS: 1 - Symptomatic but completely ambulatory  Vitals:   11/17/20 0946  BP: 103/72  Pulse: (!) 116  Resp: 17  Temp: (!) 97 F (36.1 C)  SpO2: 100%   Filed Weights   11/17/20 0946  Weight: 130 lb (59 kg)     Physical Exam Constitutional:      Comments: Ambulating independently.  Accompanied by husband.  HENT:     Head: Normocephalic and atraumatic.     Mouth/Throat:     Pharynx: No oropharyngeal exudate.  Eyes:     Pupils: Pupils are equal, round, and reactive to light.  Cardiovascular:     Rate and Rhythm: Normal rate and regular rhythm.  Pulmonary:     Effort: Pulmonary effort is normal. No respiratory distress.     Breath sounds: Normal breath sounds. No wheezing.  Abdominal:     General: Bowel sounds are normal. There is distension.     Palpations: Abdomen is soft. There is no mass.     Tenderness: There is no abdominal tenderness. There is no guarding or rebound.  Musculoskeletal:        General: No tenderness. Normal range of motion.     Cervical back: Normal range of motion and neck supple.  Skin:    General: Skin is warm.  Neurological:     Mental Status: She is alert  and oriented to person, place, and time.  Psychiatric:        Mood and Affect: Affect normal.     LABORATORY DATA:  I have reviewed the data as listed Lab Results  Component Value Date   WBC 6.1 11/17/2020   HGB 12.5 11/17/2020   HCT 37.2 11/17/2020   MCV 103.0 (H) 11/17/2020   PLT 215 11/17/2020   Recent Labs    10/11/20 1142 10/12/20 0446 10/29/20 1615 11/10/20 0905 11/17/20 0901  NA 129*   < > 132* 126* 131*  K 3.8   < > 3.6 4.0 3.3*  CL 96*   < > 97* 84* 83*  CO2 20*   < > 24 26 38*  GLUCOSE 127*   < > 116*  115* 120*  BUN 22   < > 40* 71* 44*  CREATININE 1.29*   < > 1.72* 2.73* 1.30*  CALCIUM 8.3*   < > 9.9 9.6 10.4*  GFRNONAA 46*   < > 32* 19* 45*  PROT 7.6   < > 8.1 8.6* 7.5  ALBUMIN 4.1   < > 3.9 4.2 3.7  AST 21   < > 22 31 60*  ALT 21   < > 18 27 58*  ALKPHOS 67   < > 66 65 58  BILITOT 1.1   < > 0.8 0.7 0.5  BILIDIR 0.2  --   --   --   --   IBILI 0.9  --   --   --   --    < > = values in this interval not displayed.    RADIOGRAPHIC STUDIES: I have personally reviewed the radiological images as listed and agreed with the findings in the report. No results found.  ASSESSMENT & PLAN:   Endometrial cancer ([place]) #Recurrent/progressive STAGE IV-High-grade serous carcinoma endometrial] pre-treatment- August 2022-PET scan no obvious evidence of any progression noted; minimal residual nodularity in the left upper quadrant without any clear metabolic uptake.  No new peritoneal disease noted.  Clinical progression noted treatment given recurrent small bowel obstruction [see below].  Currently on Keytruda s/p cycle #1; Lenvima on hold because of bowel obstruction.  # Proceed with keytruda #2. Labs today reviewed;  acceptable for treatment today.  Unfortunately the chance of treatment working is quite modest.  Development worker, international aid for now.  Hold off Lenvima because of bowel obstruction/and high risk of side effects.  # Acute renal failure: today creatinine is  1.3/improved from 2.7.  Continue IV fluids as needed.  #Nausea with vomiting-likely secondary to recurrent partial small bowel obstruction s/p PEG tube placement/gastric decompression/progression of malignancy.  Continue monitoring for now.  #Nausea/difficulty sleeping/anxiety- continue Ativan/ trazadone to 100 mg qhs- STABLE.   # fatgiue- depression-STABLE; on prozac to 40 mg/day    # DISPOSITION:  # proceed with [place]; IVFs over 1 lit today; NO mag # this week- Wed/Friday- Possible 1 lit over 1 hour # next week- [facility]-labs- cbc/bmp; possible IVFs.  #  Follow up in 3 week- MD; labs- cbc/cmp; mag possible IV fluids;Keytruda .-Dr.B    All questions were answered. The patient knows to call the clinic with any problems, questions or concerns.   Cammie Sickle, MD 11/17/2020 12:45 PM

## 2020-11-17 NOTE — Progress Notes (Signed)
HR 116 ok to proceed per MD 

## 2020-11-17 NOTE — Patient Instructions (Signed)
[facility] [place] [facility]  Discharge Instructions: Thank you for choosing [facility] to provide your oncology and hematology care.  If you have a lab appointment with the Cancer Center, please go directly to the Cancer Center and check in at the registration area.  Wear comfortable clothing and clothing appropriate for easy access to any Portacath or PICC line.   We strive to give you quality time with your provider. You may need to reschedule your appointment if you arrive late (15 or more minutes).  Arriving late affects you and other patients whose appointments are after yours.  Also, if you miss three or more appointments without notifying the office, you may be dismissed from the clinic at the provider's discretion.      For prescription refill requests, have your pharmacy contact our office and allow 72 hours for refills to be completed.    Today you received the following chemotherapy and/or immunotherapy agents : Keytruda    To help prevent nausea and vomiting after your treatment, we encourage you to take your nausea medication as directed.  BELOW ARE SYMPTOMS THAT SHOULD BE REPORTED IMMEDIATELY: *FEVER GREATER THAN 100.4 F (38 C) OR HIGHER *CHILLS OR SWEATING *NAUSEA AND VOMITING THAT IS NOT CONTROLLED WITH YOUR NAUSEA MEDICATION *UNUSUAL SHORTNESS OF BREATH *UNUSUAL BRUISING OR BLEEDING *URINARY PROBLEMS (pain or burning when urinating, or frequent urination) *BOWEL PROBLEMS (unusual diarrhea, constipation, pain near the anus) TENDERNESS IN MOUTH AND THROAT WITH OR WITHOUT PRESENCE OF ULCERS (sore throat, sores in mouth, or a toothache) UNUSUAL RASH, SWELLING OR PAIN  UNUSUAL VAGINAL DISCHARGE OR ITCHING   Items with * indicate a potential emergency and should be followed up as soon as possible or go to the Emergency Department if any problems should occur.  Please show the CHEMOTHERAPY ALERT CARD or IMMUNOTHERAPY ALERT CARD at check-in  to the Emergency Department and triage nurse.  Should you have questions after your visit or need to cancel or reschedule your appointment, please contact [facility]  [phone] and follow the prompts.  Office hours are 8:00 a.m. to 4:30 p.m. Monday - Friday. Please note that voicemails left after 4:00 p.m. may not be returned until the following business day.  We are closed weekends and major holidays. You have access to a nurse at all times for urgent questions. Please call the main number to the clinic [phone] and follow the prompts.  For any non-urgent questions, you may also contact your provider using MyChart. We now offer e-Visits for anyone 18 and older to request care online for non-urgent symptoms. For details visit [email or web address].   Also download the MyChart app! Go to the app store, search "MyChart", open the app, select [facility], and log in with your MyChart username and password.  Due to Covid, a mask is required upon entering the hospital/clinic. If you do not have a mask, one will be given to you upon arrival. For doctor visits, patients may have 1 support person aged 18 or older with them. For treatment visits, patients cannot have anyone with them due to current Covid guidelines and our immunocompromised population.  

## 2020-11-17 NOTE — Assessment & Plan Note (Addendum)
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  No new peritoneal disease noted.  Clinical progression noted treatment given recurrent small bowel obstruction [see below].  Currently on Keytruda s/p cycle #1; Lenvima on hold because of bowel obstruction.  # Proceed with keytruda #2. Labs today reviewed;  acceptable for treatment today.  Unfortunately the chance of treatment working is quite modest.  Development worker, international aid for now.  Hold off Lenvima because of bowel obstruction/and high risk of side effects.  # Acute renal failure: today creatinine is 1.3/improved from 2.7.  Continue IV fluids as needed.  #Nausea with vomiting-likely secondary to recurrent partial small bowel obstruction s/p PEG tube placement/gastric decompression/progression of malignancy.  Continue monitoring for now.  #Nausea/difficulty sleeping/anxiety- continue Ativan/ trazadone to 100 mg qhs- STABLE.   # fatgiue- depression-STABLE; on prozac to 40 mg/day    # DISPOSITION:  # proceed with [place]; IVFs over 1 lit today; NO mag # this week- Wed/Friday- Possible 1 lit over 1 hour # next week- [facility]-labs- cbc/bmp; possible IVFs.  #  Follow up in 3 week- MD; labs- cbc/cmp; mag possible IV fluids;Keytruda .-Dr.B

## 2020-11-17 NOTE — Progress Notes (Signed)
Per Dr. Rogue Bussing,  can proceed with treatment with a HR of 116.

## 2020-11-19 ENCOUNTER — Other Ambulatory Visit: Payer: Self-pay

## 2020-11-19 ENCOUNTER — Inpatient Hospital Stay: Payer: Medicare Other

## 2020-11-19 VITALS — BP 100/72 | HR 109 | Temp 96.9°F | Resp 16

## 2020-11-19 DIAGNOSIS — C541 Malignant neoplasm of endometrium: Secondary | ICD-10-CM

## 2020-11-19 DIAGNOSIS — Z5112 Encounter for antineoplastic immunotherapy: Secondary | ICD-10-CM | POA: Diagnosis not present

## 2020-11-19 MED ORDER — SODIUM CHLORIDE 0.9% FLUSH
10.0000 mL | Freq: Once | INTRAVENOUS | Status: DC | PRN
  Filled 2020-11-19: qty 10

## 2020-11-19 MED ORDER — HEPARIN SOD (PORK) LOCK FLUSH 100 UNIT/ML IV SOLN
500.0000 [IU] | Freq: Once | INTRAVENOUS | Status: DC | PRN
  Filled 2020-11-19: qty 5

## 2020-11-19 MED ORDER — SODIUM CHLORIDE 0.9 % IV SOLN
Freq: Once | INTRAVENOUS | Status: AC
Start: 2020-11-19 — End: 2020-11-19
  Filled 2020-11-19: qty 250

## 2020-11-19 MED ORDER — ONDANSETRON HCL 4 MG/2ML IJ SOLN
8.0000 mg | Freq: Once | INTRAMUSCULAR | Status: AC
  Administered 2020-11-19: 8 mg via INTRAVENOUS
  Filled 2020-11-19: qty 4

## 2020-11-19 MED ORDER — SODIUM CHLORIDE 0.9 % IV SOLN: Freq: Once | INTRAVENOUS | Status: DC

## 2020-11-19 NOTE — Progress Notes (Signed)
Pt received IV hydration with nausea medicine. Pt has agreed to a port a cath as her veins are getting harder to obtain peripheral IVs. Informed her she would receive a phone call with that appt [facility]. Discharged to home.

## 2020-11-19 NOTE — Progress Notes (Signed)
Called patient at [phone] today on and spoke with patient and husband, Jeanette Allen, regarding scheduled procedure for port-a-catheter placement Friday, 11/21/2020. Discussed: Arrival time of 0930 patient verbalized arrival time of 0930; Medication details and is not on any blood thinners, will take AM meds with only a sip of H2O, patient reports she has no known allergies and confirmed in chart, patient reports she is not a diabetic, discussed NPO after midnight day of procedure, Need of responsible driver to drive home (driver will be husband Jeanette Allen), No valuables, Comfortable clothing and patient verbalized again the arrival time of 0930, and verbalized understanding of instructions.  Questions answered.

## 2020-11-19 NOTE — Patient Instructions (Signed)

## 2020-11-20 ENCOUNTER — Telehealth: Payer: Self-pay | Admitting: *Deleted

## 2020-11-20 ENCOUNTER — Other Ambulatory Visit: Payer: Self-pay | Admitting: Radiology

## 2020-11-20 ENCOUNTER — Encounter: Payer: Self-pay | Admitting: Internal Medicine

## 2020-11-20 NOTE — Telephone Encounter (Signed)
Patient called with question about the necessity of port placement tomorrow. She expressed that she was feeling overwhelmed with the PEG tube.

## 2020-11-20 NOTE — H&P (Signed)
Chief Complaint: Patient was seen in consultation today for port-a-catheter placement   Referring Physician(s): Cammie Sickle  Supervising Physician: Sandi Mariscal  Patient Status: [facility] - Out-pt  History of Present Illness: Jeanette Allen is a 65 y.o. 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 She is s/p neoadjuvant chemotherapy, debulking surgery, hysterectomy/BSO, omenectomy and left hemicolectomy. She was hospitalized several times October 2022 at [facility] and [place] for recurrent SBO. A venting g-tube was placed 10/20/20 for symptom relief and she is followed by Palliative care. She receives intermittent IV hydration and the oncology team has had an increasingly difficult time with obtaining venous access.  Interventional Radiology has been asked to evaluate this patient for an image-guided port-a-catheter placement for poor venous access. She is familiar to IR from a paracentesis 08/27/19 and a peritoneal mass biopsy 09/07/19.  Past Medical History:  Diagnosis Date   Arthritis    Cancer ([place])    Hypertension     Past Surgical History:  Procedure Laterality Date   APPENDECTOMY     COLONOSCOPY WITH PROPOFOL N/A 10/01/2019   Procedure: COLONOSCOPY WITH PROPOFOL;  Surgeon: Lin Landsman, MD;  Location: [facility] ENDOSCOPY;  Service: Gastroenterology;  Laterality: N/A;   ESOPHAGOGASTRODUODENOSCOPY (EGD) WITH PROPOFOL N/A 09/12/2019   Procedure: ESOPHAGOGASTRODUODENOSCOPY (EGD) WITH PROPOFOL;  Surgeon: Lin Landsman, MD;  Location: [facility] ENDOSCOPY;  Service: Gastroenterology;  Laterality: N/A;    Allergies: Patient has no known allergies.  Medications: Prior to Admission medications   Medication Sig Start Date End Date Taking? Authorizing Provider  acetaminophen (TYLENOL) 500 MG tablet Take 1 tablet (500 mg total) by mouth every 6 (six) hours as needed for mild pain, fever or headache (or  Fever >/= 101). 10/14/20   Cherene Altes, MD  diphenhydramine-acetaminophen (TYLENOL PM) 25-500 MG TABS tablet Take 1 tablet by mouth at bedtime as needed. Patient not taking: No sig reported    [provider]  ergocalciferol (VITAMIN D2) 1.25 MG (50000 UT) capsule Take 50,000 Units by mouth every Monday.    [provider]  FLUoxetine (PROZAC) 40 MG capsule Take 1 capsule (40 mg total) by mouth daily. 04/16/20   Cammie Sickle, MD  lenvatinib 20 mg daily dose (LENVIMA, 20 MG DAILY DOSE,) 2 x 10 MG capsule Take 2 capsules (20 mg total) by mouth daily. 10/03/20   Cammie Sickle, MD  LORazepam (ATIVAN) 0.5 MG tablet Take 1 tablet (0.5 mg total) by mouth every 8 (eight) hours as needed for anxiety. /nausea. 10/15/20   Cammie Sickle, MD  omeprazole (PRILOSEC) 40 MG capsule Take 1 capsule (40 mg total) by mouth 2 (two) times daily before a meal. Patient not taking: Reported on 11/17/2020 09/17/20   Cammie Sickle, MD  ondansetron (ZOFRAN) 8 MG tablet One pill every 8 hours as needed for nausea/vomitting. 10/05/19   Cammie Sickle, MD  ondansetron (ZOFRAN-ODT) 4 MG disintegrating tablet Take 4 mg by mouth every 8 (eight) hours as needed for nausea or vomiting.    [provider]  prochlorperazine (COMPAZINE) 10 MG tablet Take 1 tablet (10 mg total) by mouth every 6 (six) hours as needed for nausea or vomiting. 05/07/20   Cammie Sickle, MD  traZODone (DESYREL) 50 MG tablet Take 1 tablet by mouth at bedtime as needed. 10/22/20   [provider]     Family History  Problem Relation Age of Onset   Diabetes Maternal Grandmother  Hypertension Maternal Grandmother    Stroke Maternal Grandmother     Social History   Socioeconomic History   Marital status: Married    Spouse name: Annie Main   Number of children: 3   Years of education: Not on file   Highest education level: Not on file  Occupational History   Not on file   Tobacco Use   Smoking status: Former    Types: Cigarettes   Smokeless tobacco: Never  Vaping Use   Vaping Use: Never used  Substance and Sexual Activity   Alcohol use: Not Currently   Drug use: Never   Sexual activity: Yes  Other Topics Concern   Not on file  Social History Narrative   Lives with husband; near [place]. Worked Network engineer at [employer]; smoke 1/2 ppd/slowed; no alcohol; 3 children [2 boys; one girl- [employer]]   Social Determinants of Health   Financial Resource Strain: Not on file  Food Insecurity: Not on file  Transportation Needs: Not on file  Physical Activity: Not on file  Stress: Not on file  Social Connections: Not on file    Review of Systems: A 12 point ROS discussed and pertinent positives are indicated in the HPI above.  All other systems are negative.  Review of Systems  Constitutional:  Positive for appetite change and fatigue.  Cardiovascular:  Negative for chest pain and leg swelling.  Gastrointestinal:  Positive for nausea. Negative for abdominal pain, diarrhea and vomiting.  Neurological:  Negative for headaches.   Vital Signs: BP 111/76   Pulse (!) 104   Temp 98.2 F (36.8 C) (Oral)   Resp 18   Ht 5\' 4"  (1.626 m)   Wt 130 lb (59 kg)   SpO2 99%   BMI 22.31 kg/m   Physical Exam Constitutional:      General: She is not in acute distress. HENT:     Mouth/Throat:     Mouth: Mucous membranes are moist.     Pharynx: Oropharynx is clear.  Cardiovascular:     Rate and Rhythm: Regular rhythm. Tachycardia present.  Pulmonary:     Effort: Pulmonary effort is normal.     Breath sounds: Normal breath sounds.  Abdominal:     Comments: Gastrostomy tube, colostomy. Hypoactive bowel sounds.   Musculoskeletal:     Right lower leg: No edema.     Left lower leg: No edema.  Skin:    General: Skin is warm and dry.  Neurological:     Mental Status: She is alert and oriented to person, place, and time.  Psychiatric:        Mood and  Affect: Mood normal.        Behavior: Behavior normal.        Thought Content: Thought content normal.        Judgment: Judgment normal.    Imaging: No results found.  Labs:  CBC: Recent Labs    10/14/20 0438 10/29/20 1615 11/10/20 0905 11/17/20 0901  WBC 5.0 6.4 9.8 6.1  HGB 11.3* 13.8 13.2 12.5  HCT 31.2* 40.2 37.2 37.2  PLT 135* 263 276 215    COAGS: No results for input(s): INR, APTT in the last 8760 hours.  BMP: Recent Labs    10/15/20 0903 10/29/20 1615 11/10/20 0905 11/17/20 0901  NA 135 132* 126* 131*  K 3.6 3.6 4.0 3.3*  CL 100 97* 84* 83*  CO2 25 24 26  38*  GLUCOSE 127* 116* 115* 120*  BUN 10 40* 71*  44*  CALCIUM 7.6* 9.9 9.6 10.4*  CREATININE 0.82 1.72* 2.73* 1.30*  GFRNONAA >60 32* 19* 45*    LIVER FUNCTION TESTS: Recent Labs    10/15/20 0903 10/29/20 1615 11/10/20 0905 11/17/20 0901  BILITOT 0.4 0.8 0.7 0.5  AST 25 22 31  60*  ALT 17 18 27  58*  ALKPHOS 58 66 65 58  PROT 7.3 8.1 8.6* 7.5  ALBUMIN 3.6 3.9 4.2 3.7    TUMOR MARKERS: No results for input(s): AFPTM, CEA, CA199, CHROMGRNA in the last 8760 hours.  Assessment and Plan:  Metastatic endometrial cancer; palliative IV hydration; poor venous access: [name], 65 year old female, presents today to the [facility] Interventional Radiology department for an image-guided port-a-catheter placement.  Risks and benefits of image-guided port-a-catheter placement were discussed with the patient including, but not limited to bleeding, infection, pneumothorax, or fibrin sheath development and need for additional procedures.  All of the patient's questions were answered, patient is agreeable to proceed. She has been NPO.   Consent signed and in chart.  Thank you for this interesting consult.  I greatly enjoyed meeting Larcenia B Rhody and look forward to participating in their care.  A copy of this report was sent to the requesting provider on this  date.  Electronically Signed: Soyla Dryer, AGACNP-BC [phone] 11/21/2020, 10:44 AM   I spent a total of  30 Minutes   in face to face in clinical consultation, greater than 50% of which was counseling/coordinating care for port-a-catheter placement.

## 2020-11-20 NOTE — Progress Notes (Signed)
I spoke to the patient regarding the need for port. She is in agreement. She will keep the appointment as planned for tomorrow.  Follow up as planned.

## 2020-11-20 NOTE — Telephone Encounter (Signed)
Dr. Rogue Bussing has called and spoke to pt and she is ok to cont. The appt to get port

## 2020-11-21 ENCOUNTER — Other Ambulatory Visit: Payer: Self-pay

## 2020-11-21 ENCOUNTER — Encounter: Payer: Self-pay | Admitting: Radiology

## 2020-11-21 ENCOUNTER — Ambulatory Visit
Admission: RE | Admit: 2020-11-21 | Discharge: 2020-11-21 | Disposition: A | Discharge status: Home / Self Care | Payer: Medicare Other | Source: Ambulatory Visit | Attending: Internal Medicine | Admitting: Internal Medicine

## 2020-11-21 ENCOUNTER — Inpatient Hospital Stay: Payer: Medicare Other

## 2020-11-21 DIAGNOSIS — C541 Malignant neoplasm of endometrium: Secondary | ICD-10-CM

## 2020-11-21 DIAGNOSIS — Z452 Encounter for adjustment and management of vascular access device: Secondary | ICD-10-CM | POA: Insufficient documentation

## 2020-11-21 HISTORY — PX: IR IMAGING GUIDED PORT INSERTION: IMG5740

## 2020-11-21 MED ORDER — LIDOCAINE-PRILOCAINE 2.5-2.5 % EX CREA: 1.0000 "application " | TOPICAL_CREAM | CUTANEOUS | 1 refills | Status: AC | PRN

## 2020-11-21 MED ORDER — MIDAZOLAM HCL 2 MG/2ML IJ SOLN
INTRAMUSCULAR | Status: AC
  Filled 2020-11-21: qty 2

## 2020-11-21 MED ORDER — FENTANYL CITRATE (PF) 100 MCG/2ML IJ SOLN
INTRAMUSCULAR | Status: AC | PRN
Start: 2020-11-21 — End: 2020-11-21
  Administered 2020-11-21 (×2): 50 ug via INTRAVENOUS

## 2020-11-21 MED ORDER — MIDAZOLAM HCL 2 MG/2ML IJ SOLN
INTRAMUSCULAR | Status: AC | PRN
  Administered 2020-11-21 (×2): 1 mg via INTRAVENOUS

## 2020-11-21 MED ORDER — HEPARIN SOD (PORK) LOCK FLUSH 100 UNIT/ML IV SOLN
INTRAVENOUS | Status: AC
  Administered 2020-11-21: 500 [IU]
  Filled 2020-11-21: qty 5

## 2020-11-21 MED ORDER — LIDOCAINE-EPINEPHRINE 1 %-1:100000 IJ SOLN
INTRAMUSCULAR | Status: AC
  Administered 2020-11-21: 17 mL
  Filled 2020-11-21: qty 1

## 2020-11-21 MED ORDER — FENTANYL CITRATE (PF) 100 MCG/2ML IJ SOLN
INTRAMUSCULAR | Status: AC
  Filled 2020-11-21: qty 2

## 2020-11-21 MED ORDER — SODIUM CHLORIDE 0.9 % IV SOLN
INTRAVENOUS | Status: DC
  Filled 2020-11-21: qty 1000

## 2020-11-21 MED ORDER — IOHEXOL 350 MG/ML SOLN
10.0000 mL | Freq: Once | INTRAVENOUS | Status: AC | PRN
  Administered 2020-11-21: 10 mL via INTRAVENOUS
  Filled 2020-11-21: qty 10

## 2020-11-21 NOTE — Procedures (Signed)
Pre Procedure Dx: Poor venous access Post Procedural Dx: Same  Successful placement of right IJ approach port-a-cath with tip at the superior caval atrial junction. The catheter is ready for immediate use.  Estimated Blood Loss: Minimal  Complications: None immediate.  Jay Breeona Waid, MD Pager #: [phone]   

## 2020-11-24 ENCOUNTER — Inpatient Hospital Stay (HOSPITAL_BASED_OUTPATIENT_CLINIC_OR_DEPARTMENT_OTHER): Payer: Medicare Other | Admitting: Hospice and Palliative Medicine

## 2020-11-24 ENCOUNTER — Other Ambulatory Visit: Payer: Self-pay

## 2020-11-24 ENCOUNTER — Inpatient Hospital Stay: Payer: Medicare Other

## 2020-11-24 VITALS — BP 125/80 | HR 107 | Temp 96.6°F | Resp 18

## 2020-11-24 DIAGNOSIS — C541 Malignant neoplasm of endometrium: Secondary | ICD-10-CM | POA: Diagnosis not present

## 2020-11-24 DIAGNOSIS — E860 Dehydration: Secondary | ICD-10-CM

## 2020-11-24 DIAGNOSIS — Z5112 Encounter for antineoplastic immunotherapy: Secondary | ICD-10-CM | POA: Diagnosis not present

## 2020-11-24 LAB — CBC WITH DIFFERENTIAL/PLATELET
Abs Immature Granulocytes: 0.03 10*3/uL (ref 0.00–0.07)
Basophils Absolute: 0 10*3/uL (ref 0.0–0.1)
Basophils Relative: 1 %
Eosinophils Absolute: 0 10*3/uL (ref 0.0–0.5)
Eosinophils Relative: 1 %
HCT: 34.8 % — ABNORMAL LOW (ref 36.0–46.0)
Hemoglobin: 11.8 g/dL — ABNORMAL LOW (ref 12.0–15.0)
Immature Granulocytes: 1 %
Lymphocytes Relative: 18 %
Lymphs Abs: 1 10*3/uL (ref 0.7–4.0)
MCH: 35.1 pg — ABNORMAL HIGH (ref 26.0–34.0)
MCHC: 33.9 g/dL (ref 30.0–36.0)
MCV: 103.6 fL — ABNORMAL HIGH (ref 80.0–100.0)
Monocytes Absolute: 0.5 10*3/uL (ref 0.1–1.0)
Monocytes Relative: 10 %
Neutro Abs: 3.7 10*3/uL (ref 1.7–7.7)
Neutrophils Relative %: 69 %
Platelets: 199 10*3/uL (ref 150–400)
RBC: 3.36 MIL/uL — ABNORMAL LOW (ref 3.87–5.11)
RDW: 13.4 % (ref 11.5–15.5)
WBC: 5.3 10*3/uL (ref 4.0–10.5)
nRBC: 0 % (ref 0.0–0.2)

## 2020-11-24 LAB — MAGNESIUM: Magnesium: 1.8 mg/dL (ref 1.7–2.4)

## 2020-11-24 LAB — BASIC METABOLIC PANEL
Anion gap: 8 (ref 5–15)
BUN: 30 mg/dL — ABNORMAL HIGH (ref 8–23)
CO2: 31 mmol/L (ref 22–32)
Calcium: 8.7 mg/dL — ABNORMAL LOW (ref 8.9–10.3)
Chloride: 92 mmol/L — ABNORMAL LOW (ref 98–111)
Creatinine, Ser: 1.16 mg/dL — ABNORMAL HIGH (ref 0.44–1.00)
GFR, Estimated: 52 mL/min — ABNORMAL LOW (ref >60)
Glucose, Bld: 108 mg/dL — ABNORMAL HIGH (ref 70–99)
Potassium: 2.8 mmol/L — ABNORMAL LOW (ref 3.5–5.1)
Sodium: 131 mmol/L — ABNORMAL LOW (ref 135–145)

## 2020-11-24 MED ORDER — ONDANSETRON HCL 4 MG/2ML IJ SOLN
8.0000 mg | Freq: Once | INTRAMUSCULAR | Status: AC
  Administered 2020-11-24: 8 mg via INTRAVENOUS
  Filled 2020-11-24: qty 4

## 2020-11-24 MED ORDER — SODIUM CHLORIDE 0.9% FLUSH
10.0000 mL | Freq: Once | INTRAVENOUS | Status: AC | PRN
  Administered 2020-11-24: 10 mL
  Filled 2020-11-24: qty 10

## 2020-11-24 MED ORDER — HEPARIN SOD (PORK) LOCK FLUSH 100 UNIT/ML IV SOLN
500.0000 [IU] | Freq: Once | INTRAVENOUS | Status: AC | PRN
  Administered 2020-11-24: 500 [IU]
  Filled 2020-11-24: qty 5

## 2020-11-24 MED ORDER — POTASSIUM CHLORIDE 20 MEQ/100ML IV SOLN
20.0000 meq | Freq: Once | INTRAVENOUS | Status: AC
  Administered 2020-11-24: 20 meq via INTRAVENOUS

## 2020-11-24 MED ORDER — POTASSIUM CHLORIDE 20 MEQ/15ML (10%) PO SOLN: 40.0000 meq | Freq: Every day | ORAL | 0 refills | Status: DC

## 2020-11-24 MED ORDER — SODIUM CHLORIDE 0.9 % IV SOLN: Freq: Once | INTRAVENOUS | Status: DC

## 2020-11-24 MED ORDER — SODIUM CHLORIDE 0.9 % IV SOLN
Freq: Once | INTRAVENOUS | Status: AC
  Filled 2020-11-24: qty 250

## 2020-11-24 NOTE — Patient Instructions (Signed)
Potassium Chloride Injection °What is this medication? °POTASSIUM CHLORIDE (poe TASS i um KLOOR ide) prevents and treats low levels of potassium in your body. Potassium plays an important role in maintaining the health of your kidneys, heart, muscles, and nervous system. °This medicine may be used for other purposes; ask your health care provider or pharmacist if you have questions. °COMMON BRAND NAME(S): PROAMP °What should I tell my care team before I take this medication? °They need to know if you have any of these conditions: °Addison disease °Dehydration °Diabetes (high blood sugar) °Heart disease °High levels of potassium in the blood °Irregular heartbeat or rhythm °Kidney disease °Large areas of burned skin °An unusual or allergic reaction to potassium, other medications, foods, dyes, or preservatives °Pregnant or trying to get pregnant °Breast-feeding °How should I use this medication? °This medication is injected into a vein. It is given in a hospital or clinic setting. °Talk to your care team about the use of this medication in children. Special care may be needed. °Overdosage: If you think you have taken too much of this medicine contact a poison control center or emergency room at once. °NOTE: This medicine is only for you. Do not share this medicine with others. °What if I miss a dose? °This does not apply. This medication is not for regular use. °What may interact with this medication? °Do not take this medication with any of the following: °Certain diuretics such as spironolactone, triamterene °Eplerenone °Sodium polystyrene sulfonate °This medication may also interact with the following: °Certain medications for blood pressure or heart disease like lisinopril, losartan, quinapril, valsartan °Medications that lower your chance of fighting infection such as cyclosporine, tacrolimus °NSAIDs, medications for pain and inflammation, like ibuprofen or naproxen °Other potassium supplements °Salt  substitutes °This list may not describe all possible interactions. Give your health care provider a list of all the medicines, herbs, non-prescription drugs, or dietary supplements you use. Also tell them if you smoke, drink alcohol, or use illegal drugs. Some items may interact with your medicine. °What should I watch for while using this medication? °Visit your care team for regular checks on your progress. Tell your care team if your symptoms do not start to get better or if they get worse. °You may need blood work while you are taking this medication. °Avoid salt substitutes unless you are told otherwise by your care team. °What side effects may I notice from receiving this medication? °Side effects that you should report to your care team as soon as possible: °Allergic reactions--skin rash, itching, hives, swelling of the face, lips, tongue, or throat °High potassium level--muscle weakness, fast or irregular heartbeat °Side effects that usually do not require medical attention (report to your care team if they continue or are bothersome): °Diarrhea °Nausea °Stomach pain °Vomiting °This list may not describe all possible side effects. Call your doctor for medical advice about side effects. You may report side effects to FDA at 1-800-FDA-1088. °Where should I keep my medication? °This medication is given in a hospital or clinic. It will not be stored at home. °NOTE: This sheet is a summary. It may not cover all possible information. If you have questions about this medicine, talk to your doctor, pharmacist, or health care provider. °© 2022 [employer] (2020-04-08 00:00:00) ° °

## 2020-11-24 NOTE — Progress Notes (Signed)
Symptom Management [place]  [phone]) [phone] [phone]) [phone]  Patient Care Team: Danelle Berry, NP as PCP - General (Nurse Practitioner) Clent Jacks, RN as Oncology Nurse Navigator Cammie Sickle, MD as Consulting Physician (Internal Medicine) Gillis Ends, MD as Referring Physician (Obstetrics)   Name of the patient: Jeanette Allen  [id number]  12/04/1955   Date of visit: 11/24/20  Reason for Consult:  Jeanette Allen is a 65 y.o. 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  Patient was hospitalized several times in October 2022 at [facility] and [place] for recurrent SBO.  She is not felt to be a surgical candidate and venting G-tube was placed on 10/20/2020 for symptom relief.   Patient saw Dr. Rogue Bussing on 11/17/2020 and was felt to be slightly improved despite continued plaints of intermittent nausea/vomiting and poor oral intake.  Patient received Keytruda cycle #2.  Patient presents for scheduled SMC/labs/possible fluids today.  Patient reports that she is feeling much improved today.  She feels like she is eating and drinking more.  She is having less nausea and requiring utilization of the venting PEG less frequently (generally twice daily).  Output from ileostomy is stable.  Patient denies any other changes or concerns today.  Denies any neurologic complaints. Denies recent fevers or illnesses. Denies any easy bleeding or bruising. Denies chest pain. Denies any vomiting, constipation, or diarrhea. Denies urinary complaints. Patient offers no further specific complaints today.  PAST MEDICAL HISTORY: Past Medical History:  Diagnosis Date   Arthritis    Cancer ([place])    Hypertension     PAST SURGICAL HISTORY:   Past Surgical History:  Procedure Laterality Date   APPENDECTOMY     COLONOSCOPY WITH PROPOFOL N/A 10/01/2019   Procedure: COLONOSCOPY WITH PROPOFOL;  Surgeon: Lin Landsman, MD;  Location: [facility] ENDOSCOPY;  Service: Gastroenterology;  Laterality: N/A;   COLOSTOMY Right 2021   ESOPHAGOGASTRODUODENOSCOPY (EGD) WITH PROPOFOL N/A 09/12/2019   Procedure: ESOPHAGOGASTRODUODENOSCOPY (EGD) WITH PROPOFOL;  Surgeon: Lin Landsman, MD;  Location: [facility];  Service: Gastroenterology;  Laterality: N/A;   GASTROSTOMY TUBE PLACEMENT Left 10/2020   IR IMAGING GUIDED PORT INSERTION  11/21/2020    HEMATOLOGY/ONCOLOGY HISTORY:  Oncology History Overview Note  #August 2021-ascitic fluid cytology-high-grade serous carcinoma of gynecologic origin; CT scan-behind the abdominal wall 4.6 x 2.1 mass ; within the mesentery 6.7 x 4.5 cm left of mid abdomen . BIOPSY of the peritoneal mass; high grade carcinoma ca-[phone] [Dr.Byrnett]; SEP 9th 2021- PET scan-shows peritoneal carcinomatosis; uterine uptake [Dr.Secord; unable to biopsy cervical stenosis] sigmoid colonic uptake concerning for malignancy.  No ovarian uptake.  CEA 125 +1300; CEA-2.    #Hepatitis C/ectopic pregnancy/PRBC transfusion 1995-untreated.[Dr.Vanga]  # 09/14/2019-neoadjuvant CARBO-TAXOL s/p #4 cycles-partial response; DEC 9th 2021-laparoscopy-surgery aborted given the need for multiple bowel resections; DEc 22nd, 2021-proceed with neoadjuvant carbotaxol; STARTING cycle #6- AUC-5 sec to persistent thrombocytopenia.  #Status post #7 CarboTaxol -March 2022-debulking surgery TAH/BSO; bowel resections; ileostomy/mucous fistula.  # April 13th-restart CarboTaxol No. 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 GENETICS- NEG; HRD-    # PALLIATIVE CARE EVALUATION:  # PAIN MANAGEMENT:    DIAGNOSIS: Uterine  cancer  STAGE:   IV      ;  GOALS: control  CURRENT/MOST RECENT THERAPY :  Carbotaxol    Uterine cancer ([place]) (Resolved)  09/04/2019 Initial Diagnosis   Gynecologic cancer [facility])   Endometrial cancer ([place])  09/14/2019 - 06/12/2020 Chemotherapy   Patient is on Treatment Plan : OVARIAN Carboplatin (AUC 6) / Paclitaxel (175) q21d x 6 cycles     10/05/2019 Initial Diagnosis   Endometrial cancer ([facility])    Genetic Testing   Negative germline genetic testing. No pathogenic variants identified on the [facility] panel. The report date is 10/01/2019. HRD testing was ordered but cancelled due to not enough sample/endometrial diagnosis.   The [facility] gene panel offered by [employer] includes sequencing and deletion/duplication testing of the following 35 genes: APC, ATM, AXIN2, BARD1, BMPR1A, BRCA1, BRCA2, BRIP1, CHD1, CDK4, CDKN2A, CHEK2, EPCAM (large rearrangement only), HOXB13, GALNT12, MLH1, MSH2, MSH3, MSH6, MUTYH, NBN, NTHL1, PALB2, PMS2, PTEN, RAD51C, RAD51D, RNF43, RPS20, SMAD4, STK11, and TP53. Sequencing was performed for select regions of POLE and POLD1, and large rearrangement analysis was performed for select regions of GREM1.    01/16/2020 Cancer Staging   Staging form: Corpus Uteri - Carcinoma and Carcinosarcoma, AJCC 8th Edition - Clinical: Stage IVB (cM1) - Signed by Cammie Sickle, MD on 01/16/2020    10/15/2020 -  Chemotherapy   Patient is on Treatment Plan : UTERINE Lenvatinib + Pembrolizumab q21d       ALLERGIES:  has No Known Allergies.  MEDICATIONS:  Current Outpatient Medications  Medication Sig Dispense Refill   acetaminophen (TYLENOL) 500 MG tablet Take 1 tablet (500 mg total) by mouth every 6 (six) hours as needed for mild pain, fever or headache (or Fever >/= 101). 30 tablet 0   diphenhydramine-acetaminophen (TYLENOL PM) 25-500 MG TABS tablet Take 1 tablet by mouth at bedtime as needed. (Patient not taking: No sig reported)     ergocalciferol  (VITAMIN D2) 1.25 MG (50000 UT) capsule Take 50,000 Units by mouth every Monday.     FLUoxetine (PROZAC) 40 MG capsule Take 1 capsule (40 mg total) by mouth daily. 30 capsule 3   lenvatinib 20 mg daily dose (LENVIMA, 20 MG DAILY DOSE,) 2 x 10 MG capsule Take 2 capsules (20 mg total) by mouth daily. 60 capsule 1   lidocaine-prilocaine (EMLA) cream Apply 1 application topically as needed. Apply to port and cover with saran wrap 1-2 hours prior to port access 30 g 1   LORazepam (ATIVAN) 0.5 MG tablet Take 1 tablet (0.5 mg total) by mouth every 8 (eight) hours as needed for anxiety. /nausea. 60 tablet 0   omeprazole (PRILOSEC) 40 MG capsule Take 1 capsule (40 mg total) by mouth 2 (two) times daily before a meal. (Patient not taking: Reported on 11/17/2020) 60 capsule 1   ondansetron (ZOFRAN) 8 MG tablet One pill every 8 hours as needed for nausea/vomitting. 60 tablet 1   ondansetron (ZOFRAN-ODT) 4 MG disintegrating tablet Take 4 mg by mouth every 8 (eight) hours as needed for nausea or vomiting.     prochlorperazine (COMPAZINE) 10 MG tablet Take 1 tablet (10 mg total) by mouth every 6 (six) hours as needed for nausea or vomiting. 40 tablet 1   traZODone (DESYREL) 50 MG tablet Take 1 tablet by mouth at bedtime as needed.     No current facility-administered medications for this visit.   Facility-Administered Medications Ordered in Other Visits  Medication Dose Route Frequency Provider Last Rate Last Admin   0.9 %  sodium chloride infusion   Intravenous Once Cammie Sickle, MD  heparin lock flush 100 unit/mL  500 Units Intracatheter Once PRN Cammie Sickle, MD       ondansetron [facility]) injection 8 mg  8 mg Intravenous Once Charlaine Dalton R, MD       sodium chloride flush (NS) 0.9 % injection 10 mL  10 mL Intracatheter Once PRN Cammie Sickle, MD        VITAL SIGNS: BP 125/80   Pulse (!) 107   Temp (!) 96.6 F (35.9 C) (Tympanic)   Resp 18   SpO2 100%  There were  no vitals filed for this visit.  Estimated body mass index is 22.31 kg/m as calculated from the following:   Height as of 11/21/20: '5\' 4"'  (1.626 m).   Weight as of 11/21/20: 130 lb (59 kg).  LABS: CBC:    Component Value Date/Time   WBC 5.3 11/24/2020 1010   HGB 11.8 (L) 11/24/2020 1010   HCT 34.8 (L) 11/24/2020 1010   PLT 199 11/24/2020 1010   MCV 103.6 (H) 11/24/2020 1010   NEUTROABS 3.7 11/24/2020 1010   LYMPHSABS 1.0 11/24/2020 1010   MONOABS 0.5 11/24/2020 1010   EOSABS 0.0 11/24/2020 1010   BASOSABS 0.0 11/24/2020 1010   Comprehensive Metabolic Panel:    Component Value Date/Time   NA 131 (L) 11/17/2020 0901   K 3.3 (L) 11/17/2020 0901   CL 83 (L) 11/17/2020 0901   CO2 38 (H) 11/17/2020 0901   BUN 44 (H) 11/17/2020 0901   CREATININE 1.30 (H) 11/17/2020 0901   GLUCOSE 120 (H) 11/17/2020 0901   CALCIUM 10.4 (H) 11/17/2020 0901   AST 60 (H) 11/17/2020 0901   ALT 58 (H) 11/17/2020 0901   ALKPHOS 58 11/17/2020 0901   BILITOT 0.5 11/17/2020 0901   PROT 7.5 11/17/2020 0901   ALBUMIN 3.7 11/17/2020 0901    RADIOGRAPHIC STUDIES: IR IMAGING GUIDED PORT INSERTION  Addendum Date: 11/21/2020   ADDENDUM REPORT: 11/21/2020 14:20 ADDENDUM: As there was difficulty advancing the microwire centrally, 10 cc of Omnipaque 300 was administered via the micropuncture sheath demonstrating tortuosity but patency of the central aspect of the right internal jugular vein, facilitating access to the central venous system. Electronically Signed   By: Sandi Mariscal M.D.   On: 11/21/2020 14:20   Result Date: 11/21/2020 INDICATION: History of metastatic endometrial cancer. In need of durable intravenous access for the administration of chemotherapy. EXAM: IMPLANTED PORT A CATH PLACEMENT WITH ULTRASOUND AND FLUOROSCOPIC GUIDANCE COMPARISON:  PET-CT-08/25/2020 MEDICATIONS: None ANESTHESIA/SEDATION: Moderate (conscious) sedation was employed during this procedure as administered by the Interventional  Radiology RN. A total of Versed 2 mg and Fentanyl 100 mcg was administered intravenously. Moderate Sedation Time: 35 minutes. The patient's level of consciousness and vital signs were monitored continuously by radiology nursing throughout the procedure under my direct supervision. CONTRAST:  None FLUOROSCOPY TIME:  7 minutes, 18 seconds (67.2 mGy) COMPLICATIONS: None immediate. PROCEDURE: The procedure, risks, benefits, and alternatives were explained to the patient. Questions regarding the procedure were encouraged and answered. The patient understands and consents to the procedure. The right neck and chest were prepped with chlorhexidine in a sterile fashion, and a sterile drape was applied covering the operative field. Maximum barrier sterile technique with sterile gowns and gloves were used for the procedure. A timeout was performed prior to the initiation of the procedure. Local anesthesia was provided with 1% lidocaine with epinephrine. After creating a small venotomy incision, a micropuncture kit was utilized to access the internal jugular vein. Real-time  ultrasound guidance was utilized for vascular access including the acquisition of a permanent ultrasound image documenting patency of the accessed vessel. The microwire was utilized to measure appropriate catheter length. A subcutaneous port pocket was then created along the upper chest wall utilizing a combination of sharp and blunt dissection. The pocket was irrigated with sterile saline. A single lumen slim sized power injectable port was chosen for placement. The 8 Fr catheter was tunneled from the port pocket site to the venotomy incision. The port was placed in the pocket. The external catheter was trimmed to appropriate length. At the venotomy, an 8 Fr peel-away sheath was placed over a guidewire under fluoroscopic guidance. The catheter was then placed through the sheath and the sheath was removed. Final catheter positioning was confirmed and  documented with a fluoroscopic spot radiograph. The port was accessed with a Huber needle, aspirated and flushed with heparinized saline. The venotomy site was closed with an interrupted 4-0 Vicryl suture. The port pocket incision was closed with interrupted 2-0 Vicryl suture. Dermabond and Steri-strips were applied to both incisions. Dressings were applied. The patient tolerated the procedure well without immediate post procedural complication. FINDINGS: After catheter placement, the tip lies within the superior cavoatrial junction. The catheter aspirates and flushes normally and is ready for immediate use. IMPRESSION: Successful placement of a right internal jugular approach power injectable Port-A-Cath. The catheter is ready for immediate use. Electronically Signed: By: Sandi Mariscal M.D. On: 11/21/2020 13:21    PERFORMANCE STATUS (ECOG) : 2 - Symptomatic, <50% confined to bed  Review of Systems Unless otherwise noted, a complete review of systems is negative.  Physical Exam General: NAD Cardiovascular: regular rate and rhythm Pulmonary: clear anterior/posterior fields Abdomen: soft, nontender, + bowel sounds, PEG in place, ileostomy GU: no suprapubic tenderness Extremities: no edema, no joint deformities Skin: no rashes Neurological: Weakness but otherwise nonfocal     Assessment and Plan- Patient is a 65 y.o. 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  Patient was hospitalized several times in October 2022 at [facility] and [place] for recurrent SBO.  She is not felt to be a surgical candidate and venting G-tube was placed on 10/20/2020 for symptom relief.  Patient presents to [facility] today for follow-up labs/fluids  Hyponatremia -we will proceed with IV fluids today  Hypokalemia -proceed with IV KCl  20 mEq x 2 today.  Check serum magnesium and replete if needed.  Start KCl elixir 40 mEq daily.  Nausea -continue antiemetics and utilization of venting PEG as needed.  Overall, symptoms seem improved.  Venting PEG -placed about 3 weeks ago at [facility].  Sutures/securement devices are still in place.  We will reach out to IR regarding guidance as I suspect sutures need to be removed at this point.  ACP -patient brought in healthcare power of attorney documents and a MOST form detailing a desire for full code/full scope of treatment.  This prompted a conversation regarding prognosis and we discussed the incurable nature of stage IV malignancy.  Both patient and husband became tearful as expressed and expectation that this cancer would be cured.  I encouraged him to speak with medical oncology regarding prognosis.  We will plan to bring patient back later this week for fluids/labs/possible K   Case and plan discussed with Dr. Rogue Bussing   Patient expressed understanding and was in agreement with this  plan. She also understands that She can call clinic at any time with any questions, concerns, or complaints.   Thank you for allowing me to participate in the care of this very pleasant patient.   Time Total: 30 minutes  Visit consisted of counseling and education dealing with the complex and emotionally intense issues of symptom management in the setting of serious illness.Greater than 50%  of this time was spent counseling and coordinating care related to the above assessment and plan.  Signed by: Altha Harm, PhD, NP-C

## 2020-11-26 ENCOUNTER — Other Ambulatory Visit: Payer: Self-pay

## 2020-11-26 ENCOUNTER — Inpatient Hospital Stay: Payer: Medicare Other

## 2020-11-26 VITALS — BP 122/68 | HR 105 | Temp 96.1°F | Resp 18

## 2020-11-26 DIAGNOSIS — Z5112 Encounter for antineoplastic immunotherapy: Secondary | ICD-10-CM | POA: Diagnosis not present

## 2020-11-26 DIAGNOSIS — E860 Dehydration: Secondary | ICD-10-CM

## 2020-11-26 DIAGNOSIS — C541 Malignant neoplasm of endometrium: Secondary | ICD-10-CM

## 2020-11-26 DIAGNOSIS — R110 Nausea: Secondary | ICD-10-CM

## 2020-11-26 LAB — CBC WITH DIFFERENTIAL/PLATELET
Abs Immature Granulocytes: 0.04 10*3/uL (ref 0.00–0.07)
Basophils Absolute: 0 10*3/uL (ref 0.0–0.1)
Basophils Relative: 1 %
Eosinophils Absolute: 0 10*3/uL (ref 0.0–0.5)
Eosinophils Relative: 1 %
HCT: 33.9 % — ABNORMAL LOW (ref 36.0–46.0)
Hemoglobin: 11.3 g/dL — ABNORMAL LOW (ref 12.0–15.0)
Immature Granulocytes: 1 %
Lymphocytes Relative: 13 %
Lymphs Abs: 0.8 10*3/uL (ref 0.7–4.0)
MCH: 34.9 pg — ABNORMAL HIGH (ref 26.0–34.0)
MCHC: 33.3 g/dL (ref 30.0–36.0)
MCV: 104.6 fL — ABNORMAL HIGH (ref 80.0–100.0)
Monocytes Absolute: 0.6 10*3/uL (ref 0.1–1.0)
Monocytes Relative: 10 %
Neutro Abs: 4.3 10*3/uL (ref 1.7–7.7)
Neutrophils Relative %: 74 %
Platelets: 179 10*3/uL (ref 150–400)
RBC: 3.24 MIL/uL — ABNORMAL LOW (ref 3.87–5.11)
RDW: 13.6 % (ref 11.5–15.5)
WBC: 5.8 10*3/uL (ref 4.0–10.5)
nRBC: 0 % (ref 0.0–0.2)

## 2020-11-26 LAB — COMPREHENSIVE METABOLIC PANEL
ALT: 46 U/L — ABNORMAL HIGH (ref 0–44)
AST: 51 U/L — ABNORMAL HIGH (ref 15–41)
Albumin: 3.2 g/dL — ABNORMAL LOW (ref 3.5–5.0)
Alkaline Phosphatase: 52 U/L (ref 38–126)
Anion gap: 10 (ref 5–15)
BUN: 25 mg/dL — ABNORMAL HIGH (ref 8–23)
CO2: 32 mmol/L (ref 22–32)
Calcium: 9 mg/dL (ref 8.9–10.3)
Chloride: 92 mmol/L — ABNORMAL LOW (ref 98–111)
Creatinine, Ser: 1.12 mg/dL — ABNORMAL HIGH (ref 0.44–1.00)
GFR, Estimated: 54 mL/min — ABNORMAL LOW (ref >60)
Glucose, Bld: 113 mg/dL — ABNORMAL HIGH (ref 70–99)
Potassium: 3 mmol/L — ABNORMAL LOW (ref 3.5–5.1)
Sodium: 134 mmol/L — ABNORMAL LOW (ref 135–145)
Total Bilirubin: 0.5 mg/dL (ref 0.3–1.2)
Total Protein: 6.9 g/dL (ref 6.5–8.1)

## 2020-11-26 MED ORDER — DEXAMETHASONE SODIUM PHOSPHATE 10 MG/ML IJ SOLN
10.0000 mg | Freq: Once | INTRAMUSCULAR | Status: AC
  Administered 2020-11-26: 10 mg via INTRAVENOUS
  Filled 2020-11-26: qty 1

## 2020-11-26 MED ORDER — ONDANSETRON HCL 4 MG/2ML IJ SOLN
8.0000 mg | Freq: Once | INTRAMUSCULAR | Status: AC
  Administered 2020-11-26: 8 mg via INTRAVENOUS
  Filled 2020-11-26: qty 4

## 2020-11-26 MED ORDER — POTASSIUM CHLORIDE 20 MEQ/100ML IV SOLN
20.0000 meq | Freq: Once | INTRAVENOUS | Status: AC
  Administered 2020-11-26: 20 meq via INTRAVENOUS

## 2020-11-26 MED ORDER — SODIUM CHLORIDE 0.9 % IV SOLN
Freq: Once | INTRAVENOUS | Status: AC
  Filled 2020-11-26: qty 250

## 2020-11-26 MED ORDER — SODIUM CHLORIDE 0.9 % IV SOLN
INTRAVENOUS | Status: DC
  Filled 2020-11-26: qty 250

## 2020-11-26 MED ORDER — SODIUM CHLORIDE 0.9 % IV SOLN: 10.0000 mg | Freq: Once | INTRAVENOUS | Status: DC

## 2020-11-26 MED ORDER — SODIUM CHLORIDE 0.9 % IV SOLN: Freq: Once | INTRAVENOUS | Status: DC

## 2020-11-26 NOTE — Patient Instructions (Signed)
[facility] ONCOL[facility]  Discharge Instructions: Thank you for choosing [facility] to provide your oncology and hematology care.  If you have a lab appointment with the [facility], please go directly to the [facility] and check in at the registration area.  Wear comfortable clothing and clothing appropriate for easy access to any Portacath or PICC line.   We strive to give you quality time with your provider. You may need to reschedule your appointment if you arrive late (15 or more minutes).  Arriving late affects you and other patients whose appointments are after yours.  Also, if you miss three or more appointments without notifying the office, you may be dismissed from the clinic at the provider's discretion.      For prescription refill requests, have your pharmacy contact our office and allow 72 hours for refills to be completed.    Today you received the following : Hydration / Potassium   To help prevent nausea and vomiting after your treatment, we encourage you to take your nausea medication as directed.  BELOW ARE SYMPTOMS THAT SHOULD BE REPORTED IMMEDIATELY: *FEVER GREATER THAN 100.4 F (38 C) OR HIGHER *CHILLS OR SWEATING *NAUSEA AND VOMITING THAT IS NOT CONTROLLED WITH YOUR NAUSEA MEDICATION *UNUSUAL SHORTNESS OF BREATH *UNUSUAL BRUISING OR BLEEDING *URINARY PROBLEMS (pain or burning when urinating, or frequent urination) *BOWEL PROBLEMS (unusual diarrhea, constipation, pain near the anus) TENDERNESS IN MOUTH AND THROAT WITH OR WITHOUT PRESENCE OF ULCERS (sore throat, sores in mouth, or a toothache) UNUSUAL RASH, SWELLING OR PAIN  UNUSUAL VAGINAL DISCHARGE OR ITCHING   Items with * indicate a potential emergency and should be followed up as soon as possible or go to the Emergency Department if any problems should occur.  Please show the CHEMOTHERAPY ALERT CARD or IMMUNOTHERAPY ALERT CARD at check-in to the Emergency Department and  triage nurse.  Should you have questions after your visit or need to cancel or reschedule your appointment, please contact [facility]  [phone] and follow the prompts.  Office hours are 8:00 a.m. to 4:30 p.m. Monday - Friday. Please note that voicemails left after 4:00 p.m. may not be returned until the following business day.  We are closed weekends and major holidays. You have access to a nurse at all times for urgent questions. Please call the main number to the clinic [phone] and follow the prompts.  For any non-urgent questions, you may also contact your provider using MyChart. We now offer e-Visits for anyone 66 and older to request care online for non-urgent symptoms. For details visit [email or web address].   Also download the MyChart app! Go to the app store, search "MyChart", open the app, select [facility], and log in with your MyChart username and password.  Due to Covid, a mask is required upon entering the hospital/clinic. If you do not have a mask, one will be given to you upon arrival. For doctor visits, patients may have 1 support person aged 51 or older with them. For treatment visits, patients cannot have anyone with them due to current Covid guidelines and our immunocompromised population.

## 2020-11-28 ENCOUNTER — Other Ambulatory Visit: Payer: Self-pay | Admitting: Internal Medicine

## 2020-12-01 ENCOUNTER — Encounter: Payer: Self-pay | Admitting: Internal Medicine

## 2020-12-01 ENCOUNTER — Other Ambulatory Visit: Payer: Self-pay | Admitting: *Deleted

## 2020-12-01 MED ORDER — LORAZEPAM 0.5 MG PO TABS: 0.5000 mg | ORAL_TABLET | Freq: Three times a day (TID) | ORAL | 0 refills | Status: AC | PRN

## 2020-12-02 ENCOUNTER — Other Ambulatory Visit: Payer: Self-pay

## 2020-12-02 ENCOUNTER — Inpatient Hospital Stay: Payer: Medicare Other

## 2020-12-02 ENCOUNTER — Inpatient Hospital Stay (HOSPITAL_BASED_OUTPATIENT_CLINIC_OR_DEPARTMENT_OTHER): Payer: Medicare Other | Admitting: Hospice and Palliative Medicine

## 2020-12-02 VITALS — BP 124/78 | HR 90 | Temp 96.0°F | Resp 18

## 2020-12-02 DIAGNOSIS — C541 Malignant neoplasm of endometrium: Secondary | ICD-10-CM

## 2020-12-02 DIAGNOSIS — Z5112 Encounter for antineoplastic immunotherapy: Secondary | ICD-10-CM | POA: Diagnosis not present

## 2020-12-02 DIAGNOSIS — R110 Nausea: Secondary | ICD-10-CM

## 2020-12-02 DIAGNOSIS — E860 Dehydration: Secondary | ICD-10-CM

## 2020-12-02 DIAGNOSIS — E876 Hypokalemia: Secondary | ICD-10-CM

## 2020-12-02 LAB — COMPREHENSIVE METABOLIC PANEL
ALT: 39 U/L (ref 0–44)
AST: 47 U/L — ABNORMAL HIGH (ref 15–41)
Albumin: 3.5 g/dL (ref 3.5–5.0)
Alkaline Phosphatase: 52 U/L (ref 38–126)
Anion gap: 17 — ABNORMAL HIGH (ref 5–15)
BUN: 25 mg/dL — ABNORMAL HIGH (ref 8–23)
CO2: 30 mmol/L (ref 22–32)
Calcium: 9.8 mg/dL (ref 8.9–10.3)
Chloride: 86 mmol/L — ABNORMAL LOW (ref 98–111)
Creatinine, Ser: 1.04 mg/dL — ABNORMAL HIGH (ref 0.44–1.00)
GFR, Estimated: 59 mL/min — ABNORMAL LOW (ref >60)
Glucose, Bld: 100 mg/dL — ABNORMAL HIGH (ref 70–99)
Potassium: 2.7 mmol/L — CL (ref 3.5–5.1)
Sodium: 133 mmol/L — ABNORMAL LOW (ref 135–145)
Total Bilirubin: 1.1 mg/dL (ref 0.3–1.2)
Total Protein: 6.7 g/dL (ref 6.5–8.1)

## 2020-12-02 LAB — CBC WITH DIFFERENTIAL/PLATELET
Abs Immature Granulocytes: 0.04 10*3/uL (ref 0.00–0.07)
Basophils Absolute: 0 10*3/uL (ref 0.0–0.1)
Basophils Relative: 0 %
Eosinophils Absolute: 0 10*3/uL (ref 0.0–0.5)
Eosinophils Relative: 0 %
HCT: 34.4 % — ABNORMAL LOW (ref 36.0–46.0)
Hemoglobin: 11.4 g/dL — ABNORMAL LOW (ref 12.0–15.0)
Immature Granulocytes: 1 %
Lymphocytes Relative: 14 %
Lymphs Abs: 0.9 10*3/uL (ref 0.7–4.0)
MCH: 34.8 pg — ABNORMAL HIGH (ref 26.0–34.0)
MCHC: 33.1 g/dL (ref 30.0–36.0)
MCV: 104.9 fL — ABNORMAL HIGH (ref 80.0–100.0)
Monocytes Absolute: 0.6 10*3/uL (ref 0.1–1.0)
Monocytes Relative: 9 %
Neutro Abs: 4.8 10*3/uL (ref 1.7–7.7)
Neutrophils Relative %: 76 %
Platelets: 190 10*3/uL (ref 150–400)
RBC: 3.28 MIL/uL — ABNORMAL LOW (ref 3.87–5.11)
RDW: 14 % (ref 11.5–15.5)
WBC: 6.4 10*3/uL (ref 4.0–10.5)
nRBC: 0 % (ref 0.0–0.2)

## 2020-12-02 LAB — MAGNESIUM: Magnesium: 2 mg/dL (ref 1.7–2.4)

## 2020-12-02 MED ORDER — POTASSIUM CHLORIDE 20 MEQ/100ML IV SOLN
20.0000 meq | Freq: Once | INTRAVENOUS | Status: AC
  Administered 2020-12-02: 20 meq via INTRAVENOUS

## 2020-12-02 MED ORDER — OLANZAPINE 10 MG PO TABS: 10.0000 mg | ORAL_TABLET | Freq: Every day | ORAL | 2 refills | Status: AC

## 2020-12-02 MED ORDER — ONDANSETRON HCL 4 MG/2ML IJ SOLN
8.0000 mg | Freq: Once | INTRAMUSCULAR | Status: AC
  Administered 2020-12-02: 8 mg via INTRAVENOUS

## 2020-12-02 MED ORDER — SODIUM CHLORIDE 0.9 % IV SOLN
Freq: Once | INTRAVENOUS | Status: AC
  Filled 2020-12-02: qty 250

## 2020-12-02 MED ORDER — SODIUM CHLORIDE 0.9 % IV SOLN: 8.0000 mg | Freq: Once | INTRAVENOUS | Status: DC

## 2020-12-02 NOTE — Patient Instructions (Signed)
Potassium Chloride Injection °What is this medication? °POTASSIUM CHLORIDE (poe TASS i um KLOOR ide) prevents and treats low levels of potassium in your body. Potassium plays an important role in maintaining the health of your kidneys, heart, muscles, and nervous system. °This medicine may be used for other purposes; ask your health care provider or pharmacist if you have questions. °COMMON BRAND NAME(S): PROAMP °What should I tell my care team before I take this medication? °They need to know if you have any of these conditions: °Addison disease °Dehydration °Diabetes (high blood sugar) °Heart disease °High levels of potassium in the blood °Irregular heartbeat or rhythm °Kidney disease °Large areas of burned skin °An unusual or allergic reaction to potassium, other medications, foods, dyes, or preservatives °Pregnant or trying to get pregnant °Breast-feeding °How should I use this medication? °This medication is injected into a vein. It is given in a hospital or clinic setting. °Talk to your care team about the use of this medication in children. Special care may be needed. °Overdosage: If you think you have taken too much of this medicine contact a poison control center or emergency room at once. °NOTE: This medicine is only for you. Do not share this medicine with others. °What if I miss a dose? °This does not apply. This medication is not for regular use. °What may interact with this medication? °Do not take this medication with any of the following: °Certain diuretics such as spironolactone, triamterene °Eplerenone °Sodium polystyrene sulfonate °This medication may also interact with the following: °Certain medications for blood pressure or heart disease like lisinopril, losartan, quinapril, valsartan °Medications that lower your chance of fighting infection such as cyclosporine, tacrolimus °NSAIDs, medications for pain and inflammation, like ibuprofen or naproxen °Other potassium supplements °Salt  substitutes °This list may not describe all possible interactions. Give your health care provider a list of all the medicines, herbs, non-prescription drugs, or dietary supplements you use. Also tell them if you smoke, drink alcohol, or use illegal drugs. Some items may interact with your medicine. °What should I watch for while using this medication? °Visit your care team for regular checks on your progress. Tell your care team if your symptoms do not start to get better or if they get worse. °You may need blood work while you are taking this medication. °Avoid salt substitutes unless you are told otherwise by your care team. °What side effects may I notice from receiving this medication? °Side effects that you should report to your care team as soon as possible: °Allergic reactions--skin rash, itching, hives, swelling of the face, lips, tongue, or throat °High potassium level--muscle weakness, fast or irregular heartbeat °Side effects that usually do not require medical attention (report to your care team if they continue or are bothersome): °Diarrhea °Nausea °Stomach pain °Vomiting °This list may not describe all possible side effects. Call your doctor for medical advice about side effects. You may report side effects to FDA at 1-800-FDA-1088. °Where should I keep my medication? °This medication is given in a hospital or clinic. It will not be stored at home. °NOTE: This sheet is a summary. It may not cover all possible information. If you have questions about this medicine, talk to your doctor, pharmacist, or health care provider. °© 2022 [employer] (2020-04-08 00:00:00) ° °

## 2020-12-02 NOTE — Progress Notes (Signed)
[place]  [phone] [phone]   Name: Jeanette Allen Date: 12/02/2020 MRN: [id number]  DOB: [birth year]  Patient Care Team: Danelle Berry, NP as PCP - General (Nurse Practitioner) Clent Jacks, RN as Oncology Nurse Navigator Cammie Sickle, MD as Consulting Physician (Internal Medicine) Gillis Ends, MD as Referring Physician (Obstetrics)    REASON FOR CONSULTATION: Jeanette Allen is a 65 y.o. 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  Patient was hospitalized several times in October 2022 at [facility] and [facility] for recurrent SBO.  She is not felt to be a surgical candidate and venting G-tube was placed on 10/20/2020 for symptom relief.  Palliative care was consulted help address goals.  SOCIAL HISTORY:     reports that she has quit smoking. Her smoking use included cigarettes. She has never used smokeless tobacco. She reports that she does not currently use alcohol. She reports that she does not use drugs.  Patient is married and lives at home with her husband.  She was her husband's primary caregiver as he is an amputee.  She has a daughter who is involved.  Patient also has a son who lives nearby and another son in [place].  Patient formally worked in a [employer].  ADVANCE DIRECTIVES:  Not on file  CODE STATUS:   PAST MEDICAL HISTORY: Past Medical History:  Diagnosis Date   Arthritis    Cancer ([place])    Hypertension     PAST SURGICAL HISTORY:  Past Surgical History:  Procedure Laterality Date   APPENDECTOMY     COLONOSCOPY WITH PROPOFOL N/A 10/01/2019   Procedure: COLONOSCOPY WITH PROPOFOL;  Surgeon: Lin Landsman, MD;  Location: [facility] ENDOSCOPY;  Service:  Gastroenterology;  Laterality: N/A;   COLOSTOMY Right 2021   ESOPHAGOGASTRODUODENOSCOPY (EGD) WITH PROPOFOL N/A 09/12/2019   Procedure: ESOPHAGOGASTRODUODENOSCOPY (EGD) WITH PROPOFOL;  Surgeon: Lin Landsman, MD;  Location: [facility];  Service: Gastroenterology;  Laterality: N/A;   GASTROSTOMY TUBE PLACEMENT Left 10/2020   IR IMAGING GUIDED PORT INSERTION  11/21/2020    HEMATOLOGY/ONCOLOGY HISTORY:  Oncology History Overview Note  #August 2021-ascitic fluid cytology-high-grade serous carcinoma of gynecologic origin; CT scan-behind the abdominal wall 4.6 x 2.1 mass ; within the mesentery 6.7 x 4.5 cm left of mid abdomen . BIOPSY of the peritoneal mass; high grade carcinoma [phone] [Dr.Byrnett]; SEP 9th 2021- PET scan-shows peritoneal carcinomatosis; uterine uptake [Dr.Secord; unable to biopsy cervical stenosis] sigmoid colonic uptake concerning for malignancy.  No ovarian uptake.  CEA 125 +1300; CEA-2.    #Hepatitis C/ectopic pregnancy/PRBC transfusion 1995-untreated.[Dr.Vanga]  # 09/14/2019-neoadjuvant CARBO-TAXOL s/p #4 cycles-partial response; DEC 9th 2021-laparoscopy-surgery aborted given the need for multiple bowel resections; DEc 22nd, 2021-proceed with neoadjuvant carbotaxol; STARTING cycle #6- AUC-5 sec to persistent thrombocytopenia.  #Status post #7 CarboTaxol -March 2022-debulking surgery TAH/BSO; bowel resections; ileostomy/mucous fistula.  # April 13th-restart CarboTaxol No. 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 GENETICS- NEG; HRD-    # PALLIATIVE CARE EVALUATION:  # PAIN MANAGEMENT:    DIAGNOSIS: Uterine cancer  STAGE:   IV      ;  GOALS: control  CURRENT/MOST RECENT THERAPY : Carbotaxol    Uterine cancer ([place]) (Resolved)  09/04/2019 Initial Diagnosis   Gynecologic cancer (  [place])   Endometrial cancer ([place])  09/14/2019 - 06/12/2020  Chemotherapy   Patient is on Treatment Plan : OVARIAN Carboplatin (AUC 6) / Paclitaxel (175) q21d x 6 cycles     10/05/2019 Initial Diagnosis   Endometrial cancer ([facility])    Genetic Testing   Negative germline genetic testing. No pathogenic variants identified on the [facility] panel. The report date is 10/01/2019. HRD testing was ordered but cancelled due to not enough sample/endometrial diagnosis.   The [facility] gene panel offered by [employer] includes sequencing and deletion/duplication testing of the following 35 genes: APC, ATM, AXIN2, BARD1, BMPR1A, BRCA1, BRCA2, BRIP1, CHD1, CDK4, CDKN2A, CHEK2, EPCAM (large rearrangement only), HOXB13, GALNT12, MLH1, MSH2, MSH3, MSH6, MUTYH, NBN, NTHL1, PALB2, PMS2, PTEN, RAD51C, RAD51D, RNF43, RPS20, SMAD4, STK11, and TP53. Sequencing was performed for select regions of POLE and POLD1, and large rearrangement analysis was performed for select regions of GREM1.    01/16/2020 Cancer Staging   Staging form: Corpus Uteri - Carcinoma and Carcinosarcoma, AJCC 8th Edition - Clinical: Stage IVB (cM1) - Signed by Cammie Sickle, MD on 01/16/2020   10/15/2020 -  Chemotherapy   Patient is on Treatment Plan : UTERINE Lenvatinib + Pembrolizumab q21d       ALLERGIES:  has No Known Allergies.  MEDICATIONS:  Current Outpatient Medications  Medication Sig Dispense Refill   acetaminophen (TYLENOL) 500 MG tablet Take 1 tablet (500 mg total) by mouth every 6 (six) hours as needed for mild pain, fever or headache (or Fever >/= 101). 30 tablet 0   diphenhydramine-acetaminophen (TYLENOL PM) 25-500 MG TABS tablet Take 1 tablet by mouth at bedtime as needed. (Patient not taking: No sig reported)     ergocalciferol (VITAMIN D2) 1.25 MG (50000 UT) capsule Take 50,000 Units by mouth every Monday.     FLUoxetine (PROZAC) 40 MG capsule TAKE 1 CAPSULE  (40 MG TOTAL ) BY MOUTH DAILY 30 capsule 3   lenvatinib 20 mg daily dose (LENVIMA, 20 MG DAILY DOSE,) 2  x 10 MG capsule Take 2 capsules (20 mg total) by mouth daily. 60 capsule 1   lidocaine-prilocaine (EMLA) cream Apply 1 application topically as needed. Apply to port and cover with saran wrap 1-2 hours prior to port access 30 g 1   LORazepam (ATIVAN) 0.5 MG tablet Take 1 tablet (0.5 mg total) by mouth every 8 (eight) hours as needed for anxiety. /nausea. 60 tablet 0   omeprazole (PRILOSEC) 40 MG capsule Take 1 capsule (40 mg total) by mouth 2 (two) times daily before a meal. (Patient not taking: Reported on 11/17/2020) 60 capsule 1   ondansetron (ZOFRAN) 8 MG tablet One pill every 8 hours as needed for nausea/vomitting. 60 tablet 1   ondansetron (ZOFRAN-ODT) 4 MG disintegrating tablet Take 4 mg by mouth every 8 (eight) hours as needed for nausea or vomiting.     potassium chloride 20 MEQ/15ML (10%) SOLN Take 30 mLs (40 mEq total) by mouth daily. 473 mL 0   prochlorperazine (COMPAZINE) 10 MG tablet Take 1 tablet (10 mg total) by mouth every 6 (six) hours as needed for nausea or vomiting. 40 tablet 1   traZODone (DESYREL) 50 MG tablet Take 1 tablet by mouth at bedtime as needed.     No current facility-administered medications for this visit.   Facility-Administered Medications Ordered in Other Visits  Medication Dose Route Frequency Provider Last Rate Last Admin   potassium chloride 20 mEq in 100 mL IVPB  20 mEq Intravenous  Once Kiosha Buchan, Kirt Boys, NP 100 mL/hr at 12/02/20 1227 20 mEq at 12/02/20 1227    VITAL SIGNS: There were no vitals taken for this visit. There were no vitals filed for this visit.  Estimated body mass index is 22.31 kg/m as calculated from the following:   Height as of 11/21/20: '5\' 4"'  (1.626 m).   Weight as of 11/21/20: 130 lb (59 kg).  LABS: CBC:    Component Value Date/Time   WBC 6.4 12/02/2020 1029   HGB 11.4 (L) 12/02/2020 1029   HCT 34.4 (L) 12/02/2020 1029   PLT 190 12/02/2020 1029   MCV 104.9 (H) 12/02/2020 1029   NEUTROABS 4.8 12/02/2020 1029   LYMPHSABS  0.9 12/02/2020 1029   MONOABS 0.6 12/02/2020 1029   EOSABS 0.0 12/02/2020 1029   BASOSABS 0.0 12/02/2020 1029   Comprehensive Metabolic Panel:    Component Value Date/Time   NA 133 (L) 12/02/2020 1029   K 2.7 (LL) 12/02/2020 1029   CL 86 (L) 12/02/2020 1029   CO2 30 12/02/2020 1029   BUN 25 (H) 12/02/2020 1029   CREATININE 1.04 (H) 12/02/2020 1029   GLUCOSE 100 (H) 12/02/2020 1029   CALCIUM 9.8 12/02/2020 1029   AST 47 (H) 12/02/2020 1029   ALT 39 12/02/2020 1029   ALKPHOS 52 12/02/2020 1029   BILITOT 1.1 12/02/2020 1029   PROT 6.7 12/02/2020 1029   ALBUMIN 3.5 12/02/2020 1029    RADIOGRAPHIC STUDIES: IR IMAGING GUIDED PORT INSERTION  Addendum Date: 11/21/2020   ADDENDUM REPORT: 11/21/2020 14:20 ADDENDUM: As there was difficulty advancing the microwire centrally, 10 cc of Omnipaque 300 was administered via the micropuncture sheath demonstrating tortuosity but patency of the central aspect of the right internal jugular vein, facilitating access to the central venous system. Electronically Signed   By: Sandi Mariscal M.D.   On: 11/21/2020 14:20   Result Date: 11/21/2020 INDICATION: History of metastatic endometrial cancer. In need of durable intravenous access for the administration of chemotherapy. EXAM: IMPLANTED PORT A CATH PLACEMENT WITH ULTRASOUND AND FLUOROSCOPIC GUIDANCE COMPARISON:  PET-CT-08/25/2020 MEDICATIONS: None ANESTHESIA/SEDATION: Moderate (conscious) sedation was employed during this procedure as administered by the Interventional Radiology RN. A total of Versed 2 mg and Fentanyl 100 mcg was administered intravenously. Moderate Sedation Time: 35 minutes. The patient's level of consciousness and vital signs were monitored continuously by radiology nursing throughout the procedure under my direct supervision. CONTRAST:  None FLUOROSCOPY TIME:  7 minutes, 18 seconds (75.1 mGy) COMPLICATIONS: None immediate. PROCEDURE: The procedure, risks, benefits, and alternatives were  explained to the patient. Questions regarding the procedure were encouraged and answered. The patient understands and consents to the procedure. The right neck and chest were prepped with chlorhexidine in a sterile fashion, and a sterile drape was applied covering the operative field. Maximum barrier sterile technique with sterile gowns and gloves were used for the procedure. A timeout was performed prior to the initiation of the procedure. Local anesthesia was provided with 1% lidocaine with epinephrine. After creating a small venotomy incision, a micropuncture kit was utilized to access the internal jugular vein. Real-time ultrasound guidance was utilized for vascular access including the acquisition of a permanent ultrasound image documenting patency of the accessed vessel. The microwire was utilized to measure appropriate catheter length. A subcutaneous port pocket was then created along the upper chest wall utilizing a combination of sharp and blunt dissection. The pocket was irrigated with sterile saline. A single lumen slim sized power injectable port was chosen for placement.  The 8 Fr catheter was tunneled from the port pocket site to the venotomy incision. The port was placed in the pocket. The external catheter was trimmed to appropriate length. At the venotomy, an 8 Fr peel-away sheath was placed over a guidewire under fluoroscopic guidance. The catheter was then placed through the sheath and the sheath was removed. Final catheter positioning was confirmed and documented with a fluoroscopic spot radiograph. The port was accessed with a Huber needle, aspirated and flushed with heparinized saline. The venotomy site was closed with an interrupted 4-0 Vicryl suture. The port pocket incision was closed with interrupted 2-0 Vicryl suture. Dermabond and Steri-strips were applied to both incisions. Dressings were applied. The patient tolerated the procedure well without immediate post procedural complication.  FINDINGS: After catheter placement, the tip lies within the superior cavoatrial junction. The catheter aspirates and flushes normally and is ready for immediate use. IMPRESSION: Successful placement of a right internal jugular approach power injectable Port-A-Cath. The catheter is ready for immediate use. Electronically Signed: By: Sandi Mariscal M.D. On: 11/21/2020 13:21    PERFORMANCE STATUS (ECOG) : 2 - Symptomatic, <50% confined to bed  Review of Systems Unless otherwise noted, a complete review of systems is negative.  Physical Exam General: NAD Cardiovascular: regular rate and rhythm Pulmonary: clear ant fields Abdomen: Ostomy with output, venting PEG noted, abdomen soft and nontender GU: no suprapubic tenderness Extremities: no edema, no joint deformities Skin: no rashes Neurological: Weakness but otherwise nonfocal  IMPRESSION: Patient was an add-on to my clinic schedule today at the request of Joli.  Patient seen with her husband.  Patient reports several days of persistent nausea with dry heaves, worse at night.  She has minimal oral intake.  Last appreciable food intake was yesterday when she ate part of an egg.  She says she is not taking the nausea medication routinely.  She was started on dronabinol by her PCP and says that helps some but is short-lived.  Patient also endorses reduced ostomy output.  She is utilizing her venting PEG several times a day and that helps some.  Patient has not been using her oral KCl.  Discussed with patient and husband my concern that the symptoms are likely related to end-stage cancer/recurrent malignant bowel obstruction.  Will speak with Dr. Rogue Bussing about option for imaging.   Overall, she appears to be doing poorly.  She was not able to tolerate Lenvima and is only on Keytruda, which is felt to have a low chance of response.  I spoke with patient today about the option of comfort, as much of her care at this point is palliation of her  symptoms.  She may be appropriate for hospice care in the event of further decline.  Today, we will proceed with IV fluids and replete potassium.  We will give her IV antiemetics and start her on olanzapine at bedtime.  Recommend scheduled ondansetron.  We will bring patient back to clinic tomorrow for further evaluation.  PLAN: -Continue current scope of treatment -IV fluids/KCl today -Start olanzapine 10 mg at bedtime -Recommend scheduled ondansetron -Patient RTC tomorrow for fluids/K   Patient expressed understanding and was in agreement with this plan. She also understands that She can call the clinic at any time with any questions, concerns, or complaints.     Time Total: 35 minutes  Visit consisted of counseling and education dealing with the complex and emotionally intense issues of symptom management and palliative care in the setting of serious and  potentially life-threatening illness.Greater than 50%  of this time was spent counseling and coordinating care related to the above assessment and plan.  Signed by: Altha Harm, PhD, NP-C

## 2020-12-02 NOTE — Progress Notes (Signed)
Jonnie Finner RN removed button sutures from around the venting G Tube. Area around tube is red from irritation, no signs of infection. Instructed pt to get desitin diaper rash cream and apply around the G tube. Received anti emetic and IVFs. Pt will return to clinic tomm for IVFs and possible potassium. Port needle will remain intact overnight.

## 2020-12-02 NOTE — Progress Notes (Signed)
Nutrition Assessment   Reason for Assessment:   Referral from [place], NP, poor po intake, SBO   ASSESSMENT:  65 year old female with stage IV high grade serous endometrial cancer and peritoneal carcinomatosis.  Patient s/p neoadjuvant chemotherapy, debulking surgery, hysterectomy/BSO, left hemicolectomy with ileostomy.  Venting G-tube placed at [place] n 10/20/20.  Palliative care following.   Met with patient and husband in clinic.  Patient reports that she has no appetite or desire to eat.  States that she has nausea all during the day and night. Has dry heaves.  Reports that this has worsened in the last 2 weeks.  Reports that she has a "little bit" of output in ileostomy bag, less in the last 2 weeks.  Patient is venting G-tube about 3 times per day.  Says she tried to eat toast this am and just swelled up in her mouth.  Ate 1/2 egg, and drank 2 ensure shakes yesterday.  Patient and husband tearful during visit.  Patient also states that she fell yesterday, not to the floor but in to a recliner and twisted ankle.     Medications: vit d, ativan, prilosec, compazine, zofran   Labs: reviewed   Anthropometrics:   Height: 64 inches Weight: 125 lb 2 oz today 129 lb 6.4 oz on 11/7 150 lb 3.2 oz on 10/3 153 lb 6.4 oz on 9/9 BMI: 22  18% weight loss in the last 2 months, significant   NUTRITION DIAGNOSIS: Inadequate oral intake related to cancer, small bowel obstruction as evidenced by 18% weight loss, poor po intake    INTERVENTION:  Spoke with Josh, NP and patient will be seen in [facility] today Discussed diet for small bowel obstruction. Handout from [facility] oncology practice group given.   Encouraged high calorie oral nutrition supplement if able to tolerate. Samples of ensure complete and Dillard [place] 1.4 given to patient today to try.  Contact information provided.     Next Visit: as needed  Miguelangel Korn B. Zenia Resides, [place], [place] Registered Dietitian [phone] (mobile)

## 2020-12-03 ENCOUNTER — Inpatient Hospital Stay (HOSPITAL_BASED_OUTPATIENT_CLINIC_OR_DEPARTMENT_OTHER): Payer: Medicare Other | Admitting: Hospice and Palliative Medicine

## 2020-12-03 ENCOUNTER — Encounter: Payer: Self-pay | Admitting: Hospice and Palliative Medicine

## 2020-12-03 ENCOUNTER — Inpatient Hospital Stay: Payer: Medicare Other

## 2020-12-03 VITALS — BP 102/68 | HR 114 | Temp 96.3°F

## 2020-12-03 DIAGNOSIS — C541 Malignant neoplasm of endometrium: Secondary | ICD-10-CM

## 2020-12-03 DIAGNOSIS — E876 Hypokalemia: Secondary | ICD-10-CM

## 2020-12-03 DIAGNOSIS — R110 Nausea: Secondary | ICD-10-CM

## 2020-12-03 DIAGNOSIS — C786 Secondary malignant neoplasm of retroperitoneum and peritoneum: Secondary | ICD-10-CM | POA: Diagnosis not present

## 2020-12-03 DIAGNOSIS — E871 Hypo-osmolality and hyponatremia: Secondary | ICD-10-CM

## 2020-12-03 DIAGNOSIS — E860 Dehydration: Secondary | ICD-10-CM

## 2020-12-03 DIAGNOSIS — Z5112 Encounter for antineoplastic immunotherapy: Secondary | ICD-10-CM | POA: Diagnosis not present

## 2020-12-03 LAB — BASIC METABOLIC PANEL
Anion gap: 7 (ref 5–15)
BUN: 23 mg/dL (ref 8–23)
CO2: 32 mmol/L (ref 22–32)
Calcium: 9 mg/dL (ref 8.9–10.3)
Chloride: 93 mmol/L — ABNORMAL LOW (ref 98–111)
Creatinine, Ser: 1.06 mg/dL — ABNORMAL HIGH (ref 0.44–1.00)
GFR, Estimated: 58 mL/min — ABNORMAL LOW (ref >60)
Glucose, Bld: 108 mg/dL — ABNORMAL HIGH (ref 70–99)
Potassium: 3.2 mmol/L — ABNORMAL LOW (ref 3.5–5.1)
Sodium: 132 mmol/L — ABNORMAL LOW (ref 135–145)

## 2020-12-03 MED ORDER — SODIUM CHLORIDE 0.9 % IV SOLN
Freq: Once | INTRAVENOUS | Status: AC
  Filled 2020-12-03: qty 250

## 2020-12-03 MED ORDER — SODIUM CHLORIDE 0.9% FLUSH
10.0000 mL | Freq: Once | INTRAVENOUS | Status: AC | PRN
  Administered 2020-12-03: 10 mL
  Filled 2020-12-03: qty 10

## 2020-12-03 MED ORDER — POTASSIUM CHLORIDE CRYS ER 20 MEQ PO TBCR: 20.0000 meq | EXTENDED_RELEASE_TABLET | Freq: Every day | ORAL | 0 refills | Status: DC

## 2020-12-03 MED ORDER — HEPARIN SOD (PORK) LOCK FLUSH 100 UNIT/ML IV SOLN
500.0000 [IU] | Freq: Once | INTRAVENOUS | Status: AC | PRN
  Administered 2020-12-03: 500 [IU]
  Filled 2020-12-03: qty 5

## 2020-12-03 MED ORDER — POTASSIUM CHLORIDE 20 MEQ/100ML IV SOLN
20.0000 meq | Freq: Once | INTRAVENOUS | Status: AC
  Administered 2020-12-03: 20 meq via INTRAVENOUS

## 2020-12-03 NOTE — Progress Notes (Signed)
Patient tolerated IVF 1 L infusion well today, no concerns voiced. Patient K today was 3.2. Patient informed and  received 20 meQ K. Patient educated on the importance of taking oral K at home and offered suggestions to tolerate it, including taking nausea meds and taking with applesauce. Patient verbalizes understanding. Patient discharged, stable.

## 2020-12-03 NOTE — Patient Instructions (Signed)
Potassium Chloride Injection °What is this medication? °POTASSIUM CHLORIDE (poe TASS i um KLOOR ide) prevents and treats low levels of potassium in your body. Potassium plays an important role in maintaining the health of your kidneys, heart, muscles, and nervous system. °This medicine may be used for other purposes; ask your health care provider or pharmacist if you have questions. °COMMON BRAND NAME(S): PROAMP °What should I tell my care team before I take this medication? °They need to know if you have any of these conditions: °Addison disease °Dehydration °Diabetes (high blood sugar) °Heart disease °High levels of potassium in the blood °Irregular heartbeat or rhythm °Kidney disease °Large areas of burned skin °An unusual or allergic reaction to potassium, other medications, foods, dyes, or preservatives °Pregnant or trying to get pregnant °Breast-feeding °How should I use this medication? °This medication is injected into a vein. It is given in a hospital or clinic setting. °Talk to your care team about the use of this medication in children. Special care may be needed. °Overdosage: If you think you have taken too much of this medicine contact a poison control center or emergency room at once. °NOTE: This medicine is only for you. Do not share this medicine with others. °What if I miss a dose? °This does not apply. This medication is not for regular use. °What may interact with this medication? °Do not take this medication with any of the following: °Certain diuretics such as spironolactone, triamterene °Eplerenone °Sodium polystyrene sulfonate °This medication may also interact with the following: °Certain medications for blood pressure or heart disease like lisinopril, losartan, quinapril, valsartan °Medications that lower your chance of fighting infection such as cyclosporine, tacrolimus °NSAIDs, medications for pain and inflammation, like ibuprofen or naproxen °Other potassium supplements °Salt  substitutes °This list may not describe all possible interactions. Give your health care provider a list of all the medicines, herbs, non-prescription drugs, or dietary supplements you use. Also tell them if you smoke, drink alcohol, or use illegal drugs. Some items may interact with your medicine. °What should I watch for while using this medication? °Visit your care team for regular checks on your progress. Tell your care team if your symptoms do not start to get better or if they get worse. °You may need blood work while you are taking this medication. °Avoid salt substitutes unless you are told otherwise by your care team. °What side effects may I notice from receiving this medication? °Side effects that you should report to your care team as soon as possible: °Allergic reactions--skin rash, itching, hives, swelling of the face, lips, tongue, or throat °High potassium level--muscle weakness, fast or irregular heartbeat °Side effects that usually do not require medical attention (report to your care team if they continue or are bothersome): °Diarrhea °Nausea °Stomach pain °Vomiting °This list may not describe all possible side effects. Call your doctor for medical advice about side effects. You may report side effects to FDA at 1-800-FDA-1088. °Where should I keep my medication? °This medication is given in a hospital or clinic. It will not be stored at home. °NOTE: This sheet is a summary. It may not cover all possible information. If you have questions about this medicine, talk to your doctor, pharmacist, or health care provider. °© 2022 [employer] (2020-04-08 00:00:00) ° °

## 2020-12-03 NOTE — Progress Notes (Signed)
Symptom Management [place]  [phone] [phone]  Patient Care Team: Danelle Berry, NP as PCP - General (Nurse Practitioner) Clent Jacks, RN as Oncology Nurse Navigator Cammie Sickle, MD as Consulting Physician (Internal Medicine) Gillis Ends, MD as Referring Physician (Obstetrics)   Name of the patient: Jeanette Allen  [id number]  12-14-1955   Date of visit: 12/03/20  Reason for Consult:  Jeanette Allen is a 65 y.o. 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  Patient was hospitalized several times in October 2022 at [facility] and [place] for recurrent SBO.  She is not felt to be a surgical candidate and venting G-tube was placed on 10/20/2020 for symptom relief.   Patient saw Dr. Rogue Bussing on 11/17/2020 and was felt to be slightly improved despite continued plaints of intermittent nausea/vomiting and poor oral intake.  Patient received Keytruda cycle #2.  Patient was an add-on to my clinic schedule yesterday due to persistent nausea.  She was having little relief with venting of PEG.  She also had minimal oral intake and reduced ostomy output.  Symptoms were concerning for recurrent malignant bowel obstruction.  She received IV fluids.  Labs were consistent with severe hypokalemia.  She also received IV potassium.  Patient was started on olanzapine for nausea.  Patient represents to clinic today for follow-up and rechecking of labs.  Patient reports that she is feeling much improved today.  She says that her appetite improved and she ate a pretty good dinner last night and has not had nausea or vomiting today.  She reports that her ostomy output has improved.  She denies other symptomatic complaints at  present.  Denies any neurologic complaints. Denies recent fevers or illnesses. Denies any easy bleeding or bruising. Denies chest pain. Denies any vomiting, constipation, or diarrhea. Denies urinary complaints. Patient offers no further specific complaints today.  PAST MEDICAL HISTORY: Past Medical History:  Diagnosis Date   Arthritis    Cancer ([place])    Hypertension     PAST SURGICAL HISTORY:  Past Surgical History:  Procedure Laterality Date   APPENDECTOMY     COLONOSCOPY WITH PROPOFOL N/A 10/01/2019   Procedure: COLONOSCOPY WITH PROPOFOL;  Surgeon: Lin Landsman, MD;  Location: [facility] ENDOSCOPY;  Service: Gastroenterology;  Laterality: N/A;   COLOSTOMY Right 2021   ESOPHAGOGASTRODUODENOSCOPY (EGD) WITH PROPOFOL N/A 09/12/2019   Procedure: ESOPHAGOGASTRODUODENOSCOPY (EGD) WITH PROPOFOL;  Surgeon: Lin Landsman, MD;  Location: [place];  Service: Gastroenterology;  Laterality: N/A;   GASTROSTOMY TUBE PLACEMENT Left 10/2020   IR IMAGING GUIDED PORT INSERTION  11/21/2020    HEMATOLOGY/ONCOLOGY HISTORY:  Oncology History Overview Note  #August 2021-ascitic fluid cytology-high-grade serous carcinoma of gynecologic origin; CT scan-behind the abdominal wall 4.6 x 2.1 mass ; within the mesentery 6.7 x 4.5 cm left of mid abdomen . BIOPSY of the peritoneal mass; high grade carcinoma ca-[phone] [Dr.Byrnett]; SEP 9th 2021- PET scan-shows peritoneal carcinomatosis; uterine uptake [Dr.Secord; unable to biopsy cervical stenosis] sigmoid colonic uptake concerning for malignancy.  No ovarian uptake.  CEA 125 +1300; CEA-2.    #Hepatitis C/ectopic pregnancy/PRBC transfusion 1995-untreated.[Dr.Vanga]  # 09/14/2019-neoadjuvant CARBO-TAXOL s/p #4 cycles-partial response; DEC 9th 2021-laparoscopy-surgery aborted given the need for multiple bowel resections; DEc 22nd, 2021-proceed with neoadjuvant carbotaxol; STARTING cycle #6- AUC-5 sec to persistent thrombocytopenia.  #Status post #7  CarboTaxol -  March 2022-debulking surgery TAH/BSO; bowel resections; ileostomy/mucous fistula.  # April 13th-restart CarboTaxol No. 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 GENETICS- NEG; HRD-    # PALLIATIVE CARE EVALUATION:  # PAIN MANAGEMENT:    DIAGNOSIS: Uterine cancer  STAGE:   IV      ;  GOALS: control  CURRENT/MOST RECENT THERAPY : Carbotaxol    Uterine cancer (HCC) (Resolved)  09/04/2019 Initial Diagnosis   Gynecologic cancer [facility])   Endometrial cancer (HCC)  09/14/2019 - 06/12/2020 Chemotherapy   Patient is on Treatment Plan : OVARIAN Carboplatin (AUC 6) / Paclitaxel (175) q21d x 6 cycles     10/05/2019 Initial Diagnosis   Endometrial cancer (HCC)    Genetic Testing   Negative germline genetic testing. No pathogenic variants identified on the [facility] panel. The report date is 10/01/2019. HRD testing was ordered but cancelled due to not enough sample/endometrial diagnosis.   The [facility] gene panel offered by [employer] includes sequencing and deletion/duplication testing of the following 35 genes: APC, ATM, AXIN2, BARD1, BMPR1A, BRCA1, BRCA2, BRIP1, CHD1, CDK4, CDKN2A, CHEK2, EPCAM (large rearrangement only), HOXB13, GALNT12, MLH1, MSH2, MSH3, MSH6, MUTYH, NBN, NTHL1, PALB2, PMS2, PTEN, RAD51C, RAD51D, RNF43, RPS20, SMAD4, STK11, and TP53. Sequencing was performed for select regions of POLE and POLD1, and large rearrangement analysis was performed for select regions of GREM1.    01/16/2020 Cancer Staging   Staging form: Corpus Uteri - Carcinoma and Carcinosarcoma, AJCC 8th Edition - Clinical: Stage IVB (cM1) - Signed by Earna Coder, MD on 01/16/2020   10/15/2020 -  Chemotherapy   Patient is on Treatment Plan : UTERINE Lenvatinib + Pembrolizumab q21d       ALLERGIES:  has No Known Allergies.  MEDICATIONS:  Current  Outpatient Medications  Medication Sig Dispense Refill   acetaminophen (TYLENOL) 500 MG tablet Take 1 tablet (500 mg total) by mouth every 6 (six) hours as needed for mild pain, fever or headache (or Fever >/= 101). 30 tablet 0   diphenhydramine-acetaminophen (TYLENOL PM) 25-500 MG TABS tablet Take 1 tablet by mouth at bedtime as needed. (Patient not taking: No sig reported)     ergocalciferol (VITAMIN D2) 1.25 MG (50000 UT) capsule Take 50,000 Units by mouth every Monday.     FLUoxetine (PROZAC) 40 MG capsule TAKE 1 CAPSULE  (40 MG TOTAL ) BY MOUTH DAILY 30 capsule 3   lenvatinib 20 mg daily dose (LENVIMA, 20 MG DAILY DOSE,) 2 x 10 MG capsule Take 2 capsules (20 mg total) by mouth daily. 60 capsule 1   lidocaine-prilocaine (EMLA) cream Apply 1 application topically as needed. Apply to port and cover with saran wrap 1-2 hours prior to port access 30 g 1   LORazepam (ATIVAN) 0.5 MG tablet Take 1 tablet (0.5 mg total) by mouth every 8 (eight) hours as needed for anxiety. /nausea. 60 tablet 0   OLANZapine (ZYPREXA) 10 MG tablet Take 1 tablet (10 mg total) by mouth at bedtime. 30 tablet 2   omeprazole (PRILOSEC) 40 MG capsule Take 1 capsule (40 mg total) by mouth 2 (two) times daily before a meal. (Patient not taking: Reported on 11/17/2020) 60 capsule 1   ondansetron (ZOFRAN) 8 MG tablet One pill every 8 hours as needed for nausea/vomitting. 60 tablet 1   ondansetron (ZOFRAN-ODT) 4 MG disintegrating tablet Take 4 mg by mouth every 8 (eight) hours as needed for nausea or vomiting.  potassium chloride 20 MEQ/15ML (10%) SOLN Take 30 mLs (40 mEq total) by mouth daily. 473 mL 0   traZODone (DESYREL) 50 MG tablet Take 1 tablet by mouth at bedtime as needed.     No current facility-administered medications for this visit.   Facility-Administered Medications Ordered in Other Visits  Medication Dose Route Frequency Provider Last Rate Last Admin   heparin lock flush 100 unit/mL  500 Units Intracatheter  Once PRN Cammie Sickle, MD        VITAL SIGNS: There were no vitals taken for this visit. There were no vitals filed for this visit.  Estimated body mass index is 22.31 kg/m as calculated from the following:   Height as of 11/21/20: $RemoveBefo'5\' 4"'camnWeRWHWA$  (1.626 m).   Weight as of 11/21/20: 130 lb (59 kg).  LABS: CBC:    Component Value Date/Time   WBC 6.4 12/02/2020 1029   HGB 11.4 (L) 12/02/2020 1029   HCT 34.4 (L) 12/02/2020 1029   PLT 190 12/02/2020 1029   MCV 104.9 (H) 12/02/2020 1029   NEUTROABS 4.8 12/02/2020 1029   LYMPHSABS 0.9 12/02/2020 1029   MONOABS 0.6 12/02/2020 1029   EOSABS 0.0 12/02/2020 1029   BASOSABS 0.0 12/02/2020 1029   Comprehensive Metabolic Panel:    Component Value Date/Time   NA 133 (L) 12/02/2020 1029   K 2.7 (LL) 12/02/2020 1029   CL 86 (L) 12/02/2020 1029   CO2 30 12/02/2020 1029   BUN 25 (H) 12/02/2020 1029   CREATININE 1.04 (H) 12/02/2020 1029   GLUCOSE 100 (H) 12/02/2020 1029   CALCIUM 9.8 12/02/2020 1029   AST 47 (H) 12/02/2020 1029   ALT 39 12/02/2020 1029   ALKPHOS 52 12/02/2020 1029   BILITOT 1.1 12/02/2020 1029   PROT 6.7 12/02/2020 1029   ALBUMIN 3.5 12/02/2020 1029    RADIOGRAPHIC STUDIES: IR IMAGING GUIDED PORT INSERTION  Addendum Date: 11/21/2020   ADDENDUM REPORT: 11/21/2020 14:20 ADDENDUM: As there was difficulty advancing the microwire centrally, 10 cc of Omnipaque 300 was administered via the micropuncture sheath demonstrating tortuosity but patency of the central aspect of the right internal jugular vein, facilitating access to the central venous system. Electronically Signed   By: Sandi Mariscal M.D.   On: 11/21/2020 14:20   Result Date: 11/21/2020 INDICATION: History of metastatic endometrial cancer. In need of durable intravenous access for the administration of chemotherapy. EXAM: IMPLANTED PORT A CATH PLACEMENT WITH ULTRASOUND AND FLUOROSCOPIC GUIDANCE COMPARISON:  PET-CT-08/25/2020 MEDICATIONS: None ANESTHESIA/SEDATION:  Moderate (conscious) sedation was employed during this procedure as administered by the Interventional Radiology RN. A total of Versed 2 mg and Fentanyl 100 mcg was administered intravenously. Moderate Sedation Time: 35 minutes. The patient's level of consciousness and vital signs were monitored continuously by radiology nursing throughout the procedure under my direct supervision. CONTRAST:  None FLUOROSCOPY TIME:  7 minutes, 18 seconds (27.7 mGy) COMPLICATIONS: None immediate. PROCEDURE: The procedure, risks, benefits, and alternatives were explained to the patient. Questions regarding the procedure were encouraged and answered. The patient understands and consents to the procedure. The right neck and chest were prepped with chlorhexidine in a sterile fashion, and a sterile drape was applied covering the operative field. Maximum barrier sterile technique with sterile gowns and gloves were used for the procedure. A timeout was performed prior to the initiation of the procedure. Local anesthesia was provided with 1% lidocaine with epinephrine. After creating a small venotomy incision, a micropuncture kit was utilized to access the internal jugular vein. Real-time  ultrasound guidance was utilized for vascular access including the acquisition of a permanent ultrasound image documenting patency of the accessed vessel. The microwire was utilized to measure appropriate catheter length. A subcutaneous port pocket was then created along the upper chest wall utilizing a combination of sharp and blunt dissection. The pocket was irrigated with sterile saline. A single lumen slim sized power injectable port was chosen for placement. The 8 Fr catheter was tunneled from the port pocket site to the venotomy incision. The port was placed in the pocket. The external catheter was trimmed to appropriate length. At the venotomy, an 8 Fr peel-away sheath was placed over a guidewire under fluoroscopic guidance. The catheter was then  placed through the sheath and the sheath was removed. Final catheter positioning was confirmed and documented with a fluoroscopic spot radiograph. The port was accessed with a Huber needle, aspirated and flushed with heparinized saline. The venotomy site was closed with an interrupted 4-0 Vicryl suture. The port pocket incision was closed with interrupted 2-0 Vicryl suture. Dermabond and Steri-strips were applied to both incisions. Dressings were applied. The patient tolerated the procedure well without immediate post procedural complication. FINDINGS: After catheter placement, the tip lies within the superior cavoatrial junction. The catheter aspirates and flushes normally and is ready for immediate use. IMPRESSION: Successful placement of a right internal jugular approach power injectable Port-A-Cath. The catheter is ready for immediate use. Electronically Signed: By: Sandi Mariscal M.D. On: 11/21/2020 13:21    PERFORMANCE STATUS (ECOG) : 2 - Symptomatic, <50% confined to bed  Review of Systems Unless otherwise noted, a complete review of systems is negative.  Physical Exam General: NAD Cardiovascular: regular rate and rhythm Pulmonary: clear anterior/posterior fields Abdomen: soft, nontender, + bowel sounds, PEG in place, ileostomy GU: no suprapubic tenderness Extremities: no edema, no joint deformities Skin: no rashes Neurological: Weakness but otherwise nonfocal  Assessment and Plan- Patient is a 65 y.o. 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  Patient was hospitalized several times in October 2022 at [facility] and [place] for recurrent SBO.  She is not felt to be a surgical candidate and venting G-tube was placed on 10/20/2020 for symptom relief.  Patient presents to [facility] today for follow-up  labs/fluids  Hyponatremia -we will proceed with IV fluids today  Hypokalemia -proceed with IV KCl 20 mEq today.  Patient was unable to tolerate oral KCl elixir.  She says this upset her stomach.  We will try tablets again and have patient mix with some applesauce and see if it is better tolerated.  Nausea -this has improved on scheduled antiemetics and olanzapine.  Continue antiemetic regimen.  Case and plan discussed with Dr. Rogue Bussing.  We could consider repeat CT of the abdomen/pelvis with and without contrast if symptoms worsen but will hold off for now given overall improvement.  We will plan to bring patient back later this week for fluids/labs/possible K     Patient expressed understanding and was in agreement with this plan. She also understands that She can call clinic at any time with any questions, concerns, or complaints.   Thank you for allowing me to participate in the care of this very pleasant patient.   Time Total: 20 minutes  Visit consisted of counseling and education dealing with the complex and emotionally intense issues of symptom management in the setting of serious illness.Greater  than 50%  of this time was spent counseling and coordinating care related to the above assessment and plan.  Signed by: Altha Harm, PhD, NP-C

## 2020-12-04 ENCOUNTER — Other Ambulatory Visit: Payer: Self-pay

## 2020-12-04 DIAGNOSIS — E876 Hypokalemia: Secondary | ICD-10-CM

## 2020-12-05 ENCOUNTER — Encounter: Payer: Medicaid Other | Admitting: Hospice and Palliative Medicine

## 2020-12-05 ENCOUNTER — Other Ambulatory Visit: Payer: Self-pay

## 2020-12-05 ENCOUNTER — Inpatient Hospital Stay: Payer: Medicare Other | Attending: Internal Medicine

## 2020-12-05 ENCOUNTER — Inpatient Hospital Stay: Payer: Medicare Other

## 2020-12-05 VITALS — BP 114/73 | HR 96 | Temp 97.3°F | Resp 17

## 2020-12-05 DIAGNOSIS — F418 Other specified anxiety disorders: Secondary | ICD-10-CM | POA: Insufficient documentation

## 2020-12-05 DIAGNOSIS — K56609 Unspecified intestinal obstruction, unspecified as to partial versus complete obstruction: Secondary | ICD-10-CM | POA: Diagnosis not present

## 2020-12-05 DIAGNOSIS — I10 Essential (primary) hypertension: Secondary | ICD-10-CM | POA: Diagnosis not present

## 2020-12-05 DIAGNOSIS — R0989 Other specified symptoms and signs involving the circulatory and respiratory systems: Secondary | ICD-10-CM | POA: Diagnosis not present

## 2020-12-05 DIAGNOSIS — Z87891 Personal history of nicotine dependence: Secondary | ICD-10-CM | POA: Diagnosis not present

## 2020-12-05 DIAGNOSIS — R5383 Other fatigue: Secondary | ICD-10-CM | POA: Diagnosis not present

## 2020-12-05 DIAGNOSIS — F32A Depression, unspecified: Secondary | ICD-10-CM | POA: Insufficient documentation

## 2020-12-05 DIAGNOSIS — G479 Sleep disorder, unspecified: Secondary | ICD-10-CM | POA: Insufficient documentation

## 2020-12-05 DIAGNOSIS — K209 Esophagitis, unspecified without bleeding: Secondary | ICD-10-CM | POA: Diagnosis not present

## 2020-12-05 DIAGNOSIS — Z5112 Encounter for antineoplastic immunotherapy: Secondary | ICD-10-CM | POA: Diagnosis not present

## 2020-12-05 DIAGNOSIS — F419 Anxiety disorder, unspecified: Secondary | ICD-10-CM | POA: Diagnosis not present

## 2020-12-05 DIAGNOSIS — E876 Hypokalemia: Secondary | ICD-10-CM | POA: Diagnosis not present

## 2020-12-05 DIAGNOSIS — C541 Malignant neoplasm of endometrium: Secondary | ICD-10-CM

## 2020-12-05 DIAGNOSIS — Z79899 Other long term (current) drug therapy: Secondary | ICD-10-CM | POA: Diagnosis not present

## 2020-12-05 DIAGNOSIS — N179 Acute kidney failure, unspecified: Secondary | ICD-10-CM | POA: Diagnosis not present

## 2020-12-05 DIAGNOSIS — R112 Nausea with vomiting, unspecified: Secondary | ICD-10-CM | POA: Diagnosis not present

## 2020-12-05 LAB — BASIC METABOLIC PANEL
Anion gap: 9 (ref 5–15)
BUN: 22 mg/dL (ref 8–23)
CO2: 31 mmol/L (ref 22–32)
Calcium: 9 mg/dL (ref 8.9–10.3)
Chloride: 96 mmol/L — ABNORMAL LOW (ref 98–111)
Creatinine, Ser: 1.26 mg/dL — ABNORMAL HIGH (ref 0.44–1.00)
GFR, Estimated: 47 mL/min — ABNORMAL LOW (ref >60)
Glucose, Bld: 109 mg/dL — ABNORMAL HIGH (ref 70–99)
Potassium: 3.5 mmol/L (ref 3.5–5.1)
Sodium: 136 mmol/L (ref 135–145)

## 2020-12-05 MED ORDER — SODIUM CHLORIDE 0.9 % IV SOLN: Freq: Once | INTRAVENOUS | Status: DC

## 2020-12-05 MED ORDER — SODIUM CHLORIDE 0.9 % IV SOLN
Freq: Once | INTRAVENOUS | Status: AC
  Filled 2020-12-05: qty 250

## 2020-12-05 MED ORDER — SODIUM CHLORIDE 0.9% FLUSH
10.0000 mL | Freq: Once | INTRAVENOUS | Status: AC | PRN
  Administered 2020-12-05: 10 mL
  Filled 2020-12-05: qty 10

## 2020-12-05 MED ORDER — ONDANSETRON HCL 4 MG/2ML IJ SOLN
8.0000 mg | Freq: Once | INTRAMUSCULAR | Status: AC
  Administered 2020-12-05: 8 mg via INTRAVENOUS
  Filled 2020-12-05: qty 4

## 2020-12-05 MED ORDER — HEPARIN SOD (PORK) LOCK FLUSH 100 UNIT/ML IV SOLN
500.0000 [IU] | Freq: Once | INTRAVENOUS | Status: AC | PRN
  Administered 2020-12-05: 500 [IU]
  Filled 2020-12-05: qty 5

## 2020-12-05 NOTE — Progress Notes (Signed)
Patient tolerated IVF 1 L infusion well today, Zofran IV also given. Patient states she feels better, Vitals stable. Discharged with husband.

## 2020-12-05 NOTE — Patient Instructions (Signed)
Ondansetron Injection What is this medication? ONDANSETRON (on DAN se tron) prevents nausea and vomiting from chemotherapy, radiation, or surgery. It works by blocking substances in the body that may cause nausea or vomiting. It belongs to a groupd of medications called antiemetics. This medicine may be used for other purposes; ask your health care provider or pharmacist if you have questions. COMMON BRAND NAME(S): Zofran, Zofran in Dextrose, Zofran Solution What should I tell my care team before I take this medication? They need to know if you have any of these conditions: Heart disease History of irregular heartbeat Liver disease Low levels of magnesium or potassium in the blood An unusual or allergic reaction to ondansetron, granisetron, other medications, foods, dyes, or preservatives Pregnant or trying to get pregnant Breast-feeding How should I use this medication? This medication is for infusion into a vein. It is given in a hospital or clinic. Talk to your care team regarding the use of this medication in children. Special care may be needed. Overdosage: If you think you have taken too much of this medicine contact a poison control center or emergency room at once. NOTE: This medicine is only for you. Do not share this medicine with others. What if I miss a dose? This does not apply. What may interact with this medication? Do not take this medication with any of the following: Apomorphine Certain medications for fungal infections like fluconazole, itraconazole, ketoconazole, posaconazole, voriconazole Cisapride Dronedarone Pimozide Thioridazine This medication may also interact with the following: Carbamazepine Certain medications for depression, anxiety, or psychotic disturbances Fentanyl Linezolid MAOIs like Carbex, Eldepryl, Marplan, Nardil, and Parnate Methylene blue (injected into a vein) Other medications that prolong the QT interval (cause an abnormal heart rhythm)  like dofetilide, ziprasidone Phenytoin Rifampicin Tramadol This list may not describe all possible interactions. Give your health care provider a list of all the medicines, herbs, non-prescription drugs, or dietary supplements you use. Also tell them if you smoke, drink alcohol, or use illegal drugs. Some items may interact with your medicine. What should I watch for while using this medication? Your condition will be monitored carefully while you are receiving this medication. What side effects may I notice from receiving this medication? Side effects that you should report to your care team as soon as possible: Allergic reactions--skin rash, itching, hives, swelling of the face, lips, tongue, or throat Bowel blockage--stomach cramping, unable to have a bowel movement or pass gas, loss of appetite, vomiting Chest pain (angina)--pain, pressure, or tightness in the chest, neck, back, or arms Heart rhythm changes--fast or irregular heartbeat, dizziness, feeling faint or lightheaded, chest pain, trouble breathing Irritability, confusion, fast or irregular heartbeat, muscle stiffness, twitching muscles, sweating, high fever, seizure, chills, vomiting, diarrhea, which may be signs of serotonin syndrome Side effects that usually do not require medical attention (report to your care team if they continue or are bothersome): Constipation Diarrhea General discomfort and fatigue Headache This list may not describe all possible side effects. Call your doctor for medical advice about side effects. You may report side effects to FDA at 1-800-FDA-1088. Where should I keep my medication? This medication is given in a hospital or clinic and will not be stored at home. NOTE: This sheet is a summary. It may not cover all possible information. If you have questions about this medicine, talk to your doctor, pharmacist, or health care provider.  2022 Elsevier/Gold Standard (2020-01-25 00:00:00)

## 2020-12-08 ENCOUNTER — Encounter: Payer: Self-pay | Admitting: Internal Medicine

## 2020-12-08 ENCOUNTER — Other Ambulatory Visit: Payer: Self-pay

## 2020-12-08 ENCOUNTER — Inpatient Hospital Stay: Payer: Medicare Other

## 2020-12-08 ENCOUNTER — Inpatient Hospital Stay (HOSPITAL_BASED_OUTPATIENT_CLINIC_OR_DEPARTMENT_OTHER): Payer: Medicare Other | Admitting: Internal Medicine

## 2020-12-08 VITALS — BP 111/77 | HR 118 | Temp 96.8°F | Resp 16 | Wt 131.4 lb

## 2020-12-08 DIAGNOSIS — E876 Hypokalemia: Secondary | ICD-10-CM | POA: Diagnosis not present

## 2020-12-08 DIAGNOSIS — C541 Malignant neoplasm of endometrium: Secondary | ICD-10-CM

## 2020-12-08 DIAGNOSIS — I10 Essential (primary) hypertension: Secondary | ICD-10-CM

## 2020-12-08 DIAGNOSIS — R112 Nausea with vomiting, unspecified: Secondary | ICD-10-CM

## 2020-12-08 DIAGNOSIS — Z7189 Other specified counseling: Secondary | ICD-10-CM

## 2020-12-08 DIAGNOSIS — Z87891 Personal history of nicotine dependence: Secondary | ICD-10-CM

## 2020-12-08 DIAGNOSIS — Z5112 Encounter for antineoplastic immunotherapy: Secondary | ICD-10-CM | POA: Diagnosis not present

## 2020-12-08 LAB — COMPREHENSIVE METABOLIC PANEL
ALT: 32 U/L (ref 0–44)
AST: 42 U/L — ABNORMAL HIGH (ref 15–41)
Albumin: 3.1 g/dL — ABNORMAL LOW (ref 3.5–5.0)
Alkaline Phosphatase: 54 U/L (ref 38–126)
Anion gap: 13 (ref 5–15)
BUN: 24 mg/dL — ABNORMAL HIGH (ref 8–23)
CO2: 27 mmol/L (ref 22–32)
Calcium: 8.4 mg/dL — ABNORMAL LOW (ref 8.9–10.3)
Chloride: 96 mmol/L — ABNORMAL LOW (ref 98–111)
Creatinine, Ser: 1.08 mg/dL — ABNORMAL HIGH (ref 0.44–1.00)
GFR, Estimated: 57 mL/min — ABNORMAL LOW (ref >60)
Glucose, Bld: 118 mg/dL — ABNORMAL HIGH (ref 70–99)
Potassium: 3 mmol/L — ABNORMAL LOW (ref 3.5–5.1)
Sodium: 136 mmol/L (ref 135–145)
Total Bilirubin: 0.8 mg/dL (ref 0.3–1.2)
Total Protein: 6.5 g/dL (ref 6.5–8.1)

## 2020-12-08 LAB — CBC WITH DIFFERENTIAL/PLATELET
Abs Immature Granulocytes: 0.03 10*3/uL (ref 0.00–0.07)
Basophils Absolute: 0 10*3/uL (ref 0.0–0.1)
Basophils Relative: 0 %
Eosinophils Absolute: 0 10*3/uL (ref 0.0–0.5)
Eosinophils Relative: 0 %
HCT: 34.2 % — ABNORMAL LOW (ref 36.0–46.0)
Hemoglobin: 11.2 g/dL — ABNORMAL LOW (ref 12.0–15.0)
Immature Granulocytes: 0 %
Lymphocytes Relative: 14 %
Lymphs Abs: 0.9 10*3/uL (ref 0.7–4.0)
MCH: 34.8 pg — ABNORMAL HIGH (ref 26.0–34.0)
MCHC: 32.7 g/dL (ref 30.0–36.0)
MCV: 106.2 fL — ABNORMAL HIGH (ref 80.0–100.0)
Monocytes Absolute: 0.5 10*3/uL (ref 0.1–1.0)
Monocytes Relative: 8 %
Neutro Abs: 5.2 10*3/uL (ref 1.7–7.7)
Neutrophils Relative %: 78 %
Platelets: 175 10*3/uL (ref 150–400)
RBC: 3.22 MIL/uL — ABNORMAL LOW (ref 3.87–5.11)
RDW: 14.1 % (ref 11.5–15.5)
WBC: 6.7 10*3/uL (ref 4.0–10.5)
nRBC: 0 % (ref 0.0–0.2)

## 2020-12-08 MED ORDER — SODIUM CHLORIDE 0.9 % IV SOLN: Freq: Once | INTRAVENOUS | Status: DC

## 2020-12-08 MED ORDER — LORAZEPAM 0.5 MG PO TABS: 1.0000 mg | ORAL_TABLET | Freq: Once | ORAL | Status: DC

## 2020-12-08 MED ORDER — SODIUM CHLORIDE 0.9 % IV SOLN
Freq: Once | INTRAVENOUS | Status: AC
  Filled 2020-12-08: qty 250

## 2020-12-08 MED ORDER — ONDANSETRON HCL 4 MG/2ML IJ SOLN
8.0000 mg | Freq: Once | INTRAMUSCULAR | Status: AC
  Administered 2020-12-08: 8 mg via INTRAVENOUS
  Filled 2020-12-08: qty 4

## 2020-12-08 MED ORDER — SODIUM CHLORIDE 0.9 % IV SOLN
200.0000 mg | Freq: Once | INTRAVENOUS | Status: AC
  Administered 2020-12-08: 200 mg via INTRAVENOUS
  Filled 2020-12-08: qty 8

## 2020-12-08 MED ORDER — HEPARIN SOD (PORK) LOCK FLUSH 100 UNIT/ML IV SOLN
500.0000 [IU] | Freq: Once | INTRAVENOUS | Status: AC
  Filled 2020-12-08: qty 5

## 2020-12-08 MED ORDER — SODIUM CHLORIDE 0.9% FLUSH
10.0000 mL | INTRAVENOUS | Status: AC | PRN
  Administered 2020-12-08: 10 mL via INTRAVENOUS
  Filled 2020-12-08: qty 10

## 2020-12-08 MED ORDER — HEPARIN SOD (PORK) LOCK FLUSH 100 UNIT/ML IV SOLN
INTRAVENOUS | Status: AC
  Filled 2020-12-08: qty 5

## 2020-12-08 MED ORDER — LORAZEPAM 0.5 MG PO TABS
1.0000 mg | ORAL_TABLET | Freq: Once | ORAL | Status: AC
  Administered 2020-12-08: 1 mg via ORAL
  Filled 2020-12-08: qty 2

## 2020-12-08 MED ORDER — HEPARIN SOD (PORK) LOCK FLUSH 100 UNIT/ML IV SOLN
500.0000 [IU] | Freq: Once | INTRAVENOUS | Status: AC | PRN
  Administered 2020-12-08: 500 [IU]
  Filled 2020-12-08: qty 5

## 2020-12-08 NOTE — Progress Notes (Signed)
Patient still having nausea/vomiting that the prescribed medications does help a little.  Has new occasional back pain that is more noticeable at night.  HR is elevated at 118 in office today and she relates it to current nausea.

## 2020-12-08 NOTE — Progress Notes (Signed)
Spoke with MD regarding K+ of 3 today, pt is taking K+ at home and is oked for tx today along with 1 L of IVF

## 2020-12-08 NOTE — Assessment & Plan Note (Addendum)
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  No new peritoneal disease noted.  Clinical progression noted treatment given recurrent small bowel obstruction [see below].  Currently on Keytruda; Lenvima on hold because of bowel obstruction.  Discussed/reviewed rising tumor markers.  # Proceed with keytruda #3. Labs today reviewed;  acceptable for treatment today.  Unfortunately the chance of treatment working is quite modest.  Development worker, international aid for now.  Continue to hold off Lenvima because of bowel obstruction/and high risk of side effects.   # nausea/vomitting- from intermittent SBO s/p PEG decompression-clinically stable.  # Hypoklamia: Potassium 3.0 recommend continued KDur at home.   # Acute renal failure: today creatinine is 1.3/improved from 2.7.  Continue IV fluids as needed.  #Nausea with vomiting-likely secondary to recurrent partial small bowel obstruction s/p PEG tube placement/gastric decompression/progression of malignancy.  Continue monitoring for now.  #Nausea/difficulty sleeping/anxiety- continue Ativan/ trazadone to 100 mg qhs- STABLE.   # fatgiue- depression-STABLE; on prozac to 40 mg/day   # PROGNOSIS: Again had a long discussion the patient and her husband regarding overall poor prognosis; and the fact that she is clinically progressing.  However await above CT scan is ordered.  Discussed that if progressive disease is noted-patient is a poor candidate for any further systemic therapies; and hospice would be recommended.  Also discussed with [employer].  # DISPOSITION:  # proceed with [place]; IVFs over 1 lit today;  # next week- [facility]-labs- cbc/bmp; possible IVF x1 hours; KCL 20 meq #  Follow up in week of Dec 27th MD; labs- cbc/cmp; mag possible IV fluidsKCL 20  meq;Keytruda; CT scan prior- .-Dr.B

## 2020-12-08 NOTE — Progress Notes (Signed)
[facility] NOTE  Patient Care Team: Danelle Berry, NP as PCP - General (Nurse Practitioner) Clent Jacks, RN as Oncology Nurse Navigator Cammie Sickle, MD as Consulting Physician (Internal Medicine) Gillis Ends, MD as Referring Physician (Obstetrics)  CHIEF COMPLAINTS/PURPOSE OF CONSULTATION: high grade serous cancer   #  Oncology History Overview Note  #August 2021-ascitic fluid cytology-high-grade serous carcinoma of gynecologic origin; CT scan-behind the abdominal wall 4.6 x 2.1 mass ; within the mesentery 6.7 x 4.5 cm left of mid abdomen . BIOPSY of the peritoneal mass; high grade carcinoma ca-[phone] [Dr.Byrnett]; SEP 9th 2021- PET scan-shows peritoneal carcinomatosis; uterine uptake [Dr.Secord; unable to biopsy cervical stenosis] sigmoid colonic uptake concerning for malignancy.  No ovarian uptake.  CEA 125 +1300; CEA-2.    #Hepatitis C/ectopic pregnancy/PRBC transfusion 1995-untreated.[Dr.Vanga]  # 09/14/2019-neoadjuvant CARBO-TAXOL s/p #4 cycles-partial response; DEC 9th 2021-laparoscopy-surgery aborted given the need for multiple bowel resections; DEc 22nd, 2021-proceed with neoadjuvant carbotaxol; STARTING cycle #6- AUC-5 sec to persistent thrombocytopenia.  #Status post #7 CarboTaxol -March 2022-debulking surgery TAH/BSO; bowel resections; ileostomy/mucous fistula.  # April 13th-restart CarboTaxol No. 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 GENETICS- NEG; HRD-    # PALLIATIVE CARE EVALUATION:  # PAIN MANAGEMENT:    DIAGNOSIS: Uterine cancer  STAGE:   IV      ;  GOALS: control  CURRENT/MOST RECENT THERAPY : Carbotaxol    Uterine cancer ([place]) (Resolved)  09/04/2019 Initial Diagnosis   Gynecologic cancer [facility])   Endometrial cancer ([place])  09/14/2019 - 06/12/2020 Chemotherapy   Patient is on  Treatment Plan : OVARIAN Carboplatin (AUC 6) / Paclitaxel (175) q21d x 6 cycles     10/05/2019 Initial Diagnosis   Endometrial cancer ([facility])    Genetic Testing   Negative germline genetic testing. No pathogenic variants identified on the [facility] panel. The report date is 10/01/2019. HRD testing was ordered but cancelled due to not enough sample/endometrial diagnosis.   The [facility] gene panel offered by [employer] includes sequencing and deletion/duplication testing of the following 35 genes: APC, ATM, AXIN2, BARD1, BMPR1A, BRCA1, BRCA2, BRIP1, CHD1, CDK4, CDKN2A, CHEK2, EPCAM (large rearrangement only), HOXB13, GALNT12, MLH1, MSH2, MSH3, MSH6, MUTYH, NBN, NTHL1, PALB2, PMS2, PTEN, RAD51C, RAD51D, RNF43, RPS20, SMAD4, STK11, and TP53. Sequencing was performed for select regions of POLE and POLD1, and large rearrangement analysis was performed for select regions of GREM1.    01/16/2020 Cancer Staging   Staging form: Corpus Uteri - Carcinoma and Carcinosarcoma, AJCC 8th Edition - Clinical: Stage IVB (cM1) - Signed by Cammie Sickle, MD on 01/16/2020    10/15/2020 -  Chemotherapy   Patient is on Treatment Plan : UTERINE Lenvatinib + Pembrolizumab q21d        HISTORY OF PRESENTING ILLNESS: Ambulating in a wheelchair.  Accompanied by husband.    Jeanette Allen 65 y.o.  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  S/p PEG tube/gastric decompression. -recurrent is currently on Keytruda plus [Lenvima not started sec to SBO].  Patient has been evaluated by symptom management clinic for dehydration/nausea vomiting.  Received IV fluids.  Patient is nausea with vomiting overnight.  Currently improved.  Denies any worsening abdominal pain.    However overall she feels poorly.  Feels fatigued.  Review of Systems  Constitutional:  Positive for malaise/fatigue and weight loss. Negative for chills, diaphoresis and fever.  HENT:  Negative for nosebleeds and sore throat.   Eyes:  Negative for double vision.  Respiratory:  Negative for cough, hemoptysis, sputum production, shortness of breath and wheezing.   Cardiovascular:  Negative for chest pain, palpitations, orthopnea and leg swelling.  Gastrointestinal:  Positive for abdominal pain, nausea and vomiting. Negative for blood in stool, diarrhea, heartburn and melena.  Genitourinary:  Negative for dysuria, frequency and urgency.  Musculoskeletal:  Positive for back pain and myalgias. Negative for joint pain.  Skin: Negative.  Negative for itching and rash.  Neurological:  Negative for dizziness, tingling, focal weakness, weakness and headaches.  Endo/Heme/Allergies:  Does not bruise/bleed easily.  Psychiatric/Behavioral:  Negative for depression. The patient is not nervous/anxious and does not have insomnia.     MEDICAL HISTORY:  Past Medical History:  Diagnosis Date   Arthritis    Cancer ([place])    Hypertension     SURGICAL HISTORY: Past Surgical History:  Procedure Laterality Date   APPENDECTOMY     COLONOSCOPY WITH PROPOFOL N/A 10/01/2019   Procedure: COLONOSCOPY WITH PROPOFOL;  Surgeon: Lin Landsman, MD;  Location: [facility] ENDOSCOPY;  Service: Gastroenterology;  Laterality: N/A;   COLOSTOMY Right 2021   ESOPHAGOGASTRODUODENOSCOPY (EGD) WITH PROPOFOL N/A 09/12/2019   Procedure: ESOPHAGOGASTRODUODENOSCOPY (EGD) WITH PROPOFOL;  Surgeon: Lin Landsman, MD;  Location: [facility];  Service: Gastroenterology;  Laterality: N/A;   GASTROSTOMY TUBE PLACEMENT Left 10/2020   IR IMAGING GUIDED PORT INSERTION  11/21/2020    SOCIAL HISTORY: Social History   Socioeconomic History   Marital status: Married    Spouse name: Annie Main   Number of children: 3   Years of education: Not on file   Highest education level: Not on file  Occupational History   Not on file  Tobacco Use   Smoking status: Former    Types: Cigarettes   Smokeless tobacco: Never   Vaping Use   Vaping Use: Never used  Substance and Sexual Activity   Alcohol use: Not Currently   Drug use: Never   Sexual activity: Yes  Other Topics Concern   Not on file  Social History Narrative   Lives with husband; near [place]. Worked Network engineer at [employer]; smoke 1/2 ppd/slowed; no alcohol; 3 children [2 boys; one girl- [employer]]   Social Determinants of Health   Financial Resource Strain: Not on file  Food Insecurity: Not on file  Transportation Needs: Not on file  Physical Activity: Not on file  Stress: Not on file  Social Connections: Not on file  Intimate Partner Violence: Not on file    FAMILY HISTORY: Family History  Problem Relation Age of Onset   Diabetes Maternal Grandmother    Hypertension Maternal Grandmother    Stroke Maternal Grandmother     ALLERGIES:  has No Known Allergies.  MEDICATIONS:  Current Outpatient Medications  Medication Sig Dispense Refill   acetaminophen (TYLENOL) 500 MG tablet Take 1 tablet (500 mg total) by mouth every 6 (six) hours as needed for mild pain, fever or headache (or Fever >/= 101). 30 tablet 0   ergocalciferol (VITAMIN D2) 1.25 MG (50000 UT) capsule Take 50,000 Units by mouth every Monday.     FLUoxetine (PROZAC) 40 MG capsule TAKE 1 CAPSULE  (40 MG TOTAL ) BY MOUTH DAILY 30 capsule 3   lidocaine-prilocaine (EMLA) cream Apply 1 application topically as needed. Apply to port and cover with saran wrap 1-2 hours prior to port access 30 g 1   LORazepam (ATIVAN) 0.5 MG tablet Take  1 tablet (0.5 mg total) by mouth every 8 (eight) hours as needed for anxiety. /nausea. 60 tablet 0   OLANZapine (ZYPREXA) 10 MG tablet Take 1 tablet (10 mg total) by mouth at bedtime. 30 tablet 2   ondansetron (ZOFRAN) 8 MG tablet One pill every 8 hours as needed for nausea/vomitting. 60 tablet 1   ondansetron (ZOFRAN-ODT) 4 MG disintegrating tablet Take 4 mg by mouth every 8 (eight) hours as needed for nausea or vomiting.     potassium  chloride SA (KLOR-CON M) 20 MEQ tablet Take 1 tablet (20 mEq total) by mouth daily. 30 tablet 0   traZODone (DESYREL) 50 MG tablet Take 1 tablet by mouth at bedtime as needed.     diphenhydramine-acetaminophen (TYLENOL PM) 25-500 MG TABS tablet Take 1 tablet by mouth at bedtime as needed. (Patient not taking: Reported on 12/03/2020)     lenvatinib 20 mg daily dose (LENVIMA, 20 MG DAILY DOSE,) 2 x 10 MG capsule Take 2 capsules (20 mg total) by mouth daily. (Patient not taking: Reported on 12/08/2020) 60 capsule 1   omeprazole (PRILOSEC) 40 MG capsule Take 1 capsule (40 mg total) by mouth 2 (two) times daily before a meal. (Patient not taking: Reported on 11/17/2020) 60 capsule 1   No current facility-administered medications for this visit.   Facility-Administered Medications Ordered in Other Visits  Medication Dose Route Frequency Provider Last Rate Last Admin   heparin lock flush 100 UNIT/ML injection            heparin lock flush 100 unit/mL  500 Units Intravenous Once Charlaine Dalton R, MD       sodium chloride flush (NS) 0.9 % injection 10 mL  10 mL Intravenous PRN Cammie Sickle, MD   10 mL at 12/08/20 1037      .  PHYSICAL EXAMINATION: ECOG PERFORMANCE STATUS: 1 - Symptomatic but completely ambulatory  Vitals:   12/08/20 1100  BP: 111/77  Pulse: (!) 118  Resp: 16  Temp: (!) 96.8 F (36 C)  SpO2: 98%   Filed Weights   12/08/20 1100  Weight: 131 lb 6.4 oz (59.6 kg)     Physical Exam Constitutional:      Comments: Ambulating independently.  Accompanied by husband.  HENT:     Head: Normocephalic and atraumatic.     Mouth/Throat:     Pharynx: No oropharyngeal exudate.  Eyes:     Pupils: Pupils are equal, round, and reactive to light.  Cardiovascular:     Rate and Rhythm: Normal rate and regular rhythm.  Pulmonary:     Effort: Pulmonary effort is normal. No respiratory distress.     Breath sounds: Normal breath sounds. No wheezing.  Abdominal:     General:  Bowel sounds are normal. There is distension.     Palpations: Abdomen is soft. There is no mass.     Tenderness: There is no abdominal tenderness. There is no guarding or rebound.  Musculoskeletal:        General: No tenderness. Normal range of motion.     Cervical back: Normal range of motion and neck supple.  Skin:    General: Skin is warm.  Neurological:     Mental Status: She is alert and oriented to person, place, and time.  Psychiatric:        Mood and Affect: Affect normal.     LABORATORY DATA:  I have reviewed the data as listed Lab Results  Component Value Date   WBC 6.7  12/08/2020   HGB 11.2 (L) 12/08/2020   HCT 34.2 (L) 12/08/2020   MCV 106.2 (H) 12/08/2020   PLT 175 12/08/2020   Recent Labs    10/11/20 1142 10/12/20 0446 11/26/20 0906 12/02/20 1029 12/03/20 0946 12/05/20 0854 12/08/20 1031  NA 129*   < > 134* 133* 132* 136 136  K 3.8   < > 3.0* 2.7* 3.2* 3.5 3.0*  CL 96*   < > 92* 86* 93* 96* 96*  CO2 20*   < > 32 30 32 31 27  GLUCOSE 127*   < > 113* 100* 108* 109* 118*  BUN 22   < > 25* 25* 23 22 24*  CREATININE 1.29*   < > 1.12* 1.04* 1.06* 1.26* 1.08*  CALCIUM 8.3*   < > 9.0 9.8 9.0 9.0 8.4*  GFRNONAA 46*   < > 54* 59* 58* 47* 57*  PROT 7.6   < > 6.9 6.7  --   --  6.5  ALBUMIN 4.1   < > 3.2* 3.5  --   --  3.1*  AST 21   < > 51* 47*  --   --  42*  ALT 21   < > 46* 39  --   --  32  ALKPHOS 67   < > 52 52  --   --  54  BILITOT 1.1   < > 0.5 1.1  --   --  0.8  BILIDIR 0.2  --   --   --   --   --   --   IBILI 0.9  --   --   --   --   --   --    < > = values in this interval not displayed.    RADIOGRAPHIC STUDIES: I have personally reviewed the radiological images as listed and agreed with the findings in the report. IR IMAGING GUIDED PORT INSERTION  Addendum Date: 11/21/2020   ADDENDUM REPORT: 11/21/2020 14:20 ADDENDUM: As there was difficulty advancing the microwire centrally, 10 cc of Omnipaque 300 was administered via the micropuncture sheath  demonstrating tortuosity but patency of the central aspect of the right internal jugular vein, facilitating access to the central venous system. Electronically Signed   By: Sandi Mariscal M.D.   On: 11/21/2020 14:20   Result Date: 11/21/2020 INDICATION: History of metastatic endometrial cancer. In need of durable intravenous access for the administration of chemotherapy. EXAM: IMPLANTED PORT A CATH PLACEMENT WITH ULTRASOUND AND FLUOROSCOPIC GUIDANCE COMPARISON:  PET-CT-08/25/2020 MEDICATIONS: None ANESTHESIA/SEDATION: Moderate (conscious) sedation was employed during this procedure as administered by the Interventional Radiology RN. A total of Versed 2 mg and Fentanyl 100 mcg was administered intravenously. Moderate Sedation Time: 35 minutes. The patient's level of consciousness and vital signs were monitored continuously by radiology nursing throughout the procedure under my direct supervision. CONTRAST:  None FLUOROSCOPY TIME:  7 minutes, 18 seconds (37.1 mGy) COMPLICATIONS: None immediate. PROCEDURE: The procedure, risks, benefits, and alternatives were explained to the patient. Questions regarding the procedure were encouraged and answered. The patient understands and consents to the procedure. The right neck and chest were prepped with chlorhexidine in a sterile fashion, and a sterile drape was applied covering the operative field. Maximum barrier sterile technique with sterile gowns and gloves were used for the procedure. A timeout was performed prior to the initiation of the procedure. Local anesthesia was provided with 1% lidocaine with epinephrine. After creating a small venotomy incision, a micropuncture kit was utilized to  access the internal jugular vein. Real-time ultrasound guidance was utilized for vascular access including the acquisition of a permanent ultrasound image documenting patency of the accessed vessel. The microwire was utilized to measure appropriate catheter length. A subcutaneous port  pocket was then created along the upper chest wall utilizing a combination of sharp and blunt dissection. The pocket was irrigated with sterile saline. A single lumen slim sized power injectable port was chosen for placement. The 8 Fr catheter was tunneled from the port pocket site to the venotomy incision. The port was placed in the pocket. The external catheter was trimmed to appropriate length. At the venotomy, an 8 Fr peel-away sheath was placed over a guidewire under fluoroscopic guidance. The catheter was then placed through the sheath and the sheath was removed. Final catheter positioning was confirmed and documented with a fluoroscopic spot radiograph. The port was accessed with a Huber needle, aspirated and flushed with heparinized saline. The venotomy site was closed with an interrupted 4-0 Vicryl suture. The port pocket incision was closed with interrupted 2-0 Vicryl suture. Dermabond and Steri-strips were applied to both incisions. Dressings were applied. The patient tolerated the procedure well without immediate post procedural complication. FINDINGS: After catheter placement, the tip lies within the superior cavoatrial junction. The catheter aspirates and flushes normally and is ready for immediate use. IMPRESSION: Successful placement of a right internal jugular approach power injectable Port-A-Cath. The catheter is ready for immediate use. Electronically Signed: By: Sandi Mariscal M.D. On: 11/21/2020 13:21    ASSESSMENT & PLAN:   Endometrial cancer [facility]) #Recurrent/progressive STAGE IV-High-grade serous carcinoma endometrial] pre-treatment- August 2022-PET scan no obvious evidence of any progression noted; minimal residual nodularity in the left upper quadrant without any clear metabolic uptake.  No new peritoneal disease noted.  Clinical progression noted treatment given recurrent small bowel obstruction [see below].  Currently on Keytruda; Lenvima on hold because of bowel obstruction.   Discussed/reviewed rising tumor markers.  # Proceed with keytruda #3. Labs today reviewed;  acceptable for treatment today.  Unfortunately the chance of treatment working is quite modest.  Development worker, international aid for now.  Continue to hold off Lenvima because of bowel obstruction/and high risk of side effects.   # nausea/vomitting- from intermittent SBO s/p PEG decompression-clinically stable.  # Hypoklamia: Potassium 3.0 recommend continued KDur at home.   # Acute renal failure: today creatinine is 1.3/improved from 2.7.  Continue IV fluids as needed.  #Nausea with vomiting-likely secondary to recurrent partial small bowel obstruction s/p PEG tube placement/gastric decompression/progression of malignancy.  Continue monitoring for now.  #Nausea/difficulty sleeping/anxiety- continue Ativan/ trazadone to 100 mg qhs- STABLE.   # fatgiue- depression-STABLE; on prozac to 40 mg/day   # PROGNOSIS: Again had a long discussion the patient and her husband regarding overall poor prognosis; and the fact that she is clinically progressing.  However await above CT scan is ordered.  Discussed that if progressive disease is noted-patient is a poor candidate for any further systemic therapies; and hospice would be recommended.  Also discussed with [employer].  # DISPOSITION:  # proceed with [place]; IVFs over 1 lit today;  # next week- [facility]-labs- cbc/bmp; possible IVF x1 hours; KCL 20 meq #  Follow up in week of Dec 27th MD; labs- cbc/cmp; mag possible IV fluidsKCL 20 meq;Keytruda; CT scan prior- .-Dr.B    All questions were answered. The patient knows to call the clinic with any problems, questions or concerns.   Cammie Sickle, MD 12/08/2020 2:38  PM

## 2020-12-09 LAB — CA 125: Cancer Antigen (CA) 125: 664 U/mL — ABNORMAL HIGH (ref 0.0–38.1)

## 2020-12-11 ENCOUNTER — Telehealth: Payer: Self-pay | Admitting: *Deleted

## 2020-12-11 NOTE — Telephone Encounter (Signed)
Patient called reporting that she is having mid sternal chest pain off an on for several days and it is getting worse. She reports that she has been vomiting . The pain does not radiate, she does not feel shortness of breath. She states she is unable to eat. She has taken Tylenol as that is all she has on hand for pain and it works for a short time, but does not last. Please advise

## 2020-12-11 NOTE — Telephone Encounter (Signed)
Per Jeanette Allen, pt can be seen in [facility] tomorrow. Pt contacted and scheduled for [facility] 12/9. Discussed ER triggers. Pt verbalized understanding,

## 2020-12-12 ENCOUNTER — Ambulatory Visit
Admission: RE | Admit: 2020-12-12 | Discharge: 2020-12-12 | Disposition: A | Discharge status: Home / Self Care | Payer: Medicare Other | Source: Ambulatory Visit | Attending: Hospice and Palliative Medicine | Admitting: Hospice and Palliative Medicine

## 2020-12-12 ENCOUNTER — Other Ambulatory Visit: Payer: Self-pay

## 2020-12-12 ENCOUNTER — Inpatient Hospital Stay: Payer: Medicare Other

## 2020-12-12 ENCOUNTER — Inpatient Hospital Stay (HOSPITAL_BASED_OUTPATIENT_CLINIC_OR_DEPARTMENT_OTHER): Payer: Medicare Other | Admitting: Hospice and Palliative Medicine

## 2020-12-12 VITALS — BP 120/69 | HR 119 | Temp 96.9°F | Resp 17

## 2020-12-12 VITALS — BP 126/76 | HR 120 | Temp 96.5°F | Resp 17

## 2020-12-12 DIAGNOSIS — R110 Nausea: Secondary | ICD-10-CM

## 2020-12-12 DIAGNOSIS — K219 Gastro-esophageal reflux disease without esophagitis: Secondary | ICD-10-CM

## 2020-12-12 DIAGNOSIS — E876 Hypokalemia: Secondary | ICD-10-CM

## 2020-12-12 DIAGNOSIS — C541 Malignant neoplasm of endometrium: Secondary | ICD-10-CM

## 2020-12-12 DIAGNOSIS — Z5112 Encounter for antineoplastic immunotherapy: Secondary | ICD-10-CM | POA: Diagnosis not present

## 2020-12-12 DIAGNOSIS — G8929 Other chronic pain: Secondary | ICD-10-CM

## 2020-12-12 LAB — CBC WITH DIFFERENTIAL/PLATELET
Abs Immature Granulocytes: 0.03 10*3/uL (ref 0.00–0.07)
Basophils Absolute: 0 10*3/uL (ref 0.0–0.1)
Basophils Relative: 0 %
Eosinophils Absolute: 0 10*3/uL (ref 0.0–0.5)
Eosinophils Relative: 0 %
HCT: 34.4 % — ABNORMAL LOW (ref 36.0–46.0)
Hemoglobin: 11.3 g/dL — ABNORMAL LOW (ref 12.0–15.0)
Immature Granulocytes: 0 %
Lymphocytes Relative: 11 %
Lymphs Abs: 0.8 10*3/uL (ref 0.7–4.0)
MCH: 34.3 pg — ABNORMAL HIGH (ref 26.0–34.0)
MCHC: 32.8 g/dL (ref 30.0–36.0)
MCV: 104.6 fL — ABNORMAL HIGH (ref 80.0–100.0)
Monocytes Absolute: 0.5 10*3/uL (ref 0.1–1.0)
Monocytes Relative: 6 %
Neutro Abs: 6.3 10*3/uL (ref 1.7–7.7)
Neutrophils Relative %: 83 %
Platelets: 186 10*3/uL (ref 150–400)
RBC: 3.29 MIL/uL — ABNORMAL LOW (ref 3.87–5.11)
RDW: 14.2 % (ref 11.5–15.5)
WBC: 7.6 10*3/uL (ref 4.0–10.5)
nRBC: 0 % (ref 0.0–0.2)

## 2020-12-12 LAB — COMPREHENSIVE METABOLIC PANEL
ALT: 75 U/L — ABNORMAL HIGH (ref 0–44)
AST: 85 U/L — ABNORMAL HIGH (ref 15–41)
Albumin: 3 g/dL — ABNORMAL LOW (ref 3.5–5.0)
Alkaline Phosphatase: 64 U/L (ref 38–126)
Anion gap: 17 — ABNORMAL HIGH (ref 5–15)
BUN: 37 mg/dL — ABNORMAL HIGH (ref 8–23)
CO2: 32 mmol/L (ref 22–32)
Calcium: 8.8 mg/dL — ABNORMAL LOW (ref 8.9–10.3)
Chloride: 88 mmol/L — ABNORMAL LOW (ref 98–111)
Creatinine, Ser: 1 mg/dL (ref 0.44–1.00)
GFR, Estimated: >60 mL/min (ref >60)
Glucose, Bld: 108 mg/dL — ABNORMAL HIGH (ref 70–99)
Potassium: 2.3 mmol/L — CL (ref 3.5–5.1)
Sodium: 137 mmol/L (ref 135–145)
Total Bilirubin: 1.7 mg/dL — ABNORMAL HIGH (ref 0.3–1.2)
Total Protein: 6.5 g/dL (ref 6.5–8.1)

## 2020-12-12 LAB — MAGNESIUM: Magnesium: 2 mg/dL (ref 1.7–2.4)

## 2020-12-12 MED ORDER — FAMOTIDINE 20 MG IN NS 100 ML IVPB
20.0000 mg | Freq: Once | INTRAVENOUS | Status: AC
  Administered 2020-12-12: 20 mg via INTRAVENOUS
  Filled 2020-12-12: qty 20

## 2020-12-12 MED ORDER — HEPARIN SOD (PORK) LOCK FLUSH 100 UNIT/ML IV SOLN
500.0000 [IU] | Freq: Once | INTRAVENOUS | Status: AC | PRN
  Administered 2020-12-12: 500 [IU]
  Filled 2020-12-12: qty 5

## 2020-12-12 MED ORDER — ALUM & MAG HYDROXIDE-SIMETH 200-200-20 MG/5ML PO SUSP
30.0000 mL | Freq: Once | ORAL | Status: AC
  Administered 2020-12-12: 30 mL via ORAL

## 2020-12-12 MED ORDER — OMEPRAZOLE 40 MG PO CPDR: 40.0000 mg | DELAYED_RELEASE_CAPSULE | Freq: Two times a day (BID) | ORAL | 1 refills | Status: AC

## 2020-12-12 MED ORDER — POTASSIUM CHLORIDE CRYS ER 20 MEQ PO TBCR: 40.0000 meq | EXTENDED_RELEASE_TABLET | Freq: Two times a day (BID) | ORAL | 2 refills | Status: AC

## 2020-12-12 MED ORDER — SODIUM CHLORIDE 0.9 % IV SOLN: Freq: Once | INTRAVENOUS | Status: DC

## 2020-12-12 MED ORDER — SODIUM CHLORIDE 0.9 % IV SOLN
Freq: Once | INTRAVENOUS | Status: AC
  Filled 2020-12-12: qty 250

## 2020-12-12 MED ORDER — POTASSIUM CHLORIDE 20 MEQ/100ML IV SOLN
20.0000 meq | Freq: Once | INTRAVENOUS | Status: AC
  Administered 2020-12-12: 20 meq via INTRAVENOUS

## 2020-12-12 MED ORDER — ONDANSETRON HCL 8 MG PO TABS: ORAL_TABLET | ORAL | 1 refills | Status: AC

## 2020-12-12 MED ORDER — DEXAMETHASONE SODIUM PHOSPHATE 10 MG/ML IJ SOLN
10.0000 mg | Freq: Once | INTRAMUSCULAR | Status: AC
  Administered 2020-12-12: 10 mg via INTRAVENOUS
  Filled 2020-12-12: qty 1

## 2020-12-12 MED ORDER — IOHEXOL 300 MG/ML  SOLN
100.0000 mL | Freq: Once | INTRAMUSCULAR | Status: AC | PRN
  Administered 2020-12-12: 100 mL via INTRAVENOUS

## 2020-12-12 MED ORDER — SUCRALFATE 1 GM/10ML PO SUSP: 1.0000 g | Freq: Three times a day (TID) | ORAL | 0 refills | Status: DC

## 2020-12-12 MED ORDER — ONDANSETRON HCL 4 MG/2ML IJ SOLN
8.0000 mg | Freq: Once | INTRAMUSCULAR | Status: AC
  Administered 2020-12-12: 8 mg via INTRAVENOUS
  Filled 2020-12-12: qty 4

## 2020-12-12 MED ORDER — SODIUM CHLORIDE 0.9% FLUSH
10.0000 mL | Freq: Once | INTRAVENOUS | Status: AC | PRN
  Administered 2020-12-12: 10 mL
  Filled 2020-12-12: qty 10

## 2020-12-12 NOTE — Patient Instructions (Signed)
Potassium Chloride Injection °What is this medication? °POTASSIUM CHLORIDE (poe TASS i um KLOOR ide) prevents and treats low levels of potassium in your body. Potassium plays an important role in maintaining the health of your kidneys, heart, muscles, and nervous system. °This medicine may be used for other purposes; ask your health care provider or pharmacist if you have questions. °COMMON BRAND NAME(S): PROAMP °What should I tell my care team before I take this medication? °They need to know if you have any of these conditions: °Addison disease °Dehydration °Diabetes (high blood sugar) °Heart disease °High levels of potassium in the blood °Irregular heartbeat or rhythm °Kidney disease °Large areas of burned skin °An unusual or allergic reaction to potassium, other medications, foods, dyes, or preservatives °Pregnant or trying to get pregnant °Breast-feeding °How should I use this medication? °This medication is injected into a vein. It is given in a hospital or clinic setting. °Talk to your care team about the use of this medication in children. Special care may be needed. °Overdosage: If you think you have taken too much of this medicine contact a poison control center or emergency room at once. °NOTE: This medicine is only for you. Do not share this medicine with others. °What if I miss a dose? °This does not apply. This medication is not for regular use. °What may interact with this medication? °Do not take this medication with any of the following: °Certain diuretics such as spironolactone, triamterene °Eplerenone °Sodium polystyrene sulfonate °This medication may also interact with the following: °Certain medications for blood pressure or heart disease like lisinopril, losartan, quinapril, valsartan °Medications that lower your chance of fighting infection such as cyclosporine, tacrolimus °NSAIDs, medications for pain and inflammation, like ibuprofen or naproxen °Other potassium supplements °Salt  substitutes °This list may not describe all possible interactions. Give your health care provider a list of all the medicines, herbs, non-prescription drugs, or dietary supplements you use. Also tell them if you smoke, drink alcohol, or use illegal drugs. Some items may interact with your medicine. °What should I watch for while using this medication? °Visit your care team for regular checks on your progress. Tell your care team if your symptoms do not start to get better or if they get worse. °You may need blood work while you are taking this medication. °Avoid salt substitutes unless you are told otherwise by your care team. °What side effects may I notice from receiving this medication? °Side effects that you should report to your care team as soon as possible: °Allergic reactions--skin rash, itching, hives, swelling of the face, lips, tongue, or throat °High potassium level--muscle weakness, fast or irregular heartbeat °Side effects that usually do not require medical attention (report to your care team if they continue or are bothersome): °Diarrhea °Nausea °Stomach pain °Vomiting °This list may not describe all possible side effects. Call your doctor for medical advice about side effects. You may report side effects to FDA at 1-800-FDA-1088. °Where should I keep my medication? °This medication is given in a hospital or clinic. It will not be stored at home. °NOTE: This sheet is a summary. It may not cover all possible information. If you have questions about this medicine, talk to your doctor, pharmacist, or health care provider. °© 2022 [employer] (2020-04-08 00:00:00) ° °

## 2020-12-12 NOTE — Progress Notes (Signed)
Symptom Management [place]  Telephone:([phone]) [phone] Fax:([phone]) [phone]  Patient Care Team: Danelle Berry, NP as PCP - General (Nurse Practitioner) Clent Jacks, RN as Oncology Nurse Navigator Cammie Sickle, MD as Consulting Physician (Internal Medicine) Gillis Ends, MD as Referring Physician (Obstetrics)   Name of the patient: Jeanette Allen  [id number]  August 24, 1955   Date of visit: 12/12/20  Reason for Consult:  [name] is a 65 y.o. 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  Patient was hospitalized several times in October 2022 at [facility] and [place] for recurrent SBO.  She is not felt to be a surgical candidate and venting G-tube was placed on 10/20/2020 for symptom relief.   Patient saw Dr. Rogue Bussing on 12/08/2020 at which time prognosis was reviewed.  Patient is felt to have very low chance of meaningful improvement on immunotherapy.  She remains quite symptomatic with evidence of clinical progression.  CT is pending.  Patient presents to [facility] today for supportive care with labs/fluids.  She has had several days of persistent nausea and vomiting.  She feels burning in her throat/chest when she tries to drink following vomiting.  She is using her venting PEG several times a day and that helps some.  She is not currently taking antiemetics other than her nightly olanzapine as she ran out of ondansetron.  Oral intake is minimal.  Patient has had slowed output from her ostomy.  Denies any neurologic complaints. Denies recent fevers or illnesses. Denies any easy bleeding or bruising. Denies chest pain. Denies any vomiting, constipation, or diarrhea. Denies urinary complaints. Patient offers no further specific complaints  today.  PAST MEDICAL HISTORY: Past Medical History:  Diagnosis Date   Arthritis    Cancer ([place])    Hypertension     PAST SURGICAL HISTORY:  Past Surgical History:  Procedure Laterality Date   APPENDECTOMY     COLONOSCOPY WITH PROPOFOL N/A 10/01/2019   Procedure: COLONOSCOPY WITH PROPOFOL;  Surgeon: Lin Landsman, MD;  Location: [facility] ENDOSCOPY;  Service: Gastroenterology;  Laterality: N/A;   COLOSTOMY Right 2021   ESOPHAGOGASTRODUODENOSCOPY (EGD) WITH PROPOFOL N/A 09/12/2019   Procedure: ESOPHAGOGASTRODUODENOSCOPY (EGD) WITH PROPOFOL;  Surgeon: Lin Landsman, MD;  Location: [place];  Service: Gastroenterology;  Laterality: N/A;   GASTROSTOMY TUBE PLACEMENT Left 10/2020   IR IMAGING GUIDED PORT INSERTION  11/21/2020    HEMATOLOGY/ONCOLOGY HISTORY:  Oncology History Overview Note  #August 2021-ascitic fluid cytology-high-grade serous carcinoma of gynecologic origin; CT scan-behind the abdominal wall 4.6 x 2.1 mass ; within the mesentery 6.7 x 4.5 cm left of mid abdomen . BIOPSY of the peritoneal mass; high grade carcinoma [phone] [Dr.Byrnett]; SEP 9th 2021- PET scan-shows peritoneal carcinomatosis; uterine uptake [Dr.Secord; unable to biopsy cervical stenosis] sigmoid colonic uptake concerning for malignancy.  No ovarian uptake.  CEA 125 +1300; CEA-2.    #Hepatitis C/ectopic pregnancy/PRBC transfusion 1995-untreated.[Dr.Vanga]  # 09/14/2019-neoadjuvant CARBO-TAXOL s/p #4 cycles-partial response; DEC 9th 2021-laparoscopy-surgery aborted given the need for multiple bowel resections; DEc 22nd, 2021-proceed with neoadjuvant carbotaxol; STARTING cycle #6- AUC-5 sec to persistent thrombocytopenia.  #Status post #7 CarboTaxol -March 2022-debulking surgery TAH/BSO; bowel resections; ileostomy/mucous fistula.  # April 13th-restart CarboTaxol No. 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 GENETICS- NEG; HRD-    #  PALLIATIVE CARE EVALUATION:  # PAIN MANAGEMENT:    DIAGNOSIS: Uterine cancer  STAGE:   IV      ;  GOALS: control  CURRENT/MOST RECENT THERAPY : Carbotaxol    Uterine cancer ([place]) (Resolved)  09/04/2019 Initial Diagnosis   Gynecologic cancer [facility])   Endometrial cancer ([place])  09/14/2019 - 06/12/2020 Chemotherapy   Patient is on Treatment Plan : OVARIAN Carboplatin (AUC 6) / Paclitaxel (175) q21d x 6 cycles     10/05/2019 Initial Diagnosis   Endometrial cancer ([facility])    Genetic Testing   Negative germline genetic testing. No pathogenic variants identified on the [facility] panel. The report date is 10/01/2019. HRD testing was ordered but cancelled due to not enough sample/endometrial diagnosis.   The [facility] gene panel offered by [employer] includes sequencing and deletion/duplication testing of the following 35 genes: APC, ATM, AXIN2, BARD1, BMPR1A, BRCA1, BRCA2, BRIP1, CHD1, CDK4, CDKN2A, CHEK2, EPCAM (large rearrangement only), HOXB13, GALNT12, MLH1, MSH2, MSH3, MSH6, MUTYH, NBN, NTHL1, PALB2, PMS2, PTEN, RAD51C, RAD51D, RNF43, RPS20, SMAD4, STK11, and TP53. Sequencing was performed for select regions of POLE and POLD1, and large rearrangement analysis was performed for select regions of GREM1.    01/16/2020 Cancer Staging   Staging form: Corpus Uteri - Carcinoma and Carcinosarcoma, AJCC 8th Edition - Clinical: Stage IVB (cM1) - Signed by Cammie Sickle, MD on 01/16/2020   10/15/2020 -  Chemotherapy   Patient is on Treatment Plan : UTERINE Lenvatinib + Pembrolizumab q21d       ALLERGIES:  has No Known Allergies.  MEDICATIONS:  Current Outpatient Medications  Medication Sig Dispense Refill   acetaminophen (TYLENOL) 500 MG tablet Take 1 tablet (500 mg total) by mouth every 6 (six) hours as needed for mild pain, fever or headache (or Fever >/= 101). 30 tablet 0   ergocalciferol (VITAMIN  D2) 1.25 MG (50000 UT) capsule Take 50,000 Units by mouth every Monday.     FLUoxetine (PROZAC) 40 MG capsule TAKE 1 CAPSULE  (40 MG TOTAL ) BY MOUTH DAILY 30 capsule 3   lidocaine-prilocaine (EMLA) cream Apply 1 application topically as needed. Apply to port and cover with saran wrap 1-2 hours prior to port access 30 g 1   LORazepam (ATIVAN) 0.5 MG tablet Take 1 tablet (0.5 mg total) by mouth every 8 (eight) hours as needed for anxiety. /nausea. 60 tablet 0   OLANZapine (ZYPREXA) 10 MG tablet Take 1 tablet (10 mg total) by mouth at bedtime. 30 tablet 2   omeprazole (PRILOSEC) 40 MG capsule Take 1 capsule (40 mg total) by mouth 2 (two) times daily before a meal. 60 capsule 1   ondansetron (ZOFRAN) 8 MG tablet One pill every 8 hours as needed for nausea/vomitting. 60 tablet 1   potassium chloride SA (KLOR-CON M) 20 MEQ tablet Take 20 mEq by mouth 2 (two) times daily.     traZODone (DESYREL) 50 MG tablet Take 1 tablet by mouth at bedtime as needed.     No current facility-administered medications for this visit.   Facility-Administered Medications Ordered in Other Visits  Medication Dose Route Frequency Provider Last Rate Last Admin   heparin lock flush 100 unit/mL  500 Units Intravenous Once Charlaine Dalton R, MD       heparin lock flush 100 unit/mL  500 Units Intracatheter Once PRN Cammie Sickle, MD       potassium chloride 20 mEq in 100 mL IVPB  20 mEq Intravenous Once Cashawn Yanko, Kirt Boys, NP  potassium chloride 20 mEq in 100 mL IVPB  20 mEq Intravenous Once ,  R, NP 100 mL/hr at 12/12/20 1043 20 mEq at 12/12/20 1043   sodium chloride flush (NS) 0.9 % injection 10 mL  10 mL Intravenous PRN Cammie Sickle, MD   10 mL at 12/08/20 1037    VITAL SIGNS: BP 120/69   Pulse (!) 119   Temp (!) 96.9 F (36.1 C)   Resp 17   SpO2 100%  There were no vitals filed for this visit.  Estimated body mass index is 22.55 kg/m as calculated from the following:   Height  as of 11/21/20: 5' 4" (1.626 m).   Weight as of 12/08/20: 131 lb 6.4 oz (59.6 kg).  LABS: CBC:    Component Value Date/Time   WBC 7.6 12/12/2020 0912   HGB 11.3 (L) 12/12/2020 0912   HCT 34.4 (L) 12/12/2020 0912   PLT 186 12/12/2020 0912   MCV 104.6 (H) 12/12/2020 0912   NEUTROABS 6.3 12/12/2020 0912   LYMPHSABS 0.8 12/12/2020 0912   MONOABS 0.5 12/12/2020 0912   EOSABS 0.0 12/12/2020 0912   BASOSABS 0.0 12/12/2020 0912   Comprehensive Metabolic Panel:    Component Value Date/Time   NA 137 12/12/2020 0912   K 2.3 (LL) 12/12/2020 0912   CL 88 (L) 12/12/2020 0912   CO2 32 12/12/2020 0912   BUN 37 (H) 12/12/2020 0912   CREATININE 1.00 12/12/2020 0912   GLUCOSE 108 (H) 12/12/2020 0912   CALCIUM 8.8 (L) 12/12/2020 0912   AST 85 (H) 12/12/2020 0912   ALT 75 (H) 12/12/2020 0912   ALKPHOS 64 12/12/2020 0912   BILITOT 1.7 (H) 12/12/2020 0912   PROT 6.5 12/12/2020 0912   ALBUMIN 3.0 (L) 12/12/2020 0912    RADIOGRAPHIC STUDIES: IR IMAGING GUIDED PORT INSERTION  Addendum Date: 11/21/2020   ADDENDUM REPORT: 11/21/2020 14:20 ADDENDUM: As there was difficulty advancing the microwire centrally, 10 cc of Omnipaque 300 was administered via the micropuncture sheath demonstrating tortuosity but patency of the central aspect of the right internal jugular vein, facilitating access to the central venous system. Electronically Signed   By: Sandi Mariscal M.D.   On: 11/21/2020 14:20   Result Date: 11/21/2020 INDICATION: History of metastatic endometrial cancer. In need of durable intravenous access for the administration of chemotherapy. EXAM: IMPLANTED PORT A CATH PLACEMENT WITH ULTRASOUND AND FLUOROSCOPIC GUIDANCE COMPARISON:  PET-CT-08/25/2020 MEDICATIONS: None ANESTHESIA/SEDATION: Moderate (conscious) sedation was employed during this procedure as administered by the Interventional Radiology RN. A total of Versed 2 mg and Fentanyl 100 mcg was administered intravenously. Moderate Sedation Time: 35  minutes. The patient's level of consciousness and vital signs were monitored continuously by radiology nursing throughout the procedure under my direct supervision. CONTRAST:  None FLUOROSCOPY TIME:  7 minutes, 18 seconds (34.2 mGy) COMPLICATIONS: None immediate. PROCEDURE: The procedure, risks, benefits, and alternatives were explained to the patient. Questions regarding the procedure were encouraged and answered. The patient understands and consents to the procedure. The right neck and chest were prepped with chlorhexidine in a sterile fashion, and a sterile drape was applied covering the operative field. Maximum barrier sterile technique with sterile gowns and gloves were used for the procedure. A timeout was performed prior to the initiation of the procedure. Local anesthesia was provided with 1% lidocaine with epinephrine. After creating a small venotomy incision, a micropuncture kit was utilized to access the internal jugular vein. Real-time ultrasound guidance was utilized for vascular access including the acquisition  of a permanent ultrasound image documenting patency of the accessed vessel. The microwire was utilized to measure appropriate catheter length. A subcutaneous port pocket was then created along the upper chest wall utilizing a combination of sharp and blunt dissection. The pocket was irrigated with sterile saline. A single lumen slim sized power injectable port was chosen for placement. The 8 Fr catheter was tunneled from the port pocket site to the venotomy incision. The port was placed in the pocket. The external catheter was trimmed to appropriate length. At the venotomy, an 8 Fr peel-away sheath was placed over a guidewire under fluoroscopic guidance. The catheter was then placed through the sheath and the sheath was removed. Final catheter positioning was confirmed and documented with a fluoroscopic spot radiograph. The port was accessed with a Huber needle, aspirated and flushed with  heparinized saline. The venotomy site was closed with an interrupted 4-0 Vicryl suture. The port pocket incision was closed with interrupted 2-0 Vicryl suture. Dermabond and Steri-strips were applied to both incisions. Dressings were applied. The patient tolerated the procedure well without immediate post procedural complication. FINDINGS: After catheter placement, the tip lies within the superior cavoatrial junction. The catheter aspirates and flushes normally and is ready for immediate use. IMPRESSION: Successful placement of a right internal jugular approach power injectable Port-A-Cath. The catheter is ready for immediate use. Electronically Signed: By: Sandi Mariscal M.D. On: 11/21/2020 13:21    PERFORMANCE STATUS (ECOG) : 2 - Symptomatic, <50% confined to bed  Review of Systems Unless otherwise noted, a complete review of systems is negative.  Physical Exam General: NAD Cardiovascular: regular rate and rhythm Pulmonary: clear anterior/posterior fields Abdomen: soft, nontender, + bowel sounds, PEG in place, ileostomy GU: no suprapubic tenderness Extremities: no edema, no joint deformities Skin: no rashes Neurological: Weakness but otherwise nonfocal  Assessment and Plan- Patient is a 65 y.o. 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  Patient was hospitalized several times in October 2022 at [facility] and [facility] for recurrent SBO.  She is not felt to be a surgical candidate and venting G-tube was placed on 10/20/2020 for symptom relief.  Patient presents to [facility] today for follow-up labs/fluids  Stage IV high-grade endometrial cancer -evidence of clinical progression.  Discussed with Dr. Rogue Bussing who requested patient be added onto GYN oncology scheduled next week (discussed with Beckey Rutter, NP).   Patient would likely benefit from hospice involvement.  However, will see if we can expedite obtaining CT to help with decision-making.  Hypokalemia -proceed with IV KCl 40 mEq along with IV fluids today.  Patient says she did not take her oral KCl yesterday.  We will increase dose of supplementation.  Nausea -restart scheduled ondansetron.  Continue olanzapine.  Continue venting PEG as needed.  Esophagitis -suspected from vomiting.  We will give IV Pepcid today and restart PPI/Carafate  We will plan to bring patient back next week for fluids/labs/probable K    Patient expressed understanding and was in agreement with this plan. She also understands that She can call clinic at any time with any questions, concerns, or complaints.   Thank you for allowing me to participate in the care of this very pleasant patient.   Time Total: 20 minutes  Visit consisted of counseling and education dealing with the complex and emotionally intense issues of symptom management in the setting of serious illness.Greater  than 50%  of this time was spent counseling and coordinating care related to the above assessment and plan.  Signed by: Altha Harm, PhD, NP-C

## 2020-12-12 NOTE — Progress Notes (Signed)
Discussed meds and when to use the PRN meds especially her zofran . Gave her a new print out of her updated med list. Emotional support given. Received IVFs and supportive meds today. VSS at time of discharge to home. Informed husband of CT appt for this afternoon @ 3:15 pm [facility]. RTC Monday at 9am for lab/fluids possible potassium.

## 2020-12-12 NOTE — Addendum Note (Signed)
Addended by: Altha Harm R on: 12/12/2020 11:50 AM   Modules accepted: Orders

## 2020-12-12 NOTE — Progress Notes (Signed)
Having epigastric pain when she tries to swallow liquids, she thinks it is from vomiting so much. For 3 days she has N/V day and night. She states she doesn't think she can keep taking Keytruda.

## 2020-12-15 ENCOUNTER — Other Ambulatory Visit: Payer: Self-pay

## 2020-12-15 ENCOUNTER — Inpatient Hospital Stay: Payer: Medicare Other

## 2020-12-15 ENCOUNTER — Inpatient Hospital Stay (HOSPITAL_BASED_OUTPATIENT_CLINIC_OR_DEPARTMENT_OTHER): Payer: Medicare Other | Admitting: Hospice and Palliative Medicine

## 2020-12-15 VITALS — BP 110/66 | HR 107 | Temp 97.1°F | Resp 17

## 2020-12-15 VITALS — BP 119/72 | HR 98 | Temp 97.1°F | Resp 16

## 2020-12-15 DIAGNOSIS — E860 Dehydration: Secondary | ICD-10-CM

## 2020-12-15 DIAGNOSIS — R112 Nausea with vomiting, unspecified: Secondary | ICD-10-CM | POA: Diagnosis not present

## 2020-12-15 DIAGNOSIS — E876 Hypokalemia: Secondary | ICD-10-CM

## 2020-12-15 DIAGNOSIS — Z87891 Personal history of nicotine dependence: Secondary | ICD-10-CM

## 2020-12-15 DIAGNOSIS — C541 Malignant neoplasm of endometrium: Secondary | ICD-10-CM | POA: Diagnosis not present

## 2020-12-15 DIAGNOSIS — R110 Nausea: Secondary | ICD-10-CM

## 2020-12-15 DIAGNOSIS — Z515 Encounter for palliative care: Secondary | ICD-10-CM

## 2020-12-15 DIAGNOSIS — Z5112 Encounter for antineoplastic immunotherapy: Secondary | ICD-10-CM | POA: Diagnosis not present

## 2020-12-15 LAB — BASIC METABOLIC PANEL
Anion gap: 13 (ref 5–15)
BUN: 34 mg/dL — ABNORMAL HIGH (ref 8–23)
CO2: 27 mmol/L (ref 22–32)
Calcium: 8.6 mg/dL — ABNORMAL LOW (ref 8.9–10.3)
Chloride: 94 mmol/L — ABNORMAL LOW (ref 98–111)
Creatinine, Ser: 1.01 mg/dL — ABNORMAL HIGH (ref 0.44–1.00)
GFR, Estimated: >60 mL/min (ref >60)
Glucose, Bld: 84 mg/dL (ref 70–99)
Potassium: 3.1 mmol/L — ABNORMAL LOW (ref 3.5–5.1)
Sodium: 134 mmol/L — ABNORMAL LOW (ref 135–145)

## 2020-12-15 MED ORDER — SODIUM CHLORIDE 0.9 % IV SOLN: Freq: Once | INTRAVENOUS | Status: DC

## 2020-12-15 MED ORDER — ONDANSETRON HCL 4 MG/2ML IJ SOLN
8.0000 mg | Freq: Once | INTRAMUSCULAR | Status: AC
  Administered 2020-12-15: 8 mg via INTRAVENOUS

## 2020-12-15 MED ORDER — HEPARIN SOD (PORK) LOCK FLUSH 100 UNIT/ML IV SOLN
500.0000 [IU] | Freq: Once | INTRAVENOUS | Status: AC | PRN
  Administered 2020-12-15: 500 [IU]
  Filled 2020-12-15: qty 5

## 2020-12-15 MED ORDER — FAMOTIDINE 20 MG IN NS 100 ML IVPB
20.0000 mg | Freq: Once | INTRAVENOUS | Status: AC
  Administered 2020-12-15: 20 mg via INTRAVENOUS
  Filled 2020-12-15: qty 100
  Filled 2020-12-15: qty 20

## 2020-12-15 MED ORDER — POTASSIUM CHLORIDE 20 MEQ/100ML IV SOLN
20.0000 meq | Freq: Once | INTRAVENOUS | Status: AC
  Administered 2020-12-15: 20 meq via INTRAVENOUS

## 2020-12-15 MED ORDER — SODIUM CHLORIDE 0.9% FLUSH
10.0000 mL | Freq: Once | INTRAVENOUS | Status: AC | PRN
  Administered 2020-12-15: 10 mL
  Filled 2020-12-15: qty 10

## 2020-12-15 MED ORDER — DEXAMETHASONE SODIUM PHOSPHATE 10 MG/ML IJ SOLN
10.0000 mg | Freq: Once | INTRAMUSCULAR | Status: AC
  Administered 2020-12-15: 10 mg via INTRAVENOUS

## 2020-12-15 MED ORDER — SODIUM CHLORIDE 0.9 % IV SOLN
Freq: Once | INTRAVENOUS | Status: AC
  Filled 2020-12-15: qty 250

## 2020-12-15 NOTE — Progress Notes (Signed)
[facility]  [phone] [phone]   Name: Jeanette Allen Date: 12/15/2020 MRN: [id number]  DOB: Jan 12, 1955  Patient Care Team: Danelle Berry, NP as PCP - General (Nurse Practitioner) Clent Jacks, RN as Oncology Nurse Navigator Cammie Sickle, MD as Consulting Physician (Internal Medicine) Gillis Ends, MD as Referring Physician (Obstetrics)    REASON FOR CONSULTATION: Jeanette Allen is a 65 y.o. 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  Patient was hospitalized several times in October 2022 at [facility] and [facility] for recurrent SBO.  She is not felt to be a surgical candidate and venting G-tube was placed on 10/20/2020 for symptom relief.  Palliative care was consulted help address goals.  SOCIAL HISTORY:     reports that she has quit smoking. Her smoking use included cigarettes. She has never used smokeless tobacco. She reports that she does not currently use alcohol. She reports that she does not use drugs.  Patient is married and lives at home with her husband.  She was her husband's primary caregiver as he is an amputee.  She has a daughter who is involved.  Patient also has a son who lives nearby and another son in [place].  Patient formally worked in a [employer].  ADVANCE DIRECTIVES:  Not on file  CODE STATUS:   PAST MEDICAL HISTORY: Past Medical History:  Diagnosis Date   Arthritis    Cancer ([place])    Hypertension     PAST SURGICAL HISTORY:  Past Surgical History:  Procedure Laterality Date   APPENDECTOMY     COLONOSCOPY WITH PROPOFOL N/A 10/01/2019   Procedure: COLONOSCOPY WITH PROPOFOL;  Surgeon: Lin Landsman, MD;  Location: [facility] ENDOSCOPY;  Service:  Gastroenterology;  Laterality: N/A;   COLOSTOMY Right 2021   ESOPHAGOGASTRODUODENOSCOPY (EGD) WITH PROPOFOL N/A 09/12/2019   Procedure: ESOPHAGOGASTRODUODENOSCOPY (EGD) WITH PROPOFOL;  Surgeon: Lin Landsman, MD;  Location: [facility];  Service: Gastroenterology;  Laterality: N/A;   GASTROSTOMY TUBE PLACEMENT Left 10/2020   IR IMAGING GUIDED PORT INSERTION  11/21/2020    HEMATOLOGY/ONCOLOGY HISTORY:  Oncology History Overview Note  #August 2021-ascitic fluid cytology-high-grade serous carcinoma of gynecologic origin; CT scan-behind the abdominal wall 4.6 x 2.1 mass ; within the mesentery 6.7 x 4.5 cm left of mid abdomen . BIOPSY of the peritoneal mass; high grade carcinoma ca-[phone] [Dr.Byrnett]; SEP 9th 2021- PET scan-shows peritoneal carcinomatosis; uterine uptake [Dr.Secord; unable to biopsy cervical stenosis] sigmoid colonic uptake concerning for malignancy.  No ovarian uptake.  CEA 125 +1300; CEA-2.    #Hepatitis C/ectopic pregnancy/PRBC transfusion 1995-untreated.[Dr.Vanga]  # 09/14/2019-neoadjuvant CARBO-TAXOL s/p #4 cycles-partial response; DEC 9th 2021-laparoscopy-surgery aborted given the need for multiple bowel resections; DEc 22nd, 2021-proceed with neoadjuvant carbotaxol; STARTING cycle #6- AUC-5 sec to persistent thrombocytopenia.  #Status post #7 CarboTaxol -March 2022-debulking surgery TAH/BSO; bowel resections; ileostomy/mucous fistula.  # April 13th-restart CarboTaxol No. 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 GENETICS- NEG; HRD-    # PALLIATIVE CARE EVALUATION:  # PAIN MANAGEMENT:    DIAGNOSIS: Uterine cancer  STAGE:   IV      ;  GOALS: control  CURRENT/MOST RECENT THERAPY : Carbotaxol    Uterine cancer ([place]) (Resolved)  09/04/2019 Initial Diagnosis   Gynecologic cancer (  HCC)   Endometrial cancer (HCC)  09/14/2019 - 06/12/2020  Chemotherapy   Patient is on Treatment Plan : OVARIAN Carboplatin (AUC 6) / Paclitaxel (175) q21d x 6 cycles     10/05/2019 Initial Diagnosis   Endometrial cancer (HCC)    Genetic Testing   Negative germline genetic testing. No pathogenic variants identified on the [facility] MyRisk panel. The report date is 10/01/2019. HRD testing was ordered but cancelled due to not enough sample/endometrial diagnosis.   The MyRisk gene panel offered by [facility] includes sequencing and deletion/duplication testing of the following 35 genes: APC, ATM, AXIN2, BARD1, BMPR1A, BRCA1, BRCA2, BRIP1, CHD1, CDK4, CDKN2A, CHEK2, EPCAM (large rearrangement only), HOXB13, GALNT12, MLH1, MSH2, MSH3, MSH6, MUTYH, NBN, NTHL1, PALB2, PMS2, PTEN, RAD51C, RAD51D, RNF43, RPS20, SMAD4, STK11, and TP53. Sequencing was performed for select regions of POLE and POLD1, and large rearrangement analysis was performed for select regions of GREM1.    01/16/2020 Cancer Staging   Staging form: Corpus Uteri - Carcinoma and Carcinosarcoma, AJCC 8th Edition - Clinical: Stage IVB (cM1) - Signed by Brahmanday, Govinda R, MD on 01/16/2020   10/15/2020 -  Chemotherapy   Patient is on Treatment Plan : UTERINE Lenvatinib + Pembrolizumab q21d       ALLERGIES:  has No Known Allergies.  MEDICATIONS:  Current Outpatient Medications  Medication Sig Dispense Refill   acetaminophen (TYLENOL) 500 MG tablet Take 1 tablet (500 mg total) by mouth every 6 (six) hours as needed for mild pain, fever or headache (or Fever >/= 101). 30 tablet 0   ergocalciferol (VITAMIN D2) 1.25 MG (50000 UT) capsule Take 50,000 Units by mouth every Monday.     FLUoxetine (PROZAC) 40 MG capsule TAKE 1 CAPSULE  (40 MG TOTAL ) BY MOUTH DAILY 30 capsule 3   lidocaine-prilocaine (EMLA) cream Apply 1 application topically as needed. Apply to port and cover with saran wrap 1-2 hours prior to port access 30 g 1   LORazepam (ATIVAN) 0.5 MG tablet Take 1 tablet (0.5 mg  total) by mouth every 8 (eight) hours as needed for anxiety. /nausea. 60 tablet 0   OLANZapine (ZYPREXA) 10 MG tablet Take 1 tablet (10 mg total) by mouth at bedtime. 30 tablet 2   omeprazole (PRILOSEC) 40 MG capsule Take 1 capsule (40 mg total) by mouth 2 (two) times daily before a meal. 60 capsule 1   ondansetron (ZOFRAN) 8 MG tablet One pill every 8 hours as needed for nausea/vomitting. 60 tablet 1   potassium chloride SA (KLOR-CON M) 20 MEQ tablet Take 2 tablets (40 mEq total) by mouth 2 (two) times daily. 30 tablet 2   sucralfate (CARAFATE) 1 GM/10ML suspension Take 10 mLs (1 g total) by mouth 4 (four) times daily -  with meals and at bedtime. 420 mL 0   traZODone (DESYREL) 50 MG tablet Take 1 tablet by mouth at bedtime as needed.     No current facility-administered medications for this visit.   Facility-Administered Medications Ordered in Other Visits  Medication Dose Route Frequency Provider Last Rate Last Admin   heparin lock flush 100 unit/mL  500 Units Intravenous Once Brahmanday, Govinda R, MD       heparin lock flush 100 unit/mL  500 Units Intracatheter Once PRN Brahmanday, Govinda R, MD       sodium chloride flush (NS) 0.9 % injection 10 mL  10 mL Intravenous PRN Brahmanday, Govinda R, MD   10 mL at 12/08/20 1037    VITAL SIGNS: There   were no vitals taken for this visit. There were no vitals filed for this visit.  Estimated body mass index is 22.55 kg/m as calculated from the following:   Height as of 11/21/20: 5' 4" (1.626 m).   Weight as of 12/08/20: 131 lb 6.4 oz (59.6 kg).  LABS: CBC:    Component Value Date/Time   WBC 7.6 12/12/2020 0912   HGB 11.3 (L) 12/12/2020 0912   HCT 34.4 (L) 12/12/2020 0912   PLT 186 12/12/2020 0912   MCV 104.6 (H) 12/12/2020 0912   NEUTROABS 6.3 12/12/2020 0912   LYMPHSABS 0.8 12/12/2020 0912   MONOABS 0.5 12/12/2020 0912   EOSABS 0.0 12/12/2020 0912   BASOSABS 0.0 12/12/2020 0912   Comprehensive Metabolic Panel:    Component Value  Date/Time   NA 137 12/12/2020 0912   K 2.3 (LL) 12/12/2020 0912   CL 88 (L) 12/12/2020 0912   CO2 32 12/12/2020 0912   BUN 37 (H) 12/12/2020 0912   CREATININE 1.00 12/12/2020 0912   GLUCOSE 108 (H) 12/12/2020 0912   CALCIUM 8.8 (L) 12/12/2020 0912   AST 85 (H) 12/12/2020 0912   ALT 75 (H) 12/12/2020 0912   ALKPHOS 64 12/12/2020 0912   BILITOT 1.7 (H) 12/12/2020 0912   PROT 6.5 12/12/2020 0912   ALBUMIN 3.0 (L) 12/12/2020 0912    RADIOGRAPHIC STUDIES: CT CHEST ABDOMEN PELVIS W CONTRAST  Result Date: 12/12/2020 CLINICAL DATA:  Metastatic workup, restaging of endometrial cancer diagnosed in July of 2021. History of total abdominal hysterectomy and systemic therapy also with history of partial colonic resection by report. EXAM: CT CHEST, ABDOMEN, AND PELVIS WITH CONTRAST TECHNIQUE: Multidetector CT imaging of the chest, abdomen and pelvis was performed following the standard protocol during bolus administration of intravenous contrast. CONTRAST:  100mL OMNIPAQUE IOHEXOL 300 MG/ML  SOLN COMPARISON:  Comparison is made with imaging from October 11, 2020. FINDINGS: CT CHEST FINDINGS Cardiovascular: RIGHT-sided Port-A-Cath terminates in the RIGHT atrium. Calcified and noncalcified atheromatous plaque throughout the thoracic aorta without signs of aneurysmal dilation. Normal caliber of central pulmonary vasculature unremarkable on venous phase. Normal heart size. No substantial pericardial effusion. Mediastinum/Nodes: No adenopathy in the mediastinum or bilateral hila. Thoracic inlet structures are unremarkable. No axillary lymphadenopathy. Esophagus with moderate to marked thickening of mid and distal esophagus in the setting of hiatal hernia. Lungs/Pleura: Patchy middle lobe and lingular nodularity with ground-glass features. Small LEFT-sided pleural effusion and trace RIGHT-sided effusion. No consolidation. No suspicious pulmonary nodule. Musculoskeletal: See below for full musculoskeletal details. CT  ABDOMEN PELVIS FINDINGS Hepatobiliary: Hepatic steatosis. Perihepatic ascites and generalized ascites in the abdomen. No focal, suspicious hepatic lesion. Gallbladder is collapsed. No biliary duct distension. Portal vein is patent. Pancreas: Normal, without mass, inflammation or ductal dilatation. Spleen: Spleen normal size and contour. Adrenals/Urinary Tract: Adrenal glands are normal. Symmetric renal enhancement. No hydronephrosis. No nephrolithiasis. Urinary bladder is collapsed. Stomach/Bowel: Gastrostomy tube in place. Distortion of numerous bowel loops in the abdomen with intermittent dilation and narrowing. This pattern is quite similar to previous imaging but with less pronounced dilation of bowel loops in the LEFT abdomen. Diverting ostomy in the RIGHT lower quadrant. Gradual narrowing of bowel loops with near complete collapse of bowel loops just proximal to the diverting ostomy. Complete collapse of bowel loops as expected beyond the site of the ostomy. Mild pericolonic stranding may be present. Nodularity adjacent to the transverse colon is similar to previous imaging. Nodule adjacent to the splenic flexure (image 65/2) 2.8 x 1.5 cm previously   2.9 x 1.6 cm. Incontinuity with this soft tissue is less subdiaphragmatic soft tissue measuring 2.0 cm greatest axial dimension, previously 2.0 cm. LEFT lower quadrant colostomy with ascites now in the ostomy defect in the LEFT lower quadrant. Vascular/Lymphatic: Atherosclerotic changes both calcified and noncalcified in the abdominal aorta. No adenopathy in the small bowel mesentery, or pelvis. Celiac nodal enlargement (image 63/2) 12 mm, grossly similar to previous imaging. Just below the gastroesophageal junction is a mildly enlarged lymph node that shows no change measuring 8 mm (image 55/2) subtle nodularity amidst ascites in the hepatic gastric recess (image 63/2) 10 mm no measurable soft tissue in this location previously. Reproductive: Post hysterectomy.  Adjacent rectal pouch. No adnexal masses. Other: Increase in intra-abdominal ascites, moderate increase in intra-abdominal ascites since previous imaging. Nodular thickening of peritoneal reflections and septations and ascites (image 59/2) nodularity lateral to the stomach serves as an example of this process. Nodularity along the splenic flexure of the colon as discussed above also as an example of peritoneal/serosal disease. Suspect extension into the gastrohepatic ligament as described above. Musculoskeletal: No acute bone finding. No destructive bone process. Spinal degenerative changes. IMPRESSION: Moderate to marked thickening of the mid and distal esophagus in the setting of hiatal hernia. Findings are suspicious for esophagitis given segmental thickening. Correlate with any symptoms that would suggest esophagitis or with any repeated nausea and vomiting. Patchy middle lobe and lingular ground-glass nodularity most suggestive of infection, scattered areas seen elsewhere in the chest as well. In the setting of nausea and vomiting would also consider the possibility of repeated microaspiration. Correlate with respiratory symptoms and suggest attention on subsequent imaging. Signs of peritoneal carcinomatosis with increasing intra-abdominal ascites. Suggestion of increased thickening of the gastrohepatic ligament Improved dilation of small bowel since October still with transition in the lower abdomen proximal to the site of diverting ostomy, likely complete versus is high-grade partial small bowel obstruction. Correlate with ostomy output. Interval placement of venting gastrostomy tube. Interval development of small LEFT and trace RIGHT pleural effusions. Aortic atherosclerosis. Aortic Atherosclerosis (ICD10-I70.0). Electronically Signed   By: Zetta Bills M.D.   On: 12/12/2020 16:27   IR IMAGING GUIDED PORT INSERTION  Addendum Date: 11/21/2020   ADDENDUM REPORT: 11/21/2020 14:20 ADDENDUM: As there was  difficulty advancing the microwire centrally, 10 cc of Omnipaque 300 was administered via the micropuncture sheath demonstrating tortuosity but patency of the central aspect of the right internal jugular vein, facilitating access to the central venous system. Electronically Signed   By: Sandi Mariscal M.D.   On: 11/21/2020 14:20   Result Date: 11/21/2020 INDICATION: History of metastatic endometrial cancer. In need of durable intravenous access for the administration of chemotherapy. EXAM: IMPLANTED PORT A CATH PLACEMENT WITH ULTRASOUND AND FLUOROSCOPIC GUIDANCE COMPARISON:  PET-CT-08/25/2020 MEDICATIONS: None ANESTHESIA/SEDATION: Moderate (conscious) sedation was employed during this procedure as administered by the Interventional Radiology RN. A total of Versed 2 mg and Fentanyl 100 mcg was administered intravenously. Moderate Sedation Time: 35 minutes. The patient's level of consciousness and vital signs were monitored continuously by radiology nursing throughout the procedure under my direct supervision. CONTRAST:  None FLUOROSCOPY TIME:  7 minutes, 18 seconds (93.7 mGy) COMPLICATIONS: None immediate. PROCEDURE: The procedure, risks, benefits, and alternatives were explained to the patient. Questions regarding the procedure were encouraged and answered. The patient understands and consents to the procedure. The right neck and chest were prepped with chlorhexidine in a sterile fashion, and a sterile drape was applied covering the operative field. Maximum  barrier sterile technique with sterile gowns and gloves were used for the procedure. A timeout was performed prior to the initiation of the procedure. Local anesthesia was provided with 1% lidocaine with epinephrine. After creating a small venotomy incision, a micropuncture kit was utilized to access the internal jugular vein. Real-time ultrasound guidance was utilized for vascular access including the acquisition of a permanent ultrasound image documenting  patency of the accessed vessel. The microwire was utilized to measure appropriate catheter length. A subcutaneous port pocket was then created along the upper chest wall utilizing a combination of sharp and blunt dissection. The pocket was irrigated with sterile saline. A single lumen slim sized power injectable port was chosen for placement. The 8 Fr catheter was tunneled from the port pocket site to the venotomy incision. The port was placed in the pocket. The external catheter was trimmed to appropriate length. At the venotomy, an 8 Fr peel-away sheath was placed over a guidewire under fluoroscopic guidance. The catheter was then placed through the sheath and the sheath was removed. Final catheter positioning was confirmed and documented with a fluoroscopic spot radiograph. The port was accessed with a Huber needle, aspirated and flushed with heparinized saline. The venotomy site was closed with an interrupted 4-0 Vicryl suture. The port pocket incision was closed with interrupted 2-0 Vicryl suture. Dermabond and Steri-strips were applied to both incisions. Dressings were applied. The patient tolerated the procedure well without immediate post procedural complication. FINDINGS: After catheter placement, the tip lies within the superior cavoatrial junction. The catheter aspirates and flushes normally and is ready for immediate use. IMPRESSION: Successful placement of a right internal jugular approach power injectable Port-A-Cath. The catheter is ready for immediate use. Electronically Signed: By: John  Watts M.D. On: 11/21/2020 13:21    PERFORMANCE STATUS (ECOG) : 2 - Symptomatic, <50% confined to bed  Review of Systems Unless otherwise noted, a complete review of systems is negative.  Physical Exam General: NAD Cardiovascular: regular rate and rhythm Pulmonary: clear ant fields Abdomen: Ostomy with output, venting PEG noted, abdomen soft and nontender GU: no suprapubic tenderness Extremities: no  edema, no joint deformities Skin: no rashes Neurological: Weakness but otherwise nonfocal  IMPRESSION: Patient was an add-on to my clinic schedule today to discuss goals and review results of her recent CT.  Unfortunately, patient is doing poorly.  She continues to have ongoing symptoms of nausea and vomiting.  She has minimal oral intake with frequent utilization of her venting PEG for symptomatic relief.  CT revealed findings suspicious for esophagitis, which is likely clinically related to her recurrent vomiting.  She had patchy middle lobe and lingular groundglass nodularity concerning for infection versus repeated microaspiration nausea and vomiting.  Signs of peritoneal carcinomatosis with increasing intra-abdominal ascites and increased thickening of the gastrohepatic ligament was noted.  She had improved dilation of small bowel but was still with transition in the lower abdomen proximal to the site of diverting ostomy, which was likely complete versus high-grade partial SBO.  She had interval development of small left and trace right pleural effusions.   I spoke with IR who confirmed that peritoneal carcinomatosis appeared worse on CT.  Patient does have area of ascites but this is loculated.  IR felt that it would be amenable to paracentesis if needed.  I spoke with patient, husband, and daughter (via phone) regarding results of CT. we discussed how poorly she is doing in light of what appears to be clinical progression of cancer.  We discussed   the option of focusing on her comfort and involving hospice.  Daughter asked about additional chemotherapy.  Husband stated that he did not want to "give up."  I encouraged him to speak with GYN oncology, which is scheduled on Wednesday.  I encouraged patient and family to consider DNR/DNI.  Symptomatically, she continues to endorse esophageal burning from presumed esophagitis..  Recommend continuing Carafate/PPI.  We will give Pepcid IV today.  We  discussed increasing the frequency that she utilizes the venting PEG.  Patient denies pulmonary symptoms or fever/chills.  She continues to have minimal oral intake.  PLAN: -Continue current scope of treatment -IV fluids/KCl/dexamethasone/Pepcid today -Continue olanzapine 10 mg at bedtime -Recommend scheduled ondansetron -Continue Carafate/PPI -RTC on Wednesday to see GYN oncology and then Friday for fluids/labs   Patient expressed understanding and was in agreement with this plan. She also understands that She can call the clinic at any time with any questions, concerns, or complaints.     Time Total: 35 minutes  Visit consisted of counseling and education dealing with the complex and emotionally intense issues of symptom management and palliative care in the setting of serious and potentially life-threatening illness.Greater than 50%  of this time was spent counseling and coordinating care related to the above assessment and plan.  Signed by: Altha Harm, PhD, NP-C

## 2020-12-15 NOTE — Patient Instructions (Signed)
Dehydration, Adult Dehydration is condition in which there is not enough water or other fluids in the body. This happens when a person loses more fluids than he or she takes in. Important body parts cannot work right without the right amount of fluids. Any loss of fluids from the body can cause dehydration. Dehydration can be mild, worse, or very bad. It should be treated right away to keep it from getting very bad. What are the causes? This condition may be caused by: Conditions that cause loss of water or other fluids, such as: Watery poop (diarrhea). Vomiting. Sweating a lot. Peeing (urinating) a lot. Not drinking enough fluids, especially when you: Are ill. Are doing things that take a lot of energy to do. Other illnesses and conditions, such as fever or infection. Certain medicines, such as medicines that take extra fluid out of the body (diuretics). Lack of safe drinking water. Not being able to get enough water and food. What increases the risk? The following factors may make you more likely to develop this condition: Having a long-term (chronic) illness that has not been treated the right way, such as: Diabetes. Heart disease. Kidney disease. Being 74 years of age or older. Having a disability. Living in a place that is high above the ground or sea (high in altitude). The thinner, dried air causes more fluid loss. Doing exercises that put stress on your body for a long time. What are the signs or symptoms? Symptoms of dehydration depend on how bad it is. Mild or worse dehydration Thirst. Dry lips or dry mouth. Feeling dizzy or light-headed, especially when you stand up from sitting. Muscle cramps. Your body making: Dark pee (urine). Pee may be the color of tea. Less pee than normal. Less tears than normal. Headache. Very bad dehydration Changes in skin. Skin may: Be cold to the touch (clammy). Be blotchy or pale. Not go back to normal right after you lightly pinch  it and let it go. Little or no tears, pee, or sweat. Changes in vital signs, such as: Fast breathing. Low blood pressure. Weak pulse. Pulse that is more than 100 beats a minute when you are sitting still. Other changes, such as: Feeling very thirsty. Eyes that look hollow (sunken). Cold hands and feet. Being mixed up (confused). Being very tired (lethargic) or having trouble waking from sleep. Short-term weight loss. Loss of consciousness. How is this treated? Treatment for this condition depends on how bad it is. Treatment should start right away. Do not wait until your condition gets very bad. Very bad dehydration is an emergency. You will need to go to a hospital. Mild or worse dehydration can be treated at home. You may be asked to: Drink more fluids. Drink an oral rehydration solution (ORS). This drink helps get the right amounts of fluids and salts and minerals in the blood (electrolytes). Very bad dehydration can be treated: With fluids through an IV tube. By getting normal levels of salts and minerals in your blood. This is often done by giving salts and minerals through a tube. The tube is passed through your nose and into your stomach. By treating the root cause. Follow these instructions at home: Oral rehydration solution If told by your doctor, drink an ORS: Make an ORS. Use instructions on the package. Start by drinking small amounts, about  cup (120 mL) every 5-10 minutes. Slowly drink more until you have had the amount that your doctor said to have. Eating and drinking  Drink enough clear fluid to keep your pee pale yellow. If you were told to drink an ORS, finish the ORS first. Then, start slowly drinking other clear fluids. Drink fluids such as: Water. Do not drink only water. Doing that can make the salt (sodium) level in your body get too low. Water from ice chips you suck on. Fruit juice that you have added water to (diluted). Low-calorie sports  drinks. Eat foods that have the right amounts of salts and minerals, such as: Bananas. Oranges. Potatoes. Tomatoes. Spinach. Do not drink alcohol. Avoid: Drinks that have a lot of sugar. These include: High-calorie sports drinks. Fruit juice that you did not add water to. Soda. Caffeine. Foods that are greasy or have a lot of fat or sugar. General instructions Take over-the-counter and prescription medicines only as told by your doctor. Do not take salt tablets. Doing that can make the salt level in your body get too high. Return to your normal activities as told by your doctor. Ask your doctor what activities are safe for you. Keep all follow-up visits as told by your doctor. This is important. Contact a doctor if: You have pain in your belly (abdomen) and the pain: Gets worse. Stays in one place. You have a rash. You have a stiff neck. You get angry or annoyed (irritable) more easily than normal. You are more tired or have a harder time waking than normal. You feel: Weak or dizzy. Very thirsty. Get help right away if you have: Any symptoms of very bad dehydration. Symptoms of vomiting, such as: You cannot eat or drink without vomiting. Your vomiting gets worse or does not go away. Your vomit has blood or green stuff in it. Symptoms that get worse with treatment. A fever. A very bad headache. Problems with peeing or pooping (having a bowel movement), such as: Watery poop that gets worse or does not go away. Blood in your poop (stool). This may cause poop to look black and tarry. Not peeing in 6-8 hours. Peeing only a small amount of very dark pee in 6-8 hours. Trouble breathing. These symptoms may be an emergency. Do not wait to see if the symptoms will go away. Get medical help right away. Call your local emergency services (911 in the [place] Do not drive yourself to the hospital. Summary Dehydration is a condition in which there is not enough water or other fluids  in the body. This happens when a person loses more fluids than he or she takes in. Treatment for this condition depends on how bad it is. Treatment should be started right away. Do not wait until your condition gets very bad. Drink enough clear fluid to keep your pee pale yellow. If you were told to drink an oral rehydration solution (ORS), finish the ORS first. Then, start slowly drinking other clear fluids. Take over-the-counter and prescription medicines only as told by your doctor. Get help right away if you have any symptoms of very bad dehydration. This information is not intended to replace advice given to you by your health care provider. Make sure you discuss any questions you have with your health care provider. Document Revised: 08/03/2018 Document Reviewed: 08/03/2018 Elsevier Patient Education  [place]. Potassium Chloride Injection What is this medication? POTASSIUM CHLORIDE (poe TASS i um KLOOR ide) prevents and treats low levels of potassium in your body. Potassium plays an important role in maintaining the health of your kidneys, heart, muscles, and nervous system. This medicine may be  used for other purposes; ask your health care provider or pharmacist if you have questions. COMMON BRAND NAME(S): PROAMP What should I tell my care team before I take this medication? They need to know if you have any of these conditions: Addison disease Dehydration Diabetes (high blood sugar) Heart disease High levels of potassium in the blood Irregular heartbeat or rhythm Kidney disease Large areas of burned skin An unusual or allergic reaction to potassium, other medications, foods, dyes, or preservatives Pregnant or trying to get pregnant Breast-feeding How should I use this medication? This medication is injected into a vein. It is given in a hospital or clinic setting. Talk to your care team about the use of this medication in children. Special care may be  needed. Overdosage: If you think you have taken too much of this medicine contact a poison control center or emergency room at once. NOTE: This medicine is only for you. Do not share this medicine with others. What if I miss a dose? This does not apply. This medication is not for regular use. What may interact with this medication? Do not take this medication with any of the following: Certain diuretics such as spironolactone, triamterene Eplerenone Sodium polystyrene sulfonate This medication may also interact with the following: Certain medications for blood pressure or heart disease like lisinopril, losartan, quinapril, valsartan Medications that lower your chance of fighting infection such as cyclosporine, tacrolimus NSAIDs, medications for pain and inflammation, like ibuprofen or naproxen Other potassium supplements Salt substitutes This list may not describe all possible interactions. Give your health care provider a list of all the medicines, herbs, non-prescription drugs, or dietary supplements you use. Also tell them if you smoke, drink alcohol, or use illegal drugs. Some items may interact with your medicine. What should I watch for while using this medication? Visit your care team for regular checks on your progress. Tell your care team if your symptoms do not start to get better or if they get worse. You may need blood work while you are taking this medication. Avoid salt substitutes unless you are told otherwise by your care team. What side effects may I notice from receiving this medication? Side effects that you should report to your care team as soon as possible: Allergic reactions--skin rash, itching, hives, swelling of the face, lips, tongue, or throat High potassium level--muscle weakness, fast or irregular heartbeat Side effects that usually do not require medical attention (report to your care team if they continue or are bothersome): Diarrhea Nausea Stomach  pain Vomiting This list may not describe all possible side effects. Call your doctor for medical advice about side effects. You may report side effects to FDA at 1-800-FDA-1088. Where should I keep my medication? This medication is given in a hospital or clinic. It will not be stored at home. NOTE: This sheet is a summary. It may not cover all possible information. If you have questions about this medicine, talk to your doctor, pharmacist, or health care provider.  2022 Elsevier/[employer] Standard (2020-04-08 00:00:00)

## 2020-12-15 NOTE — Progress Notes (Signed)
Pt remains the same. Epigastric pain, N/V. Unable to take any meds or nourishment. Will receive 20 KCL and supportive IVFs and medications.

## 2020-12-17 ENCOUNTER — Other Ambulatory Visit: Payer: Self-pay

## 2020-12-17 ENCOUNTER — Inpatient Hospital Stay: Payer: Medicare Other

## 2020-12-17 ENCOUNTER — Inpatient Hospital Stay (HOSPITAL_BASED_OUTPATIENT_CLINIC_OR_DEPARTMENT_OTHER): Payer: Medicare Other | Admitting: Obstetrics and Gynecology

## 2020-12-17 VITALS — BP 115/68 | HR 115 | Temp 97.7°F | Resp 20 | Wt 125.4 lb

## 2020-12-17 DIAGNOSIS — C541 Malignant neoplasm of endometrium: Secondary | ICD-10-CM

## 2020-12-17 DIAGNOSIS — Z95828 Presence of other vascular implants and grafts: Secondary | ICD-10-CM

## 2020-12-17 DIAGNOSIS — R599 Enlarged lymph nodes, unspecified: Secondary | ICD-10-CM

## 2020-12-17 DIAGNOSIS — R188 Other ascites: Secondary | ICD-10-CM | POA: Diagnosis not present

## 2020-12-17 DIAGNOSIS — R971 Elevated cancer antigen 125 [CA 125]: Secondary | ICD-10-CM

## 2020-12-17 DIAGNOSIS — Z5112 Encounter for antineoplastic immunotherapy: Secondary | ICD-10-CM | POA: Diagnosis not present

## 2020-12-17 MED ORDER — ONDANSETRON HCL 4 MG/2ML IJ SOLN
8.0000 mg | Freq: Once | INTRAMUSCULAR | Status: AC
  Administered 2020-12-17: 10:00:00 8 mg via INTRAVENOUS
  Filled 2020-12-17: qty 4

## 2020-12-17 MED ORDER — HEPARIN SOD (PORK) LOCK FLUSH 100 UNIT/ML IV SOLN
500.0000 [IU] | Freq: Once | INTRAVENOUS | Status: AC
  Administered 2020-12-17: 11:00:00 500 [IU] via INTRAVENOUS
  Filled 2020-12-17: qty 5

## 2020-12-17 MED ORDER — SODIUM CHLORIDE 0.9 % IV SOLN
INTRAVENOUS | Status: AC
  Filled 2020-12-17 (×2): qty 250

## 2020-12-17 MED ORDER — SODIUM CHLORIDE 0.9% FLUSH
10.0000 mL | Freq: Once | INTRAVENOUS | Status: DC
  Filled 2020-12-17: qty 10

## 2020-12-17 MED ORDER — FAMOTIDINE 20 MG IN NS 100 ML IVPB
20.0000 mg | Freq: Once | INTRAVENOUS | Status: AC
  Administered 2020-12-17: 20 mg via INTRAVENOUS
  Filled 2020-12-17: qty 20

## 2020-12-17 MED ORDER — SODIUM CHLORIDE 0.9 % IV SOLN: 8.0000 mg | Freq: Once | INTRAVENOUS | Status: DC

## 2020-12-17 NOTE — Patient Instructions (Signed)
Dr. Blake Divine office will contact you with a televisit appointment for this Friday.

## 2020-12-17 NOTE — Progress Notes (Signed)
Gynecologic Oncology Interval Visit   Referring Provider: Dr. 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 G4P3 female (h/o ruptured ectopic s/p XL) and h/o Hepatitis C cirrhosis who is seen in consultation from Dr. Rogue Bussing and Bary Castilla for elevated high grade serous cancer s/p 7 cycles carboplatin-Taxol chemotherapy followed by TLH-BSO on 03/27/20 who returns to clinic for re-evaluation. She has a medical history significant for Hepatitis C (ectopic pregnancy in 1990 requiring transfusion).   She presents today for second opinion regarding management for progression of persistent endometrial cancer. She has seen Dr. Rogue Bussing and Dr. Regenia Skeeter. Recommendation was to consider Hospice if she has progressive disease.   10/15/20 plan for levantinib and pembrolizumab. She received pembrolizumab. The plan was to start lenvatinib-however start with a lower dose 10 mg a day. However, lenvatinib was not started due to SBO symptoms.    10/13-19/22 admitted to [place] She presented to the ED at [facility] with pain, nausea, and emesis. She has had several recent admissions over the past several months for small bowel obstruction, in the setting of recent initiation of pembro/lenvatinib. General surgery was consulted in the Ed did not believe she was a surgical candidate due to extensive prior surgeries and no need for acute surgical intervention. An NG tube was placed and patient made NPO. After discussion, the decision was made to proceed with a venting g-tube for symptomatic management while continuing chemotherapy. A g-tube was placed by IR on 10/17 which the patient tolerated well, and after her diet was advanced without complications and teaching completed she was discharged home.   11/10/2020 pembrolizumab held and patient received IVFs and electrolytes.  11/17/2020 Cycle #2 pembrolizumab  12/08/2020 Cycle #3 pembrolizumab  Throughout her  pem/len treatment she has had nausea/vomitting- from intermittent SBO symptoms despite PEG decompression. She has required IVF and electrolytes on multiple occasions. She has a h/o acute renal failure: creatinine improved from 2.7.    12/08/2020 Dr. Rogue Bussing had a long discussion the patient and her husband regarding overall poor prognosis; and the fact that she was clinically progressing. Her ordered a CT scan. He discussed that if progressive disease is noted-patient is a poor candidate for any further systemic therapies; and hospice would be recommended.  IVF and electrolytes administered.  12/12/2020  She presented with epigastric pain when she tries to swallow liquids; IVFs and supportive meds given  12/12/2020 CT scan C/A/P Comparison is made with imaging from October 11, 2020.    Lungs/Pleura: Patchy middle lobe and lingular nodularity with ground-glass features. Small LEFT-sided pleural effusion and trace RIGHT-sided effusion.   Stomach/Bowel: Gastrostomy tube in place. Distortion of numerous bowel loops in the abdomen with intermittent dilation and narrowing.  Nodularity adjacent to the transverse colon is similar to previous imaging. Nodule adjacent to the splenic flexure (image 65/2) 2.8 x 1.5 cm previously 2.9 x 1.6 cm.   Incontinuity with this soft tissue is less subdiaphragmatic soft tissue measuring 2.0 cm greatest axial dimension, previously 2.0 cm. Celiac nodal enlargement (image 63/2) 12 mm, grossly similar to previous imaging. Just below the gastroesophageal junction is a mildly enlarged lymph node that shows no change measuring 8 mm (image 55/2) subtle nodularity amidst ascites in the hepatic gastric recess (image 63/2) 10 mm no measurable soft tissue in this location previously.   Other: Increase in intra-abdominal ascites, moderate increase in intra-abdominal ascites since previous imaging. Nodular thickening of peritoneal reflections and septations and ascites (image 59/2)  nodularity lateral to the stomach serves as an example of this process. Nodularity along the splenic flexure of the colon as discussed above also as an example of peritoneal/serosal disease. Suspect extension into the gastrohepatic ligament as described above.  IMPRESSION: Moderate to marked thickening of the mid and distal esophagus in the setting of hiatal hernia. Findings are suspicious for esophagitis given segmental thickening. Correlate with any symptoms that would suggest esophagitis or with any repeated nausea and vomiting.   Patchy middle lobe and lingular ground-glass nodularity most suggestive of infection, scattered areas seen elsewhere in the chest as well. In the setting of nausea and vomiting would also consider the possibility of repeated microaspiration. Correlate with respiratory symptoms and suggest attention on subsequent imaging.   Signs of peritoneal carcinomatosis with increasing intra-abdominal ascites. Suggestion of increased thickening of the gastrohepatic ligament   12/15/2020 Dr. Regenia Skeeter, NP in [place] recommended IV fluids/KCl/dexamethasone/Pepcid; olanzapine 10 mg at bedtime; recommend scheduled ondansetron; Continue Carafate/PPI. He discussed the option of focusing on her comfort and involving hospice.  Daughter asked about additional chemotherapy.  Husband stated that he did not want to "give up."  He encouraged him to speak with GYN oncology. She also encouraged patient and family to consider DNR/DNI.  She presents today for evaluation for recommendations.  She continues to eat small amounts with some vomiting. She feels comfortable with venting the G tube. She has called cancer [facility] of [place] and they recommended weekly paclitaxel.     Result Notes  Component Ref Range & Units 4 wk ago  (06/11/20) 1 mo ago  (05/28/20) 2 mo ago  (05/07/20) 2 mo ago  (04/16/20) 4 mo ago  (02/19/20) 5 mo ago  (02/05/20) 5 mo ago  (01/16/20)  Cancer Antigen (CA) 125 0.0 - 38.1  U/mL 42.5 High   43.3 High  CM  59.0 High  CM  111.0 High  CM  54.6 High  CM  43.8 High  CM  72.3 High  CM        Gynecologic Oncology History Jeanette Allen is a pleasant female who is seen in consultation from Dr. Charlynn Grimes and Bary Castilla for elevated CA125 and ascites. She has a medical history significant for Hepatitis C (ectopic pregnancy in 1990 requiring transfusion).  Her history is as follows:  She presented with abdominal pain for 2-3 weeks, daily nausea (episode of emesis of CT contrast), early satiety and abdominal bloating.    08/20/2019 US Abdomen  Cirrhotic liver with small amount of perihepatic ascites. Multiple peripancreatic and perihepatic hypoechoic nodules, the largest of which measures 2.0 cm at the inferior liver edge. Recommend CT for further evaluation.   08/22/2019 The patient underwent a contrast-enhanced CT. This described possibility of gas around the gallbladder. Cholecystitis is a consideration.  Extensive abdominal and pelvic ascites. No retroperitoneal masses. No evidence of an acute inflammatory process. Suspected uterine fibroid. No free air.   08/23/2019 She saw Dr. Bary Castilla for evaluation given gallbladder findings.   Labs: elevated hepatitis C virus antibody. Liver function with elevated transaminases. Normal total bilirubin, albumin and electrolytes. Normal renal function. CBC showed a hemoglobin of 15.6 with an MCV of 95, white blood cell count of 6500 with normal differential. Hemoglobin A1c 5.5.  CA125= 1228; CEA= 2.1; alpha-fetoprotein = 2,2, and CA19-9 <2.  08/27/2019 Paracentesis total of approximately 1250 mL of clear, amber colored fluid was Removed. LDH 334; ANC42; total nucleated cell 3,035; No WBC; No organisms; No growth; Albumin 2.8; total protein 5.0; Glucose  98. Cytology- POSITIVE FOR MALIGNANCY. - COMPATIBLE WITH HIGH-GRADE SEROUS CARCINOMA.   Attempted endometrial biopsy but procedure aborted due to cervical stenosis.   09/07/2019 biopsy of  peritoneal mass DIAGNOSIS:  A. PERITONEAL MASS, LEFT LATERAL; CT-GUIDED CORE BIOPSY:  - METASTATIC HIGH-GRADE SEROUS CARCINOMA.   09/12/2019 DUODENAL BULB; COLD BIOPSY DIAGNOSIS:  A. DUODENAL BULB; COLD BIOPSY:  - DUODENAL MUCOSA DISPLAYING NORMAL VILLOUS ARCHITECTURE AND MILD  BRUNNER GLAND HYPERPLASIA.  - NEGATIVE FOR FEATURES OF CELIAC, DYSPLASIA, AND MALIGNANCY.   PET 09/13/2019 IMPRESSION: 1. Diffuse and extensive peritoneal carcinomatosis as detailed above. The uterus and the sigmoid colon are abnormal and the primary neoplasm could be the sigmoid colon or possibly endometrial carcinoma. Given their close proximity it is also possible that this is sigmoid colon cancer invading the uterus or less likely vice versa. Recommend omental biopsy or sigmoidoscopy. 2. Single left upper lobe pulmonary nodule is mildly hypermetabolic and could reflect metastatic disease.  Given extensive nature of disease recommendation was for NACT.   09/14/19- Cycle 1  arboplatin/Taxol CA125 1,501 10/05/19- Cycle 2  CA125 1,196  10/11/19- US Venous Bilateral for leg pain post chemotherapy was negative for DVT  10/26/19- Cycle 3 CA125 614  11/06/2019- CT for restaging- Decreased ascites and decreased size of bulky omental disease and areas of peritoneal disease. Omental disease remains bulky as described. Mild to moderate improvement in upper abdominal adenopathy. Particularly with respect to gastrohepatic implants or lymph nodes that were seen previously. Resolution of LEFT- sided pleural fluid. Subjective decrease in mesenteric thickening. New signs of disease though smaller areas would be difficult to detect given the extensive nature of preexistent disease. No evidence of metastatic disease to the chest. Aortic atherosclerosis. Mild distension of distal small bowel in the setting of peritoneal disease with mild angulation of some small bowel loops.   11/16/19- Cycle 4 CA125 260  On 12/13/2019 she underwent EUA,  diagnostic laparoscopy and D&C.   Upon diagnostic laparoscopy the Fagotti score was calculated based on: Tumor diffusion along the omentum up to the large stomach curvature score 2 Massive peritoneal involvement and/or miliary pattern of distribution of peritoneal carcinomatosis score 2 Wide spread infiltrating carcinomatosis and/or confluent nodules to the majority of the diaphragmatic surface absent, score 0 Large infiltrating nodules and/or an involvement of the root of the mesentery supposed on the basis of limited movements of the various intestinal segments 2 Possible large/small bowel resection (excluding recto-sigmoid resection) and/or extended carcinomatosis on the ansae score 2 Obvious neoplastic involvement of the gastric wall score 2 Liver surface lesions larger than 2 cm score 0   Total score: 10  A.  Endometrium, curettage:   Scant atypical cells present suspicious but not diagnostic of malignancy, A1-4    WT-1   Negative A1-5    P16     Patchy positive A1-6    P53     Positive/abnormal in atypical cells   These findings are suspicious but not diagnostic of malignancy.  Evaluation is limited by scant cellularity.    We recommended additional chemotherapy.  12/26/2019 Cycle #5 CA125 99.3 01/29/2020 Cycle #6 Dose reduction carboplatin AUC- 5 secondary thrombocytopenia at cycle #6.  CA125 72.3 02/05/20 CA125 43.8 02/19/2020 she underwent cycle #7 of chemotherapy. AST/ALT-G-1-2; secondary to Taxol. CA125 on 02/19/20 54.6   02/29/2020 PET scan CHEST: Previous described LEFT upper lobe nodule is no longer identified. Hypermetabolic mediastinal lymph nodes.   ABDOMEN/PELVIS: Dramatic reduction in multifocal hypermetabolic peritoneal carcinomatosis with near complete resolution of the previously  intensely radiotracer avid and extensive peritoneal carcinomatosis.   Two foci of hypermetabolic of peritoneal tissue remain. LEFT upper quadrant tubular thickening measuring 3.2 by 1.2 cm  with SUV max equal 4.5. This is reduced significantly in size from bulky peritoneal mass with SUV max equal 9.6 on prior.   Second focus of radiotracer activity along the inferior margin of the LEFT hepatic lobe just beneath the falciform ligament with SUV max equal 5.1. This activity is more difficult to place and presumed along the surface of the liver but cannot fully exclude physiologic activity in adjacent bowel.   Resolution of the previously seen bulky hypermetabolic tissue associated with the sigmoid colon. Near complete resolution of the metabolic activity associated with the uterus. Mild activity remain the uterus which is nonspecific (SUV max equal 3.8 on image 75).   IMPRESSION: 1. Dramatic positive response to therapy with near complete resolution of the hypermetabolic peritoneal metastasis. 2. Tubular focus of hypermetabolic peritoneal thickening in the LEFT upper quadrant does remain and has moderate metabolic activity decreased from bulky intense radiotracer activity on prior. 3. A second focus of metabolic activity along the inferior margin LEFT hepatic lobe may also represent mild residual disease. 4. Resolution of metabolic activity within the uterus and sigmoid colon. Mild residual activity may be physiologic. 5. Resolution of intraperitoneal free fluid. 6. Resolution of LEFT upper lobe pulmonary nodule.  03/27/20 - TAH- BSO, omentectomy, sigmoid bowel resection with loop ileostomy and colonic mucous fistula by Dr. Theora Gianotti. Pathology: Endometrial adenocarcinoma (3.0 cm), serous type. Tumor invades the full thickness of the myometrium to involve the uterine serosa (1.9 of 1.9 cm). Right ovary: Metastatic adenocarcinoma, involving the ovarian surface and outer parenchyma. Left ovary: Metastatic adenocarcinoma, involving the ovarian surface and outer parenchyma.  04/16/20 She resumed chemotherapy with paclitaxel and carboplatin, cycle #8. Received 10 cycles - last treatment 06/11/2020.  Carboplatin AUC 5; Taxol decrease dose to 80%.  06/11/2020 She received a total of10 cycles - last treatment was on 06/11/2020  CT 07/09/2020 IMPRESSION: Status post hysterectomy, bilateral salpingo oophorectomy, and omentectomy. Status post left hemicolectomy.  Mild peritoneal disease in the left upper abdomen, difficult to distinguish from adjacent loops of colon, but grossly unchanged. No abdominopelvic ascites.  No evidence of metastatic disease in the abdomen/pelvis.   Genetic testing - [facility] MyRisk was negative.   Negative germline genetic testing. No pathogenic variants identified on the [facility] panel. The report date is 10/01/2019. HRD testing ([employer]) still pending.    The [facility] gene panel offered by [employer] includes sequencing and deletion/duplication testing of the following 35 genes: APC, ATM, AXIN2, BARD1, BMPR1A, BRCA1, BRCA2, BRIP1, CHD1, CDK4, CDKN2A, CHEK2, EPCAM (large rearrangement only), HOXB13, GALNT12, MLH1, MSH2, MSH3, MSH6, MUTYH, NBN, NTHL1, PALB2, PMS2, PTEN, RAD51C, RAD51D, RNF43, RPS20, SMAD4, STK11, and TP53. Sequencing was performed for select regions of POLE and POLD1, and large rearrangement analysis was performed for select regions of GREM1.     Tumor testing -  ATM and T53 alterations; ESRI-CCDC170 fusion Omn[employer]. Insufficient tissue from biopsy for HRD testing. Opted for [facility] upfront. Post surgery, pathology consistent with endometrial cancer. 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/26/2019   Goals of care, counseling/discussion 09/04/2019   Hepatitis C infection 08/29/2019   CAD (coronary artery disease) 08/29/2019   Depression 08/29/2019   Esophageal reflux 08/29/2019   Hyperlipidemia 08/29/2019   Hypertension 08/29/2019   Nonrheumatic mitral valve disorder 08/29/2019   Vitamin B12 deficiency 08/29/2019   Vitamin D deficiency 08/29/2019   Acute hepatitis C 08/13/2019   DDD (degenerative disc disease), lumbar 08/02/2018   Elevation of level of transaminase and lactic acid dehydrogenase (LDH) 02/18/2017    Past Medical History: Past Medical History:  Diagnosis Date   Arthritis    Cancer ([place])    Hypertension    Past Surgical History: Past Surgical History:  Procedure Laterality Date   APPENDECTOMY     COLONOSCOPY WITH PROPOFOL N/A 10/01/2019   Procedure: COLONOSCOPY WITH PROPOFOL;  Surgeon: Lin Landsman, MD;  Location: [facility] ENDOSCOPY;  Service: Gastroenterology;  Laterality: N/A;   COLOSTOMY Right 2021   ESOPHAGOGASTRODUODENOSCOPY (EGD) WITH PROPOFOL N/A 09/12/2019   Procedure: ESOPHAGOGASTRODUODENOSCOPY (EGD) WITH PROPOFOL;  Surgeon: Lin Landsman, MD;  Location: [facility];  Service: Gastroenterology;  Laterality: N/A;   GASTROSTOMY TUBE PLACEMENT Left 10/2020   IR IMAGING GUIDED PORT INSERTION  11/21/2020  colonoscopy 2011 Tubal pregnancy 1990 Tubal reversal  Past Gynecologic History:  Menarche: age 51 Last menstrual period: age 31  OB History:  OB History  Gravida Para Term Preterm AB Living  _0 SAB IAB Ectopic Multiple Live Births      1        # Outcome Date GA Lbr Len/2nd Weight Sex Delivery Anes PTL Lv  4 Ectopic           3 Para           2 Para           1 Para             Family History: Family History  Problem Relation Age of Onset   Diabetes Maternal Grandmother    Hypertension  Maternal Grandmother    Stroke Maternal Grandmother    Immunization History  Administered Date(s) Administered   Fluad Quad(high Dose 65+) 10/26/2019, 10/22/2020   [place] Sars-Covid-2 Vaccination 05/24/2019, 06/21/2019   Social History: Social History   Socioeconomic History   Marital status: Married    Spouse name: Annie Main   Number of children: 3   Years of education: Not on file   Highest education level: Not on file  Occupational History   Not on file  Tobacco Use   Smoking status: Former    Types: Cigarettes   Smokeless tobacco: Never  Vaping Use   Vaping Use: Never used  Substance and Sexual Activity   Alcohol use: Not Currently   Drug use: Never   Sexual activity: Yes  Other Topics Concern   Not on file  Social History Narrative   Lives with husband; near [place]. Worked Network engineer at [employer]; smoke 1/2 ppd/slowed; no alcohol; 3 children [2 boys; one girl- [employer]]   Social Determinants of Health   Financial Resource Strain: Not on file  Food Insecurity: Not on file  Transportation Needs: Not on file  Physical Activity: Not on file  Stress: Not on file  Social Connections: Not on file  Intimate Partner Violence: Not on file   Immunization History  Administered Date(s) Administered   Fluad Quad(high Dose 65+) 10/26/2019, 10/22/2020   [place] Sars-Covid-2 Vaccination 05/24/2019, 06/21/2019   Allergies: No Known Allergies  Current Medications: Current Outpatient Medications  Medication Sig Dispense Refill   acetaminophen (TYLENOL) 500 MG tablet Take 1 tablet (500 mg total) by mouth every 6 (six) hours as needed for mild pain, fever or headache (or Fever >/= 101). 30 tablet 0   ergocalciferol (VITAMIN D2) 1.25 MG (50000 UT) capsule Take 50,000 Units by mouth every Monday.     FLUoxetine (PROZAC) 40 MG capsule TAKE 1 CAPSULE  (40 MG TOTAL ) BY MOUTH DAILY 30 capsule 3   lidocaine-prilocaine (EMLA) cream Apply 1 application topically as needed.  Apply to port and cover with saran wrap 1-2 hours prior to port access 30 g 1   LORazepam (ATIVAN) 0.5 MG tablet Take 1 tablet (0.5 mg total) by mouth every 8 (eight) hours as needed for anxiety. /nausea. 60 tablet 0   OLANZapine (ZYPREXA) 10 MG tablet Take 1 tablet (10 mg total) by mouth at bedtime. 30 tablet 2   omeprazole (PRILOSEC) 40 MG capsule Take 1 capsule (40 mg total) by mouth 2 (two) times daily before a meal. 60 capsule 1   ondansetron (ZOFRAN) 8 MG tablet One pill every 8 hours as needed for nausea/vomitting. 60 tablet 1   potassium chloride SA (KLOR-CON M) 20 MEQ tablet Take 2 tablets (40 mEq total) by mouth 2 (two) times daily. 30 tablet 2   sucralfate (CARAFATE) 1 GM/10ML suspension Take 10 mLs (1 g total) by mouth 4 (four) times daily -  with meals and at bedtime. 420 mL 0   traZODone (DESYREL) 50 MG tablet Take 1 tablet by mouth at bedtime as needed.     No current facility-administered medications for this visit.   Facility-Administered Medications Ordered in Other Visits  Medication Dose Route Frequency Provider Last Rate Last Admin   heparin lock flush 100 unit/mL  500 Units Intravenous Once Charlaine Dalton R, MD       sodium chloride flush (NS) 0.9 % injection 10 mL  10 mL Intravenous PRN Cammie Sickle, MD   10 mL at 12/08/20 1037   sodium chloride flush (NS) 0.9 % injection 10 mL  10 mL Intravenous Once Verlon Au, NP       Review of Systems General: fatigue, weakness  HEENT: no complaints  Lungs: dyspnea, cough o/w no complaints  Cardiac: no complaints  GI: abdominal pain, bloating, distension, n/v, G-tube in place  GU: no complaints  Musculoskeletal: back pain   Extremities: leg swelling  Skin: no complaints  Neuro: mild neuropathy intermittent  Endocrine: no complaints  Psych: feeling sad       Objective:  Physical Examination:  BP 115/68    Pulse (!) 115    Temp 97.7 F (36.5 C)    Resp 20    Wt 125 lb 6.4 oz (56.9 kg)    SpO2 100%     BMI 21.52 kg/m     ECOG Performance Status: 3  GENERAL: Ill appearing female in no acute distress HEENT:  PERRL, neck supple with midline trachea. Atraumatic normocephalic NODES:  No cervical, supraclavicular, axillary adenopathy LUNGS:  Clear to auscultation bilaterally.   HEART:  Regular rate and rhythm. ABDOMEN:  G-tube in place; ostomy RLQ with soft stool and yellow liquid. Semi-soft but slightly distended. No tense ascites, masses, or hernias. NEURO:  Nonfocal. Well oriented.  Appropriate affect.   Radiologic Imaging:  12/12/2020 CT scan  EXAM: CT CHEST, ABDOMEN, AND PELVIS WITH CONTRAST   TECHNIQUE: Multidetector CT imaging of the chest, abdomen and pelvis was performed following the standard protocol during bolus administration of intravenous contrast.   CONTRAST:  151m OMNIPAQUE IOHEXOL 300 MG/ML  SOLN   COMPARISON:  Comparison is made with imaging from October 11, 2020.   FINDINGS: CT CHEST FINDINGS   Cardiovascular: RIGHT-sided Port-A-Cath terminates in the RIGHT atrium. Calcified and noncalcified atheromatous plaque throughout the thoracic aorta without signs of aneurysmal dilation. Normal caliber of central pulmonary vasculature unremarkable on venous phase. Normal heart size. No substantial pericardial effusion.   Mediastinum/Nodes: No adenopathy in the mediastinum or bilateral hila. Thoracic inlet structures are unremarkable. No axillary lymphadenopathy. Esophagus with moderate to marked thickening of mid and distal esophagus in the setting of hiatal hernia.   Lungs/Pleura: Patchy middle lobe and lingular nodularity with ground-glass features. Small LEFT-sided pleural effusion and trace RIGHT-sided effusion. No consolidation. No suspicious pulmonary nodule.   Musculoskeletal: See below for full musculoskeletal details.   CT ABDOMEN PELVIS FINDINGS   Hepatobiliary: Hepatic steatosis. Perihepatic ascites and generalized ascites in the abdomen. No  focal, suspicious hepatic lesion. Gallbladder is collapsed. No biliary duct distension. Portal vein is patent.   Pancreas: Normal, without mass, inflammation or ductal dilatation.   Spleen: Spleen normal size and contour.   Adrenals/Urinary Tract: Adrenal glands are normal. Symmetric renal enhancement. No hydronephrosis. No nephrolithiasis. Urinary bladder is collapsed.   Stomach/Bowel: Gastrostomy tube in place. Distortion of numerous bowel loops in the abdomen with intermittent dilation and narrowing. This pattern is quite similar to previous imaging but with less pronounced dilation of bowel loops in the LEFT abdomen. Diverting ostomy in the RIGHT lower quadrant. Gradual narrowing of bowel loops with near complete collapse of bowel loops just proximal to the diverting ostomy. Complete collapse of bowel loops as expected beyond the site of the ostomy. Mild pericolonic stranding may be present. Nodularity adjacent to the transverse colon is similar to previous imaging. Nodule adjacent to the splenic flexure (image 65/2) 2.8 x 1.5 cm previously 2.9 x 1.6 cm.   Incontinuity with this soft tissue is less subdiaphragmatic soft tissue measuring 2.0 cm greatest axial dimension, previously 2.0 cm. LEFT lower quadrant colostomy with ascites now in the ostomy defect in the LEFT lower quadrant.   Vascular/Lymphatic: Atherosclerotic changes both calcified and noncalcified in the abdominal aorta. No adenopathy in the small bowel mesentery, or pelvis.   Celiac nodal enlargement (image 63/2) 12 mm, grossly similar to previous imaging. Just below the gastroesophageal junction is a mildly enlarged lymph node that shows no change measuring 8 mm (image 55/2) subtle nodularity amidst ascites in the hepatic gastric recess (image 63/2) 10 mm no measurable soft tissue in this location previously.   Reproductive: Post hysterectomy. Adjacent rectal pouch. No adnexal masses.   Other: Increase in  intra-abdominal ascites, moderate increase in intra-abdominal ascites since previous imaging. Nodular thickening of peritoneal reflections and septations and ascites (image 59/2) nodularity lateral to the stomach serves as an example of this process. Nodularity along the splenic flexure of the colon as discussed above also as an example of peritoneal/serosal disease. Suspect extension into the gastrohepatic ligament as described above.   Musculoskeletal: No acute bone finding. No destructive bone process. Spinal  degenerative changes.   IMPRESSION: Moderate to marked thickening of the mid and distal esophagus in the setting of hiatal hernia. Findings are suspicious for esophagitis given segmental thickening. Correlate with any symptoms that would suggest esophagitis or with any repeated nausea and vomiting.   Patchy middle lobe and lingular ground-glass nodularity most suggestive of infection, scattered areas seen elsewhere in the chest as well. In the setting of nausea and vomiting would also consider the possibility of repeated microaspiration. Correlate with respiratory symptoms and suggest attention on subsequent imaging.   Signs of peritoneal carcinomatosis with increasing intra-abdominal ascites. Suggestion of increased thickening of the gastrohepatic ligament   Improved dilation of small bowel since October still with transition in the lower abdomen proximal to the site of diverting ostomy, likely complete versus is high-grade partial small bowel obstruction. Correlate with ostomy output.   Interval placement of venting gastrostomy tube.   Interval development of small LEFT and trace RIGHT pleural effusions.   Aortic atherosclerosis.   Aortic Atherosclerosis (ICD10-I70.0).    Assessment:  Jeanette Allen is a 65 y.o. 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 PET  imaging concerning for endometrial primary. EMBx could not be performed due to cervical stenosis, but curettage performed in the OR on 12/13/2019 and concerning but not diagnostic of malignancy. S/p 7 cycles paclitaxel/carboplatin with evidence of response based in CA125 and PET. Underwent TAH, BSO, RS resection, loop ileostomy, mucous fistula 3/22.  A diverting loop ileostomy done given concern for possible large bowel obstruction at the levels of the cecum and the transverse colon; and a mucous fistula.  Cancer was involving uterus, both ovaries and sigmoid mesentery and there was residual disease.  Clearly is an endometrial high grade serous primary. 04/16/20 She resumed chemotherapy with paclitaxel and carboplatin, cycle #8. Received 10 cycles - last treatment 06/11/2020. Carboplatin AUC 5; Taxol decrease dose to 80%  11/2020 recommended for pembrolizumab/lenvatinib, but only got one cycle and admitted to [facility] for partial SBO and got venting G tube placed. Lenvatinib never started.  12/08/2020 Cycle #3 pembrolizumab administered. She has clinically progressive disease and worsening performance status. CT scan is stable based on measurable disease but demonstrates increase in non-target lesions c/w progression. She requires IV fluids support.   History of AKI, creatinine improved to 1.0  Electrolyte imbalances  HER2 negative, MSS  Pulmonary nodule, resolved. Stable irregular focal cystic change in the right lower lobe (series 3/image 91), unchanged  Hepatitis C  Esophagitis.  Medical co-morbidities complicating care: HTN and prior abdominal surgery and Hepatitis C.  Plan:   Problem List Items Addressed This Visit       Genitourinary   Endometrial cancer ([place]) - Primary   Other Visit Diagnoses     Elevated CA-125       Adenopathy       Other ascites          She is aware that her cancer is most likely incurable, prognosis is poor, and important goals of care. We discussed quality of  life versus trying another therapy to attempt cancer control. She wants to do everything possible to have better cancer control and extend survival. I discussed that additional therapy does not mean she will live longer; and side effects from therapy can be debilitating and minimize quality of life. Given her performance status and requirements for IV fluids/electrolyte supplementation I recommended comfort care measures and Hospice. She has spoken with  someone from Hospice.  She has had similar discussions with Dr Rogue Bussing and Dr. Regenia Skeeter.  She would like a third opinion. She has called [facility] and they recommended paclitaxel administered weekly. The other option I provided was Doxil - response rates were ~10% (Muggia et al). But I do not recommend treatment. I will try to arrange a referral to [facility] for another opinion.   Continue to follow up with Dr Rogue Bussing and with Dr. Regenia Skeeter for palliative care.   Hepatitis C. Continue to follow LFTs.   Esophagitis - follow up with her GI physician. We will message them regarding esophagitis.   I personally had a face to face interaction and evaluated the patient jointly with the NP, Ms. Beckey Rutter.  I have reviewed her history and available records and have performed the key portions of the physical exam including general, HEENT abdominal exam with my findings confirming those documented above by the APP.  I have discussed the case with the APP and the patient.  I agree with the above documentation, assessment and plan which was fully formulated by me.  Counseling was completed by me.   A total of 60 minutes were spent with the patient/family today; >50% was spent in education, counseling and coordination of care for endometrial cancer.  I personally saw the patient and performed a substantive portion of this encounter in conjunction with the listed APP as documented above.  Keelyn Fjelstad Gaetana Michaelis, MD

## 2020-12-19 ENCOUNTER — Other Ambulatory Visit: Payer: Self-pay

## 2020-12-19 ENCOUNTER — Inpatient Hospital Stay: Payer: Medicare Other

## 2020-12-19 VITALS — BP 114/69 | HR 99 | Temp 96.9°F | Resp 18

## 2020-12-19 DIAGNOSIS — K219 Gastro-esophageal reflux disease without esophagitis: Secondary | ICD-10-CM

## 2020-12-19 DIAGNOSIS — Z5112 Encounter for antineoplastic immunotherapy: Secondary | ICD-10-CM | POA: Diagnosis not present

## 2020-12-19 DIAGNOSIS — E876 Hypokalemia: Secondary | ICD-10-CM

## 2020-12-19 DIAGNOSIS — C541 Malignant neoplasm of endometrium: Secondary | ICD-10-CM

## 2020-12-19 DIAGNOSIS — E860 Dehydration: Secondary | ICD-10-CM

## 2020-12-19 LAB — MAGNESIUM: Magnesium: 1.8 mg/dL (ref 1.7–2.4)

## 2020-12-19 LAB — BASIC METABOLIC PANEL
Anion gap: 10 (ref 5–15)
BUN: 17 mg/dL (ref 8–23)
CO2: 23 mmol/L (ref 22–32)
Calcium: 9.2 mg/dL (ref 8.9–10.3)
Chloride: 101 mmol/L (ref 98–111)
Creatinine, Ser: 0.95 mg/dL (ref 0.44–1.00)
GFR, Estimated: >60 mL/min (ref >60)
Glucose, Bld: 117 mg/dL — ABNORMAL HIGH (ref 70–99)
Potassium: 3.2 mmol/L — ABNORMAL LOW (ref 3.5–5.1)
Sodium: 134 mmol/L — ABNORMAL LOW (ref 135–145)

## 2020-12-19 MED ORDER — DEXAMETHASONE SODIUM PHOSPHATE 10 MG/ML IJ SOLN
10.0000 mg | Freq: Once | INTRAMUSCULAR | Status: AC
  Administered 2020-12-19: 10 mg via INTRAVENOUS
  Filled 2020-12-19: qty 1

## 2020-12-19 MED ORDER — SODIUM CHLORIDE 0.9 % IV SOLN: Freq: Once | INTRAVENOUS | Status: DC

## 2020-12-19 MED ORDER — SODIUM CHLORIDE 0.9% FLUSH
10.0000 mL | Freq: Once | INTRAVENOUS | Status: AC | PRN
  Administered 2020-12-19: 10 mL
  Filled 2020-12-19: qty 10

## 2020-12-19 MED ORDER — FAMOTIDINE 20 MG IN NS 100 ML IVPB
20.0000 mg | Freq: Once | INTRAVENOUS | Status: AC
  Administered 2020-12-19: 20 mg via INTRAVENOUS
  Filled 2020-12-19: qty 100
  Filled 2020-12-19: qty 20

## 2020-12-19 MED ORDER — ONDANSETRON HCL 4 MG/2ML IJ SOLN
8.0000 mg | Freq: Once | INTRAMUSCULAR | Status: AC
  Administered 2020-12-19: 8 mg via INTRAVENOUS
  Filled 2020-12-19: qty 4

## 2020-12-19 MED ORDER — POTASSIUM CHLORIDE 20 MEQ/100ML IV SOLN
20.0000 meq | Freq: Once | INTRAVENOUS | Status: AC
  Administered 2020-12-19: 20 meq via INTRAVENOUS

## 2020-12-19 MED ORDER — HEPARIN SOD (PORK) LOCK FLUSH 100 UNIT/ML IV SOLN
500.0000 [IU] | Freq: Once | INTRAVENOUS | Status: AC | PRN
  Administered 2020-12-19: 500 [IU]
  Filled 2020-12-19: qty 5

## 2020-12-19 MED ORDER — SODIUM CHLORIDE 0.9 % IV SOLN
Freq: Once | INTRAVENOUS | Status: AC
  Filled 2020-12-19: qty 250

## 2020-12-19 NOTE — Progress Notes (Signed)
Pt had some energy yesterday. Epigastric pain is improving. Pt ate some grits before clinic visit this am. Receiving IV hydration with supportive meds and some IV potassium.

## 2020-12-19 NOTE — Patient Instructions (Signed)
Potassium Chloride Injection °What is this medication? °POTASSIUM CHLORIDE (poe TASS i um KLOOR ide) prevents and treats low levels of potassium in your body. Potassium plays an important role in maintaining the health of your kidneys, heart, muscles, and nervous system. °This medicine may be used for other purposes; ask your health care provider or pharmacist if you have questions. °COMMON BRAND NAME(S): PROAMP °What should I tell my care team before I take this medication? °They need to know if you have any of these conditions: °Addison disease °Dehydration °Diabetes (high blood sugar) °Heart disease °High levels of potassium in the blood °Irregular heartbeat or rhythm °Kidney disease °Large areas of burned skin °An unusual or allergic reaction to potassium, other medications, foods, dyes, or preservatives °Pregnant or trying to get pregnant °Breast-feeding °How should I use this medication? °This medication is injected into a vein. It is given in a hospital or clinic setting. °Talk to your care team about the use of this medication in children. Special care may be needed. °Overdosage: If you think you have taken too much of this medicine contact a poison control center or emergency room at once. °NOTE: This medicine is only for you. Do not share this medicine with others. °What if I miss a dose? °This does not apply. This medication is not for regular use. °What may interact with this medication? °Do not take this medication with any of the following: °Certain diuretics such as spironolactone, triamterene °Eplerenone °Sodium polystyrene sulfonate °This medication may also interact with the following: °Certain medications for blood pressure or heart disease like lisinopril, losartan, quinapril, valsartan °Medications that lower your chance of fighting infection such as cyclosporine, tacrolimus °NSAIDs, medications for pain and inflammation, like ibuprofen or naproxen °Other potassium supplements °Salt  substitutes °This list may not describe all possible interactions. Give your health care provider a list of all the medicines, herbs, non-prescription drugs, or dietary supplements you use. Also tell them if you smoke, drink alcohol, or use illegal drugs. Some items may interact with your medicine. °What should I watch for while using this medication? °Visit your care team for regular checks on your progress. Tell your care team if your symptoms do not start to get better or if they get worse. °You may need blood work while you are taking this medication. °Avoid salt substitutes unless you are told otherwise by your care team. °What side effects may I notice from receiving this medication? °Side effects that you should report to your care team as soon as possible: °Allergic reactions--skin rash, itching, hives, swelling of the face, lips, tongue, or throat °High potassium level--muscle weakness, fast or irregular heartbeat °Side effects that usually do not require medical attention (report to your care team if they continue or are bothersome): °Diarrhea °Nausea °Stomach pain °Vomiting °This list may not describe all possible side effects. Call your doctor for medical advice about side effects. You may report side effects to FDA at 1-800-FDA-1088. °Where should I keep my medication? °This medication is given in a hospital or clinic. It will not be stored at home. °NOTE: This sheet is a summary. It may not cover all possible information. If you have questions about this medicine, talk to your doctor, pharmacist, or health care provider. °© 2022 [employer] (2020-04-08 00:00:00) ° °

## 2020-12-20 ENCOUNTER — Other Ambulatory Visit: Payer: Self-pay

## 2020-12-23 ENCOUNTER — Inpatient Hospital Stay: Payer: Medicare Other

## 2020-12-23 ENCOUNTER — Other Ambulatory Visit: Payer: Self-pay

## 2020-12-23 ENCOUNTER — Other Ambulatory Visit: Payer: Medicaid Other

## 2020-12-23 VITALS — BP 120/75 | HR 105 | Temp 97.5°F | Resp 17

## 2020-12-23 DIAGNOSIS — K219 Gastro-esophageal reflux disease without esophagitis: Secondary | ICD-10-CM

## 2020-12-23 DIAGNOSIS — E876 Hypokalemia: Secondary | ICD-10-CM

## 2020-12-23 DIAGNOSIS — C541 Malignant neoplasm of endometrium: Secondary | ICD-10-CM

## 2020-12-23 DIAGNOSIS — R110 Nausea: Secondary | ICD-10-CM

## 2020-12-23 DIAGNOSIS — Z5112 Encounter for antineoplastic immunotherapy: Secondary | ICD-10-CM | POA: Diagnosis not present

## 2020-12-23 DIAGNOSIS — E860 Dehydration: Secondary | ICD-10-CM

## 2020-12-23 LAB — BASIC METABOLIC PANEL
Anion gap: 9 (ref 5–15)
BUN: 21 mg/dL (ref 8–23)
CO2: 23 mmol/L (ref 22–32)
Calcium: 9.1 mg/dL (ref 8.9–10.3)
Chloride: 100 mmol/L (ref 98–111)
Creatinine, Ser: 0.89 mg/dL (ref 0.44–1.00)
GFR, Estimated: >60 mL/min (ref >60)
Glucose, Bld: 98 mg/dL (ref 70–99)
Potassium: 3.3 mmol/L — ABNORMAL LOW (ref 3.5–5.1)
Sodium: 132 mmol/L — ABNORMAL LOW (ref 135–145)

## 2020-12-23 MED ORDER — SODIUM CHLORIDE 0.9 % IV SOLN: Freq: Once | INTRAVENOUS | Status: DC

## 2020-12-23 MED ORDER — HEPARIN SOD (PORK) LOCK FLUSH 100 UNIT/ML IV SOLN
500.0000 [IU] | Freq: Once | INTRAVENOUS | Status: AC | PRN
  Administered 2020-12-23: 11:00:00 500 [IU]
  Filled 2020-12-23: qty 5

## 2020-12-23 MED ORDER — ONDANSETRON HCL 4 MG/2ML IJ SOLN
8.0000 mg | Freq: Once | INTRAMUSCULAR | Status: AC
  Administered 2020-12-23: 09:00:00 8 mg via INTRAVENOUS
  Filled 2020-12-23: qty 4

## 2020-12-23 MED ORDER — POTASSIUM CHLORIDE 20 MEQ/100ML IV SOLN
20.0000 meq | Freq: Once | INTRAVENOUS | Status: AC
  Administered 2020-12-23: 10:00:00 20 meq via INTRAVENOUS

## 2020-12-23 MED ORDER — SODIUM CHLORIDE 0.9% FLUSH
10.0000 mL | Freq: Once | INTRAVENOUS | Status: AC | PRN
  Administered 2020-12-23: 09:00:00 10 mL
  Filled 2020-12-23: qty 10

## 2020-12-23 MED ORDER — FAMOTIDINE 20 MG IN NS 100 ML IVPB
20.0000 mg | Freq: Once | INTRAVENOUS | Status: AC
  Administered 2020-12-23: 20 mg via INTRAVENOUS
  Filled 2020-12-23: qty 20

## 2020-12-23 MED ORDER — SODIUM CHLORIDE 0.9 % IV SOLN
Freq: Once | INTRAVENOUS | Status: AC
  Filled 2020-12-23: qty 250

## 2020-12-23 MED ORDER — DEXAMETHASONE SODIUM PHOSPHATE 10 MG/ML IJ SOLN
10.0000 mg | Freq: Once | INTRAMUSCULAR | Status: AC
  Administered 2020-12-23: 10:00:00 10 mg via INTRAVENOUS
  Filled 2020-12-23: qty 1

## 2020-12-23 NOTE — Patient Instructions (Signed)
Potassium Chloride Injection °What is this medication? °POTASSIUM CHLORIDE (poe TASS i um KLOOR ide) prevents and treats low levels of potassium in your body. Potassium plays an important role in maintaining the health of your kidneys, heart, muscles, and nervous system. °This medicine may be used for other purposes; ask your health care provider or pharmacist if you have questions. °COMMON BRAND NAME(S): PROAMP °What should I tell my care team before I take this medication? °They need to know if you have any of these conditions: °Addison disease °Dehydration °Diabetes (high blood sugar) °Heart disease °High levels of potassium in the blood °Irregular heartbeat or rhythm °Kidney disease °Large areas of burned skin °An unusual or allergic reaction to potassium, other medications, foods, dyes, or preservatives °Pregnant or trying to get pregnant °Breast-feeding °How should I use this medication? °This medication is injected into a vein. It is given in a hospital or clinic setting. °Talk to your care team about the use of this medication in children. Special care may be needed. °Overdosage: If you think you have taken too much of this medicine contact a poison control center or emergency room at once. °NOTE: This medicine is only for you. Do not share this medicine with others. °What if I miss a dose? °This does not apply. This medication is not for regular use. °What may interact with this medication? °Do not take this medication with any of the following: °Certain diuretics such as spironolactone, triamterene °Eplerenone °Sodium polystyrene sulfonate °This medication may also interact with the following: °Certain medications for blood pressure or heart disease like lisinopril, losartan, quinapril, valsartan °Medications that lower your chance of fighting infection such as cyclosporine, tacrolimus °NSAIDs, medications for pain and inflammation, like ibuprofen or naproxen °Other potassium supplements °Salt  substitutes °This list may not describe all possible interactions. Give your health care provider a list of all the medicines, herbs, non-prescription drugs, or dietary supplements you use. Also tell them if you smoke, drink alcohol, or use illegal drugs. Some items may interact with your medicine. °What should I watch for while using this medication? °Visit your care team for regular checks on your progress. Tell your care team if your symptoms do not start to get better or if they get worse. °You may need blood work while you are taking this medication. °Avoid salt substitutes unless you are told otherwise by your care team. °What side effects may I notice from receiving this medication? °Side effects that you should report to your care team as soon as possible: °Allergic reactions--skin rash, itching, hives, swelling of the face, lips, tongue, or throat °High potassium level--muscle weakness, fast or irregular heartbeat °Side effects that usually do not require medical attention (report to your care team if they continue or are bothersome): °Diarrhea °Nausea °Stomach pain °Vomiting °This list may not describe all possible side effects. Call your doctor for medical advice about side effects. You may report side effects to FDA at 1-800-FDA-1088. °Where should I keep my medication? °This medication is given in a hospital or clinic. It will not be stored at home. °NOTE: This sheet is a summary. It may not cover all possible information. If you have questions about this medicine, talk to your doctor, pharmacist, or health care provider. °© 2022 [employer] (2020-04-08 00:00:00) ° °

## 2020-12-23 NOTE — Progress Notes (Signed)
Patient remains unchanged for the most part. Pt has chronic ongoing nausea without vomiting. Still complains of epigastric pain. Has some stool output through her bag. Some fluid is vented out of her G tube.Received several supportive IV meds this am with IV potassium. Pt has an appt at [facility] on Friday for a 3rd opinion.

## 2020-12-24 ENCOUNTER — Ambulatory Visit: Payer: Medicaid Other

## 2020-12-24 ENCOUNTER — Other Ambulatory Visit: Payer: Self-pay | Admitting: *Deleted

## 2020-12-24 MED ORDER — SUCRALFATE 1 GM/10ML PO SUSP: 1.0000 g | Freq: Three times a day (TID) | ORAL | 0 refills | Status: AC

## 2020-12-30 ENCOUNTER — Inpatient Hospital Stay (HOSPITAL_BASED_OUTPATIENT_CLINIC_OR_DEPARTMENT_OTHER): Payer: Medicare Other | Admitting: Internal Medicine

## 2020-12-30 ENCOUNTER — Other Ambulatory Visit: Payer: Self-pay

## 2020-12-30 ENCOUNTER — Encounter: Payer: Self-pay | Admitting: Internal Medicine

## 2020-12-30 ENCOUNTER — Inpatient Hospital Stay: Payer: Medicare Other

## 2020-12-30 DIAGNOSIS — C541 Malignant neoplasm of endometrium: Secondary | ICD-10-CM

## 2020-12-30 DIAGNOSIS — E876 Hypokalemia: Secondary | ICD-10-CM

## 2020-12-30 DIAGNOSIS — Z5112 Encounter for antineoplastic immunotherapy: Secondary | ICD-10-CM | POA: Diagnosis not present

## 2020-12-30 LAB — CBC WITH DIFFERENTIAL/PLATELET
Abs Immature Granulocytes: 0.04 10*3/uL (ref 0.00–0.07)
Basophils Absolute: 0 10*3/uL (ref 0.0–0.1)
Basophils Relative: 0 %
Eosinophils Absolute: 0 10*3/uL (ref 0.0–0.5)
Eosinophils Relative: 0 %
HCT: 35.3 % — ABNORMAL LOW (ref 36.0–46.0)
Hemoglobin: 11.7 g/dL — ABNORMAL LOW (ref 12.0–15.0)
Immature Granulocytes: 1 %
Lymphocytes Relative: 16 %
Lymphs Abs: 1.2 10*3/uL (ref 0.7–4.0)
MCH: 34.4 pg — ABNORMAL HIGH (ref 26.0–34.0)
MCHC: 33.1 g/dL (ref 30.0–36.0)
MCV: 103.8 fL — ABNORMAL HIGH (ref 80.0–100.0)
Monocytes Absolute: 0.5 10*3/uL (ref 0.1–1.0)
Monocytes Relative: 7 %
Neutro Abs: 5.7 10*3/uL (ref 1.7–7.7)
Neutrophils Relative %: 76 %
Platelets: 129 10*3/uL — ABNORMAL LOW (ref 150–400)
RBC: 3.4 MIL/uL — ABNORMAL LOW (ref 3.87–5.11)
RDW: 16.1 % — ABNORMAL HIGH (ref 11.5–15.5)
WBC: 7.5 10*3/uL (ref 4.0–10.5)
nRBC: 0 % (ref 0.0–0.2)

## 2020-12-30 LAB — COMPREHENSIVE METABOLIC PANEL
ALT: 31 U/L (ref 0–44)
AST: 40 U/L (ref 15–41)
Albumin: 2.5 g/dL — ABNORMAL LOW (ref 3.5–5.0)
Alkaline Phosphatase: 76 U/L (ref 38–126)
Anion gap: 11 (ref 5–15)
BUN: 25 mg/dL — ABNORMAL HIGH (ref 8–23)
CO2: 23 mmol/L (ref 22–32)
Calcium: 8.7 mg/dL — ABNORMAL LOW (ref 8.9–10.3)
Chloride: 98 mmol/L (ref 98–111)
Creatinine, Ser: 0.82 mg/dL (ref 0.44–1.00)
GFR, Estimated: >60 mL/min (ref >60)
Glucose, Bld: 108 mg/dL — ABNORMAL HIGH (ref 70–99)
Potassium: 3.5 mmol/L (ref 3.5–5.1)
Sodium: 132 mmol/L — ABNORMAL LOW (ref 135–145)
Total Bilirubin: 1.1 mg/dL (ref 0.3–1.2)
Total Protein: 5.7 g/dL — ABNORMAL LOW (ref 6.5–8.1)

## 2020-12-30 MED ORDER — SODIUM CHLORIDE 0.9 % IV SOLN
Freq: Once | INTRAVENOUS | Status: AC
  Filled 2020-12-30: qty 250

## 2020-12-30 MED ORDER — HEPARIN SOD (PORK) LOCK FLUSH 100 UNIT/ML IV SOLN
INTRAVENOUS | Status: AC
  Administered 2020-12-30: 13:00:00 500 [IU]
  Filled 2020-12-30: qty 5

## 2020-12-30 NOTE — Progress Notes (Signed)
[place] NOTE  Patient Care Team: Danelle Berry, NP as PCP - General (Nurse Practitioner) Clent Jacks, RN as Oncology Nurse Navigator Cammie Sickle, MD as Consulting Physician (Internal Medicine) Gillis Ends, MD as Referring Physician (Obstetrics)  CHIEF COMPLAINTS/PURPOSE OF CONSULTATION: high grade serous cancer   #  Oncology History Overview Note  #August 2021-ascitic fluid cytology-high-grade serous carcinoma of gynecologic origin; CT scan-behind the abdominal wall 4.6 x 2.1 mass ; within the mesentery 6.7 x 4.5 cm left of mid abdomen . BIOPSY of the peritoneal mass; high grade carcinoma ca-[id number] [Dr.Byrnett]; SEP 9th 2021- PET scan-shows peritoneal carcinomatosis; uterine uptake [Dr.Secord; unable to biopsy cervical stenosis] sigmoid colonic uptake concerning for malignancy.  No ovarian uptake.  CEA 125 +1300; CEA-2.    #Hepatitis C/ectopic pregnancy/PRBC transfusion 1995-untreated.[Dr.Vanga]  # 09/14/2019-neoadjuvant CARBO-TAXOL s/p #4 cycles-partial response; DEC 9th 2021-laparoscopy-surgery aborted given the need for multiple bowel resections; DEc 22nd, 2021-proceed with neoadjuvant carbotaxol; STARTING cycle #6- AUC-5 sec to persistent thrombocytopenia.  #Status post #7 CarboTaxol -March 2022-debulking surgery TAH/BSO; bowel resections; ileostomy/mucous fistula.  # April 13th-restart CarboTaxol No. 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 GENETICS- NEG; HRD-    # PALLIATIVE CARE EVALUATION:  # PAIN MANAGEMENT:    DIAGNOSIS: Uterine cancer  STAGE:   IV      ;  GOALS: control  CURRENT/MOST RECENT THERAPY : Carbotaxol    Uterine cancer ([place]) (Resolved)  09/04/2019 Initial Diagnosis   Gynecologic cancer [facility])   Endometrial cancer ([place])  09/14/2019 - 06/12/2020 Chemotherapy   Patient is on  Treatment Plan : OVARIAN Carboplatin (AUC 6) / Paclitaxel (175) q21d x 6 cycles     10/05/2019 Initial Diagnosis   Endometrial cancer (HCC)    Genetic Testing   Negative germline genetic testing. No pathogenic variants identified on the [facility] panel. The report date is 10/01/2019. HRD testing was ordered but cancelled due to not enough sample/endometrial diagnosis.   The [facility] gene panel offered by [employer] includes sequencing and deletion/duplication testing of the following 35 genes: APC, ATM, AXIN2, BARD1, BMPR1A, BRCA1, BRCA2, BRIP1, CHD1, CDK4, CDKN2A, CHEK2, EPCAM (large rearrangement only), HOXB13, GALNT12, MLH1, MSH2, MSH3, MSH6, MUTYH, NBN, NTHL1, PALB2, PMS2, PTEN, RAD51C, RAD51D, RNF43, RPS20, SMAD4, STK11, and TP53. Sequencing was performed for select regions of POLE and POLD1, and large rearrangement analysis was performed for select regions of GREM1.    01/16/2020 Cancer Staging   Staging form: Corpus Uteri - Carcinoma and Carcinosarcoma, AJCC 8th Edition - Clinical: Stage IVB (cM1) - Signed by Cammie Sickle, MD on 01/16/2020    10/15/2020 -  Chemotherapy   Patient is on Treatment Plan : UTERINE Lenvatinib + Pembrolizumab q21d        HISTORY OF PRESENTING ILLNESS: Ambulating in a wheelchair.  Accompanied by husband.    Jeanette Allen 65 y.o.  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  S/p PEG tube/gastric decompression. -recurrent is currently on Keytruda plus [Lenvima not started sec to SBO].  In the interim patient has been evaluated by gynecology oncology Dr. Theora Gianotti and Dr. Fransisca Connors separately.  Given patient's poor performance status ongoing bowel obstruction recommend hospice.  Patient continues to have intermittent nausea with abdominal discomfort; continues to have PEG tube for decompression.  She feels fatigued.  Patient complains of chest congestion.  Review of Systems   Constitutional:  Positive for  malaise/fatigue and weight loss. Negative for chills, diaphoresis and fever.  HENT:  Negative for nosebleeds and sore throat.   Eyes:  Negative for double vision.  Respiratory:  Negative for cough, hemoptysis, sputum production, shortness of breath and wheezing.   Cardiovascular:  Negative for chest pain, palpitations, orthopnea and leg swelling.  Gastrointestinal:  Positive for abdominal pain, nausea and vomiting. Negative for blood in stool, diarrhea, heartburn and melena.  Genitourinary:  Negative for dysuria, frequency and urgency.  Musculoskeletal:  Positive for back pain and myalgias. Negative for joint pain.  Skin: Negative.  Negative for itching and rash.  Neurological:  Negative for dizziness, tingling, focal weakness, weakness and headaches.  Endo/Heme/Allergies:  Does not bruise/bleed easily.  Psychiatric/Behavioral:  Negative for depression. The patient is not nervous/anxious and does not have insomnia.     MEDICAL HISTORY:  Past Medical History:  Diagnosis Date   Arthritis    Cancer ([place])    Hypertension     SURGICAL HISTORY: Past Surgical History:  Procedure Laterality Date   APPENDECTOMY     COLONOSCOPY WITH PROPOFOL N/A 10/01/2019   Procedure: COLONOSCOPY WITH PROPOFOL;  Surgeon: Lin Landsman, MD;  Location: [facility] ENDOSCOPY;  Service: Gastroenterology;  Laterality: N/A;   COLOSTOMY Right 2021   ESOPHAGOGASTRODUODENOSCOPY (EGD) WITH PROPOFOL N/A 09/12/2019   Procedure: ESOPHAGOGASTRODUODENOSCOPY (EGD) WITH PROPOFOL;  Surgeon: Lin Landsman, MD;  Location: [facility];  Service: Gastroenterology;  Laterality: N/A;   GASTROSTOMY TUBE PLACEMENT Left 10/2020   IR IMAGING GUIDED PORT INSERTION  11/21/2020    SOCIAL HISTORY: Social History   Socioeconomic History   Marital status: Married    Spouse name: Annie Main   Number of children: 3   Years of education: Not on file   Highest education level: Not on file   Occupational History   Not on file  Tobacco Use   Smoking status: Former    Types: Cigarettes   Smokeless tobacco: Never  Vaping Use   Vaping Use: Never used  Substance and Sexual Activity   Alcohol use: Not Currently   Drug use: Never   Sexual activity: Yes  Other Topics Concern   Not on file  Social History Narrative   Lives with husband; near [place]. Worked Network engineer at [employer]; smoke 1/2 ppd/slowed; no alcohol; 3 children [2 boys; one girl- [employer]]   Social Determinants of Health   Financial Resource Strain: Not on file  Food Insecurity: Not on file  Transportation Needs: Not on file  Physical Activity: Not on file  Stress: Not on file  Social Connections: Not on file  Intimate Partner Violence: Not on file    FAMILY HISTORY: Family History  Problem Relation Age of Onset   Diabetes Maternal Grandmother    Hypertension Maternal Grandmother    Stroke Maternal Grandmother     ALLERGIES:  has No Known Allergies.  MEDICATIONS:  Current Outpatient Medications  Medication Sig Dispense Refill   acetaminophen (TYLENOL) 500 MG tablet Take 1 tablet (500 mg total) by mouth every 6 (six) hours as needed for mild pain, fever or headache (or Fever >/= 101). 30 tablet 0   ergocalciferol (VITAMIN D2) 1.25 MG (50000 UT) capsule Take 50,000 Units by mouth every Monday.     FLUoxetine (PROZAC) 40 MG capsule TAKE 1 CAPSULE  (40 MG TOTAL ) BY MOUTH DAILY 30 capsule 3   lidocaine-prilocaine (EMLA) cream Apply 1 application topically as needed. Apply to port and cover with saran wrap 1-2 hours prior to  port access 30 g 1   omeprazole (PRILOSEC) 40 MG capsule Take 1 capsule (40 mg total) by mouth 2 (two) times daily before a meal. 60 capsule 1   ondansetron (ZOFRAN) 8 MG tablet One pill every 8 hours as needed for nausea/vomitting. 60 tablet 1   sucralfate (CARAFATE) 1 GM/10ML suspension Take 10 mLs (1 g total) by mouth 4 (four) times daily -  with meals  and at bedtime. 420 mL 0   traZODone (DESYREL) 50 MG tablet Take 1 tablet by mouth at bedtime as needed.     LORazepam (ATIVAN) 0.5 MG tablet Take 1 tablet (0.5 mg total) by mouth every 8 (eight) hours as needed for anxiety. /nausea. (Patient not taking: Reported on 12/30/2020) 60 tablet 0   OLANZapine (ZYPREXA) 10 MG tablet Take 1 tablet (10 mg total) by mouth at bedtime. 30 tablet 2   potassium chloride SA (KLOR-CON M) 20 MEQ tablet Take 2 tablets (40 mEq total) by mouth 2 (two) times daily. (Patient not taking: Reported on 12/30/2020) 30 tablet 2   No current facility-administered medications for this visit.   Facility-Administered Medications Ordered in Other Visits  Medication Dose Route Frequency Provider Last Rate Last Admin   heparin lock flush 100 UNIT/ML injection            heparin lock flush 100 unit/mL  500 Units Intravenous Once Charlaine Dalton R, MD       sodium chloride flush (NS) 0.9 % injection 10 mL  10 mL Intravenous PRN Cammie Sickle, MD   10 mL at 12/08/20 1037      .  PHYSICAL EXAMINATION: ECOG PERFORMANCE STATUS: 1 - Symptomatic but completely ambulatory  Vitals:   12/30/20 1000  BP: 104/76  Pulse: (!) 139  Temp: (!) 96.7 F (35.9 C)  SpO2: 100%   Filed Weights   12/30/20 1000  Weight: 123 lb (55.8 kg)     Physical Exam Constitutional:      Comments: Ambulating independently.  Accompanied by husband.  HENT:     Head: Normocephalic and atraumatic.     Mouth/Throat:     Pharynx: No oropharyngeal exudate.  Eyes:     Pupils: Pupils are equal, round, and reactive to light.  Cardiovascular:     Rate and Rhythm: Normal rate and regular rhythm.  Pulmonary:     Effort: Pulmonary effort is normal. No respiratory distress.     Breath sounds: Normal breath sounds. No wheezing.  Abdominal:     General: Bowel sounds are normal. There is distension.     Palpations: Abdomen is soft. There is no mass.     Tenderness: There is no abdominal  tenderness. There is no guarding or rebound.  Musculoskeletal:        General: No tenderness. Normal range of motion.     Cervical back: Normal range of motion and neck supple.  Skin:    General: Skin is warm.  Neurological:     Mental Status: She is alert and oriented to person, place, and time.  Psychiatric:        Mood and Affect: Affect normal.     LABORATORY DATA:  I have reviewed the data as listed Lab Results  Component Value Date   WBC 7.5 12/30/2020   HGB 11.7 (L) 12/30/2020   HCT 35.3 (L) 12/30/2020   MCV 103.8 (H) 12/30/2020   PLT 129 (L) 12/30/2020   Recent Labs    10/11/20 1142 10/12/20 0446 12/08/20 1031 12/12/20 0912  12/15/20 0905 12/19/20 1000 12/23/20 0905 12/30/20 0910  NA 129*   < > 136 137   < > 134* 132* 132*  K 3.8   < > 3.0* 2.3*   < > 3.2* 3.3* 3.5  CL 96*   < > 96* 88*   < > 101 100 98  CO2 20*   < > 27 32   < > _0 GLUCOSE 127*   < > 118* 108*   < > 117* 98 108*  BUN 22   < > 24* 37*   < > 17 21 25*  CREATININE 1.29*   < > 1.08* 1.00   < > 0.95 0.89 0.82  CALCIUM 8.3*   < > 8.4* 8.8*   < > 9.2 9.1 8.7*  GFRNONAA 46*   < > 57* >60   < > >60 >60 >60  PROT 7.6   < > 6.5 6.5  --   --   --  5.7*  ALBUMIN 4.1   < > 3.1* 3.0*  --   --   --  2.5*  AST 21   < > 42* 85*  --   --   --  40  ALT 21   < > 32 75*  --   --   --  31  ALKPHOS 67   < > 54 64  --   --   --  76  BILITOT 1.1   < > 0.8 1.7*  --   --   --  1.1  BILIDIR 0.2  --   --   --   --   --   --   --   IBILI 0.9  --   --   --   --   --   --   --    < > = values in this interval not displayed.    RADIOGRAPHIC STUDIES: I have personally reviewed the radiological images as listed and agreed with the findings in the report. CT CHEST ABDOMEN PELVIS W CONTRAST  Result Date: 12/12/2020 CLINICAL DATA:  Metastatic workup, restaging of endometrial cancer diagnosed in July of 2021. History of total abdominal hysterectomy and systemic therapy also with history of partial colonic resection by  report. EXAM: CT CHEST, ABDOMEN, AND PELVIS WITH CONTRAST TECHNIQUE: Multidetector CT imaging of the chest, abdomen and pelvis was performed following the standard protocol during bolus administration of intravenous contrast. CONTRAST:  13m OMNIPAQUE IOHEXOL 300 MG/ML  SOLN COMPARISON:  Comparison is made with imaging from October 11, 2020. FINDINGS: CT CHEST FINDINGS Cardiovascular: RIGHT-sided Port-A-Cath terminates in the RIGHT atrium. Calcified and noncalcified atheromatous plaque throughout the thoracic aorta without signs of aneurysmal dilation. Normal caliber of central pulmonary vasculature unremarkable on venous phase. Normal heart size. No substantial pericardial effusion. Mediastinum/Nodes: No adenopathy in the mediastinum or bilateral hila. Thoracic inlet structures are unremarkable. No axillary lymphadenopathy. Esophagus with moderate to marked thickening of mid and distal esophagus in the setting of hiatal hernia. Lungs/Pleura: Patchy middle lobe and lingular nodularity with ground-glass features. Small LEFT-sided pleural effusion and trace RIGHT-sided effusion. No consolidation. No suspicious pulmonary nodule. Musculoskeletal: See below for full musculoskeletal details. CT ABDOMEN PELVIS FINDINGS Hepatobiliary: Hepatic steatosis. Perihepatic ascites and generalized ascites in the abdomen. No focal, suspicious hepatic lesion. Gallbladder is collapsed. No biliary duct distension. Portal vein is patent. Pancreas: Normal, without mass, inflammation or ductal dilatation. Spleen: Spleen normal size and contour. Adrenals/Urinary Tract: Adrenal glands are normal. Symmetric renal  enhancement. No hydronephrosis. No nephrolithiasis. Urinary bladder is collapsed. Stomach/Bowel: Gastrostomy tube in place. Distortion of numerous bowel loops in the abdomen with intermittent dilation and narrowing. This pattern is quite similar to previous imaging but with less pronounced dilation of bowel loops in the LEFT abdomen.  Diverting ostomy in the RIGHT lower quadrant. Gradual narrowing of bowel loops with near complete collapse of bowel loops just proximal to the diverting ostomy. Complete collapse of bowel loops as expected beyond the site of the ostomy. Mild pericolonic stranding may be present. Nodularity adjacent to the transverse colon is similar to previous imaging. Nodule adjacent to the splenic flexure (image 65/2) 2.8 x 1.5 cm previously 2.9 x 1.6 cm. Incontinuity with this soft tissue is less subdiaphragmatic soft tissue measuring 2.0 cm greatest axial dimension, previously 2.0 cm. LEFT lower quadrant colostomy with ascites now in the ostomy defect in the LEFT lower quadrant. Vascular/Lymphatic: Atherosclerotic changes both calcified and noncalcified in the abdominal aorta. No adenopathy in the small bowel mesentery, or pelvis. Celiac nodal enlargement (image 63/2) 12 mm, grossly similar to previous imaging. Just below the gastroesophageal junction is a mildly enlarged lymph node that shows no change measuring 8 mm (image 55/2) subtle nodularity amidst ascites in the hepatic gastric recess (image 63/2) 10 mm no measurable soft tissue in this location previously. Reproductive: Post hysterectomy. Adjacent rectal pouch. No adnexal masses. Other: Increase in intra-abdominal ascites, moderate increase in intra-abdominal ascites since previous imaging. Nodular thickening of peritoneal reflections and septations and ascites (image 59/2) nodularity lateral to the stomach serves as an example of this process. Nodularity along the splenic flexure of the colon as discussed above also as an example of peritoneal/serosal disease. Suspect extension into the gastrohepatic ligament as described above. Musculoskeletal: No acute bone finding. No destructive bone process. Spinal degenerative changes. IMPRESSION: Moderate to marked thickening of the mid and distal esophagus in the setting of hiatal hernia. Findings are suspicious for  esophagitis given segmental thickening. Correlate with any symptoms that would suggest esophagitis or with any repeated nausea and vomiting. Patchy middle lobe and lingular ground-glass nodularity most suggestive of infection, scattered areas seen elsewhere in the chest as well. In the setting of nausea and vomiting would also consider the possibility of repeated microaspiration. Correlate with respiratory symptoms and suggest attention on subsequent imaging. Signs of peritoneal carcinomatosis with increasing intra-abdominal ascites. Suggestion of increased thickening of the gastrohepatic ligament Improved dilation of small bowel since October still with transition in the lower abdomen proximal to the site of diverting ostomy, likely complete versus is high-grade partial small bowel obstruction. Correlate with ostomy output. Interval placement of venting gastrostomy tube. Interval development of small LEFT and trace RIGHT pleural effusions. Aortic atherosclerosis. Aortic Atherosclerosis (ICD10-I70.0). Electronically Signed   By: Zetta Bills M.D.   On: 12/12/2020 16:27    ASSESSMENT & PLAN:   Endometrial cancer [facility]) #Recurrent/progressive STAGE IV-High-grade serous carcinoma endometrial] pre-treatment- August 2022-PET scan no obvious evidence of any progression noted; minimal residual nodularity in the left upper quadrant without any clear metabolic uptake.  No new peritoneal disease noted.  Clinical progression noted treatment given recurrent small bowel obstruction [see below].  Currently on Keytruda; Lenvima on hold because of bowel obstruction.  Discussed/reviewed rising tumor markers.  #Currently s/p 3 cycles of s/p  Keytruda; noted to have elevated rising tumor markers/concerning for progressive disease.  Recommend discontinuation of any further Keytruda or any further chemotherapy given small bowel obstruction/poor PS.  Reviewed the recommendations from Dr. Fransisca Connors  and Dr. Theora Gianotti.  Since patient's  disease progressed while on CarboTaxol-did not see much benefit from any further Taxol chemotherapy as recommended by CT CA.  I would recommend hospice; see below  # nausea/vomitting- from intermittent SBO s/p PEG decompression-clinically  STABLE.  # Hypoklamia: Potassium 3.5 recommend continued KDur at home; continue. Marland Kitchen  #Chest congestion -recommend Claritin-D once a day; #Mucinex twice a day.  #Nausea/difficulty sleeping/anxiety- continue Ativan/ trazadone to 100 mg qhs- STABLE  # fatgiue- depression-STABLE; on prozac to 40 mg/day   # Given the incurable nature of the disease /poor tolerance of therapy and in general less than 6 months of life expectancy-I introduced hospice philosophy to the patient and family.  Discussed that goal of care should be directed to symptom management rather than treating the underlying disease; and in the process help improve quality of life rather than quantity.  Discussed  That  hospice team would include-nurse, nurse aide, social worker and chaplain for help take care of patient with physical/emotional needs. Will also reach out to the daughter.   # DISPOSITION:  # HOLD with Beryle Flock; #  IVFs over 1 lit today # hospice referral ASAP- this week # follow up as needed -Dr.B     All questions were answered. The patient knows to call the clinic with any problems, questions or concerns.   Cammie Sickle, MD 12/30/2020 12:21 PM

## 2020-12-30 NOTE — Progress Notes (Signed)
She states she feels like she is breathing heavier than usual. Coughing up thick mucus.

## 2020-12-30 NOTE — Progress Notes (Signed)
Nutrition  Patient with stage IV high grade serous endometrial cancer and peritoneal carcinomatosis.  Patient s/p neoadjuvant chemotherapy, debulking surgery, hysterectomy/BSO, left hemicolectomy with ileostomy.  Venting G-tube placed at [facility] on 10/20/20.  Palliative care following.  Noted MD recommended hospice.    Met with patient during fluids for nutrition follow-up.  Patient has not tried [employer] 1.4 shake or orgain.  Drinking ensure as able and taking a few bites.    Chart reviewed.    Patient denies having additional questions or concerns.   Patient has RD contact information.    No further follow-up.  Jeanette Allen, [place], [place] Registered Dietitian [phone] (mobile)

## 2020-12-30 NOTE — Assessment & Plan Note (Addendum)
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  No new peritoneal disease noted.  Clinical progression noted treatment given recurrent small bowel obstruction [see below].  Currently on Keytruda; Lenvima on hold because of bowel obstruction.  Discussed/reviewed rising tumor markers.  #Currently s/p 3 cycles of s/p  Keytruda; noted to have elevated rising tumor markers/concerning for progressive disease.  Recommend discontinuation of any further Keytruda or any further chemotherapy given small bowel obstruction/poor PS.  Reviewed the recommendations from Dr. Fransisca Connors and Dr. Theora Gianotti.  Since patient's disease progressed while on CarboTaxol-did not see much benefit from any further Taxol chemotherapy as recommended by CT CA.  I would recommend hospice; see below  # nausea/vomitting- from intermittent SBO s/p PEG decompression-clinically  STABLE.  # Hypoklamia: Potassium 3.5 recommend continued KDur at home; continue. Marland Kitchen  #Chest congestion -recommend Claritin-D once a day; #Mucinex twice a day.  #Nausea/difficulty sleeping/anxiety- continue Ativan/ trazadone to 100 mg qhs- STABLE  # fatgiue- depression-STABLE; on prozac to 40 mg/day   # Given the incurable nature of the disease /poor tolerance of therapy and in general less than 6 months of life expectancy-I introduced hospice philosophy to the patient and family.  Discussed that goal of care should be directed to symptom management rather than treating the underlying disease; and in the process help improve quality of life rather than quantity.  Discussed  That  hospice team would include-nurse, nurse aide, social worker and chaplain for help take care of patient with physical/emotional needs. Will also reach out to the daughter.   # DISPOSITION:  # HOLD with  Beryle Flock; #  IVFs over 1 lit today # hospice referral ASAP- this week # follow up as needed -Dr.B

## 2020-12-30 NOTE — Patient Instructions (Signed)
#  Claritin-D once a day; #Mucinex twice a day-recommend for chest congestion.

## 2020-12-31 ENCOUNTER — Encounter: Payer: Self-pay | Admitting: *Deleted

## 2020-12-31 LAB — CA 125: Cancer Antigen (CA) 125: 872 U/mL — ABNORMAL HIGH (ref 0.0–38.1)

## 2020-12-31 NOTE — Progress Notes (Signed)
Received a message that, per her daughter, this patient would like to see Dr Marin Olp for a second opinion, after yesterday's appointment, where hospice was recommended.   Reviewed her most recent office notes with Dr Marin Olp. Due to patient's current condition, and her history of treatment, Dr Marin Olp doesn't feel that he has anything additional to offer patient. He recommends a second opinion at a academic center, like [facility] or [facility], where they would have more access to clinical trials.   Notified the Navigator at [facility] to inform her of patient request and Dr Antonieta Pert response. She states the patient is already seen at [facility], and is scheduled to see another provider at [facility] for a second opinion.   Called patient's daughter, Davy Pique, and informed her of Dr Antonieta Pert response. She wonders if there is anyone at the [place] [facility] at [facility] would be willing to see her. Per Davy Pique, they are interested in trying weekly Taxol but her physicians at [place] don't prescribe it with that dosing. Message sent to [facility] navigator.   Oncology Nurse Navigator Documentation  Oncology Nurse Navigator Flowsheets 12/31/2020  Navigator Location [facility]  Navigator Encounter Type Telephone  Telephone Appt Confirmation/Clarification;Asess Navigation Needs;Incoming Call  [facility] Type -  Patient Visit Type MedOnc  Treatment Phase Other  Barriers/Navigation Needs Education  Interventions Education;Coordination of Care  Acuity Level 2-Minimal Needs (1-2 Barriers Identified)  Coordination of Care Other  Education Method Verbal  Support Groups/Services Friends and Family  Time Spent with Patient 71

## 2021-01-07 ENCOUNTER — Other Ambulatory Visit: Payer: Self-pay | Admitting: Hospice and Palliative Medicine

## 2021-01-07 MED ORDER — MORPHINE SULFATE (CONCENTRATE) 10 MG /0.5 ML PO SOLN: 5.0000 mg | ORAL | 0 refills | Status: AC | PRN

## 2021-01-07 NOTE — Progress Notes (Signed)
Hospice nurse, April, requested morphine elixir. Will send Rx to pharmacy.

## 2021-01-28 ENCOUNTER — Ambulatory Visit: Payer: Medicaid Other

## 2021-02-04 DEATH — deceased

## 2021-03-25 ENCOUNTER — Ambulatory Visit: Payer: Medicaid Other

## 2022-07-19 IMAGING — CT NM PET TUM IMG INITIAL (PI) SKULL BASE T - THIGH
1 of 8 series · 1 of 25 positions shown · non-contrast
Comparison: Outside CT scan from [facility]. This is dated
08/22/2019

CLINICAL DATA: Initial treatment strategy for peritoneal
carcinomatosis of unknown primary.

EXAM:
NUCLEAR MEDICINE PET SKULL BASE TO THIGH
TECHNIQUE: 9.06 mCi F-18 FDG was injected intravenously. Full-ring PET imaging
was performed from the skull base to thigh after the radiotracer. CT
data was obtained and used for attenuation correction and anatomic
localization.
Fasting blood glucose: 102 mg/dl

[Series 3: ct wb 5.0 b30f · axial · 5.0mm · 0.98mm/px · 1 of 290 slices shown]
[im 290/290  brain]
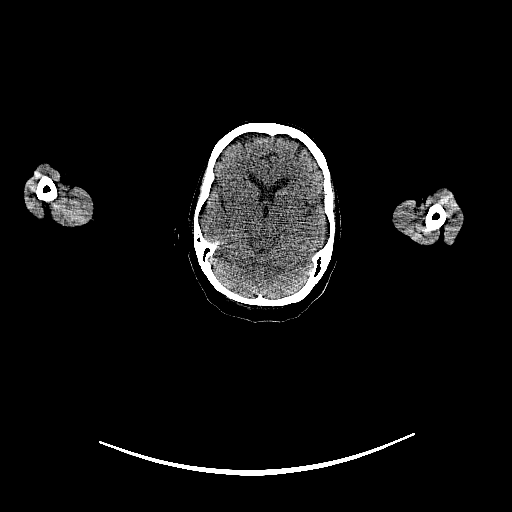

[1 of 25 positions shown; findings below may reference images not displayed]

FINDINGS: Mediastinal blood pool activity: SUV max

Liver activity: SUV max NA

NECK: No hypermetabolic lymph nodes in the neck.

Incidental CT findings: none

CHEST: No breast masses, supraclavicular or axillary adenopathy. The
thyroid gland is unremarkable.

No enlarged or hypermetabolic mediastinal or hilar lymph nodes.
Small bilateral pleural effusions are noted, left greater than
right. There is a 5 mm pulmonary nodule in the left upper lobe on
image 78/3 which shows mild hypermetabolism with SUV max of 2.53.
This could be a metastatic focus. No other definite pulmonary
lesions.

Incidental CT findings: Aortic and coronary artery calcifications
are noted

ABDOMEN/PELVIS: As demonstrated on the outside CT scan there is
diffuse peritoneal carcinomatosis with extensive peritoneal
implants, omental caking and peritoneal surface disease. Large area
of omental caking in the left upper quadrant adjacent to the
transverse colon has an SUV max of 9.6.

2.1 cm peritoneal implant just inferior to the antral region of the
stomach has an SUV max of 12.57.

Periportal adenopathy has an SUV max of 9.32.

I do not see any intrahepatic metastatic disease. Peritoneal surface
disease is noted. No pancreatic mass is identified.

The uterus is markedly abnormal with findings suspicious for
endometrial cancer. Diffuse irregular hypermetabolism noted with SUV
max of 10.64. No ovarian lesions are identified.

Area of moderate wall thickening involving the sigmoid colon
demonstrates marked hypermetabolism with SUV max of 13.58. This
could reflect the patient's primary tumor.

Incidental CT findings: Moderate volume abdominal/pelvic ascites.

SKELETON: No bone lesions are identified.

Incidental CT findings: none
IMPRESSION: 1. Diffuse and extensive peritoneal carcinomatosis as detailed
above. The uterus and the sigmoid colon are abnormal and the primary
neoplasm could be the sigmoid colon or possibly endometrial
carcinoma. Given their close proximity it is also possible that this
is sigmoid colon cancer invading the uterus or less likely vice
versa. Recommend omental biopsy or sigmoidoscopy.
2. Single left upper lobe pulmonary nodule is mildly hypermetabolic
and could reflect metastatic disease.
